# Patient Record
Sex: Female | Born: 1935 | Race: White | Hispanic: No | Smoking: Never smoker
Health system: Southern US, Community
[De-identification: ages and names within clinical notes are randomized; demographics above are authoritative.]

## PROBLEM LIST (undated history)

## (undated) DIAGNOSIS — C9000 Multiple myeloma not having achieved remission: Secondary | ICD-10-CM

## (undated) DIAGNOSIS — M5136 Other intervertebral disc degeneration, lumbar region: Secondary | ICD-10-CM

## (undated) DIAGNOSIS — M353 Polymyalgia rheumatica: Secondary | ICD-10-CM

## (undated) DIAGNOSIS — I10 Essential (primary) hypertension: Secondary | ICD-10-CM

## (undated) DIAGNOSIS — M51369 Other intervertebral disc degeneration, lumbar region without mention of lumbar back pain or lower extremity pain: Secondary | ICD-10-CM

## (undated) HISTORY — DX: Other intervertebral disc degeneration, lumbar region: M51.36

## (undated) HISTORY — DX: Essential (primary) hypertension: I10

## (undated) HISTORY — DX: Other intervertebral disc degeneration, lumbar region without mention of lumbar back pain or lower extremity pain: M51.369

## (undated) HISTORY — PX: HEMORRHOID SURGERY: SHX153

## (undated) HISTORY — DX: Multiple myeloma not having achieved remission: C90.00

## (undated) HISTORY — DX: Polymyalgia rheumatica: M35.3

---

## 2000-03-18 ENCOUNTER — Encounter: Payer: Self-pay | Admitting: Family Medicine

## 2000-03-18 ENCOUNTER — Ambulatory Visit (HOSPITAL_COMMUNITY): Admission: RE | Admit: 2000-03-18 | Discharge: 2000-03-18 | Payer: Self-pay | Admitting: Family Medicine

## 2002-03-27 ENCOUNTER — Emergency Department (HOSPITAL_COMMUNITY): Admission: EM | Admit: 2002-03-27 | Discharge: 2002-03-27 | Payer: Self-pay | Admitting: Emergency Medicine

## 2004-02-28 ENCOUNTER — Encounter: Admission: RE | Admit: 2004-02-28 | Discharge: 2004-02-28 | Payer: Self-pay | Admitting: Family Medicine

## 2005-08-22 ENCOUNTER — Ambulatory Visit (HOSPITAL_COMMUNITY): Admission: RE | Admit: 2005-08-22 | Discharge: 2005-08-22 | Payer: Self-pay | Admitting: Family Medicine

## 2006-12-10 ENCOUNTER — Ambulatory Visit (HOSPITAL_COMMUNITY): Admission: RE | Admit: 2006-12-10 | Discharge: 2006-12-10 | Payer: Self-pay | Admitting: General Surgery

## 2006-12-10 ENCOUNTER — Encounter (INDEPENDENT_AMBULATORY_CARE_PROVIDER_SITE_OTHER): Payer: Self-pay | Admitting: Specialist

## 2007-11-02 ENCOUNTER — Encounter: Admission: RE | Admit: 2007-11-02 | Discharge: 2007-11-02 | Payer: Self-pay | Admitting: Family Medicine

## 2010-02-03 ENCOUNTER — Emergency Department (HOSPITAL_COMMUNITY): Admission: EM | Admit: 2010-02-03 | Discharge: 2010-02-03 | Payer: Self-pay | Admitting: Emergency Medicine

## 2010-02-18 ENCOUNTER — Ambulatory Visit (HOSPITAL_COMMUNITY): Admission: RE | Admit: 2010-02-18 | Discharge: 2010-02-18 | Payer: Self-pay | Admitting: Family Medicine

## 2010-05-28 ENCOUNTER — Emergency Department (HOSPITAL_COMMUNITY): Admission: EM | Admit: 2010-05-28 | Discharge: 2010-05-28 | Payer: Self-pay | Admitting: Emergency Medicine

## 2010-08-16 ENCOUNTER — Emergency Department (HOSPITAL_COMMUNITY): Admission: EM | Admit: 2010-08-16 | Discharge: 2010-08-16 | Payer: Self-pay | Admitting: Emergency Medicine

## 2010-11-11 ENCOUNTER — Emergency Department (HOSPITAL_COMMUNITY)
Admission: EM | Admit: 2010-11-11 | Discharge: 2010-11-11 | Payer: Self-pay | Source: Home / Self Care | Admitting: Emergency Medicine

## 2011-01-13 ENCOUNTER — Other Ambulatory Visit: Payer: Self-pay | Admitting: Family Medicine

## 2011-01-13 DIAGNOSIS — M545 Low back pain: Secondary | ICD-10-CM

## 2011-01-13 DIAGNOSIS — R509 Fever, unspecified: Secondary | ICD-10-CM

## 2011-01-15 ENCOUNTER — Ambulatory Visit (HOSPITAL_COMMUNITY)
Admission: RE | Admit: 2011-01-15 | Discharge: 2011-01-15 | Disposition: A | Discharge status: Home / Self Care | Payer: No Typology Code available for payment source | Source: Ambulatory Visit | Attending: Family Medicine | Admitting: Family Medicine

## 2011-01-15 ENCOUNTER — Other Ambulatory Visit: Payer: Self-pay | Admitting: Family Medicine

## 2011-01-15 DIAGNOSIS — M5126 Other intervertebral disc displacement, lumbar region: Secondary | ICD-10-CM | POA: Insufficient documentation

## 2011-01-15 DIAGNOSIS — M545 Low back pain, unspecified: Secondary | ICD-10-CM | POA: Insufficient documentation

## 2011-01-15 DIAGNOSIS — M79609 Pain in unspecified limb: Secondary | ICD-10-CM | POA: Insufficient documentation

## 2011-01-15 DIAGNOSIS — M5124 Other intervertebral disc displacement, thoracic region: Secondary | ICD-10-CM | POA: Insufficient documentation

## 2011-01-15 DIAGNOSIS — R509 Fever, unspecified: Secondary | ICD-10-CM

## 2011-01-30 LAB — URINALYSIS, ROUTINE W REFLEX MICROSCOPIC
Glucose, UA: NEGATIVE mg/dL
Hgb urine dipstick: NEGATIVE
Nitrite: NEGATIVE
Specific Gravity, Urine: 1.02 (ref 1.005–1.030)

## 2011-01-30 LAB — BASIC METABOLIC PANEL
BUN: 14 mg/dL (ref 6–23)
Calcium: 9.4 mg/dL (ref 8.4–10.5)
Chloride: 102 mEq/L (ref 96–112)
Creatinine, Ser: 1.03 mg/dL (ref 0.4–1.2)
GFR calc Af Amer: >60 mL/min (ref >60)
Glucose, Bld: 116 mg/dL — ABNORMAL HIGH (ref 70–99)
Potassium: 3.2 mEq/L — ABNORMAL LOW (ref 3.5–5.1)
Sodium: 141 mEq/L (ref 135–145)

## 2011-01-30 LAB — DIFFERENTIAL
Basophils Absolute: 0 10*3/uL (ref 0.0–0.1)
Eosinophils Relative: 3 % (ref 0–5)
Lymphs Abs: 1.7 10*3/uL (ref 0.7–4.0)
Monocytes Relative: 10 % (ref 3–12)
Neutro Abs: 1.7 10*3/uL (ref 1.7–7.7)

## 2011-01-30 LAB — CBC
HCT: 35.7 % — ABNORMAL LOW (ref 36.0–46.0)
Hemoglobin: 12.2 g/dL (ref 12.0–15.0)
MCV: 91.5 fL (ref 78.0–100.0)
RBC: 3.91 MIL/uL (ref 3.87–5.11)

## 2011-01-30 LAB — HEPATIC FUNCTION PANEL
AST: 16 U/L (ref 0–37)
Alkaline Phosphatase: 71 U/L (ref 39–117)

## 2011-02-10 LAB — COMPREHENSIVE METABOLIC PANEL
ALT: 21 U/L (ref 0–35)
AST: 26 U/L (ref 0–37)
Albumin: 3.8 g/dL (ref 3.5–5.2)
Calcium: 8.9 mg/dL (ref 8.4–10.5)
Chloride: 99 mEq/L (ref 96–112)
Creatinine, Ser: 0.95 mg/dL (ref 0.4–1.2)
GFR calc Af Amer: >60 mL/min (ref >60)
GFR calc non Af Amer: 58 mL/min — ABNORMAL LOW (ref >60)
Glucose, Bld: 122 mg/dL — ABNORMAL HIGH (ref 70–99)
Potassium: 2.8 mEq/L — ABNORMAL LOW (ref 3.5–5.1)
Sodium: 138 mEq/L (ref 135–145)

## 2011-02-10 LAB — CBC
HCT: 31.4 % — ABNORMAL LOW (ref 36.0–46.0)
MCHC: 34.4 g/dL (ref 30.0–36.0)
MCV: 90.1 fL (ref 78.0–100.0)
Platelets: 186 10*3/uL (ref 150–400)
RBC: 3.48 MIL/uL — ABNORMAL LOW (ref 3.87–5.11)
RDW: 15.6 % — ABNORMAL HIGH (ref 11.5–15.5)

## 2011-02-10 LAB — DIFFERENTIAL
Eosinophils Absolute: 0.3 10*3/uL (ref 0.0–0.7)
Neutro Abs: 3 10*3/uL (ref 1.7–7.7)
Neutrophils Relative %: 53 % (ref 43–77)

## 2011-04-04 NOTE — Op Note (Signed)
Kimberly Friedman, Kimberly Friedman                ACCOUNT NO.:  [id number]   MEDICAL RECORD NO.:  [id number]          PATIENT TYPE:  AMB   LOCATION:  DAY                          FACILITY:  [facility]   PHYSICIAN:  Adolph Pollack, M.D.DATE OF BIRTH:  July 29, 1936   DATE OF PROCEDURE:  12/10/2006  DATE OF DISCHARGE:                               OPERATIVE REPORT   PREOPERATIVE DIAGNOSIS:  Hemorrhoids, internal and external.   POSTOPERATIVE DIAGNOSIS:  Hemorrhoids, internal and external.   PROCEDURE:  Hemorrhoidectomy.   SURGEON:  Adolph Pollack, M.D.   ANESTHESIA:  General plus local consisting of Marcaine and [place].   INDICATIONS:  The patient is a 75 year old female who has been suffering  from hemorrhoidal problems for about 50 years, but the symptoms have  increased.  She has been having some intermittent bleeding, but has had  none since September.  However, she has swelling and pain from the  hemorrhoids, especially after a bowel movement.  She has tried multiple  medications and this has not helped her.  She now presents for  hemorrhoidectomy.  We discussed the procedure the risks extensively  preoperatively.   TECHNIQUE:  She was seen in the holding area then brought to the  operating room, placed supine on the operating table and a general  anesthetic was administered.  She had then placed the lithotomy  position.  The perianal area was sterilely prepped and draped.  She had  diffuse hemorrhoidal disease involving the left lateral side, right  anterior lateral and right posterior lateral side as well as a little  bit of the left anterior side.  I first directed my attention to the  left lateral area.   The left lateral external hemorrhoid was grasped with hemostat and  retracted.  I could see the internal component.  I placed a stitch  through the hemorrhoidal vessel plexus internally of 3-0 chromic.  I  then marked the area of incision with electrocautery and injected the  local anesthetic solution subcutaneously.  I then made an incision  sharply around the anal mucosa and anoderm and stripped the muscle away.  The Harmonic scalpel was then used to divide the hemorrhoid and excise  it.  A few bleeding points were noted.  This was controlled with  electrocautery.  I then approximated the anal mucosa and skin with a  running locking 3-0 chromic suture.   Next, I approached the right anterolateral hemorrhoid, grasped the  external component and pulled it laterally exposing the internal  component.  I placed a stitch at the hemorrhoidal plexus  of 3-0  chromic.  I then marked the area of incision using electrocautery and  then injected local anesthetic subcutaneously.  I made a sharp incision  around the hemorrhoid then stripped the muscle away from the hemorrhoid.  I then excised the hemorrhoid using Harmonic scalpel.  Bleeding was  controlled with electrocautery and I reapproximated the anal mucosa and  the skin with a running locking 3-0 chromic suture.   Lastly, I approached the right posterior lateral hemorrhoid and grasped  its external component and retracted  it laterally exposing the internal  component.  I placed a 3-0 chromic stitch across the hemorrhoidal plexus  and tied it down.  I then marked the area of incision with  electrocautery and injected local anesthetic subcutaneously.  I began  sharp excision of the hemorrhoid then stripped the underlying muscle  away and completed the excision of the hemorrhoid using Harmonic  scalpel.  Bleeding was controlled with electrocautery.  I reapproximated  the anal mucosa and skin with a running locking 3-0 chromic suture.   I inspected the area and it looked much better.  There is still some  swelling in the left anterior lateral area, however, I felt that I had  done enough at this time.  I did not detect anal stenosis.  There is a  little bit of bleeding near the suture line which I controlled with   electrocautery and I waited and inspected this and it was hemostatic.  Gelfoam was placed in the anal canal.  I then performed a perianal block  using the local anesthetic solution.  A bulky dressing was applied.   She tolerated the procedure well without any apparent complications.  She was taken to recovery room in satisfactory condition.      Adolph Pollack, M.D.  Electronically Signed     TJR/MEDQ  D:  12/10/2006  T:  12/10/2006  Job:  [id number]

## 2012-07-22 ENCOUNTER — Ambulatory Visit (HOSPITAL_COMMUNITY)
Admission: RE | Admit: 2012-07-22 | Discharge: 2012-07-22 | Disposition: A | Discharge status: Home / Self Care | Payer: PRIVATE HEALTH INSURANCE | Source: Ambulatory Visit | Attending: Family Medicine | Admitting: Family Medicine

## 2012-07-22 ENCOUNTER — Other Ambulatory Visit: Payer: Self-pay | Admitting: Family Medicine

## 2012-07-22 DIAGNOSIS — M79609 Pain in unspecified limb: Secondary | ICD-10-CM | POA: Insufficient documentation

## 2012-07-22 DIAGNOSIS — M545 Low back pain, unspecified: Secondary | ICD-10-CM

## 2012-07-22 DIAGNOSIS — M47817 Spondylosis without myelopathy or radiculopathy, lumbosacral region: Secondary | ICD-10-CM | POA: Insufficient documentation

## 2012-07-22 DIAGNOSIS — I700 Atherosclerosis of aorta: Secondary | ICD-10-CM | POA: Insufficient documentation

## 2012-07-22 DIAGNOSIS — M4180 Other forms of scoliosis, site unspecified: Secondary | ICD-10-CM | POA: Insufficient documentation

## 2013-01-25 ENCOUNTER — Ambulatory Visit (HOSPITAL_COMMUNITY)
Admission: RE | Admit: 2013-01-25 | Discharge: 2013-01-25 | Disposition: A | Discharge status: Home / Self Care | Payer: Medicare PPO | Source: Ambulatory Visit | Attending: Physical Medicine and Rehabilitation | Admitting: Physical Medicine and Rehabilitation

## 2013-01-25 DIAGNOSIS — M6281 Muscle weakness (generalized): Secondary | ICD-10-CM | POA: Insufficient documentation

## 2013-01-25 DIAGNOSIS — R29898 Other symptoms and signs involving the musculoskeletal system: Secondary | ICD-10-CM | POA: Insufficient documentation

## 2013-01-25 DIAGNOSIS — IMO0001 Reserved for inherently not codable concepts without codable children: Secondary | ICD-10-CM | POA: Insufficient documentation

## 2013-01-25 DIAGNOSIS — R262 Difficulty in walking, not elsewhere classified: Secondary | ICD-10-CM | POA: Insufficient documentation

## 2013-01-25 NOTE — Evaluation (Signed)
Physical Therapy Evaluation  Patient Details  Name: PEACHIE BARKALOW MRN: [id number] Date of Birth: 10-21-1936  Today's Date: 01/25/2013 Time: 1400-1440 Charge:  Eval Visit#: 1 of 8  Re-eval: 02/24/13 Assessment Diagnosis: lumbar stenosis/ DDD Prior Therapy: none  Authorization: pyramid today     Authorization Visit#: 1 of 8   Past Medical History: No past medical history on file. Past Surgical History: No past surgical history on file.  Subjective Symptoms/Limitations Symptoms: Ms. Arika states that she has had LBP B that radiates down to her feet.  She states that the pain stays all day long.  What concerned her the most is that she fell due to her fall.  She states that she has been referred to try and improve her pain. How long can you sit comfortably?: Pt states her pain is not changed by sitting How long can you stand comfortably?: The patient states that she can stand for about 5-10 minutes How long can you walk comfortably?: The patient has increased pain after about 5 minutes. Pain Assessment Currently in Pain?: Yes Pain Score: 10-Worst pain ever Pain Location: Back Pain Orientation: Right    Sensation/Coordination/Flexibility/Functional Tests Functional Tests Functional Tests: Oswestry test  33/50  Assessment RLE Strength Right Hip Flexion: 4/5 Right Hip Extension: 3+/5 Right Hip ABduction: 4/5 Right Knee Flexion: 5/5 Right Knee Extension: 3/5 Right Ankle Dorsiflexion: 3+/5 LLE Strength Left Hip Flexion: 4/5 Left Hip Extension: 3+/5 Left Hip ABduction: 4/5 Left Knee Flexion: 5/5 Left Knee Extension: 3/5 Left Ankle Dorsiflexion: 3+/5 Lumbar AROM Lumbar Flexion: WFL pain going down Lumbar Extension: decreased 205 Lumbar - Right Side Bend: wfl Lumbar - Left Side Bend: wfl Lumbar - Right Rotation: wfl Lumbar - Left Rotation: wfl Palpation Palpation: tight paraspinal mm  Exercise/Treatments Mobility/Balance  Posture/Postural  Control Posture/Postural Control: Postural limitations Postural Limitations: increased kyphosis decreased lordosis.   Stretches Single Knee to Chest Stretch: 2 reps;30 seconds   Supine Ab Set: 10 reps Bridge: 10 reps    Physical Therapy Assessment and Plan PT Assessment and Plan Clinical Impression Statement: Pt with tight paraspinal mm; decreased LE strength and radicular pain that has decreased pt functioning level.  Pt will benefit from skilled PT to improve body mechanic awareness, educate in exercise to decrease pain and improve pt's quality of life. Pt will benefit from skilled therapeutic intervention in order to improve on the following deficits: Decreased activity tolerance;Decreased strength;Difficulty walking;Pain PT Plan: begin [place] next treatment to B paraspinal mm; bent knee lift, active ham stretch, and sitting heelsqueeze/toe    Goals Home Exercise Program Pt will Perform Home Exercise Program: Independently PT Short Term Goals PT Short Term Goal 1: Pain decreased to no greater than a 6/10 PT Short Term Goal 2: Pt to be able to sit for 30 min without pain. PT Long Term Goals Time to Complete Long Term Goals: 4 weeks PT Long Term Goal 1: I in advance HEP to allow strengthening to wnl PT Long Term Goal 1 - Progress: Progressing toward goal PT Long Term Goal 2: Pain no greater than a 4/10 80% of the time Long Term Goal 3: pt able to shop for 40 minutes without increased pain Long Term Goal 4: Oswestry decrreased by 10 points.  Problem List Patient Active Problem List  Diagnosis  . Bilateral leg weakness  . Difficulty in walking    PT - End of Session Activity Tolerance: Patient tolerated treatment well General Behavior During Session: [facility] for tasks performed Cognition: [facility] for tasks  performed PT Plan of Care PT Home Exercise Plan: given  GP Functional Assessment Tool Used: oswestry Functional Limitation: Mobility: Walking and moving around;Self care Self  Care Current Status [phone]): At least 60 percent but less than 80 percent impaired, limited or restricted Self Care Goal Status ([id number]): At least 20 percent but less than 40 percent impaired, limited or restricted  RUSSELL,CINDY 01/25/2013, 3:05 PM  Physician Documentation Your signature is required to indicate approval of the treatment plan as stated above.  Please sign and either send electronically or make a copy of this report for your files and return this physician signed original.   Please mark one 1.__approve of plan  2. ___approve of plan with the following conditions.   ______________________________                                                          _____________________ Physician Signature                                                                                                             Date

## 2013-01-27 ENCOUNTER — Ambulatory Visit (HOSPITAL_COMMUNITY)
Admission: RE | Admit: 2013-01-27 | Discharge: 2013-01-27 | Disposition: A | Discharge status: Home / Self Care | Payer: Medicare PPO | Source: Ambulatory Visit | Attending: Family Medicine | Admitting: Family Medicine

## 2013-01-27 NOTE — Progress Notes (Signed)
Physical Therapy Treatment Patient Details  Name: Kimberly Friedman MRN: [id number] Date of Birth: [birth year]  Today's Date: 01/27/2013 Time: 8469-6295 PT Time Calculation (min): 30 min  Visit#: 2 of 8  Re-eval: 02/24/13 Charges: Therex x 20' [place] x 8'  Authorization: pyramid today  Authorization Visit#: 2 of 8   Subjective: Symptoms/Limitations Symptoms: Pt reprots no pain currently. Pain comes and goes and sometimes reaches 10/10. Pain Assessment Currently in Pain?: No/denies   Exercise/Treatments Supine Ab Set: 10 reps Bent Knee Raise: 10 reps Bridge: 10 reps  Modalities Modalities: Ultrasound Ultrasound Ultrasound Location: Bil lumbar paraspinals Ultrasound Parameters: 8' (4' each side) 1.2 w/cm2 1 MHz continuous  Physical Therapy Assessment and Plan PT Assessment and Plan Clinical Impression Statement: Tx limited by time as pt was late for appt. Pt completes therex very slow and controlled. Pt requires multimodal cueing to facilitate core contraction with stabilization exercises. [place] completed to bil lumbar paraspinals to decrease pain and tightness. PT Plan: Begin active hamstring stretch and seated hip roll in/out next session.     Problem List Patient Active Problem List  Diagnosis  . Bilateral leg weakness  . Difficulty in walking    PT - End of Session Activity Tolerance: Patient tolerated treatment well General Behavior During Session: [facility] for tasks performed Cognition: [facility] for tasks performed  GP Functional Assessment Tool Used: Bertis Ruddy, PTA 01/27/2013, 5:11 PM

## 2013-02-01 ENCOUNTER — Ambulatory Visit (HOSPITAL_COMMUNITY): Payer: No Typology Code available for payment source | Admitting: *Deleted

## 2013-02-03 ENCOUNTER — Inpatient Hospital Stay (HOSPITAL_COMMUNITY)
Admission: RE | Admit: 2013-02-03 | Payer: No Typology Code available for payment source | Source: Ambulatory Visit | Admitting: *Deleted

## 2013-02-08 ENCOUNTER — Inpatient Hospital Stay (HOSPITAL_COMMUNITY)
Admission: RE | Admit: 2013-02-08 | Payer: No Typology Code available for payment source | Source: Ambulatory Visit | Admitting: Physical Therapy

## 2013-02-10 ENCOUNTER — Ambulatory Visit (HOSPITAL_COMMUNITY): Payer: No Typology Code available for payment source | Admitting: Physical Therapy

## 2013-02-15 ENCOUNTER — Inpatient Hospital Stay (HOSPITAL_COMMUNITY)
Admission: RE | Admit: 2013-02-15 | Payer: No Typology Code available for payment source | Source: Ambulatory Visit | Admitting: *Deleted

## 2013-02-17 ENCOUNTER — Ambulatory Visit (HOSPITAL_COMMUNITY): Payer: No Typology Code available for payment source | Admitting: Physical Therapy

## 2013-02-22 ENCOUNTER — Ambulatory Visit (HOSPITAL_COMMUNITY): Payer: Medicare PPO | Admitting: Physical Therapy

## 2013-03-21 ENCOUNTER — Encounter: Payer: Self-pay | Admitting: Family Medicine

## 2013-03-21 ENCOUNTER — Ambulatory Visit (INDEPENDENT_AMBULATORY_CARE_PROVIDER_SITE_OTHER): Payer: Medicare PPO | Admitting: Family Medicine

## 2013-03-21 VITALS — BP 136/78 | HR 74 | Temp 98.3°F | Resp 16 | Wt 164.0 lb

## 2013-03-21 DIAGNOSIS — M353 Polymyalgia rheumatica: Secondary | ICD-10-CM | POA: Insufficient documentation

## 2013-03-21 DIAGNOSIS — J309 Allergic rhinitis, unspecified: Secondary | ICD-10-CM

## 2013-03-21 DIAGNOSIS — I10 Essential (primary) hypertension: Secondary | ICD-10-CM | POA: Insufficient documentation

## 2013-03-21 DIAGNOSIS — M545 Low back pain: Secondary | ICD-10-CM

## 2013-03-21 DIAGNOSIS — R634 Abnormal weight loss: Secondary | ICD-10-CM

## 2013-03-21 DIAGNOSIS — M5136 Other intervertebral disc degeneration, lumbar region: Secondary | ICD-10-CM | POA: Insufficient documentation

## 2013-03-21 MED ORDER — FLUTICASONE PROPIONATE 50 MCG/ACT NA SUSP: 2.0000 | Freq: Every day | NASAL | Status: DC

## 2013-03-21 MED ORDER — MELOXICAM 15 MG PO TABS: 15.0000 mg | ORAL_TABLET | Freq: Every day | ORAL | Status: DC

## 2013-03-21 NOTE — Progress Notes (Signed)
Subjective:    Patient ID: Kimberly Friedman, female    DOB: 24-Jul-1936, 77 y.o.   MRN: [id number]  HPI Very pleasant 77 year old female here for followup. At her office visit in February she is complaining of pain in her lower back and radiating into both hips and thighs an MRI from 2000 also by foraminal stenosis at L4 and L5 with moderate central canal stenosis is also present at L3 and L4. I did CT the patient for PMR for 6-10 months of using prednisone.  She no longer complains of pain in her shoulders or into her buttocks. She has minimal hip pain. She has constant daily pain in her lumbar spine. Given those findings I recommended she see orthopedics for possible epidural steroid injection. They tried 2 weeks of physical therapy, but the patient's pain persists.  She's also having postnasal drip and sore throat x3 weeks. She has lost 9 pounds since I discontinued the prednisone. That is likely due to stopping the prednisone, however the patient also reports nausea times and lack of appetite. She denies any fevers or chills. Past Medical History  Diagnosis Date  . PMR (polymyalgia rheumatica)   . DDD (degenerative disc disease), lumbar   . Hypertension    No current outpatient prescriptions on file prior to visit.   No current facility-administered medications on file prior to visit.   Allergies  Allergen Reactions  . Pravastatin     Myalgia's  . Zocor (Simvastatin)     Myalgia's   History   Social History  . Marital Status: Widowed    Spouse Name: N/A    Number of Children: N/A  . Years of Education: N/A   Occupational History  . Not on file.   Social History Main Topics  . Smoking status: Never Smoker   . Smokeless tobacco: Not on file  . Alcohol Use: Not on file  . Drug Use: Not on file  . Sexually Active: Not on file   Other Topics Concern  . Not on file   Social History Narrative  . No narrative on file      Review of Systems    review of systems is  otherwise neg Objective:   Physical Exam  Constitutional: She appears well-developed and well-nourished.  HENT:  Head: Normocephalic.  Right Ear: External ear normal.  Left Ear: External ear normal.  Nose: Nose normal.  Mouth/Throat: Oropharynx is clear and moist.  Eyes: Conjunctivae are normal. Pupils are equal, round, and reactive to light.  Neck: Normal range of motion. Neck supple. No JVD present. No thyromegaly present.  Cardiovascular: Normal rate, regular rhythm and normal heart sounds.   Pulmonary/Chest: Effort normal and breath sounds normal. No respiratory distress.  Abdominal: Soft. Bowel sounds are normal. She exhibits no distension. There is no tenderness.          Assessment & Plan:  1. Loss of weight The majority of this is due to discontinuation of prednisone. We'll check some baseline labs to rule out metabolic causes of weight loss. - Basic Metabolic Panel - CBC with Differential - TSH #2 low back pain. I recommended they contact Dr. Ethelene Hal to discuss possible epidural steroid injections. The patient's widespread pain is likely due to PMR. However this is quiescent at the present time and does not require prednisone therapy. And 2 she can arrange follow with Dr. Ethelene Hal, I will give her MOBIC 15 mg by mouth daily. We discussed possible gastrointestinal side effects as well as increased cardiovascular  risk. #3 sore throat-this is likely due to postnasal drip from allergies. Prescribed Flonase 2 sprays each nostril daily. There is no evidence of infection on her exam. Followup in one to 2 weeks if no better.

## 2013-03-22 LAB — CBC WITH DIFFERENTIAL/PLATELET
Basophils Relative: 1 % (ref 0–1)
HCT: 35.7 % — ABNORMAL LOW (ref 36.0–46.0)
Lymphocytes Relative: 57 % — ABNORMAL HIGH (ref 12–46)
Lymphs Abs: 2.2 10*3/uL (ref 0.7–4.0)
MCH: 31 pg (ref 26.0–34.0)
MCHC: 33.6 g/dL (ref 30.0–36.0)
Platelets: 156 10*3/uL (ref 150–400)
WBC: 3.9 10*3/uL — ABNORMAL LOW (ref 4.0–10.5)

## 2013-03-22 LAB — TSH: TSH: 1.828 u[IU]/mL (ref 0.350–4.500)

## 2013-03-22 LAB — BASIC METABOLIC PANEL
BUN: 13 mg/dL (ref 6–23)
Chloride: 102 mEq/L (ref 96–112)
Glucose, Bld: 93 mg/dL (ref 70–99)
Sodium: 141 mEq/L (ref 135–145)

## 2013-06-21 ENCOUNTER — Telehealth: Payer: Self-pay | Admitting: Family Medicine

## 2013-06-21 MED ORDER — LISINOPRIL 40 MG PO TABS: 40.0000 mg | ORAL_TABLET | Freq: Every day | ORAL | Status: DC

## 2013-06-21 MED ORDER — HYDROCHLOROTHIAZIDE 25 MG PO TABS: 25.0000 mg | ORAL_TABLET | Freq: Every day | ORAL | Status: DC

## 2013-06-21 NOTE — Telephone Encounter (Signed)
Rx Refilled  

## 2013-07-07 ENCOUNTER — Ambulatory Visit (INDEPENDENT_AMBULATORY_CARE_PROVIDER_SITE_OTHER): Payer: Medicare PPO | Admitting: Family Medicine

## 2013-07-07 ENCOUNTER — Encounter: Payer: Self-pay | Admitting: Family Medicine

## 2013-07-07 VITALS — BP 120/78 | HR 98 | Temp 98.6°F | Resp 14 | Wt 148.0 lb

## 2013-07-07 DIAGNOSIS — R634 Abnormal weight loss: Secondary | ICD-10-CM

## 2013-07-07 DIAGNOSIS — Z1231 Encounter for screening mammogram for malignant neoplasm of breast: Secondary | ICD-10-CM

## 2013-07-07 DIAGNOSIS — R6881 Early satiety: Secondary | ICD-10-CM

## 2013-07-07 DIAGNOSIS — R000 Tachycardia, unspecified: Secondary | ICD-10-CM

## 2013-07-07 NOTE — Progress Notes (Signed)
Subjective:    Patient ID: Kimberly Friedman, female    DOB: [birth year], 77 y.o.   MRN: [id number]  HPI I last saw the patient in May 2014.  At that time she weighed 164 pounds. She has lost 16 pounds in the last 3 months. This makes 25 pounds total this year. She denies any fever, abdominal pain, chest pain, cough, shortness of breath, melena, hematochezia, dysuria or hematuria.  She reports early satiety.  She can only a few bites before she feels full. She also reports profound fatigue and weakness. She has very little energy and no stamina. However she has no other symptoms. Past Medical History  Diagnosis Date  . PMR (polymyalgia rheumatica)   . DDD (degenerative disc disease), lumbar   . Hypertension    Current Outpatient Prescriptions on File Prior to Visit  Medication Sig Dispense Refill  . fish oil-omega-3 fatty acids 1000 MG capsule Take 1 g by mouth daily.      . fluticasone (FLONASE) 50 MCG/ACT nasal spray Place 2 sprays into the nose daily.  16 g  6  . hydrochlorothiazide (HYDRODIURIL) 25 MG tablet Take 1 tablet (25 mg total) by mouth daily.  30 tablet  3  . lisinopril (PRINIVIL,ZESTRIL) 40 MG tablet Take 1 tablet (40 mg total) by mouth daily.  30 tablet  3   No current facility-administered medications on file prior to visit.   Allergies  Allergen Reactions  . Pravastatin     Myalgia's  . Zocor [Simvastatin]     Myalgia's   History   Social History  . Marital Status: Widowed    Spouse Name: N/A    Number of Children: N/A  . Years of Education: N/A   Occupational History  . Not on file.   Social History Main Topics  . Smoking status: Never Smoker   . Smokeless tobacco: Not on file  . Alcohol Use: Not on file  . Drug Use: Not on file  . Sexual Activity: Not on file   Other Topics Concern  . Not on file   Social History Narrative  . No narrative on file   No family history on file.    Review of Systems  All other systems reviewed and are  negative.       Objective:   Physical Exam  Constitutional: She is oriented to person, place, and time. She appears well-developed and well-nourished.  HENT:  Right Ear: External ear normal.  Left Ear: External ear normal.  Nose: Nose normal.  Mouth/Throat: Oropharynx is clear and moist. No oropharyngeal exudate.  Eyes: Conjunctivae and EOM are normal. Pupils are equal, round, and reactive to light. No scleral icterus.  Neck: Neck supple. No JVD present. No thyromegaly present.  Cardiovascular: Regular rhythm and normal heart sounds.  Exam reveals no gallop and no friction rub.   No murmur heard. Pulmonary/Chest: Effort normal and breath sounds normal. No respiratory distress. She has no wheezes. She has no rales. She exhibits no tenderness.  Abdominal: Soft. Bowel sounds are normal. She exhibits no distension and no mass. There is no tenderness. There is no rebound and no guarding.  Musculoskeletal: She exhibits no edema.  Lymphadenopathy:    She has no cervical adenopathy.  Neurological: She is alert and oriented to person, place, and time. No cranial nerve deficit. She exhibits normal muscle tone. Coordination normal.  Skin: Skin is warm. No rash noted. No erythema. No pallor.  Psychiatric: She has a normal mood and affect. Her  behavior is normal. Judgment and thought content normal.   patient is tachycardic with a weak palpable pulse. EKG shows sinus tachycardia at 108 beats per minute with frequent PACs and a left anterior fascicular block. There is nonspecific ST changes in 1 and aVL but is otherwise normal.         Assessment & Plan:  1. Loss of weight  Patient's exam today shows a frail weak elderly female who has lost substantial weight over the last 3 months without any specific reason. I am mildly concerned about malignancy. Obtain a CT scan of the abdomen and pelvis with contrast, chest x-ray, mammogram, CBC, CMP, TSH, sedimentation rate. - CT Abdomen Pelvis W  Contrast; Future - CBC with Differential - COMPLETE METABOLIC PANEL WITH GFR - TSH - T4, free - Sedimentation rate - DG Chest 2 View; Future - MM Digital Screening; Future  2. Early satiety This is the cause of the weight loss.. Begin with the workup listed in problem #1. If this is normal I would next proceed with a GI consult for EGD and colonoscopy - CT Abdomen Pelvis W Contrast; Future  3. Tachycardia This is likely due to dehydration and poor intake. I will check a TSH. Meanwhile have the patient stop hydrochlorothiazide.  - TSH - T4, free

## 2013-07-08 LAB — COMPLETE METABOLIC PANEL WITH GFR
ALT: 8 U/L (ref 0–35)
Albumin: 4.3 g/dL (ref 3.5–5.2)
CO2: 27 mEq/L (ref 19–32)
Calcium: 9.6 mg/dL (ref 8.4–10.5)
Chloride: 100 mEq/L (ref 96–112)
GFR, Est African American: 76 mL/min
GFR, Est Non African American: 66 mL/min
Glucose, Bld: 105 mg/dL — ABNORMAL HIGH (ref 70–99)
Sodium: 138 mEq/L (ref 135–145)
Total Protein: 7.7 g/dL (ref 6.0–8.3)

## 2013-07-08 LAB — CBC WITH DIFFERENTIAL/PLATELET
Eosinophils Absolute: 0 10*3/uL (ref 0.0–0.7)
Hemoglobin: 12.9 g/dL (ref 12.0–15.0)
Lymphocytes Relative: 54 % — ABNORMAL HIGH (ref 12–46)
Lymphs Abs: 1.9 10*3/uL (ref 0.7–4.0)
MCH: 31.2 pg (ref 26.0–34.0)
MCV: 91.3 fL (ref 78.0–100.0)
Monocytes Relative: 8 % (ref 3–12)
Neutrophils Relative %: 36 % — ABNORMAL LOW (ref 43–77)
Platelets: 151 10*3/uL (ref 150–400)
RBC: 4.14 MIL/uL (ref 3.87–5.11)
WBC: 3.4 10*3/uL — ABNORMAL LOW (ref 4.0–10.5)

## 2013-07-08 NOTE — Addendum Note (Signed)
Addended by: WILLIS, SANDY B on: 07/08/2013 08:25 AM   Modules accepted: Orders  

## 2013-07-11 ENCOUNTER — Telehealth: Payer: Self-pay | Admitting: Family Medicine

## 2013-07-11 NOTE — Telephone Encounter (Signed)
When pt was in the other day she did not really understand what Dr. Tanya Nones was saying. Her sister normally comes in but she was out of town. Can you please the sister and let her know what is going on?

## 2013-07-12 NOTE — Telephone Encounter (Signed)
Please call Philipp Deputy [phone]

## 2013-08-02 ENCOUNTER — Ambulatory Visit
Admission: RE | Admit: 2013-08-02 | Discharge: 2013-08-02 | Disposition: A | Discharge status: Home / Self Care | Payer: Medicare PPO | Source: Ambulatory Visit | Attending: Family Medicine | Admitting: Family Medicine

## 2013-08-02 DIAGNOSIS — Z1231 Encounter for screening mammogram for malignant neoplasm of breast: Secondary | ICD-10-CM

## 2013-08-02 DIAGNOSIS — R634 Abnormal weight loss: Secondary | ICD-10-CM

## 2013-08-17 ENCOUNTER — Ambulatory Visit (HOSPITAL_COMMUNITY)
Admission: RE | Admit: 2013-08-17 | Discharge: 2013-08-17 | Disposition: A | Discharge status: Home / Self Care | Payer: Medicare PPO | Source: Ambulatory Visit | Attending: Family Medicine | Admitting: Family Medicine

## 2013-08-17 DIAGNOSIS — R634 Abnormal weight loss: Secondary | ICD-10-CM

## 2013-08-17 DIAGNOSIS — K5730 Diverticulosis of large intestine without perforation or abscess without bleeding: Secondary | ICD-10-CM | POA: Insufficient documentation

## 2013-08-17 DIAGNOSIS — R6881 Early satiety: Secondary | ICD-10-CM | POA: Insufficient documentation

## 2013-08-17 DIAGNOSIS — R933 Abnormal findings on diagnostic imaging of other parts of digestive tract: Secondary | ICD-10-CM | POA: Insufficient documentation

## 2013-08-17 MED ORDER — IOHEXOL 300 MG/ML  SOLN
100.0000 mL | Freq: Once | INTRAMUSCULAR | Status: AC | PRN
  Administered 2013-08-17: 100 mL via INTRAVENOUS

## 2013-09-01 NOTE — Progress Notes (Signed)
#   not working at this time

## 2013-10-09 ENCOUNTER — Encounter: Payer: Self-pay | Admitting: Family Medicine

## 2013-12-05 ENCOUNTER — Encounter: Payer: Self-pay | Admitting: Family Medicine

## 2013-12-05 ENCOUNTER — Ambulatory Visit (INDEPENDENT_AMBULATORY_CARE_PROVIDER_SITE_OTHER): Payer: Medicare PPO | Admitting: Family Medicine

## 2013-12-05 VITALS — BP 160/90 | HR 78 | Temp 97.0°F | Resp 18 | Wt 152.0 lb

## 2013-12-05 DIAGNOSIS — IMO0001 Reserved for inherently not codable concepts without codable children: Secondary | ICD-10-CM

## 2013-12-05 DIAGNOSIS — I10 Essential (primary) hypertension: Secondary | ICD-10-CM

## 2013-12-05 DIAGNOSIS — Z23 Encounter for immunization: Secondary | ICD-10-CM

## 2013-12-05 MED ORDER — HYDROCHLOROTHIAZIDE 25 MG PO TABS: 25.0000 mg | ORAL_TABLET | Freq: Every day | ORAL | Status: DC

## 2013-12-05 MED ORDER — TRAMADOL HCL 50 MG PO TABS: 50.0000 mg | ORAL_TABLET | Freq: Four times a day (QID) | ORAL | Status: DC

## 2013-12-05 NOTE — Progress Notes (Signed)
Subjective:    Patient ID: Kimberly Friedman, female    DOB: 1936/02/19, 78 y.o.   MRN: [id number]  HPI  Patient is a very pleasant 78 year old African American female who presents today complaining of diffuse body pain. Is a very difficult history to obtain. It is hard to pin down the patient on exactly where she hurts. She continues to state and reiterate that hurts all over her body. The pain starts in her lower back and then proceeds up her back into her arms and down into her legs. The pain lasts a short period time but occurs frequently throughout the day. She has a past medical history of PMR as well as lumbar degenerative disc disease.  She received epidural steroid injections in the past which helped her low back pain. She has also taken  prednisone for PMR which seemed to help her pain in her shoulders and her hips. This pain however seems to be in her hands and her arms and her legs in her lower back. She is also supposed to be on lisinopril as well as hydrochlorothiazide for her blood pressure. She is only taking lisinopril and she takes it sporadically. She denies any chest pain, shortness of breath, dyspnea on exertion. Her blood pressure is elevated today at 160/90. Her last office visit she had lost significant weight 148 pounds. She's gained 4 pounds since the office visit. Mammogram, CT of the chest, CT the abdomen and pelvis were essentially benign. There was wall thickening in the distal colon but was otherwise normal.  She is due for a colonoscopy in 2016. Past Medical History  Diagnosis Date  . PMR (polymyalgia rheumatica)   . DDD (degenerative disc disease), lumbar   . Hypertension    No past surgical history on file. Current Outpatient Prescriptions on File Prior to Visit  Medication Sig Dispense Refill  . fish oil-omega-3 fatty acids 1000 MG capsule Take 1 g by mouth daily.      . fluticasone (FLONASE) 50 MCG/ACT nasal spray Place 2 sprays into the nose daily.  76 g  6  .  lisinopril (PRINIVIL,ZESTRIL) 40 MG tablet Take 1 tablet (40 mg total) by mouth daily.  30 tablet  3   No current facility-administered medications on file prior to visit.   Allergies  Allergen Reactions  . Pravastatin     Myalgia's  . Zocor [Simvastatin]     Myalgia's   History   Social History  . Marital Status: Widowed    Spouse Name: N/A    Number of Children: N/A  . Years of Education: N/A   Occupational History  . Not on file.   Social History Main Topics  . Smoking status: Never Smoker   . Smokeless tobacco: Not on file  . Alcohol Use: Not on file  . Drug Use: Not on file  . Sexual Activity: Not on file   Other Topics Concern  . Not on file   Social History Narrative  . No narrative on file     Review of Systems  All other systems reviewed and are negative.       Objective:   Physical Exam  Vitals reviewed. Constitutional: She appears well-developed and well-nourished. No distress.  HENT:  Mouth/Throat: Oropharynx is clear and moist.  Eyes: Conjunctivae are normal. No scleral icterus.  Neck: Neck supple. No JVD present. No thyromegaly present.  Cardiovascular: Normal rate, regular rhythm, normal heart sounds and intact distal pulses.  Exam reveals no gallop and  no friction rub.   No murmur heard. Pulmonary/Chest: Effort normal and breath sounds normal. No respiratory distress. She has no wheezes. She has no rales. She exhibits no tenderness.  Abdominal: Soft. Bowel sounds are normal. She exhibits no distension. There is no tenderness. There is no rebound and no guarding.  Musculoskeletal: She exhibits no edema and no tenderness.       Right shoulder: Normal.       Left shoulder: Normal.       Right elbow: Normal.      Left elbow: Normal.       Right knee: She exhibits normal range of motion and no effusion. No medial joint line and no lateral joint line tenderness noted.       Left knee: She exhibits normal range of motion and no effusion. No medial  joint line and no lateral joint line tenderness noted.       Lumbar back: She exhibits normal range of motion, no tenderness, no bony tenderness and no spasm.  Lymphadenopathy:    She has no cervical adenopathy.  Skin: She is not diaphoretic.          Assessment & Plan:  1. Need for prophylactic vaccination against Streptococcus pneumoniae (pneumococcus) - Pneumococcal conjugate vaccine 13-valent IM  2. HTN (hypertension) Continue lisinopril 40 mg by mouth daily. Add hydrochlorothiazide 25 mg by mouth daily. Recheck blood pressure in 2 weeks.  3. Myalgia and myositis I am unsure if the patient is complaining of low back pain with lumbar radiculopathy or possibly myalgias from PMR or possibly just simple aches and pain due to arthritis.  She is a very pleasant lady however it is difficult to obtain an accurate history to pin her down on what she means by her pain.  I will start the patient on tramadol 50 mg by mouth twice a day for the pain. I gave her 60 tablets 0 refills. I will see patient back in 2 weeks to reassess. If her pain persists on the tramadol, I would consider starting her on prednisone for possible PMR.

## 2013-12-19 ENCOUNTER — Ambulatory Visit (INDEPENDENT_AMBULATORY_CARE_PROVIDER_SITE_OTHER): Payer: Medicare PPO | Admitting: Family Medicine

## 2013-12-19 ENCOUNTER — Encounter: Payer: Self-pay | Admitting: Family Medicine

## 2013-12-19 VITALS — BP 122/82 | HR 78 | Temp 98.0°F | Resp 18 | Ht 63.0 in | Wt 149.0 lb

## 2013-12-19 DIAGNOSIS — I10 Essential (primary) hypertension: Secondary | ICD-10-CM

## 2013-12-19 DIAGNOSIS — M545 Low back pain, unspecified: Secondary | ICD-10-CM

## 2013-12-19 DIAGNOSIS — R634 Abnormal weight loss: Secondary | ICD-10-CM

## 2013-12-19 MED ORDER — HYDROCHLOROTHIAZIDE 25 MG PO TABS: 25.0000 mg | ORAL_TABLET | Freq: Every day | ORAL | Status: DC

## 2013-12-19 MED ORDER — LISINOPRIL 40 MG PO TABS: 40.0000 mg | ORAL_TABLET | Freq: Every day | ORAL | Status: DC

## 2013-12-19 NOTE — Progress Notes (Signed)
Subjective:    Patient ID: Kimberly Friedman, female    DOB: December 24, 1935, 78 y.o.   MRN: [id number]  HPI  12/05/13 Patient is a very pleasant 78 year old African American female who presents today complaining of diffuse body pain. Is a very difficult history to obtain. It is hard to pin down the patient on exactly where she hurts. She continues to state and reiterate that hurts all over her body. The pain starts in her lower back and then proceeds up her back into her arms and down into her legs. The pain lasts a short period time but occurs frequently throughout the day. She has a past medical history of PMR as well as lumbar degenerative disc disease.  She received epidural steroid injections in the past which helped her low back pain. She has also taken  prednisone for PMR which seemed to help her pain in her shoulders and her hips. This pain however seems to be in her hands and her arms and her legs in her lower back. She is also supposed to be on lisinopril as well as hydrochlorothiazide for her blood pressure. She is only taking lisinopril and she takes it sporadically. She denies any chest pain, shortness of breath, dyspnea on exertion. Her blood pressure is elevated today at 160/90. Her last office visit she had lost significant weight 148 pounds. She's gained 4 pounds since the office visit. Mammogram, CT of the chest, CT the abdomen and pelvis were essentially benign. There was wall thickening in the distal colon but was otherwise normal.  She is due for a colonoscopy in 2016.  At that time, my plan was:  At that time, my plan was: 1. Need for prophylactic vaccination against Streptococcus pneumoniae (pneumococcus) - Pneumococcal conjugate vaccine 13-valent IM  2. HTN (hypertension) Continue lisinopril 40 mg by mouth daily. Add hydrochlorothiazide 25 mg by mouth daily. Recheck blood pressure in 2 weeks.  3. Myalgia and myositis I am unsure if the patient is complaining of low back pain with  lumbar radiculopathy or possibly myalgias from PMR or possibly just simple aches and pain due to arthritis.  She is a very pleasant lady however it is difficult to obtain an accurate history to pin her down on what she means by her pain.  I will start the patient on tramadol 50 mg by mouth twice a day for the pain. I gave her 60 tablets 0 refills. I will see patient back in 2 weeks to reassess. If her pain persists on the tramadol, I would consider starting her on prednisone for possible PMR.  Patient is here today for followup. Her blood pressure is much better on the combination of lisinopril and hydrochlorothiazide. That her back is no longer bothering her. She occasionally takes a tramadol and feels much better since starting the pain medication. The pain pattern no other sounds like it may be PMR. She reports occasional pain in lower back but no pain in her hips or shoulders.  She is daily with her sister who states the patient continues to have occasional nausea, early satiety, and vomiting. I detailed workup and history of present illness at the last office visit. The next day but an ultrasound of the gallbladder as well as a GI consult for an EGD.  Past Medical History  Diagnosis Date  . PMR (polymyalgia rheumatica)   . DDD (degenerative disc disease), lumbar   . Hypertension    No past surgical history on file. Current Outpatient Prescriptions on  File Prior to Visit  Medication Sig Dispense Refill  . fish oil-omega-3 fatty acids 1000 MG capsule Take 1 g by mouth daily.      . fluticasone (FLONASE) 50 MCG/ACT nasal spray Place 2 sprays into the nose daily.  16 g  6  . traMADol (ULTRAM) 50 MG tablet Take 1 tablet (50 mg total) by mouth 4 (four) times daily.  60 tablet  1   No current facility-administered medications on file prior to visit.   Allergies  Allergen Reactions  . Pravastatin     Myalgia's  . Zocor [Simvastatin]     Myalgia's   History   Social History  . Marital  Status: Widowed    Spouse Name: N/A    Number of Children: N/A  . Years of Education: N/A   Occupational History  . Not on file.   Social History Main Topics  . Smoking status: Never Smoker   . Smokeless tobacco: Not on file  . Alcohol Use: Not on file  . Drug Use: Not on file  . Sexual Activity: Not on file   Other Topics Concern  . Not on file   Social History Narrative  . No narrative on file     Review of Systems  All other systems reviewed and are negative.       Objective:   Physical Exam  Vitals reviewed. Constitutional: She appears well-developed and well-nourished. No distress.  HENT:  Mouth/Throat: Oropharynx is clear and moist.  Eyes: Conjunctivae are normal. No scleral icterus.  Neck: Neck supple. No JVD present. No thyromegaly present.  Cardiovascular: Normal rate, regular rhythm, normal heart sounds and intact distal pulses.  Exam reveals no gallop and no friction rub.   No murmur heard. Pulmonary/Chest: Effort normal and breath sounds normal. No respiratory distress. She has no wheezes. She has no rales. She exhibits no tenderness.  Abdominal: Soft. Bowel sounds are normal. She exhibits no distension. There is no tenderness. There is no rebound and no guarding.  Musculoskeletal: She exhibits no edema and no tenderness.       Right shoulder: Normal.       Left shoulder: Normal.       Right elbow: Normal.      Left elbow: Normal.       Right knee: She exhibits normal range of motion and no effusion. No medial joint line and no lateral joint line tenderness noted.       Left knee: She exhibits normal range of motion and no effusion. No medial joint line and no lateral joint line tenderness noted.       Lumbar back: She exhibits normal range of motion, no tenderness, no bony tenderness and no spasm.  Lymphadenopathy:    She has no cervical adenopathy.  Skin: She is not diaphoretic.          Assessment & Plan:  1. HTN (hypertension) Continue  current medications at their present dosages - hydrochlorothiazide (HYDRODIURIL) 25 MG tablet; Take 1 tablet (25 mg total) by mouth daily.  Dispense: 30 tablet; Refill: 11 - lisinopril (PRINIVIL,ZESTRIL) 40 MG tablet; Take 1 tablet (40 mg total) by mouth daily.  Dispense: 30 tablet; Refill: 11  2. Loss of weight If patient continues to experience weight loss the next day would be a GI consultation for an EGD as well as a right upper quadrant ultrasound to evaluate for cholelithiasis. The patient is not having any right upper quadrant pain and therefore cholelithiasis is unlikely. Furthermore CT  scan of abdomen and pelvis do not mention any gallstones although this is not the ideal test for this. At the present time both the patient and her sister want to avoid a GI consultation and the ultrasound. They state they will call me back if symptoms worsen or persist for further evaluation.  3. Low back pain Continue to give the patient tramadol 50 mg every 6 hours as needed for low back pain.

## 2014-06-28 ENCOUNTER — Telehealth: Payer: Self-pay | Admitting: *Deleted

## 2014-06-28 NOTE — Telephone Encounter (Signed)
Received call from patient sister.   States that patient has been having increased nausea and vomiting for a few days.   States that she is not able to keep anything down, even ginger ale.   Requested MD to advise.   Call back number for Lelia [phone]

## 2014-06-28 NOTE — Telephone Encounter (Signed)
Based on her history and age I would send to ED, needs IV fluids, and anti-emetics

## 2014-06-28 NOTE — Telephone Encounter (Signed)
Call placed to patient sister Kimberly Friedman.   States that she has been able to keep some fluids down today, and she has not had any NV since this morning.   Advised to continue to encourage fluids like Gatorade or Pedialyte. If symptoms continue, patient should go to ER for evaluation.

## 2014-07-03 ENCOUNTER — Ambulatory Visit (INDEPENDENT_AMBULATORY_CARE_PROVIDER_SITE_OTHER): Payer: Medicare PPO | Admitting: Family Medicine

## 2014-07-03 ENCOUNTER — Encounter: Payer: Self-pay | Admitting: Family Medicine

## 2014-07-03 VITALS — BP 146/90 | HR 76 | Temp 98.1°F | Resp 14 | Ht 63.0 in | Wt 140.0 lb

## 2014-07-03 DIAGNOSIS — R634 Abnormal weight loss: Secondary | ICD-10-CM

## 2014-07-03 DIAGNOSIS — R350 Frequency of micturition: Secondary | ICD-10-CM

## 2014-07-03 DIAGNOSIS — M545 Low back pain, unspecified: Secondary | ICD-10-CM

## 2014-07-03 LAB — COMPLETE METABOLIC PANEL WITH GFR
ALK PHOS: 56 U/L (ref 39–117)
ALT: 12 U/L (ref 0–35)
AST: 18 U/L (ref 0–37)
Albumin: 4.1 g/dL (ref 3.5–5.2)
BILIRUBIN TOTAL: 0.5 mg/dL (ref 0.2–1.2)
BUN: 19 mg/dL (ref 6–23)
CO2: 24 meq/L (ref 19–32)
Calcium: 9 mg/dL (ref 8.4–10.5)
Chloride: 107 mEq/L (ref 96–112)
Creat: 0.71 mg/dL (ref 0.50–1.10)
GFR, EST NON AFRICAN AMERICAN: 84 mL/min
GLUCOSE: 101 mg/dL — AB (ref 70–99)
Potassium: 4 mEq/L (ref 3.5–5.3)
Sodium: 142 mEq/L (ref 135–145)
TOTAL PROTEIN: 7.7 g/dL (ref 6.0–8.3)

## 2014-07-03 LAB — URINALYSIS, ROUTINE W REFLEX MICROSCOPIC
BILIRUBIN URINE: NEGATIVE
Glucose, UA: NEGATIVE mg/dL
HGB URINE DIPSTICK: NEGATIVE
KETONES UR: NEGATIVE mg/dL
NITRITE: NEGATIVE
PH: 6 (ref 5.0–8.0)
Protein, ur: NEGATIVE mg/dL
Specific Gravity, Urine: <1.005 — ABNORMAL LOW (ref 1.005–1.030)
Urobilinogen, UA: 0.2 mg/dL (ref 0.0–1.0)

## 2014-07-03 LAB — CBC WITH DIFFERENTIAL/PLATELET
BASOS PCT: 1 % (ref 0–1)
Basophils Absolute: 0 10*3/uL (ref 0.0–0.1)
EOS ABS: 0.1 10*3/uL (ref 0.0–0.7)
EOS PCT: 2 % (ref 0–5)
HEMATOCRIT: 33.5 % — AB (ref 36.0–46.0)
HEMOGLOBIN: 11.4 g/dL — AB (ref 12.0–15.0)
LYMPHS ABS: 1.6 10*3/uL (ref 0.7–4.0)
Lymphocytes Relative: 53 % — ABNORMAL HIGH (ref 12–46)
MCH: 31.4 pg (ref 26.0–34.0)
MCHC: 34 g/dL (ref 30.0–36.0)
MCV: 92.3 fL (ref 78.0–100.0)
MONO ABS: 0.2 10*3/uL (ref 0.1–1.0)
MONOS PCT: 8 % (ref 3–12)
Neutro Abs: 1.1 10*3/uL — ABNORMAL LOW (ref 1.7–7.7)
Neutrophils Relative %: 36 % — ABNORMAL LOW (ref 43–77)
Platelets: 157 10*3/uL (ref 150–400)
RBC: 3.63 MIL/uL — AB (ref 3.87–5.11)
RDW: 16.1 % — ABNORMAL HIGH (ref 11.5–15.5)
WBC: 3 10*3/uL — ABNORMAL LOW (ref 4.0–10.5)

## 2014-07-03 LAB — URINALYSIS, MICROSCOPIC ONLY
CASTS: NONE SEEN
CRYSTALS: NONE SEEN
RBC / HPF: NEGATIVE RBC/hpf (ref <3)

## 2014-07-03 LAB — TSH: TSH: 1.362 u[IU]/mL (ref 0.350–4.500)

## 2014-07-03 LAB — SEDIMENTATION RATE: SED RATE: 38 mm/h — AB (ref 0–22)

## 2014-07-03 MED ORDER — HYDROCODONE-ACETAMINOPHEN 5-325 MG PO TABS: 1.0000 | ORAL_TABLET | Freq: Four times a day (QID) | ORAL | Status: DC | PRN

## 2014-07-03 NOTE — Progress Notes (Signed)
Subjective:    Patient ID: Kimberly Friedman, female    DOB: 03/06/1936, 78 y.o.   MRN: [id number]  HPI Patient has a history of chronic low back pain due to moderate spinal stenosis and DDD. MRI 2012 revealed: 1. L4-L5 grade 1 anterolisthesis with uncoverage of the disc and a  broad-based posterior protrusion. Moderate central and left  greater than right lateral recess stenosis. Mild to moderate left  greater than right bilateral foraminal stenosis is multifactorial.  2. L3-L4 moderate left foraminal stenosis secondary to bulging  disc and left greater than right facet hypertrophy. Mild to  moderate central stenosis.  3. L5-S1 broad-based posterior disc protrusion with mild narrowing  of the lateral recesses but no neural compression. Mild right  foraminal stenosis.  She also has a history of PMR but has been weaned completely off prednisone.    She had received ESI in 2014.  After the ESI, the pain improved.  Recently the pain returned.  The pain is located in the center of her lower back in approximately the level of L1-L2. It occurs several times a week. Other days she is pain free. She denies any sciatica. She denies any saddle anesthesia. She denies any signs or symptoms of cauda equina syndrome. She denies any leg weakness or leg paralysis. She does have occasional frequency. She denies dysuria. Denies hematuria. She denies pelvic pain. Urinalysis is significant only for trace leukocyte esterase. She has been using tramadol occasionally for lower back pain without any relief. She denies any myalgias consistent with PMR.   I have reviewed her most recent office visits and the patient continues to lose substantial weight. She underwent a workup for this approximately one year ago that was unrevealing. Otherwise she is asymptomatic Wt Readings from Last 3 Encounters:  07/03/14 140 lb (63.504 kg)  12/19/13 149 lb (67.586 kg)  12/05/13 152 lb (68.947 kg)   Past Medical History    Diagnosis Date  . PMR (polymyalgia rheumatica)   . DDD (degenerative disc disease), lumbar   . Hypertension    No past surgical history on file. Current Outpatient Prescriptions on File Prior to Visit  Medication Sig Dispense Refill  . fish oil-omega-3 fatty acids 1000 MG capsule Take 1 g by mouth daily.      . fluticasone (FLONASE) 50 MCG/ACT nasal spray Place 2 sprays into the nose daily.  16 g  6  . hydrochlorothiazide (HYDRODIURIL) 25 MG tablet Take 1 tablet (25 mg total) by mouth daily.  30 tablet  11  . lisinopril (PRINIVIL,ZESTRIL) 40 MG tablet Take 1 tablet (40 mg total) by mouth daily.  30 tablet  11  . traMADol (ULTRAM) 50 MG tablet Take 1 tablet (50 mg total) by mouth 4 (four) times daily.  60 tablet  1   No current facility-administered medications on file prior to visit.   Allergies  Allergen Reactions  . Pravastatin     Myalgia's  . Zocor [Simvastatin]     Myalgia's   History   Social History  . Marital Status: Widowed    Spouse Name: N/A    Number of Children: N/A  . Years of Education: N/A   Occupational History  . Not on file.   Social History Main Topics  . Smoking status: Never Smoker   . Smokeless tobacco: Not on file  . Alcohol Use: Not on file  . Drug Use: Not on file  . Sexual Activity: Not on file   Other Topics Concern  .  Not on file   Social History Narrative  . No narrative on file     Review of Systems  All other systems reviewed and are negative.      Objective:   Physical Exam  Vitals reviewed. Constitutional: She is oriented to person, place, and time. She appears well-developed and well-nourished.  Cardiovascular: Normal rate, regular rhythm and normal heart sounds.  Exam reveals no gallop and no friction rub.   No murmur heard. Pulmonary/Chest: Breath sounds normal. No respiratory distress. She has no wheezes. She has no rales.  Abdominal: Soft. Bowel sounds are normal. She exhibits no distension. There is no tenderness.  There is no rebound and no guarding.  Musculoskeletal:       Lumbar back: She exhibits decreased range of motion and pain. She exhibits no tenderness, no bony tenderness and no spasm.  Neurological: She is alert and oriented to person, place, and time. She has normal reflexes. She displays normal reflexes. No cranial nerve deficit. She exhibits normal muscle tone. Coordination normal.          Assessment & Plan:  1. Frequent urination Urinalysis is not striking for urinary tract infection. Her symptoms are not characteristic either. I will send a urine culture and treated if bacteruria is confirmed. - Urinalysis, Routine w reflex microscopic  2. Loss of weight Patient continues to lose substantial weight. I will check some screening lab work. Consider colonoscopy. I have previously performed mammogram, chest x-ray and abd/pelvic CT scans which were normal.  I recommended colonoscopy but patient did not follow up at that time (fall 2014). - CBC with Differential - COMPLETE METABOLIC PANEL WITH GFR - Sedimentation rate - TSH  3. Bilateral low back pain without sciatica Her back pain is due to her spinal stenosis and degenerative disc disease. I have recommended that the patient discontinue tramadol. I recommended she use Aleve 500 mg by mouth twice a day. She can use hydrocodone 5/325 one by mouth every 6 hours as needed for severe pain. - HYDROcodone-acetaminophen (NORCO/VICODIN) 5-325 MG per tablet; Take 1 tablet by mouth every 6 (six) hours as needed for moderate pain.  Dispense: 30 tablet; Refill: 0

## 2014-07-04 ENCOUNTER — Other Ambulatory Visit: Payer: Self-pay | Admitting: Family Medicine

## 2014-07-04 MED ORDER — CIPROFLOXACIN HCL 500 MG PO TABS: 500.0000 mg | ORAL_TABLET | Freq: Two times a day (BID) | ORAL | Status: DC

## 2014-07-05 LAB — URINE CULTURE

## 2014-08-17 ENCOUNTER — Encounter: Payer: Self-pay | Admitting: Family Medicine

## 2014-08-17 ENCOUNTER — Ambulatory Visit (INDEPENDENT_AMBULATORY_CARE_PROVIDER_SITE_OTHER): Payer: Medicare PPO | Admitting: Family Medicine

## 2014-08-17 VITALS — BP 152/90 | HR 64 | Temp 97.8°F | Resp 18 | Wt 142.0 lb

## 2014-08-17 DIAGNOSIS — M4806 Spinal stenosis, lumbar region: Secondary | ICD-10-CM

## 2014-08-17 DIAGNOSIS — M48061 Spinal stenosis, lumbar region without neurogenic claudication: Secondary | ICD-10-CM

## 2014-08-17 DIAGNOSIS — R350 Frequency of micturition: Secondary | ICD-10-CM

## 2014-08-17 DIAGNOSIS — I10 Essential (primary) hypertension: Secondary | ICD-10-CM

## 2014-08-17 LAB — URINALYSIS, ROUTINE W REFLEX MICROSCOPIC
Bilirubin Urine: NEGATIVE
Glucose, UA: NEGATIVE mg/dL
HGB URINE DIPSTICK: NEGATIVE
KETONES UR: NEGATIVE mg/dL
LEUKOCYTES UA: NEGATIVE
NITRITE: NEGATIVE
PH: 5.5 (ref 5.0–8.0)
Protein, ur: 100 mg/dL — AB
SPECIFIC GRAVITY, URINE: 1.025 (ref 1.005–1.030)
UROBILINOGEN UA: 0.2 mg/dL (ref 0.0–1.0)

## 2014-08-17 LAB — URINALYSIS, MICROSCOPIC ONLY
Crystals: NONE SEEN
RBC / HPF: NONE SEEN RBC/hpf (ref <3)

## 2014-08-17 MED ORDER — PREDNISONE 20 MG PO TABS: ORAL_TABLET | ORAL | Status: DC

## 2014-08-17 MED ORDER — AMLODIPINE BESYLATE 5 MG PO TABS: 5.0000 mg | ORAL_TABLET | Freq: Every day | ORAL | Status: DC

## 2014-08-17 NOTE — Progress Notes (Signed)
Subjective:    Patient ID: Kimberly Friedman, female    DOB: 10/29/1936, 78 y.o.   MRN: [id number]  HPI Patient is a very pleasant 78 year old African American female who has a history of moderate spinal stenosis discovered on an MRI of the lumbar spine obtained in 2012. She has previously undergone epidural steroid injections. Today she presents with low back pain that "radiates all over my body."  She is a very sweet lady but a difficult historian. She has very little insight into her medical condition but she is unable to answer specific questions. She repeats that the pain is in her back and radiates all over her body. She denies symptoms of cauda equina. It is difficult to determine if the pains that she describes in her legs are sciatica.  She also states that she has been having increased urinary frequency and. She denies dysuria or hematuria. She denies any fevers.  She states that she is taking both her hydrochlorothiazide and pulse and thrill. Her blood pressures elevated today at 152/90. In the past she has been noncompliant with her blood pressure medication. However today she states she is taking both. Past Medical History  Diagnosis Date  . PMR (polymyalgia rheumatica)   . DDD (degenerative disc disease), lumbar   . Hypertension    No past surgical history on file. Current Outpatient Prescriptions on File Prior to Visit  Medication Sig Dispense Refill  . fish oil-omega-3 fatty acids 1000 MG capsule Take 1 g by mouth daily.      . fluticasone (FLONASE) 50 MCG/ACT nasal spray Place 2 sprays into the nose daily.  16 g  6  . hydrochlorothiazide (HYDRODIURIL) 25 MG tablet Take 1 tablet (25 mg total) by mouth daily.  30 tablet  11  . lisinopril (PRINIVIL,ZESTRIL) 40 MG tablet Take 1 tablet (40 mg total) by mouth daily.  30 tablet  11  . HYDROcodone-acetaminophen (NORCO/VICODIN) 5-325 MG per tablet Take 1 tablet by mouth every 6 (six) hours as needed for moderate pain.  30 tablet  0  .  traMADol (ULTRAM) 50 MG tablet Take 1 tablet (50 mg total) by mouth 4 (four) times daily.  60 tablet  1   No current facility-administered medications on file prior to visit.   Allergies  Allergen Reactions  . Pravastatin     Myalgia's  . Zocor [Simvastatin]     Myalgia's   History   Social History  . Marital Status: Widowed    Spouse Name: N/A    Number of Children: N/A  . Years of Education: N/A   Occupational History  . Not on file.   Social History Main Topics  . Smoking status: Never Smoker   . Smokeless tobacco: Never Used  . Alcohol Use: No  . Drug Use: No  . Sexual Activity: Not on file   Other Topics Concern  . Not on file   Social History Narrative  . No narrative on file      Review of Systems  All other systems reviewed and are negative.      Objective:   Physical Exam  Vitals reviewed. Constitutional: She is oriented to person, place, and time.  Cardiovascular: Normal rate, regular rhythm, normal heart sounds and intact distal pulses.   No murmur heard. Pulmonary/Chest: Effort normal and breath sounds normal. No respiratory distress. She has no wheezes. She has no rales. She exhibits no tenderness.  Abdominal: Soft. Bowel sounds are normal. She exhibits no distension and no  mass. There is no tenderness. There is no rebound and no guarding.  Musculoskeletal: She exhibits no edema.       Lumbar back: She exhibits decreased range of motion, tenderness, pain and spasm. She exhibits no bony tenderness.  Neurological: She is alert and oriented to person, place, and time. She has normal reflexes. She displays normal reflexes. No cranial nerve deficit. She exhibits normal muscle tone. Coordination normal.          Assessment & Plan:  Frequency of urination - Plan: Urinalysis, Routine w reflex microscopic  Spinal stenosis of lumbar region  Essential hypertension  At the present time there is no evidence of cauda equina syndrome. I believe her  back pain is due to her spinal stenosis. Therefore I start the patient on a prednisone taper pack and will recheck her next week to see if her pain improves. Her urinalysis shows no evidence of urinary tract infection or pyelonephritis. Unfortunately her blood pressure is elevated. Therefore I will have amlodipine 5 mg by mouth daily 2 hydrochlorothiazide and lisinopril. I like to recheck the patient's blood pressure next week along with some fasting lab work.

## 2014-08-24 ENCOUNTER — Ambulatory Visit: Payer: Medicare PPO | Admitting: Family Medicine

## 2014-08-29 ENCOUNTER — Ambulatory Visit (INDEPENDENT_AMBULATORY_CARE_PROVIDER_SITE_OTHER): Payer: Medicare PPO | Admitting: Family Medicine

## 2014-08-29 ENCOUNTER — Encounter: Payer: Self-pay | Admitting: Family Medicine

## 2014-08-29 VITALS — BP 126/80 | HR 74 | Temp 97.6°F | Resp 14 | Ht 63.0 in | Wt 145.0 lb

## 2014-08-29 DIAGNOSIS — M48061 Spinal stenosis, lumbar region without neurogenic claudication: Secondary | ICD-10-CM

## 2014-08-29 DIAGNOSIS — M4806 Spinal stenosis, lumbar region: Secondary | ICD-10-CM

## 2014-08-29 NOTE — Progress Notes (Signed)
Subjective:    Patient ID: Kimberly Friedman, female    DOB: September 13, 1936, 78 y.o.   MRN: [id number]  Back Pain  08/17/14 Patient is a very pleasant 78 year old African American female who has a history of moderate spinal stenosis discovered on an MRI of the lumbar spine obtained in 2012. She has previously undergone epidural steroid injections. Today she presents with low back pain that "radiates all over my body."  She is a very sweet lady but a difficult historian. She has very little insight into her medical condition but she is unable to answer specific questions. She repeats that the pain is in her back and radiates all over her body. She denies symptoms of cauda equina. It is difficult to determine if the pains that she describes in her legs are sciatica.  She also states that she has been having increased urinary frequency and. She denies dysuria or hematuria. She denies any fevers.  She states that she is taking both her hydrochlorothiazide and pulse and thrill. Her blood pressures elevated today at 152/90. In the past she has been noncompliant with her blood pressure medication. However today she states she is taking both.  At that time, my plan was: At the present time there is no evidence of cauda equina syndrome. I believe her back pain is due to her spinal stenosis. Therefore I start the patient on a prednisone taper pack and will recheck her next week to see if her pain improves. Her urinalysis shows no evidence of urinary tract infection or pyelonephritis. Unfortunately her blood pressure is elevated. Therefore I will have amlodipine 5 mg by mouth daily 2 hydrochlorothiazide and lisinopril. I like to recheck the patient's blood pressure next week along with some fasting lab work.  08/29/14 Patient states that prednisone did not help substantially with the pain. However she is able to take Aleve 2 times a week and this controlled her pain adequately. The pain does seem to be centered in her  lower back and in her legs at the present time. There no signs or symptoms of cauda equina syndrome. Blood pressure is much better today Past Medical History  Diagnosis Date  . PMR (polymyalgia rheumatica)   . DDD (degenerative disc disease), lumbar   . Hypertension    No past surgical history on file. Current Outpatient Prescriptions on File Prior to Visit  Medication Sig Dispense Refill  . amLODipine (NORVASC) 5 MG tablet Take 1 tablet (5 mg total) by mouth daily.  90 tablet  3  . fish oil-omega-3 fatty acids 1000 MG capsule Take 1 g by mouth daily.      . fluticasone (FLONASE) 50 MCG/ACT nasal spray Place 2 sprays into the nose daily.  16 g  6  . hydrochlorothiazide (HYDRODIURIL) 25 MG tablet Take 1 tablet (25 mg total) by mouth daily.  30 tablet  11  . HYDROcodone-acetaminophen (NORCO/VICODIN) 5-325 MG per tablet Take 1 tablet by mouth every 6 (six) hours as needed for moderate pain.  30 tablet  0  . lisinopril (PRINIVIL,ZESTRIL) 40 MG tablet Take 1 tablet (40 mg total) by mouth daily.  30 tablet  11  . naproxen sodium (ANAPROX) 220 MG tablet Take 220 mg by mouth as needed.      . traMADol (ULTRAM) 50 MG tablet Take 1 tablet (50 mg total) by mouth 4 (four) times daily.  60 tablet  1   No current facility-administered medications on file prior to visit.   Allergies  Allergen  Reactions  . Pravastatin     Myalgia's  . Zocor [Simvastatin]     Myalgia's   History   Social History  . Marital Status: Widowed    Spouse Name: N/A    Number of Children: N/A  . Years of Education: N/A   Occupational History  . Not on file.   Social History Main Topics  . Smoking status: Never Smoker   . Smokeless tobacco: Never Used  . Alcohol Use: No  . Drug Use: No  . Sexual Activity: Not on file   Other Topics Concern  . Not on file   Social History Narrative  . No narrative on file      Review of Systems  Musculoskeletal: Positive for back pain.  All other systems reviewed and  are negative.      Objective:   Physical Exam  Vitals reviewed. Constitutional: She is oriented to person, place, and time.  Cardiovascular: Normal rate, regular rhythm, normal heart sounds and intact distal pulses.   No murmur heard. Pulmonary/Chest: Effort normal and breath sounds normal. No respiratory distress. She has no wheezes. She has no rales. She exhibits no tenderness.  Abdominal: Soft. Bowel sounds are normal. She exhibits no distension and no mass. There is no tenderness. There is no rebound and no guarding.  Musculoskeletal: She exhibits no edema.       Lumbar back: She exhibits decreased range of motion, tenderness, pain and spasm. She exhibits no bony tenderness.  Neurological: She is alert and oriented to person, place, and time. She has normal reflexes. No cranial nerve deficit. She exhibits normal muscle tone. Coordination normal.          Assessment & Plan:  Spinal stenosis of lumbar region  We will continue the patient on Aleve 500 mg by mouth twice a day when necessary low back pain. The patient's back pain worsens, for her third epidural steroid injection. Patient received her flu shot today. Her blood pressure is excellent.

## 2014-12-22 ENCOUNTER — Other Ambulatory Visit: Payer: Self-pay | Admitting: Family Medicine

## 2015-01-03 ENCOUNTER — Other Ambulatory Visit: Payer: Self-pay | Admitting: Family Medicine

## 2015-03-12 ENCOUNTER — Encounter: Payer: Self-pay | Admitting: Family Medicine

## 2015-03-12 ENCOUNTER — Ambulatory Visit (INDEPENDENT_AMBULATORY_CARE_PROVIDER_SITE_OTHER): Payer: Commercial Managed Care - HMO | Admitting: Family Medicine

## 2015-03-12 VITALS — BP 136/84 | HR 82 | Temp 97.7°F | Resp 18 | Ht 63.0 in | Wt 142.0 lb

## 2015-03-12 DIAGNOSIS — R252 Cramp and spasm: Secondary | ICD-10-CM

## 2015-03-12 MED ORDER — CYCLOBENZAPRINE HCL 5 MG PO TABS: 5.0000 mg | ORAL_TABLET | Freq: Two times a day (BID) | ORAL | Status: DC | PRN

## 2015-03-12 NOTE — Progress Notes (Signed)
Subjective:    Patient ID: Kimberly Friedman, female    DOB: [birth year], 79 y.o.   MRN: [id number]  HPI Patient is a very pleasant 79 year old African-American female who presents today with pains in her hands and feet. Due to her cognitive abilities, it is very difficult to obtain an accurate history. However it sounds a she is getting attacks in her hands and in her feet that occur almost on a daily basis. She was prescribed the attacks as severe pain that will cause her toes and fingers to draw up in spasms almost like cramps. It is extremely painful. These will last minutes to hours and then resolve spontaneously. There is nothing she can do to help or hurt the situation. The attacks seem to be limited to her hands and primarily her feet. Her feet occur much more frequently than her hands. She denies any cramps or pains in her calves or in her hamstrings are in her quadriceps or in her biceps or in her triceps. Past Medical History  Diagnosis Date  . PMR (polymyalgia rheumatica)   . DDD (degenerative disc disease), lumbar   . Hypertension    No past surgical history on file. Current Outpatient Prescriptions on File Prior to Visit  Medication Sig Dispense Refill  . amLODipine (NORVASC) 5 MG tablet Take 1 tablet (5 mg total) by mouth daily. 90 tablet 3  . fish oil-omega-3 fatty acids 1000 MG capsule Take 1 g by mouth daily.    . fluticasone (FLONASE) 50 MCG/ACT nasal spray Place 2 sprays into the nose daily. 16 g 6  . hydrochlorothiazide (HYDRODIURIL) 25 MG tablet TAKE ONE TABLET BY MOUTH ONCE DAILY 30 tablet 11  . HYDROcodone-acetaminophen (NORCO/VICODIN) 5-325 MG per tablet Take 1 tablet by mouth every 6 (six) hours as needed for moderate pain. 30 tablet 0  . lisinopril (PRINIVIL,ZESTRIL) 40 MG tablet TAKE ONE TABLET BY MOUTH ONCE DAILY 30 tablet 11  . naproxen sodium (ANAPROX) 220 MG tablet Take 220 mg by mouth as needed.    . traMADol (ULTRAM) 50 MG tablet Take 1 tablet (50 mg total) by  mouth 4 (four) times daily. 60 tablet 1   No current facility-administered medications on file prior to visit.   Allergies  Allergen Reactions  . Pravastatin     Myalgia's  . Zocor [Simvastatin]     Myalgia's   History   Social History  . Marital Status: Widowed    Spouse Name: N/A  . Number of Children: N/A  . Years of Education: N/A   Occupational History  . Not on file.   Social History Main Topics  . Smoking status: Never Smoker   . Smokeless tobacco: Never Used  . Alcohol Use: No  . Drug Use: No  . Sexual Activity: Not on file   Other Topics Concern  . Not on file   Social History Narrative      Review of Systems  All other systems reviewed and are negative.      Objective:   Physical Exam  Constitutional: She appears well-developed and well-nourished.  Cardiovascular: Normal rate, regular rhythm and normal heart sounds.   No murmur heard. Pulmonary/Chest: Effort normal and breath sounds normal. No respiratory distress. She has no wheezes. She has no rales.  Abdominal: Soft. Bowel sounds are normal. She exhibits no distension. There is no tenderness. There is no rebound and no guarding.  Musculoskeletal: She exhibits no edema.       Right hand: Normal.  Left hand: Normal.       Right foot: Normal.       Left foot: Normal.  Vitals reviewed.         Assessment & Plan:  Cramps, muscle, general - Plan: COMPLETE METABOLIC PANEL WITH GFR, Magnesium, CK, Sedimentation rate, cyclobenzaprine (FLEXERIL) 5 MG tablet  As best I can tell, this very sweet and polite lady seems to be having muscle cramps in her hands and in her feet which seems very unusual. I will begin by obtaining baseline lab work including a CMP to evaluate her potassium and her calcium, magnesium level, sedimentation rate to evaluate for autoimmune diseases as well as a CK level to evaluate for myositis. If lab work is normal, I will try treating the symptoms symptomatically with  Flexeril 5 mg by mouth twice a day. The patient does not drive. I warned the patient about sedation on this medication. Recheck in 2 weeks

## 2015-03-13 LAB — CK: CK TOTAL: 104 U/L (ref 7–177)

## 2015-03-13 LAB — COMPLETE METABOLIC PANEL WITH GFR
ALBUMIN: 4 g/dL (ref 3.5–5.2)
ALK PHOS: 53 U/L (ref 39–117)
ALT: 11 U/L (ref 0–35)
AST: 17 U/L (ref 0–37)
BUN: 25 mg/dL — ABNORMAL HIGH (ref 6–23)
CHLORIDE: 102 meq/L (ref 96–112)
CO2: 24 meq/L (ref 19–32)
Calcium: 9.4 mg/dL (ref 8.4–10.5)
Creat: 1.17 mg/dL — ABNORMAL HIGH (ref 0.50–1.10)
GFR, EST NON AFRICAN AMERICAN: 45 mL/min — AB
GFR, Est African American: 52 mL/min — ABNORMAL LOW
GLUCOSE: 111 mg/dL — AB (ref 70–99)
POTASSIUM: 4.4 meq/L (ref 3.5–5.3)
SODIUM: 140 meq/L (ref 135–145)
Total Bilirubin: 0.4 mg/dL (ref 0.2–1.2)
Total Protein: 8 g/dL (ref 6.0–8.3)

## 2015-03-13 LAB — SEDIMENTATION RATE: SED RATE: 61 mm/h — AB (ref 0–30)

## 2015-03-13 LAB — MAGNESIUM: Magnesium: 2.1 mg/dL (ref 1.5–2.5)

## 2015-03-28 ENCOUNTER — Encounter: Payer: Self-pay | Admitting: Family Medicine

## 2015-08-21 ENCOUNTER — Ambulatory Visit (INDEPENDENT_AMBULATORY_CARE_PROVIDER_SITE_OTHER): Payer: Commercial Managed Care - HMO | Admitting: Family Medicine

## 2015-08-21 ENCOUNTER — Encounter: Payer: Self-pay | Admitting: Family Medicine

## 2015-08-21 VITALS — BP 98/58 | HR 60 | Temp 98.0°F | Resp 14 | Ht 63.0 in | Wt 141.0 lb

## 2015-08-21 DIAGNOSIS — M48061 Spinal stenosis, lumbar region without neurogenic claudication: Secondary | ICD-10-CM

## 2015-08-21 DIAGNOSIS — M4806 Spinal stenosis, lumbar region: Secondary | ICD-10-CM

## 2015-08-21 DIAGNOSIS — Z23 Encounter for immunization: Secondary | ICD-10-CM | POA: Diagnosis not present

## 2015-08-21 MED ORDER — TRAMADOL HCL 50 MG PO TABS: 50.0000 mg | ORAL_TABLET | Freq: Four times a day (QID) | ORAL | Status: DC | PRN

## 2015-08-21 NOTE — Progress Notes (Signed)
Subjective:    Patient ID: Kimberly Friedman, female    DOB: 1936/05/22, 79 y.o.   MRN: [id number]  Back Pain  08/17/14 Patient is a very pleasant 79 year old African American female who has a history of moderate spinal stenosis discovered on an MRI of the lumbar spine obtained in 2012. She has previously undergone epidural steroid injections. Today she presents with low back pain that "radiates all over my body."  She is a very sweet lady but a difficult historian. She has very little insight into her medical condition but she is unable to answer specific questions. She repeats that the pain is in her back and radiates all over her body. She denies symptoms of cauda equina. It is difficult to determine if the pains that she describes in her legs are sciatica.  She also states that she has been having increased urinary frequency and. She denies dysuria or hematuria. She denies any fevers.  She states that she is taking both her hydrochlorothiazide and pulse and thrill. Her blood pressures elevated today at 152/90. In the past she has been noncompliant with her blood pressure medication. However today she states she is taking both.  At that time, my plan was: At the present time there is no evidence of cauda equina syndrome. I believe her back pain is due to her spinal stenosis. Therefore I start the patient on a prednisone taper pack and will recheck her next week to see if her pain improves. Her urinalysis shows no evidence of urinary tract infection or pyelonephritis. Unfortunately her blood pressure is elevated. Therefore I will have amlodipine 5 mg by mouth daily 2 hydrochlorothiazide and lisinopril. I like to recheck the patient's blood pressure next week along with some fasting lab work.  08/29/14 Patient states that prednisone did not help substantially with the pain. However she is able to take Aleve 2 times a week and this controlled her pain adequately. The pain does seem to be centered in her  lower back and in her legs at the present time. There no signs or symptoms of cauda equina syndrome. Blood pressure is much better today.  At that time, my plan was: We will continue the patient on Aleve 500 mg by mouth twice a day when necessary low back pain. The patient's back pain worsens, for her third epidural steroid injection. Patient received her flu shot today. Her blood pressure is excellent. 08/21/15 Patient's back pain has been steadily worsening. She complains of pain at approximately the level of L3-L4, L4-L5, and L5-S1. The pain seems to be centered in her lower back. She denies any radicular symptoms. She denies any bowel or bladder incontinence. She denies any leg weakness. She denies any numbness or tingling in her legs. She denies any saddle anesthesia. It is been 2 years since she had an epidural steroidal injection and she received good benefit from this. She is interested in another epidural sterile injection however Dr. Nelva Bush no longer accepts her insurance. He recommended Dr. Ernestina Patches. Past Medical History  Diagnosis Date  . PMR (polymyalgia rheumatica) ([facility])   . DDD (degenerative disc disease), lumbar   . Hypertension    No past surgical history on file. Current Outpatient Prescriptions on File Prior to Visit  Medication Sig Dispense Refill  . amLODipine (NORVASC) 5 MG tablet Take 1 tablet (5 mg total) by mouth daily. 90 tablet 3  . cyclobenzaprine (FLEXERIL) 5 MG tablet Take 1 tablet (5 mg total) by mouth 2 (two) times daily  as needed for muscle spasms (muscle cramps). (Patient not taking: Reported on 08/21/2015) 60 tablet 0  . fish oil-omega-3 fatty acids 1000 MG capsule Take 1 g by mouth daily.    . fluticasone (FLONASE) 50 MCG/ACT nasal spray Place 2 sprays into the nose daily. (Patient not taking: Reported on 08/21/2015) 16 g 6  . hydrochlorothiazide (HYDRODIURIL) 25 MG tablet TAKE ONE TABLET BY MOUTH ONCE DAILY 30 tablet 11  . HYDROcodone-acetaminophen (NORCO/VICODIN)  5-325 MG per tablet Take 1 tablet by mouth every 6 (six) hours as needed for moderate pain. (Patient not taking: Reported on 08/21/2015) 30 tablet 0  . lisinopril (PRINIVIL,ZESTRIL) 40 MG tablet TAKE ONE TABLET BY MOUTH ONCE DAILY 30 tablet 11   No current facility-administered medications on file prior to visit.   Allergies  Allergen Reactions  . Pravastatin     Myalgia's  . Zocor [Simvastatin]     Myalgia's   Social History   Social History  . Marital Status: Widowed    Spouse Name: N/A  . Number of Children: N/A  . Years of Education: N/A   Occupational History  . Not on file.   Social History Main Topics  . Smoking status: Never Smoker   . Smokeless tobacco: Never Used  . Alcohol Use: No  . Drug Use: No  . Sexual Activity: Not on file   Other Topics Concern  . Not on file   Social History Narrative      Review of Systems  Musculoskeletal: Positive for back pain.  All other systems reviewed and are negative.      Objective:   Physical Exam  Constitutional: She is oriented to person, place, and time.  Cardiovascular: Normal rate, regular rhythm, normal heart sounds and intact distal pulses.   No murmur heard. Pulmonary/Chest: Effort normal and breath sounds normal. No respiratory distress. She has no wheezes. She has no rales. She exhibits no tenderness.  Abdominal: Soft. Bowel sounds are normal. She exhibits no distension and no mass. There is no tenderness. There is no rebound and no guarding.  Musculoskeletal: She exhibits no edema.       Lumbar back: She exhibits decreased range of motion, tenderness, pain and spasm. She exhibits no bony tenderness.  Neurological: She is alert and oriented to person, place, and time. She has normal reflexes. No cranial nerve deficit. She exhibits normal muscle tone. Coordination normal.  Vitals reviewed.         Assessment & Plan:  Spinal stenosis, lumbar region, without neurogenic claudication - Plan: traMADol  (ULTRAM) 50 MG tablet, Ambulatory referral to Orthopedic Surgery  Continue Aleve twice a day. Add tramadol 50 mg 1-2 tablets every 6 hours as needed for severe pain. I will refer the patient to Dr. Ernestina Patches for possible epidural steroidal injection. I imagine they will repeat x-rays at his office and therefore I will not perform these at the present time unless they're not able to do them there. Patient received her flu shot today. Her blood pressure is low and I have asked her to check her blood pressure at home over the next week and call me with the values. If consistently this low, I would discontinue hydrochlorothiazide

## 2015-08-21 NOTE — Addendum Note (Signed)
Addended by: Shary Decamp B on: 08/21/2015 10:48 AM   Modules accepted: Orders

## 2015-09-19 ENCOUNTER — Encounter: Payer: Self-pay | Admitting: *Deleted

## 2015-09-21 ENCOUNTER — Ambulatory Visit: Payer: Self-pay | Admitting: Family Medicine

## 2015-10-03 ENCOUNTER — Telehealth: Payer: Self-pay | Admitting: *Deleted

## 2015-10-03 NOTE — Telephone Encounter (Signed)
Submitted [employer] referral thru [facility] for authorization to Dr Larae Grooms with authorization [id number]  Requesting provider: Flonnie Hailstone  Treating provider: Basil Dess, MD  Number of visits:6  Start Date: 11/22/15  End Date: 05/20/16  Dx: M48.06-Spinal stenosis,lumbar region  Copy has been faxed to [facility] for review

## 2015-11-22 ENCOUNTER — Other Ambulatory Visit: Payer: Self-pay | Admitting: Family Medicine

## 2015-11-22 DIAGNOSIS — M47816 Spondylosis without myelopathy or radiculopathy, lumbar region: Secondary | ICD-10-CM | POA: Diagnosis not present

## 2016-01-01 ENCOUNTER — Other Ambulatory Visit: Payer: Self-pay | Admitting: Specialist

## 2016-01-01 DIAGNOSIS — M545 Low back pain: Secondary | ICD-10-CM

## 2016-01-09 ENCOUNTER — Ambulatory Visit
Admission: RE | Admit: 2016-01-09 | Discharge: 2016-01-09 | Disposition: A | Discharge status: Home / Self Care | Payer: Medicare PPO | Source: Ambulatory Visit | Attending: Specialist | Admitting: Specialist

## 2016-01-09 DIAGNOSIS — M5126 Other intervertebral disc displacement, lumbar region: Secondary | ICD-10-CM | POA: Diagnosis not present

## 2016-01-09 DIAGNOSIS — M545 Low back pain: Secondary | ICD-10-CM

## 2016-01-11 ENCOUNTER — Telehealth: Payer: Self-pay | Admitting: *Deleted

## 2016-01-11 ENCOUNTER — Other Ambulatory Visit: Payer: Self-pay | Admitting: Family Medicine

## 2016-01-11 DIAGNOSIS — M4806 Spinal stenosis, lumbar region: Secondary | ICD-10-CM | POA: Diagnosis not present

## 2016-01-11 DIAGNOSIS — C9000 Multiple myeloma not having achieved remission: Secondary | ICD-10-CM

## 2016-01-11 DIAGNOSIS — M899 Disorder of bone, unspecified: Secondary | ICD-10-CM | POA: Diagnosis not present

## 2016-01-11 NOTE — Telephone Encounter (Signed)
-----  Message from Susy Frizzle, MD sent at 01/10/2016  3:20 PM EST ----- Patient has multiple myeloma on mri.  I need to see her for spep and upepe and she needs oncology appt,

## 2016-01-11 NOTE — Telephone Encounter (Signed)
Pt sister called back and aware and appt has been made, referral has been placed to oncology

## 2016-01-11 NOTE — Telephone Encounter (Signed)
[facility] with pt daughter to have pt return my call

## 2016-01-17 ENCOUNTER — Encounter: Payer: Self-pay | Admitting: Family Medicine

## 2016-01-17 ENCOUNTER — Ambulatory Visit (INDEPENDENT_AMBULATORY_CARE_PROVIDER_SITE_OTHER): Payer: Commercial Managed Care - HMO | Admitting: Family Medicine

## 2016-01-17 VITALS — BP 110/68 | HR 64 | Temp 97.6°F | Resp 14 | Ht 63.0 in | Wt 135.0 lb

## 2016-01-17 DIAGNOSIS — C9000 Multiple myeloma not having achieved remission: Secondary | ICD-10-CM

## 2016-01-17 DIAGNOSIS — M4806 Spinal stenosis, lumbar region: Secondary | ICD-10-CM

## 2016-01-17 DIAGNOSIS — M48061 Spinal stenosis, lumbar region without neurogenic claudication: Secondary | ICD-10-CM

## 2016-01-17 LAB — CBC WITH DIFFERENTIAL/PLATELET
BASOS PCT: 1 % (ref 0–1)
Basophils Absolute: 0 10*3/uL (ref 0.0–0.1)
EOS ABS: 0.1 10*3/uL (ref 0.0–0.7)
EOS PCT: 2 % (ref 0–5)
HCT: 32.4 % — ABNORMAL LOW (ref 36.0–46.0)
HEMOGLOBIN: 10.6 g/dL — AB (ref 12.0–15.0)
Lymphocytes Relative: 55 % — ABNORMAL HIGH (ref 12–46)
Lymphs Abs: 1.9 10*3/uL (ref 0.7–4.0)
MCH: 31 pg (ref 26.0–34.0)
MCHC: 32.7 g/dL (ref 30.0–36.0)
MCV: 94.7 fL (ref 78.0–100.0)
MONO ABS: 0.3 10*3/uL (ref 0.1–1.0)
MONOS PCT: 8 % (ref 3–12)
MPV: 12.4 fL (ref 8.6–12.4)
NEUTROS ABS: 1.2 10*3/uL — AB (ref 1.7–7.7)
Neutrophils Relative %: 34 % — ABNORMAL LOW (ref 43–77)
Platelets: 179 10*3/uL (ref 150–400)
RBC: 3.42 MIL/uL — AB (ref 3.87–5.11)
RDW: 17.8 % — AB (ref 11.5–15.5)
WBC: 3.4 10*3/uL — AB (ref 4.0–10.5)

## 2016-01-17 LAB — COMPLETE METABOLIC PANEL WITH GFR
ALBUMIN: 3.8 g/dL (ref 3.6–5.1)
ALT: 11 U/L (ref 6–29)
AST: 17 U/L (ref 10–35)
Alkaline Phosphatase: 45 U/L (ref 33–130)
BUN: 20 mg/dL (ref 7–25)
CALCIUM: 9.3 mg/dL (ref 8.6–10.4)
CO2: 26 mmol/L (ref 20–31)
CREATININE: 0.87 mg/dL (ref 0.60–0.93)
Chloride: 102 mmol/L (ref 98–110)
GFR, EST AFRICAN AMERICAN: 74 mL/min (ref >=60)
GFR, EST NON AFRICAN AMERICAN: 64 mL/min (ref >=60)
Glucose, Bld: 103 mg/dL — ABNORMAL HIGH (ref 70–99)
Potassium: 4.5 mmol/L (ref 3.5–5.3)
Sodium: 139 mmol/L (ref 135–146)
TOTAL PROTEIN: 7.9 g/dL (ref 6.1–8.1)
Total Bilirubin: 0.4 mg/dL (ref 0.2–1.2)

## 2016-01-17 MED ORDER — OXYCODONE-ACETAMINOPHEN 5-325 MG PO TABS: 1.0000 | ORAL_TABLET | Freq: Four times a day (QID) | ORAL | Status: DC | PRN

## 2016-01-17 NOTE — Progress Notes (Signed)
Subjective:    Patient ID: Kimberly Friedman, female    DOB: [birth year], 80 y.o.   MRN: [id number]  Back Pain  08/17/14 Patient is a very pleasant 80 year old African American female who has a history of moderate spinal stenosis discovered on an MRI of the lumbar spine obtained in 2012. Kimberly Friedman has previously undergone epidural steroid injections. Today Kimberly Friedman presents with low back pain that "radiates all over my body."  Kimberly Friedman is a very sweet lady but a difficult historian. Kimberly Friedman has very little insight into her medical condition but Kimberly Friedman is unable to answer specific questions. Kimberly Friedman repeats that the pain is in her back and radiates all over her body. Kimberly Friedman denies symptoms of cauda equina. It is difficult to determine if the pains that Kimberly Friedman describes in her legs are sciatica.  Kimberly Friedman also states that Kimberly Friedman has been having increased urinary frequency and. Kimberly Friedman denies dysuria or hematuria. Kimberly Friedman denies any fevers.  Kimberly Friedman states that Kimberly Friedman is taking both her hydrochlorothiazide and pulse and thrill. Her blood pressures elevated today at 152/90. In the past Kimberly Friedman has been noncompliant with her blood pressure medication. However today Kimberly Friedman states Kimberly Friedman is taking both.  At that time, my plan was: At the present time there is no evidence of cauda equina syndrome. I believe her back pain is due to her spinal stenosis. Therefore I start the patient on a prednisone taper pack and will recheck her next week to see if her pain improves. Her urinalysis shows no evidence of urinary tract infection or pyelonephritis. Unfortunately her blood pressure is elevated. Therefore I will have amlodipine 5 mg by mouth daily 2 hydrochlorothiazide and lisinopril. I like to recheck the patient's blood pressure next week along with some fasting lab work.  08/29/14 Patient states that prednisone did not help substantially with the pain. However Kimberly Friedman is able to take Aleve 2 times a week and this controlled her pain adequately. The pain does seem to be centered in her  lower back and in her legs at the present time. There no signs or symptoms of cauda equina syndrome. Blood pressure is much better today.  At that time, my plan was: We will continue the patient on Aleve 500 mg by mouth twice a day when necessary low back pain. The patient's back pain worsens, for her third epidural steroid injection. Patient received her flu shot today. Her blood pressure is excellent. 08/21/15 Patient's back pain has been steadily worsening. Kimberly Friedman complains of pain at approximately the level of L3-L4, L4-L5, and L5-S1. The pain seems to be centered in her lower back. Kimberly Friedman denies any radicular symptoms. Kimberly Friedman denies any bowel or bladder incontinence. Kimberly Friedman denies any leg weakness. Kimberly Friedman denies any numbness or tingling in her legs. Kimberly Friedman denies any saddle anesthesia. It is been 2 years since Kimberly Friedman had an epidural steroidal injection and Kimberly Friedman received good benefit from this. Kimberly Friedman is interested in another epidural sterile injection however Dr. Nelva Bush no longer accepts her insurance. He recommended Dr. Louanne Skye.  At that time, my plan was: Continue Aleve twice a day. Add tramadol 50 mg 1-2 tablets every 6 hours as needed for severe pain. I will refer the patient to Dr. Louanne Skye for possible epidural steroidal injection. I imagine they will repeat x-rays at his office and therefore I will not perform these at the present time unless they're not able to do them there. Patient received her flu shot today. Her blood pressure is low and I have asked her to  check her blood pressure at home over the next week and call me with the values. If consistently this low, I would discontinue hydrochlorothiazide  01/17/16 Kimberly Friedman was Dr. Louanne Skye for her back pain.  Initial L-spine xray revealed her chronic orthopedic issues.  They repeated an MRI of her lumbar spine February 22.  Unfortunately, it revealed interval development of diffuse bone marrow infiltration by innumerable small lesions most consistent with multiple myeloma. Kimberly Friedman also had  a disc protrusion at L3-L4 impinging upon the left L3 nerve root. Kimberly Friedman also had moderate spinal stenosis at L4-L5 unchanged. Kimberly Friedman is here today to discuss.  Past Medical History  Diagnosis Date  . PMR (polymyalgia rheumatica) (HCC)   . DDD (degenerative disc disease), lumbar   . Hypertension    No past surgical history on file. Current Outpatient Prescriptions on File Prior to Visit  Medication Sig Dispense Refill  . amLODipine (NORVASC) 5 MG tablet TAKE ONE TABLET BY MOUTH ONCE DAILY 90 tablet 3  . cyclobenzaprine (FLEXERIL) 5 MG tablet Take 1 tablet (5 mg total) by mouth 2 (two) times daily as needed for muscle spasms (muscle cramps). (Patient not taking: Reported on 08/21/2015) 60 tablet 0  . fish oil-omega-3 fatty acids 1000 MG capsule Take 1 g by mouth daily.    . fluticasone (FLONASE) 50 MCG/ACT nasal spray Place 2 sprays into the nose daily. (Patient not taking: Reported on 08/21/2015) 16 g 6  . hydrochlorothiazide (HYDRODIURIL) 25 MG tablet TAKE ONE TABLET BY MOUTH ONCE DAILY 30 tablet 11  . HYDROcodone-acetaminophen (NORCO/VICODIN) 5-325 MG per tablet Take 1 tablet by mouth every 6 (six) hours as needed for moderate pain. (Patient not taking: Reported on 08/21/2015) 30 tablet 0  . lisinopril (PRINIVIL,ZESTRIL) 40 MG tablet TAKE ONE TABLET BY MOUTH ONCE DAILY 30 tablet 11  . Naproxen Sodium (ALEVE) 220 MG CAPS Take by mouth.    . traMADol (ULTRAM) 50 MG tablet Take 1 tablet (50 mg total) by mouth every 6 (six) hours as needed for moderate pain. 60 tablet 1   No current facility-administered medications on file prior to visit.   Allergies  Allergen Reactions  . Pravastatin     Myalgia's  . Zocor [Simvastatin]     Myalgia's   Social History   Social History  . Marital Status: Widowed    Spouse Name: N/A  . Number of Children: N/A  . Years of Education: N/A   Occupational History  . Not on file.   Social History Main Topics  . Smoking status: Never Smoker   . Smokeless  tobacco: Never Used  . Alcohol Use: No  . Drug Use: No  . Sexual Activity: Not on file   Other Topics Concern  . Not on file   Social History Narrative      Review of Systems  Musculoskeletal: Positive for back pain.  All other systems reviewed and are negative.      Objective:   Physical Exam  Constitutional: Kimberly Friedman is oriented to person, place, and time.  Cardiovascular: Normal rate, regular rhythm, normal heart sounds and intact distal pulses.   No murmur heard. Pulmonary/Chest: Effort normal and breath sounds normal. No respiratory distress. Kimberly Friedman has no wheezes. Kimberly Friedman has no rales. Kimberly Friedman exhibits no tenderness.  Abdominal: Soft. Bowel sounds are normal. Kimberly Friedman exhibits no distension and no mass. There is no tenderness. There is no rebound and no guarding.  Musculoskeletal: Kimberly Friedman exhibits no edema.       Lumbar back: Kimberly Friedman exhibits decreased  range of motion, tenderness, pain and spasm. Kimberly Friedman exhibits no bony tenderness.  Neurological: Kimberly Friedman is alert and oriented to person, place, and time. Kimberly Friedman has normal reflexes. No cranial nerve deficit. Kimberly Friedman exhibits normal muscle tone. Coordination normal.  Vitals reviewed.         Assessment & Plan:  Spinal stenosis, lumbar region, without neurogenic claudication - Plan: oxyCODONE-acetaminophen (ROXICET) 5-325 MG tablet  Multiple myeloma not having achieved remission ([place]) - Plan: UPEP/UIFE/Light Chains/TP, 24-Hr Ur, Protein electrophoresis, serum, CBC with Differential/Platelet, COMPLETE METABOLIC PANEL WITH GFR, Ambulatory referral to Hematology / Oncology  Kimberly Friedman continues to complain of severe pain all over her body primarily in her lower back. This is been present for more than 5 years and may not be related to the multiple myeloma. Kimberly Friedman does have spinal stenosis and also a questionable history of PMR. At the present time I'll treat her pain with Percocet 5/325 one every 6 hours as needed for pain and I will give her 90 tablets. I will obtain an SPEP  and UPEP to evaluate for multiple myeloma and I'll schedule the patient to see the oncologist.

## 2016-01-17 NOTE — Addendum Note (Signed)
Addended by: Jenna Luo on: 01/17/2016 01:32 PM   Modules accepted: Orders

## 2016-01-18 ENCOUNTER — Telehealth: Payer: Self-pay | Admitting: Hematology and Oncology

## 2016-01-18 NOTE — Telephone Encounter (Signed)
Pt's sister is aware of np appt on 01/21/16@1 :30

## 2016-01-20 ENCOUNTER — Other Ambulatory Visit: Payer: Self-pay | Admitting: Family Medicine

## 2016-01-21 ENCOUNTER — Telehealth: Payer: Self-pay | Admitting: Hematology and Oncology

## 2016-01-21 ENCOUNTER — Telehealth: Payer: Self-pay | Admitting: Family Medicine

## 2016-01-21 ENCOUNTER — Telehealth: Payer: Self-pay | Admitting: *Deleted

## 2016-01-21 ENCOUNTER — Encounter: Payer: Self-pay | Admitting: Hematology and Oncology

## 2016-01-21 ENCOUNTER — Ambulatory Visit (HOSPITAL_BASED_OUTPATIENT_CLINIC_OR_DEPARTMENT_OTHER): Payer: Commercial Managed Care - HMO | Admitting: Hematology and Oncology

## 2016-01-21 ENCOUNTER — Other Ambulatory Visit: Payer: Self-pay

## 2016-01-21 VITALS — BP 128/51 | HR 98 | Temp 97.6°F | Resp 17 | Wt 137.3 lb

## 2016-01-21 DIAGNOSIS — D61818 Other pancytopenia: Secondary | ICD-10-CM

## 2016-01-21 DIAGNOSIS — R64 Cachexia: Secondary | ICD-10-CM

## 2016-01-21 DIAGNOSIS — R634 Abnormal weight loss: Secondary | ICD-10-CM

## 2016-01-21 DIAGNOSIS — G893 Neoplasm related pain (acute) (chronic): Secondary | ICD-10-CM | POA: Diagnosis not present

## 2016-01-21 DIAGNOSIS — M4806 Spinal stenosis, lumbar region: Secondary | ICD-10-CM | POA: Diagnosis not present

## 2016-01-21 DIAGNOSIS — M545 Low back pain: Secondary | ICD-10-CM

## 2016-01-21 DIAGNOSIS — I952 Hypotension due to drugs: Secondary | ICD-10-CM

## 2016-01-21 DIAGNOSIS — C9000 Multiple myeloma not having achieved remission: Secondary | ICD-10-CM | POA: Diagnosis not present

## 2016-01-21 DIAGNOSIS — C9002 Multiple myeloma in relapse: Secondary | ICD-10-CM | POA: Insufficient documentation

## 2016-01-21 HISTORY — DX: Multiple myeloma not having achieved remission: C90.00

## 2016-01-21 LAB — PROTEIN ELECTROPHORESIS, SERUM
ABNORMAL PROTEIN BAND1: 2.1 g/dL
ALBUMIN ELP: 3.9 g/dL (ref 3.8–4.8)
Alpha-1-Globulin: 0.2 g/dL (ref 0.2–0.3)
Alpha-2-Globulin: 0.7 g/dL (ref 0.5–0.9)
BETA 2: 2.5 g/dL — AB (ref 0.2–0.5)
BETA GLOBULIN: 0.4 g/dL (ref 0.4–0.6)
GAMMA GLOBULIN: 0.2 g/dL — AB (ref 0.8–1.7)
Total Protein, Serum Electrophoresis: 7.9 g/dL (ref 6.1–8.1)

## 2016-01-21 MED ORDER — LISINOPRIL 40 MG PO TABS: 40.0000 mg | ORAL_TABLET | Freq: Every day | ORAL | Status: DC

## 2016-01-21 MED ORDER — HYDROCHLOROTHIAZIDE 25 MG PO TABS: 25.0000 mg | ORAL_TABLET | Freq: Every day | ORAL | Status: DC

## 2016-01-21 NOTE — Telephone Encounter (Signed)
Pt needs a refill of HCTZ 25 MG and Lisinopril 25 mg called in to the [facility] in [place]

## 2016-01-21 NOTE — Telephone Encounter (Signed)
Submitted humana referral thru [facility] for authorization to Dr. Jana Hakim with authorization [id number]  Requesting provider: Flonnie Hailstone  Treating provider: Jana Hakim  Number of visits:6  Start Date: 01/21/16  End Date: 07/19/16  Dx: C90.00-Multiple myeloma not having achieved remission

## 2016-01-21 NOTE — Telephone Encounter (Signed)
Gave and printed appt sched and avs for pt for March and April °

## 2016-01-21 NOTE — Telephone Encounter (Signed)
Medication called/sent to requested pharmacy  

## 2016-01-21 NOTE — Addendum Note (Signed)
Addended by: Shary Decamp B on: 01/21/2016 10:58 AM   Modules accepted: Orders

## 2016-01-22 DIAGNOSIS — G893 Neoplasm related pain (acute) (chronic): Secondary | ICD-10-CM | POA: Insufficient documentation

## 2016-01-22 DIAGNOSIS — I952 Hypotension due to drugs: Secondary | ICD-10-CM | POA: Insufficient documentation

## 2016-01-22 DIAGNOSIS — R64 Cachexia: Secondary | ICD-10-CM | POA: Insufficient documentation

## 2016-01-22 DIAGNOSIS — D61818 Other pancytopenia: Secondary | ICD-10-CM | POA: Insufficient documentation

## 2016-01-22 MED ORDER — LENALIDOMIDE 5 MG PO CAPS: ORAL_CAPSULE | ORAL | Status: DC

## 2016-01-22 NOTE — Assessment & Plan Note (Addendum)
The patient had evidence of pancytopenia and recent MRI scan done show extensive lytic lesions. Serum protein electrophoresis performed at her physician's office confirmed monoclonal protein. To complete her workup, I will order skeletal survey and bone marrow biopsy. I will arrange for her to start chemotherapy next week. I will arrange for chemotherapy education class and will discuss further with her about treatment plan next week I plan to start her on combination treatment with dexamethasone, Revlimid, Velcade and Zometa

## 2016-01-22 NOTE — Progress Notes (Signed)
[facility] NOTE  Patient Care Team: Susy Frizzle, MD as PCP - General (Family Medicine)  CHIEF COMPLAINTS/PURPOSE OF CONSULTATION:  Extensive lytic lesions, pancytopenia, back pain, suspicious for newly diagnosed multiple myeloma  HISTORY OF PRESENTING ILLNESS:  Kimberly Friedman 80 y.o. female is here because of possible newly diagnosed multiple myeloma She is here today accompanied by 2 sisters. The patient had chronic back pain and background history of polymyalgia rheumatica She has been on NSAID and tramadol with no pain relief She has gone through extensive evaluation and was referred to orthopedic service and had MRI done which showed extensive lesions throughout her back. 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 Patient denies history of recurrent infection or atypical infections such as shingles of meningitis. She has near 20 pound weight loss gradually over the past year due to poor oral intake and poor appetite She rated her back pain at 8 out of 10 pain without pain medicine The patient denies any recent signs or symptoms of bleeding such as spontaneous epistaxis, hematuria or hematochezia.   MEDICAL HISTORY:  Past Medical History  Diagnosis Date  . PMR (polymyalgia rheumatica) (HCC)   . DDD (degenerative disc disease), lumbar   . Hypertension   . Multiple myeloma ([place]) 01/21/2016    SURGICAL HISTORY: Past Surgical History  Procedure Laterality Date  . Hemorrhoid surgery      SOCIAL HISTORY: Social History   Social History  . Marital Status: Widowed    Spouse Name: N/A  . Number of Children: N/A  . Years of Education: N/A   Occupational History  . Not on file.   Social History Main Topics  . Smoking status: Never Smoker   . Smokeless tobacco:  Never Used  . Alcohol Use: No  . Drug Use: No  . 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 Not on file   Social History Narrative    FAMILY HISTORY: Family History  Problem Relation Age of Onset  . Cancer Mother     lung ca  . Cancer Father     prostate ca  . Cancer Maternal Aunt     colon ca    ALLERGIES:  is allergic to pravastatin and zocor.  MEDICATIONS:  Current Outpatient Prescriptions  Medication Sig Dispense Refill  . amLODipine (NORVASC) 5 MG tablet TAKE ONE TABLET BY MOUTH ONCE DAILY 90 tablet 3  . hydrochlorothiazide (HYDRODIURIL) 25 MG tablet Take 1 tablet (25 mg total) by mouth daily. 30 tablet 11  . lisinopril (PRINIVIL,ZESTRIL) 40 MG tablet Take 1 tablet (40 mg total) by mouth daily. 30 tablet 11  . traMADol (ULTRAM) 50 MG tablet Take 1 tablet (50 mg total) by mouth every 6 (six) hours as needed for moderate pain. 60 tablet 1  . cyclobenzaprine (FLEXERIL) 5 MG tablet Take 1 tablet (5 mg total) by mouth 2 (two) times daily as needed for muscle spasms (muscle cramps). (Patient not taking: Reported on 08/21/2015) 60 tablet 0  . fish oil-omega-3 fatty acids 1000 MG capsule Take 1 g by mouth daily. Reported on 01/21/2016    . fluticasone (FLONASE) 50 MCG/ACT nasal spray Place 2 sprays into the nose daily. (Patient not taking: Reported on 08/21/2015) 16 g 6  . lenalidomide (REVLIMID)  5 MG capsule Take 1 capsule daily for 14 days then rest 7 days 14 capsule 0  . Naproxen Sodium (ALEVE) 220 MG CAPS Take by mouth. Reported on 01/21/2016    . oxyCODONE-acetaminophen (ROXICET) 5-325 MG tablet Take 1 tablet by mouth every 6 (six) hours as needed for severe pain. (Patient not taking: Reported on 01/21/2016) 90 tablet 0   No current facility-administered medications for this visit.    REVIEW OF SYSTEMS:   Eyes: Denies blurriness of vision, double vision or watery eyes Ears, nose, mouth, throat, and face: Denies mucositis or  sore throat Respiratory: Denies cough, dyspnea or wheezes Cardiovascular: Denies palpitation, chest discomfort or lower extremity swelling Gastrointestinal:  Denies nausea, heartburn or change in bowel habits Skin: Denies abnormal skin rashes Lymphatics: Denies new lymphadenopathy or easy bruising Neurological:Denies numbness, tingling or new weaknesses Behavioral/Psych: Mood is stable, no new changes  All other systems were reviewed with the patient and are negative.  PHYSICAL EXAMINATION: ECOG PERFORMANCE STATUS: 1 - Symptomatic but completely ambulatory  Filed Vitals:   01/21/16 1355  BP: 128/51  Pulse: 98  Temp: 97.6 F (36.4 C)  Resp: 17   Filed Weights   01/21/16 1355  Weight: 137 lb 4.8 oz (62.279 kg)    GENERAL:alert, no distress and comfortable. She looks thin, mildly cachectic  SKIN: skin color, texture, turgor are normal, no rashes or significant lesions EYES: normal, conjunctiva are pink and non-injected, sclera clear OROPHARYNX:no exudate, no erythema and lips, buccal mucosa, and tongue normal  NECK: supple, thyroid normal size, non-tender, without nodularity LYMPH:  no palpable lymphadenopathy in the cervical, axillary or inguinal LUNGS: clear to auscultation and percussion with normal breathing effort HEART: regular rate & rhythm and no murmurs and no lower extremity edema ABDOMEN:abdomen soft, non-tender and normal bowel sounds Musculoskeletal:no cyanosis of digits and no clubbing . She had diffuse back pain on gentle palpation on her lower back  PSYCH: alert & oriented x 3 with fluent speech NEURO: no focal motor/sensory deficits  LABORATORY DATA:  I have reviewed the data as listed Lab Results  Component Value Date   WBC 3.4* 01/17/2016   HGB 10.6* 01/17/2016   HCT 32.4* 01/17/2016   MCV 94.7 01/17/2016   PLT 179 01/17/2016    RADIOGRAPHIC STUDIES: I have personally reviewed the radiological images as listed and agreed with the findings in the  report. Mr Lumbar Spine Wo Contrast  01/09/2016  CLINICAL DATA:  Low back pain EXAM: MRI LUMBAR SPINE WITHOUT CONTRAST TECHNIQUE: Multiplanar, multisequence MR imaging of the lumbar spine was performed. No intravenous contrast was administered. COMPARISON:  01/15/2011 FINDINGS: Bone marrow is diffusely abnormal with innumerable small ill-defined lesions throughout all of the vertebral bodies. These were not present previously. Larger lesion in the region of the left medial iliac bone measures 21 mm also new. No fracture identified. Findings most consistent with diffuse neoplastic infiltration of the bone marrow such as myeloma. Conus medullaris normal and terminates L1-2. No retroperitoneal adenopathy. T12-L1:  Mild disc bulging without spinal stenosis L1-2: Mild disc bulging and mild retrolisthesis. No significant spinal stenosis L2-3: Disc degeneration with disc bulging. Mild facet degeneration. Mild spinal stenosis. No change from the prior study L3-4: Central and left-sided disc protrusion with mild progression from the prior study. Left foraminal encroachment with impingement of the left L3 nerve root has progressed. Bilateral facet degeneration. Mild to moderate spinal stenosis. L4-5: 4 mm anterior slip. Diffuse disc bulging. Bilateral facet degeneration. Moderate spinal stenosis. Mild foraminal  narrowing bilaterally L5-S1: Diffuse disc bulging. Right foraminal encroachment with impingement of the right L5 nerve root. Mild progression of right foraminal encroachment since the prior study. IMPRESSION: Interval development of diffuse bone marrow infiltration by innumerable small lesions most consistent with multiple myeloma. Metastatic disease also considered. Correlate with laboratory evaluation. No fracture Central left-sided disc protrusion has progressed at L3-4. Progression of left foraminal encroachment with impingement left L3 nerve root. Moderate spinal stenosis L4-5 unchanged Progressive right  foraminal encroachment L5-S1. These results will be called to the ordering clinician or representative by the Radiologist Assistant, and communication documented in the PACS or zVision Dashboard. Electronically Signed   By: Franchot Gallo M.D.   On: 01/09/2016 13:59    ASSESSMENT & PLAN:  Multiple myeloma ([place]) The patient had evidence of pancytopenia and recent MRI scan done show extensive lytic lesions. Serum protein electrophoresis performed at her physician's office confirmed monoclonal protein. To complete her workup, I will order skeletal survey and bone marrow biopsy. I will arrange for her to start chemotherapy next week. I will arrange for chemotherapy education class and will discuss further with her about treatment plan next week I plan to start her on combination treatment with dexamethasone, Revlimid, Velcade and Zometa  Acquired pancytopenia ([place]) This is related to bone marrow disease. She is not symptomatic. Observe only. I will dose reduce the Revlimid because of this  Cancer associated pain She is in a lot of pain because of disease. She is prescribed narcotic prescription by her primary care doctor. 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 As mentioned above, I plan to start her on dexamethasone this week and hopefully that will improve her appetite.  Hypotension due to drugs Due to recent weight loss and poor oral intake, she has lost a lot of weight. Her blood pressure is noted to be low. I recommend discontinuation of hydrochlorothiazide and I will monitor her blood pressure carefully while on treatment    Orders Placed This Encounter  Procedures  . DG Bone Survey Met    Standing Status: Future     Number of Occurrences:      Standing Expiration Date: 03/22/2017    Order Specific Question:  Reason for Exam (SYMPTOM  OR DIAGNOSIS REQUIRED)    Answer:  staging myeloma     Order Specific Question:  Preferred imaging location?    Answer:  [facility]  . CBC with Differential/Platelet    Standing Status: Future     Number of Occurrences:      Standing Expiration Date: 02/25/2017  . Comprehensive metabolic panel    Standing Status: Future     Number of Occurrences:      Standing Expiration Date: 02/25/2017  . Kappa/lambda light chains    Standing Status: Future     Number of Occurrences:      Standing Expiration Date: 02/25/2017  . Multiple Myeloma Panel (SPEP&IFE w/QIG)    Standing Status: Future     Number of Occurrences:      Standing Expiration Date: 02/25/2017  . Beta 2 microglobulin, serum    Standing Status: Future     Number of Occurrences:      Standing Expiration Date: 02/25/2017  . Uric acid    Standing Status: Future     Number of Occurrences:      Standing Expiration Date: 02/25/2017  . Lactate dehydrogenase (LDH)    Standing Status:  Future     Number of Occurrences:      Standing Expiration Date: 02/25/2017    All questions were answered. The patient knows to call the clinic with any problems, questions or concerns. I spent 55 minutes counseling the patient face to face. The total time spent in the appointment was 60 minutes and more than 50% was on counseling.     [facility] [place], MD 01/22/2016 6:49 AM

## 2016-01-22 NOTE — Assessment & Plan Note (Signed)
Due to recent weight loss and poor oral intake, she has lost a lot of weight. Her blood pressure is noted to be low. I recommend discontinuation of hydrochlorothiazide and I will monitor her blood pressure carefully while on treatment

## 2016-01-22 NOTE — Assessment & Plan Note (Signed)
She has developed cancer cachexia. As mentioned above, I plan to start her on dexamethasone this week and hopefully that will improve her appetite.

## 2016-01-22 NOTE — Assessment & Plan Note (Signed)
This is related to bone marrow disease. She is not symptomatic. Observe only. I will dose reduce the Revlimid because of this

## 2016-01-22 NOTE — Assessment & Plan Note (Signed)
She is in a lot of pain because of disease. She is prescribed narcotic prescription by her primary care doctor. After bone marrow biopsy, I will order pulse steroid treatment for her

## 2016-01-23 ENCOUNTER — Ambulatory Visit (HOSPITAL_COMMUNITY): Payer: Commercial Managed Care - HMO

## 2016-01-23 ENCOUNTER — Ambulatory Visit (HOSPITAL_BASED_OUTPATIENT_CLINIC_OR_DEPARTMENT_OTHER): Payer: Commercial Managed Care - HMO | Admitting: Hematology and Oncology

## 2016-01-23 ENCOUNTER — Ambulatory Visit (HOSPITAL_COMMUNITY)
Admission: RE | Admit: 2016-01-23 | Discharge: 2016-01-23 | Disposition: A | Discharge status: Home / Self Care | Payer: Commercial Managed Care - HMO | Source: Ambulatory Visit | Attending: Hematology and Oncology | Admitting: Hematology and Oncology

## 2016-01-23 ENCOUNTER — Other Ambulatory Visit (HOSPITAL_COMMUNITY)
Admission: RE | Admit: 2016-01-23 | Discharge: 2016-01-23 | Disposition: A | Discharge status: Home / Self Care | Payer: Commercial Managed Care - HMO | Source: Ambulatory Visit | Attending: Hematology and Oncology | Admitting: Hematology and Oncology

## 2016-01-23 ENCOUNTER — Other Ambulatory Visit (HOSPITAL_BASED_OUTPATIENT_CLINIC_OR_DEPARTMENT_OTHER): Payer: Commercial Managed Care - HMO

## 2016-01-23 ENCOUNTER — Encounter: Payer: Self-pay | Admitting: Hematology and Oncology

## 2016-01-23 ENCOUNTER — Telehealth: Payer: Self-pay | Admitting: *Deleted

## 2016-01-23 VITALS — BP 130/67 | HR 64 | Temp 98.4°F | Resp 18

## 2016-01-23 DIAGNOSIS — M545 Low back pain: Secondary | ICD-10-CM | POA: Diagnosis not present

## 2016-01-23 DIAGNOSIS — I6529 Occlusion and stenosis of unspecified carotid artery: Secondary | ICD-10-CM | POA: Insufficient documentation

## 2016-01-23 DIAGNOSIS — C9000 Multiple myeloma not having achieved remission: Secondary | ICD-10-CM

## 2016-01-23 DIAGNOSIS — D649 Anemia, unspecified: Secondary | ICD-10-CM | POA: Diagnosis not present

## 2016-01-23 DIAGNOSIS — M8580 Other specified disorders of bone density and structure, unspecified site: Secondary | ICD-10-CM | POA: Diagnosis not present

## 2016-01-23 DIAGNOSIS — Z029 Encounter for administrative examinations, unspecified: Secondary | ICD-10-CM | POA: Diagnosis present

## 2016-01-23 DIAGNOSIS — D61818 Other pancytopenia: Secondary | ICD-10-CM | POA: Diagnosis not present

## 2016-01-23 DIAGNOSIS — D4989 Neoplasm of unspecified behavior of other specified sites: Secondary | ICD-10-CM | POA: Diagnosis not present

## 2016-01-23 LAB — CBC WITH DIFFERENTIAL/PLATELET
BASO%: 0.8 % (ref 0.0–2.0)
Basophils Absolute: 0 10*3/uL (ref 0.0–0.1)
EOS ABS: 0.1 10*3/uL (ref 0.0–0.5)
EOS%: 2.8 % (ref 0.0–7.0)
HCT: 32.2 % — ABNORMAL LOW (ref 34.8–46.6)
HEMOGLOBIN: 10.6 g/dL — AB (ref 11.6–15.9)
LYMPH#: 1.4 10*3/uL (ref 0.9–3.3)
LYMPH%: 36 % (ref 14.0–49.7)
MCH: 31.5 pg (ref 25.1–34.0)
MCHC: 32.9 g/dL (ref 31.5–36.0)
MCV: 95.8 fL (ref 79.5–101.0)
MONO#: 0.3 10*3/uL (ref 0.1–0.9)
MONO%: 8.6 % (ref 0.0–14.0)
NEUT%: 51.8 % (ref 38.4–76.8)
NEUTROS ABS: 2 10*3/uL (ref 1.5–6.5)
PLATELETS: 176 10*3/uL (ref 145–400)
RBC: 3.36 10*6/uL — ABNORMAL LOW (ref 3.70–5.45)
RDW: 17.2 % — AB (ref 11.2–14.5)
WBC: 3.9 10*3/uL (ref 3.9–10.3)

## 2016-01-23 LAB — COMPREHENSIVE METABOLIC PANEL
ALBUMIN: 3.6 g/dL (ref 3.5–5.0)
ALK PHOS: 53 U/L (ref 40–150)
ALT: 12 U/L (ref 0–55)
AST: 16 U/L (ref 5–34)
Anion Gap: 11 mEq/L (ref 3–11)
BUN: 23.2 mg/dL (ref 7.0–26.0)
CHLORIDE: 105 meq/L (ref 98–109)
CO2: 25 mEq/L (ref 22–29)
Calcium: 9.2 mg/dL (ref 8.4–10.4)
Creatinine: 0.9 mg/dL (ref 0.6–1.1)
EGFR: 60 mL/min/{1.73_m2} — AB (ref >90)
GLUCOSE: 116 mg/dL (ref 70–140)
POTASSIUM: 4.3 meq/L (ref 3.5–5.1)
SODIUM: 142 meq/L (ref 136–145)
Total Bilirubin: 0.35 mg/dL (ref 0.20–1.20)
Total Protein: 8.6 g/dL — ABNORMAL HIGH (ref 6.4–8.3)

## 2016-01-23 LAB — URIC ACID: Uric Acid, Serum: 3.9 mg/dl (ref 2.6–7.4)

## 2016-01-23 LAB — LACTATE DEHYDROGENASE: LDH: 189 U/L (ref 125–245)

## 2016-01-23 LAB — BONE MARROW EXAM

## 2016-01-23 NOTE — Assessment & Plan Note (Signed)
This is related to bone marrow disease. She is not symptomatic. Observe only. I will dose reduce the Revlimid because of this

## 2016-01-23 NOTE — Progress Notes (Signed)
[facility] OFFICE PROGRESS NOTE  Patient Care Team: Susy Frizzle, MD as PCP - General (Family Medicine)  SUMMARY OF ONCOLOGIC HISTORY:  Kimberly Friedman 80 y.o. female is here because of possible newly diagnosed multiple myeloma She is here today accompanied by 2 sisters. The patient had chronic back pain and background history of polymyalgia rheumatica She has been on NSAID and tramadol with no pain relief She has gone through extensive evaluation and was referred to orthopedic service and had MRI done which showed extensive lesions throughout her back. 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 Patient denies history of recurrent infection or atypical infections such as shingles of meningitis. She has near 20 pound weight loss gradually over the past year due to poor oral intake and poor appetite She rated her back pain at 8 out of 10 pain without pain medicine The patient denies any recent signs or symptoms of bleeding such as spontaneous epistaxis, hematuria or hematochezia.  INTERVAL HISTORY: Please see below for problem oriented charting. She returns for bone marrow biopsy today. She continues her lower back pain but not severe. The patient denies any recent signs or symptoms of bleeding such as spontaneous epistaxis, hematuria or hematochezia.   REVIEW OF SYSTEMS:   All other systems were reviewed with the patient and are negative.  I have reviewed the past medical history, past surgical history, social history and family history with the patient and they are unchanged from previous note.  ALLERGIES:  is allergic to pravastatin and zocor.  MEDICATIONS:  Current Outpatient Prescriptions  Medication Sig Dispense Refill  . amLODipine (NORVASC) 5 MG tablet TAKE ONE TABLET  BY MOUTH ONCE DAILY 90 tablet 3  . cyclobenzaprine (FLEXERIL) 5 MG tablet Take 1 tablet (5 mg total) by mouth 2 (two) times daily as needed for muscle spasms (muscle cramps). (Patient not taking: Reported on 08/21/2015) 60 tablet 0  . fish oil-omega-3 fatty acids 1000 MG capsule Take 1 g by mouth daily. Reported on 01/21/2016    . fluticasone (FLONASE) 50 MCG/ACT nasal spray Place 2 sprays into the nose daily. (Patient not taking: Reported on 08/21/2015) 16 g 6  . hydrochlorothiazide (HYDRODIURIL) 25 MG tablet Take 1 tablet (25 mg total) by mouth daily. 30 tablet 11  . lenalidomide (REVLIMID) 5 MG capsule Take 1 capsule daily for 14 days then rest 7 days 14 capsule 0  . lisinopril (PRINIVIL,ZESTRIL) 40 MG tablet Take 1 tablet (40 mg total) by mouth daily. 30 tablet 11  . Naproxen Sodium (ALEVE) 220 MG CAPS Take by mouth. Reported on 01/21/2016    . oxyCODONE-acetaminophen (ROXICET) 5-325 MG tablet Take 1 tablet by mouth every 6 (six) hours as needed for severe pain. (Patient not taking: Reported on 01/21/2016) 90 tablet 0  . traMADol (ULTRAM) 50 MG tablet Take 1 tablet (50 mg total) by mouth every 6 (six) hours as needed for moderate pain. 60 tablet 1   No current facility-administered medications for this visit.    PHYSICAL EXAMINATION: ECOG PERFORMANCE STATUS: 1 - Symptomatic but completely ambulatory  Filed Vitals:   01/23/16 0800 01/23/16 0950  BP: 129/67 130/67  Pulse: 81 64  Temp: 98 F (36.7 C) 98.4 F (36.9 C)  Resp: 18 18   There were no vitals filed for this visit.  GENERAL:alert, no distress and comfortable SKIN:  skin color, texture, turgor are normal, no rashes or significant lesions EYES: normal, Conjunctiva are pink and non-injected, sclera clear Musculoskeletal:no cyanosis of digits and no clubbing  NEURO: alert & oriented x 3 with fluent speech, no focal motor/sensory deficits  LABORATORY DATA:  I have reviewed the data as listed    Component Value Date/Time   NA 142  01/23/2016 1014   NA 139 01/17/2016 0910   K 4.3 01/23/2016 1014   K 4.5 01/17/2016 0910   CL 102 01/17/2016 0910   CO2 25 01/23/2016 1014   CO2 26 01/17/2016 0910   GLUCOSE 116 01/23/2016 1014   GLUCOSE 103* 01/17/2016 0910   BUN 23.2 01/23/2016 1014   BUN 20 01/17/2016 0910   CREATININE 0.9 01/23/2016 1014   CREATININE 0.87 01/17/2016 0910   CREATININE 1.03 08/16/2010 2050   CALCIUM 9.2 01/23/2016 1014   CALCIUM 9.3 01/17/2016 0910   PROT 8.6* 01/23/2016 1014   PROT 7.9 01/17/2016 0910   ALBUMIN 3.6 01/23/2016 1014   ALBUMIN 3.8 01/17/2016 0910   AST 16 01/23/2016 1014   AST 17 01/17/2016 0910   ALT 12 01/23/2016 1014   ALT 11 01/17/2016 0910   ALKPHOS 53 01/23/2016 1014   ALKPHOS 45 01/17/2016 0910   BILITOT 0.35 01/23/2016 1014   BILITOT 0.4 01/17/2016 0910   GFRNONAA 64 01/17/2016 0910   GFRNONAA 53* 08/16/2010 2050   GFRAA 74 01/17/2016 0910   GFRAA  08/16/2010 2050    >60        The eGFR has been calculated using the MDRD equation. This calculation has not been validated in all clinical situations. eGFR's persistently <60 mL/min signify possible Chronic Kidney Disease.    No results found for: SPEP, UPEP  Lab Results  Component Value Date   WBC 3.9 01/23/2016   NEUTROABS 2.0 01/23/2016   HGB 10.6* 01/23/2016   HCT 32.2* 01/23/2016   MCV 95.8 01/23/2016   PLT 176 01/23/2016      Chemistry      Component Value Date/Time   NA 142 01/23/2016 1014   NA 139 01/17/2016 0910   K 4.3 01/23/2016 1014   K 4.5 01/17/2016 0910   CL 102 01/17/2016 0910   CO2 25 01/23/2016 1014   CO2 26 01/17/2016 0910   BUN 23.2 01/23/2016 1014   BUN 20 01/17/2016 0910   CREATININE 0.9 01/23/2016 1014   CREATININE 0.87 01/17/2016 0910   CREATININE 1.03 08/16/2010 2050      Component Value Date/Time   CALCIUM 9.2 01/23/2016 1014   CALCIUM 9.3 01/17/2016 0910   ALKPHOS 53 01/23/2016 1014   ALKPHOS 45 01/17/2016 0910   AST 16 01/23/2016 1014   AST 17 01/17/2016 0910    ALT 12 01/23/2016 1014   ALT 11 01/17/2016 0910   BILITOT 0.35 01/23/2016 1014   BILITOT 0.4 01/17/2016 0910       RADIOGRAPHIC STUDIES: I have personally reviewed the radiological images as listed and agreed with the findings in the report. Dg Bone Survey Met  01/23/2016  CLINICAL DATA:  Multiple myeloma. EXAM: METASTATIC BONE SURVEY COMPARISON:  MRI 01/09/2016. FINDINGS: Faint punctate lucencies noted in the skull. No other focal lytic abnormalities identified. Diffuse osteopenia is present. No acute bony abnormality. Carotid and aortoiliac atherosclerotic vascular calcification noted. IMPRESSION: 1. Faint punctate lucencies noted throughout the skull. Diffuse osteopenia is present. These findings are consistent with patient's clinical diagnosis of multiple myeloma. 2.  Carotid and aortoiliac atherosclerotic vascular disease. Electronically Signed  By: [place]   On: 01/23/2016 11:54     ASSESSMENT & PLAN:  Multiple myeloma ([place]) I will complete staging bone marrow biopsy and skeletal survey. We will consent her for Revlimid to start in the future. I will see her back next week to review all test results.  Acquired pancytopenia ([place]) This is related to bone marrow disease. She is not symptomatic. Observe only. I will dose reduce the Revlimid because of this   Bone Marrow Biopsy and Aspiration Procedure Note   Informed consent was obtained and potential risks including bleeding, infection and pain were reviewed with the patient. The patient's name, date of birth, identification, consent and allergies were verified prior to the start of procedure and time out was performed.  The right posterior iliac crest was chosen as the site of biopsy.  The skin was prepped with Betadine solution.   8 cc of 1% lidocaine was used to provide local anaesthesia.   10 cc of bone marrow aspirate was obtained followed by 1 inch biopsy.   The procedure was tolerated well and there were  no complications.  The patient was stable at the end of the procedure.  Specimens sent for flow cytometry, cytogenetics and additional studies.   All questions were answered. The patient knows to call the clinic with any problems, questions or concerns. No barriers to learning was detected. I spent 25 minutes counseling the patient face to face. The total time spent in the appointment was 30 minutes and more than 50% was on counseling and review of test results     [facility], [place], MD 01/23/2016 2:30 PM

## 2016-01-23 NOTE — Progress Notes (Signed)
Pt tolerated bone marrow biopsy without complication.  Instructions reviewed with patient and family.  Pt and family verbalized understanding.  Dressing to right hip clean dry and intact.

## 2016-01-23 NOTE — Assessment & Plan Note (Signed)
I will complete staging bone marrow biopsy and skeletal survey. We will consent her for Revlimid to start in the future. I will see her back next week to review all test results.

## 2016-01-23 NOTE — Telephone Encounter (Signed)
Reviewed Revlimid Consent forms and information with pt and her sisters in clinic this morning.  Revlimid patient information given to pt's sister, Suszanne Conners.  Changed pt's address and phone number in [place] to pt's sister's, Leila, home number and address.   Sister says it is difficult for pt to understand on the phone and so sister will be arranging for delivery of Revlimid to her address.   Rx was transferred to [facility].   I updated the phone and number and address with [facility] and confirmed dose and instructions w/ pharmacist.

## 2016-01-23 NOTE — Patient Instructions (Signed)
Bone Marrow Aspiration and Bone Marrow Biopsy Bone marrow aspiration and bone marrow biopsy are procedures that are done to diagnose blood disorders. You may also have one of these procedures to help diagnose infections or some types of cancer. Bone marrow is the soft tissue that is inside your bones. Blood cells are produced in bone marrow. For bone marrow aspiration, a sample of tissue in liquid form is removed from inside your bone. For a bone marrow biopsy, a small core of bone marrow tissue is removed. Then these samples are examined under a microscope or tested in a lab. You may need these procedures if you have an abnormal complete blood count (CBC). The aspiration or biopsy sample is usually taken from the top of your hip bone. Sometimes, an aspiration sample is taken from your chest bone (sternum). LET YOUR HEALTH CARE PROVIDER KNOW ABOUT:  Any allergies you have.  All medicines you are taking, including vitamins, herbs, eye drops, creams, and over-the-counter medicines.  Previous problems you or members of your family have had with the use of anesthetics.  Any blood disorders you have.  Previous surgeries you have had.  Any medical conditions you may have.  Whether you are pregnant or you think that you may be pregnant. RISKS AND COMPLICATIONS Generally, this is a safe procedure. However, problems may occur, including:  Infection.  Bleeding. BEFORE THE PROCEDURE  Ask your health care provider about:  Changing or stopping your regular medicines. This is especially important if you are taking diabetes medicines or blood thinners.  Taking medicines such as aspirin and ibuprofen. These medicines can thin your blood. Do not take these medicines before your procedure if your health care provider instructs you not to.  Plan to have someone take you home after the procedure.  If you go home right after the procedure, plan to have someone with you for 24 hours. PROCEDURE   An  IV tube may be inserted into one of your veins.  The injection site will be cleaned with a germ-killing solution (antiseptic).  You will be given one or more of the following:  A medicine that helps you relax (sedative).  A medicine that numbs the area (local anesthetic).  The bone marrow sample will be removed as follows:  For an aspiration, a hollow needle will be inserted through your skin and into your bone. Bone marrow fluid will be drawn up into a syringe.  For a biopsy, your health care provider will use a hollow needle to remove a core of tissue from your bone marrow.  The needle will be removed.  A bandage (dressing) will be placed over the insertion site and taped in place. The procedure may vary among health care providers and hospitals. AFTER THE PROCEDURE  Your blood pressure, heart rate, breathing rate, and blood oxygen level will be monitored often until the medicines you were given have worn off.  Return to your normal activities as directed by your health care provider.   This information is not intended to replace advice given to you by your health care provider. Make sure you discuss any questions you have with your health care provider.   Document Released: 11/06/2004 Document Revised: 03/20/2015 Document Reviewed: 10/25/2014 Elsevier Interactive Patient Education 2016 Elsevier Inc.  

## 2016-01-24 ENCOUNTER — Encounter: Payer: Self-pay | Admitting: Hematology and Oncology

## 2016-01-24 LAB — BETA 2 MICROGLOBULIN, SERUM: BETA 2: 4.1 mg/L — AB (ref 0.6–2.4)

## 2016-01-24 LAB — KAPPA/LAMBDA LIGHT CHAINS
IG LAMBDA FREE LIGHT CHAIN: 2.78 mg/L — AB (ref 5.71–26.30)
Ig Kappa Free Light Chain: 1183 mg/L — ABNORMAL HIGH (ref 3.30–19.40)
KAPPA/LAMBDA FLC RATIO: 425.54 — AB (ref 0.26–1.65)

## 2016-01-24 NOTE — Progress Notes (Signed)
Called pt's sister Ms. Joya Gaskins to discuss financial assistance.  Pt has 2 insurances so copay assistance is not needed so I offered the [place] to help w/ transportation & personal bills.  She will provide proof of income & complete the [employer] [place] application on [id number].

## 2016-01-25 LAB — PROTEIN, TOTAL AND ELECTRO, 24 HR U
ALBUMIN UR 24 HR ELECTRO: 5 %
ALPHA-1-GLOBULIN, U: 14.7 %
Alpha-2-Globulin, U: 10.8 %
BETA GLOBULIN, U: 4 %
CREATININE 24H UR: 0.41 g/(24.h) — AB (ref 0.63–2.50)
Collection Interval: 24 hours
Creatinine, Urine: 82 mg/dL (ref 20–320)
GAMMA GLOBULIN, U: 65.5 %
PROTEIN CREATININE RATIO: 915 mg/g{creat} — AB (ref 21–161)
PROTEIN, URINE: 75 mg/dL — AB (ref 5–24)
Protein, 24H Urine: 375 mg/24 h — ABNORMAL HIGH (ref <150)
TOTAL VOLUME, URINE: 500 mL

## 2016-01-25 LAB — MULTIPLE MYELOMA PANEL, SERUM
ALBUMIN SERPL ELPH-MCNC: 3.9 g/dL (ref 2.9–4.4)
ALBUMIN/GLOB SERPL: 1 (ref 0.7–1.7)
ALPHA 1: 0.2 g/dL (ref 0.0–0.4)
ALPHA2 GLOB SERPL ELPH-MCNC: 0.7 g/dL (ref 0.4–1.0)
B-GLOBULIN SERPL ELPH-MCNC: 2.9 g/dL — AB (ref 0.7–1.3)
GAMMA GLOB SERPL ELPH-MCNC: 0.4 g/dL (ref 0.4–1.8)
GLOBULIN, TOTAL: 4.2 g/dL — AB (ref 2.2–3.9)
IGG (IMMUNOGLOBIN G), SERUM: 272 mg/dL — AB (ref 700–1600)
IgM, Qn, Serum: <5 mg/dL — ABNORMAL LOW (ref 26–217)
M PROTEIN SERPL ELPH-MCNC: 1.8 g/dL — AB
Total Protein: 8.1 g/dL (ref 6.0–8.5)

## 2016-01-29 ENCOUNTER — Telehealth: Payer: Self-pay | Admitting: Hematology and Oncology

## 2016-01-29 ENCOUNTER — Encounter: Payer: Self-pay | Admitting: Hematology and Oncology

## 2016-01-29 ENCOUNTER — Other Ambulatory Visit: Payer: Commercial Managed Care - HMO

## 2016-01-29 ENCOUNTER — Ambulatory Visit (HOSPITAL_BASED_OUTPATIENT_CLINIC_OR_DEPARTMENT_OTHER): Payer: Commercial Managed Care - HMO | Admitting: Hematology and Oncology

## 2016-01-29 ENCOUNTER — Ambulatory Visit (HOSPITAL_BASED_OUTPATIENT_CLINIC_OR_DEPARTMENT_OTHER): Payer: Commercial Managed Care - HMO

## 2016-01-29 VITALS — BP 104/44 | HR 67 | Temp 97.9°F | Resp 17 | Ht 63.0 in | Wt 135.8 lb

## 2016-01-29 VITALS — BP 106/60 | HR 60 | Temp 97.8°F | Resp 18

## 2016-01-29 DIAGNOSIS — C9000 Multiple myeloma not having achieved remission: Secondary | ICD-10-CM | POA: Diagnosis not present

## 2016-01-29 DIAGNOSIS — I952 Hypotension due to drugs: Secondary | ICD-10-CM | POA: Diagnosis not present

## 2016-01-29 DIAGNOSIS — Z5112 Encounter for antineoplastic immunotherapy: Secondary | ICD-10-CM

## 2016-01-29 MED ORDER — DEXAMETHASONE 4 MG PO TABS: 4.0000 mg | ORAL_TABLET | ORAL | Status: DC

## 2016-01-29 MED ORDER — PROCHLORPERAZINE MALEATE 10 MG PO TABS
10.0000 mg | ORAL_TABLET | Freq: Once | ORAL | Status: AC
  Administered 2016-01-29: 10 mg via ORAL

## 2016-01-29 MED ORDER — ZOLEDRONIC ACID 4 MG/5ML IV CONC
3.5000 mg | Freq: Once | INTRAVENOUS | Status: AC
  Administered 2016-01-29: 3.5 mg via INTRAVENOUS
  Filled 2016-01-29: qty 4.38

## 2016-01-29 MED ORDER — SODIUM CHLORIDE 0.9 % IV SOLN
Freq: Once | INTRAVENOUS | Status: AC
  Administered 2016-01-29: 11:00:00 via INTRAVENOUS

## 2016-01-29 MED ORDER — DEXAMETHASONE 4 MG PO TABS
ORAL_TABLET | ORAL | Status: AC
  Filled 2016-01-29: qty 5

## 2016-01-29 MED ORDER — ONDANSETRON HCL 8 MG PO TABS: 8.0000 mg | ORAL_TABLET | Freq: Two times a day (BID) | ORAL | Status: DC | PRN

## 2016-01-29 MED ORDER — ACYCLOVIR 400 MG PO TABS: 400.0000 mg | ORAL_TABLET | Freq: Every day | ORAL | Status: DC

## 2016-01-29 MED ORDER — DEXAMETHASONE 4 MG PO TABS
20.0000 mg | ORAL_TABLET | Freq: Once | ORAL | Status: AC
  Administered 2016-01-29: 20 mg via ORAL

## 2016-01-29 MED ORDER — BORTEZOMIB CHEMO SQ INJECTION 3.5 MG (2.5MG/ML)
1.3000 mg/m2 | Freq: Once | INTRAMUSCULAR | Status: AC
  Administered 2016-01-29: 2.25 mg via SUBCUTANEOUS
  Filled 2016-01-29: qty 2.25

## 2016-01-29 MED ORDER — PROCHLORPERAZINE MALEATE 10 MG PO TABS: 10.0000 mg | ORAL_TABLET | Freq: Four times a day (QID) | ORAL | Status: DC | PRN

## 2016-01-29 MED ORDER — PROCHLORPERAZINE MALEATE 10 MG PO TABS
ORAL_TABLET | ORAL | Status: AC
  Filled 2016-01-29: qty 1

## 2016-01-29 MED FILL — ONDANSETRON HCL 8 MG TABLET: 8 | 15 days supply | Qty: 30 | Fill #0

## 2016-01-29 MED FILL — PROCHLORPERAZINE 10 MG TAB: 10 | 7 days supply | Qty: 30 | Fill #0

## 2016-01-29 MED FILL — ACYCLOVIR 400 MG TABLET: 400 | 60 days supply | Qty: 60 | Fill #0

## 2016-01-29 MED FILL — DEXAMETHASONE 4 MG TABLET: 4 | 84 days supply | Qty: 60 | Fill #0

## 2016-01-29 NOTE — Progress Notes (Signed)
[facility] OFFICE PROGRESS NOTE  Patient Care Team: Susy Frizzle, MD as PCP - General (Family Medicine)  SUMMARY OF ONCOLOGIC HISTORY:   Multiple myeloma ([place])   01/09/2016 Imaging MRI back showed Interval development of diffuse bone marrow infiltration by innumerable small lesions most consistent with multiple myeloma   01/23/2016 Bone Marrow Biopsy Accession: [id number] Bone marrow biopsy showed 51% plasma cells   01/23/2016 Imaging Skeletal survey showed lytic lesions    INTERVAL HISTORY: Please see below for problem oriented charting. She feels well apart from minor back pain. Denies recent infection. She feels fatigued  REVIEW OF SYSTEMS:   Constitutional: Denies fevers, chills or abnormal weight loss Eyes: Denies blurriness of vision Ears, nose, mouth, throat, and face: Denies mucositis or sore throat Respiratory: Denies cough, dyspnea or wheezes Cardiovascular: Denies palpitation, chest discomfort or lower extremity swelling Gastrointestinal:  Denies nausea, heartburn or change in bowel habits Skin: Denies abnormal skin rashes Lymphatics: Denies new lymphadenopathy or easy bruising Neurological:Denies numbness, tingling or new weaknesses Behavioral/Psych: Mood is stable, no new changes  All other systems were reviewed with the patient and are negative.  I have reviewed the past medical history, past surgical history, social history and family history with the patient and they are unchanged from previous note.  ALLERGIES:  is allergic to pravastatin and zocor.  MEDICATIONS:  Current Outpatient Prescriptions  Medication Sig Dispense Refill  . cyclobenzaprine (FLEXERIL) 5 MG tablet Take 1 tablet (5 mg total) by mouth 2 (two) times daily as needed for muscle spasms (muscle cramps). 60 tablet 0  . lenalidomide (REVLIMID) 5 MG capsule Take 1 capsule daily for 14 days then rest 7 days 14 capsule 0  . lisinopril (PRINIVIL,ZESTRIL) 40 MG tablet Take 1 tablet (40 mg  total) by mouth daily. 30 tablet 11  . oxyCODONE-acetaminophen (ROXICET) 5-325 MG tablet Take 1 tablet by mouth every 6 (six) hours as needed for severe pain. 90 tablet 0  . acyclovir (ZOVIRAX) 400 MG tablet Take 1 tablet (400 mg total) by mouth daily. 60 tablet 3  . dexamethasone (DECADRON) 4 MG tablet Take 1 tablet (4 mg total) by mouth once a week. Take 5 tablets a week on Tuesdays 90 tablet 0  . ondansetron (ZOFRAN) 8 MG tablet Take 1 tablet (8 mg total) by mouth 2 (two) times daily as needed (Nausea or vomiting). 30 tablet 1  . prochlorperazine (COMPAZINE) 10 MG tablet Take 1 tablet (10 mg total) by mouth every 6 (six) hours as needed (Nausea or vomiting). 30 tablet 1   No current facility-administered medications for this visit.    PHYSICAL EXAMINATION: ECOG PERFORMANCE STATUS: 1 - Symptomatic but completely ambulatory  Filed Vitals:   01/29/16 0845  BP: 104/44  Pulse: 67  Temp: 97.9 F (36.6 C)  Resp: 17   Filed Weights   01/29/16 0845  Weight: 135 lb 12.8 oz (61.598 kg)    GENERAL:alert, no distress and comfortable. She looks thin SKIN: skin color, texture, turgor are normal, no rashes or significant lesions EYES: normal, Conjunctiva are pink and non-injected, sclera clear Musculoskeletal:no cyanosis of digits and no clubbing  NEURO: alert & oriented x 3 with fluent speech, no focal motor/sensory deficits  LABORATORY DATA:  I have reviewed the data as listed    Component Value Date/Time   NA 142 01/23/2016 1014   NA 139 01/17/2016 0910   K 4.3 01/23/2016 1014   K 4.5 01/17/2016 0910   CL 102 01/17/2016 0910  CO2 25 01/23/2016 1014   CO2 26 01/17/2016 0910   GLUCOSE 116 01/23/2016 1014   GLUCOSE 103* 01/17/2016 0910   BUN 23.2 01/23/2016 1014   BUN 20 01/17/2016 0910   CREATININE 0.9 01/23/2016 1014   CREATININE 0.87 01/17/2016 0910   CREATININE 1.03 08/16/2010 2050   CALCIUM 9.2 01/23/2016 1014   CALCIUM 9.3 01/17/2016 0910   PROT 8.6* 01/23/2016 1014    PROT 8.1 01/23/2016 1014   PROT 7.9 01/17/2016 0910   ALBUMIN 3.6 01/23/2016 1014   ALBUMIN 3.8 01/17/2016 0910   AST 16 01/23/2016 1014   AST 17 01/17/2016 0910   ALT 12 01/23/2016 1014   ALT 11 01/17/2016 0910   ALKPHOS 53 01/23/2016 1014   ALKPHOS 45 01/17/2016 0910   BILITOT 0.35 01/23/2016 1014   BILITOT 0.4 01/17/2016 0910   GFRNONAA 64 01/17/2016 0910   GFRNONAA 53* 08/16/2010 2050   GFRAA 74 01/17/2016 0910   GFRAA  08/16/2010 2050    >60        The eGFR has been calculated using the MDRD equation. This calculation has not been validated in all clinical situations. eGFR's persistently <60 mL/min signify possible Chronic Kidney Disease.    No results found for: SPEP, UPEP  Lab Results  Component Value Date   WBC 3.9 01/23/2016   NEUTROABS 2.0 01/23/2016   HGB 10.6* 01/23/2016   HCT 32.2* 01/23/2016   MCV 95.8 01/23/2016   PLT 176 01/23/2016      Chemistry      Component Value Date/Time   NA 142 01/23/2016 1014   NA 139 01/17/2016 0910   K 4.3 01/23/2016 1014   K 4.5 01/17/2016 0910   CL 102 01/17/2016 0910   CO2 25 01/23/2016 1014   CO2 26 01/17/2016 0910   BUN 23.2 01/23/2016 1014   BUN 20 01/17/2016 0910   CREATININE 0.9 01/23/2016 1014   CREATININE 0.87 01/17/2016 0910   CREATININE 1.03 08/16/2010 2050      Component Value Date/Time   CALCIUM 9.2 01/23/2016 1014   CALCIUM 9.3 01/17/2016 0910   ALKPHOS 53 01/23/2016 1014   ALKPHOS 45 01/17/2016 0910   AST 16 01/23/2016 1014   AST 17 01/17/2016 0910   ALT 12 01/23/2016 1014   ALT 11 01/17/2016 0910   BILITOT 0.35 01/23/2016 1014   BILITOT 0.4 01/17/2016 0910     I reviewed the results of bone marrow biopsy with the family  RADIOGRAPHIC STUDIES:I reviewed the skeletal survey I have personally reviewed the radiological images as listed and agreed with the findings in the report.    ASSESSMENT & PLAN:  Multiple myeloma ([place]) I review all test results with the patient and family  members. The patient have diagnosis of multiple myeloma. She is not a candidate for bone marrow transplant for now. She appears frail. I will start her on palliative induction chemotherapy with dexamethasone, Revlimid and Velcade along with every three-month Zometa. We discussed the risk, benefit, side effects of treatment including pancytopenia, risk of neuropathy, skin rash, risk of infection, risk of blood clots, etc. and she agreed to proceed. I will see her prior to cycle 2 of treatment. She will start acyclovir daily for antimicrobial prophylaxis along with calcium, vitamin D supplement and aspirin  Hypotension due to drugs Her blood pressure is still low. I recommend discontinuation of amlodipine. She had discontinue hydrochlorothiazide last week.   Orders Placed This Encounter  Procedures  . CBC with Differential    Standing  Status: Standing     Number of Occurrences: 20     Standing Expiration Date: 01/29/2017  . Comprehensive metabolic panel    Standing Status: Standing     Number of Occurrences: 20     Standing Expiration Date: 01/29/2017   All questions were answered. The patient knows to call the clinic with any problems, questions or concerns. No barriers to learning was detected. I spent 30 minutes counseling the patient face to face. The total time spent in the appointment was 40 minutes and more than 50% was on counseling and review of test results     [facility], [place], MD 01/29/2016 10:35 AM

## 2016-01-29 NOTE — Progress Notes (Signed)
Pt is approved for the [place].

## 2016-01-29 NOTE — Patient Instructions (Signed)
Bortezomib injection What is this medicine? BORTEZOMIB (bor TEZ oh mib) is a medicine that targets proteins in cancer cells and stops the cancer cells from growing. It is used to treat multiple myeloma and mantle-cell lymphoma. This medicine may be used for other purposes; ask your health care provider or pharmacist if you have questions. What should I tell my health care provider before I take this medicine? They need to know if you have any of these conditions: -diabetes -heart disease -irregular heartbeat -liver disease -on hemodialysis -low blood counts, like low white blood cells, platelets, or hemoglobin -peripheral neuropathy -taking medicine for blood pressure -an unusual or allergic reaction to bortezomib, mannitol, boron, other medicines, foods, dyes, or preservatives -pregnant or trying to get pregnant -breast-feeding How should I use this medicine? This medicine is for injection into a vein or for injection under the skin. It is given by a health care professional in a hospital or clinic setting. Talk to your pediatrician regarding the use of this medicine in children. Special care may be needed. Overdosage: If you think you have taken too much of this medicine contact a poison control center or emergency room at once. NOTE: This medicine is only for you. Do not share this medicine with others. What if I miss a dose? It is important not to miss your dose. Call your doctor or health care professional if you are unable to keep an appointment. What may interact with this medicine? This medicine may interact with the following medications: -ketoconazole -rifampin -ritonavir -St. John's Wort This list may not describe all possible interactions. Give your health care provider a list of all the medicines, herbs, non-prescription drugs, or dietary supplements you use. Also tell them if you smoke, drink alcohol, or use illegal drugs. Some items may interact with your medicine. What  should I watch for while using this medicine? Visit your doctor for checks on your progress. This drug may make you feel generally unwell. This is not uncommon, as chemotherapy can affect healthy cells as well as cancer cells. Report any side effects. Continue your course of treatment even though you feel ill unless your doctor tells you to stop. You may get drowsy or dizzy. Do not drive, use machinery, or do anything that needs mental alertness until you know how this medicine affects you. Do not stand or sit up quickly, especially if you are an older patient. This reduces the risk of dizzy or fainting spells. In some cases, you may be given additional medicines to help with side effects. Follow all directions for their use. Call your doctor or health care professional for advice if you get a fever, chills or sore throat, or other symptoms of a cold or flu. Do not treat yourself. This drug decreases your body's ability to fight infections. Try to avoid being around people who are sick. This medicine may increase your risk to bruise or bleed. Call your doctor or health care professional if you notice any unusual bleeding. You may need blood work done while you are taking this medicine. In some patients, this medicine may cause a serious brain infection that may cause death. If you have any problems seeing, thinking, speaking, walking, or standing, tell your doctor right away. If you cannot reach your doctor, urgently seek other source of medical care. Do not become pregnant while taking this medicine. Women should inform their doctor if they wish to become pregnant or think they might be pregnant. There is a potential for serious  side effects to an unborn child. Talk to your health care professional or pharmacist for more information. Do not breast-feed an infant while taking this medicine. Check with your doctor or health care professional if you get an attack of severe diarrhea, nausea and vomiting, or if  you sweat a lot. The loss of too much body fluid can make it dangerous for you to take this medicine. What side effects may I notice from receiving this medicine? Side effects that you should report to your doctor or health care professional as soon as possible: -allergic reactions like skin rash, itching or hives, swelling of the face, lips, or tongue -breathing problems -changes in hearing -changes in vision -fast, irregular heartbeat -feeling faint or lightheaded, falls -pain, tingling, numbness in the hands or feet -right upper belly pain -seizures -swelling of the ankles, feet, hands -unusual bleeding or bruising -unusually weak or tired -vomiting -yellowing of the eyes or skin Side effects that usually do not require medical attention (report to your doctor or health care professional if they continue or are bothersome): -changes in emotions or moods -constipation -diarrhea -loss of appetite -headache -irritation at site where injected -nausea This list may not describe all possible side effects. Call your doctor for medical advice about side effects. You may report side effects to FDA at 1-800-FDA-1088. Where should I keep my medicine? This drug is given in a hospital or clinic and will not be stored at home. NOTE: This sheet is a summary. It may not cover all possible information. If you have questions about this medicine, talk to your doctor, pharmacist, or health care provider.    2016, Elsevier/Gold Standard. (2015-01-02 14:47:04) Zoledronic Acid injection (Hypercalcemia, Oncology) What is this medicine? ZOLEDRONIC ACID (ZOE le dron ik AS id) lowers the amount of calcium loss from bone. It is used to treat too much calcium in your blood from cancer. It is also used to prevent complications of cancer that has spread to the bone. This medicine may be used for other purposes; ask your health care provider or pharmacist if you have questions. What should I tell my health  care provider before I take this medicine? They need to know if you have any of these conditions: -aspirin-sensitive asthma -cancer, especially if you are receiving medicines used to treat cancer -dental disease or wear dentures -infection -kidney disease -receiving corticosteroids like dexamethasone or prednisone -an unusual or allergic reaction to zoledronic acid, other medicines, foods, dyes, or preservatives -pregnant or trying to get pregnant -breast-feeding How should I use this medicine? This medicine is for infusion into a vein. It is given by a health care professional in a hospital or clinic setting. Talk to your pediatrician regarding the use of this medicine in children. Special care may be needed. Overdosage: If you think you have taken too much of this medicine contact a poison control center or emergency room at once. NOTE: This medicine is only for you. Do not share this medicine with others. What if I miss a dose? It is important not to miss your dose. Call your doctor or health care professional if you are unable to keep an appointment. What may interact with this medicine? -certain antibiotics given by injection -NSAIDs, medicines for pain and inflammation, like ibuprofen or naproxen -some diuretics like bumetanide, furosemide -teriparatide -thalidomide This list may not describe all possible interactions. Give your health care provider a list of all the medicines, herbs, non-prescription drugs, or dietary supplements you use. Also tell them   if you smoke, drink alcohol, or use illegal drugs. Some items may interact with your medicine. What should I watch for while using this medicine? Visit your doctor or health care professional for regular checkups. It may be some time before you see the benefit from this medicine. Do not stop taking your medicine unless your doctor tells you to. Your doctor may order blood tests or other tests to see how you are doing. Women should  inform their doctor if they wish to become pregnant or think they might be pregnant. There is a potential for serious side effects to an unborn child. Talk to your health care professional or pharmacist for more information. You should make sure that you get enough calcium and vitamin D while you are taking this medicine. Discuss the foods you eat and the vitamins you take with your health care professional. Some people who take this medicine have severe bone, joint, and/or muscle pain. This medicine may also increase your risk for jaw problems or a broken thigh bone. Tell your doctor right away if you have severe pain in your jaw, bones, joints, or muscles. Tell your doctor if you have any pain that does not go away or that gets worse. Tell your dentist and dental surgeon that you are taking this medicine. You should not have major dental surgery while on this medicine. See your dentist to have a dental exam and fix any dental problems before starting this medicine. Take good care of your teeth while on this medicine. Make sure you see your dentist for regular follow-up appointments. What side effects may I notice from receiving this medicine? Side effects that you should report to your doctor or health care professional as soon as possible: -allergic reactions like skin rash, itching or hives, swelling of the face, lips, or tongue -anxiety, confusion, or depression -breathing problems -changes in vision -eye pain -feeling faint or lightheaded, falls -jaw pain, especially after dental work -mouth sores -muscle cramps, stiffness, or weakness -redness, blistering, peeling or loosening of the skin, including inside the mouth -trouble passing urine or change in the amount of urine Side effects that usually do not require medical attention (report to your doctor or health care professional if they continue or are bothersome): -bone, joint, or muscle pain -constipation -diarrhea -fever -hair  loss -irritation at site where injected -loss of appetite -nausea, vomiting -stomach upset -trouble sleeping -trouble swallowing -weak or tired This list may not describe all possible side effects. Call your doctor for medical advice about side effects. You may report side effects to FDA at 1-800-FDA-1088. Where should I keep my medicine? This drug is given in a hospital or clinic and will not be stored at home. NOTE: This sheet is a summary. It may not cover all possible information. If you have questions about this medicine, talk to your doctor, pharmacist, or health care provider.    2016, Elsevier/Gold Standard. (2014-04-01 14:19:39)  

## 2016-01-29 NOTE — Assessment & Plan Note (Signed)
Her blood pressure is still low. I recommend discontinuation of amlodipine. She had discontinue hydrochlorothiazide last week.

## 2016-01-29 NOTE — Assessment & Plan Note (Signed)
I review all test results with the patient and family members. The patient have diagnosis of multiple myeloma. She is not a candidate for bone marrow transplant for now. She appears frail. I will start her on palliative induction chemotherapy with dexamethasone, Revlimid and Velcade along with every three-month Zometa. We discussed the risk, benefit, side effects of treatment including pancytopenia, risk of neuropathy, skin rash, risk of infection, risk of blood clots, etc. and she agreed to proceed. I will see her prior to cycle 2 of treatment. She will start acyclovir daily for antimicrobial prophylaxis along with calcium, vitamin D supplement and aspirin

## 2016-01-29 NOTE — Telephone Encounter (Signed)
Gave adn printed appt sched and avs for pt for March thru June

## 2016-01-30 ENCOUNTER — Telehealth: Payer: Self-pay | Admitting: *Deleted

## 2016-01-30 NOTE — Telephone Encounter (Signed)
-----   Message from Ignacia Felling, RN sent at 01/29/2016 10:58 AM EDT ----- Regarding: Dr. Natale Lay Ist vecade and zometa.  Dr. Alvy Bimler

## 2016-01-30 NOTE — Telephone Encounter (Signed)
Called and spoke to patient regarding first Velcade and Zometa. Patient denied nausea, vomiting, diarrhea, pain and mouth sores. Patient is eating and drinking adequately. Instructed patient to call [phone] if she had any questions or concerns. Patient verbalized understanding.

## 2016-02-01 ENCOUNTER — Telehealth: Payer: Self-pay | Admitting: Pharmacist

## 2016-02-01 ENCOUNTER — Ambulatory Visit (HOSPITAL_BASED_OUTPATIENT_CLINIC_OR_DEPARTMENT_OTHER): Payer: Commercial Managed Care - HMO

## 2016-02-01 ENCOUNTER — Encounter: Payer: Self-pay | Admitting: Hematology and Oncology

## 2016-02-01 ENCOUNTER — Telehealth: Payer: Self-pay | Admitting: *Deleted

## 2016-02-01 VITALS — BP 114/51 | HR 72 | Temp 97.5°F | Resp 18

## 2016-02-01 DIAGNOSIS — C9000 Multiple myeloma not having achieved remission: Secondary | ICD-10-CM | POA: Diagnosis not present

## 2016-02-01 DIAGNOSIS — Z5112 Encounter for antineoplastic immunotherapy: Secondary | ICD-10-CM | POA: Diagnosis not present

## 2016-02-01 LAB — TISSUE HYBRIDIZATION (BONE MARROW)-NCBH

## 2016-02-01 LAB — CHROMOSOME ANALYSIS, BONE MARROW

## 2016-02-01 MED ORDER — PROCHLORPERAZINE MALEATE 10 MG PO TABS
ORAL_TABLET | ORAL | Status: AC
  Filled 2016-02-01: qty 1

## 2016-02-01 MED ORDER — PROCHLORPERAZINE MALEATE 10 MG PO TABS
10.0000 mg | ORAL_TABLET | Freq: Once | ORAL | Status: AC
  Administered 2016-02-01: 10 mg via ORAL

## 2016-02-01 MED ORDER — BORTEZOMIB CHEMO SQ INJECTION 3.5 MG (2.5MG/ML)
1.3000 mg/m2 | Freq: Once | INTRAMUSCULAR | Status: AC
  Administered 2016-02-01: 2.25 mg via SUBCUTANEOUS
  Filled 2016-02-01: qty 2.25

## 2016-02-01 NOTE — Telephone Encounter (Signed)
FYI Patient's sister called reporting Revlimid was received today.  Started velcade on 01-29-2016.  Asking advice on when and how to begin this first cycle.  Verbal order received and read back from [place] for Revlimid to be started today and take for eleven days not fourteen which will leave three pills left.  Cannot make up missed days.  Do not take dexamethasone until Tuesday 02-05-2016 and she will provide a new calendar.  Order given to Ms. Wright at this time.

## 2016-02-01 NOTE — Telephone Encounter (Signed)
02/01/16: Attempted to reach patient for follow up on oral medication: Revlimid. No answer. Left VM for patient to call back with any questions or issues.   Thank you,  Montel Clock, PharmD, [facility] [phone]

## 2016-02-01 NOTE — Progress Notes (Signed)
Emailed the [employer] application in behalf of the pt to [employer] for additional assistance.

## 2016-02-01 NOTE — Progress Notes (Signed)
Ok to treat with Labs from 3/8 per Dr. Alvy Bimler.

## 2016-02-01 NOTE — Patient Instructions (Signed)
Bortezomib injection What is this medicine? BORTEZOMIB (bor TEZ oh mib) is a medicine that targets proteins in cancer cells and stops the cancer cells from growing. It is used to treat multiple myeloma and mantle-cell lymphoma. This medicine may be used for other purposes; ask your health care provider or pharmacist if you have questions. What should I tell my health care provider before I take this medicine? They need to know if you have any of these conditions: -diabetes -heart disease -irregular heartbeat -liver disease -on hemodialysis -low blood counts, like low white blood cells, platelets, or hemoglobin -peripheral neuropathy -taking medicine for blood pressure -an unusual or allergic reaction to bortezomib, mannitol, boron, other medicines, foods, dyes, or preservatives -pregnant or trying to get pregnant -breast-feeding How should I use this medicine? This medicine is for injection into a vein or for injection under the skin. It is given by a health care professional in a hospital or clinic setting. Talk to your pediatrician regarding the use of this medicine in children. Special care may be needed. Overdosage: If you think you have taken too much of this medicine contact a poison control center or emergency room at once. NOTE: This medicine is only for you. Do not share this medicine with others. What if I miss a dose? It is important not to miss your dose. Call your doctor or health care professional if you are unable to keep an appointment. What may interact with this medicine? This medicine may interact with the following medications: -ketoconazole -rifampin -ritonavir -St. John's Wort This list may not describe all possible interactions. Give your health care provider a list of all the medicines, herbs, non-prescription drugs, or dietary supplements you use. Also tell them if you smoke, drink alcohol, or use illegal drugs. Some items may interact with your medicine. What  should I watch for while using this medicine? Visit your doctor for checks on your progress. This drug may make you feel generally unwell. This is not uncommon, as chemotherapy can affect healthy cells as well as cancer cells. Report any side effects. Continue your course of treatment even though you feel ill unless your doctor tells you to stop. You may get drowsy or dizzy. Do not drive, use machinery, or do anything that needs mental alertness until you know how this medicine affects you. Do not stand or sit up quickly, especially if you are an older patient. This reduces the risk of dizzy or fainting spells. In some cases, you may be given additional medicines to help with side effects. Follow all directions for their use. Call your doctor or health care professional for advice if you get a fever, chills or sore throat, or other symptoms of a cold or flu. Do not treat yourself. This drug decreases your body's ability to fight infections. Try to avoid being around people who are sick. This medicine may increase your risk to bruise or bleed. Call your doctor or health care professional if you notice any unusual bleeding. You may need blood work done while you are taking this medicine. In some patients, this medicine may cause a serious brain infection that may cause death. If you have any problems seeing, thinking, speaking, walking, or standing, tell your doctor right away. If you cannot reach your doctor, urgently seek other source of medical care. Do not become pregnant while taking this medicine. Women should inform their doctor if they wish to become pregnant or think they might be pregnant. There is a potential for serious  side effects to an unborn child. Talk to your health care professional or pharmacist for more information. Do not breast-feed an infant while taking this medicine. Check with your doctor or health care professional if you get an attack of severe diarrhea, nausea and vomiting, or if  you sweat a lot. The loss of too much body fluid can make it dangerous for you to take this medicine. What side effects may I notice from receiving this medicine? Side effects that you should report to your doctor or health care professional as soon as possible: -allergic reactions like skin rash, itching or hives, swelling of the face, lips, or tongue -breathing problems -changes in hearing -changes in vision -fast, irregular heartbeat -feeling faint or lightheaded, falls -pain, tingling, numbness in the hands or feet -right upper belly pain -seizures -swelling of the ankles, feet, hands -unusual bleeding or bruising -unusually weak or tired -vomiting -yellowing of the eyes or skin Side effects that usually do not require medical attention (report to your doctor or health care professional if they continue or are bothersome): -changes in emotions or moods -constipation -diarrhea -loss of appetite -headache -irritation at site where injected -nausea This list may not describe all possible side effects. Call your doctor for medical advice about side effects. You may report side effects to FDA at 1-800-FDA-1088. Where should I keep my medicine? This drug is given in a hospital or clinic and will not be stored at home. NOTE: This sheet is a summary. It may not cover all possible information. If you have questions about this medicine, talk to your doctor, pharmacist, or health care provider.    2016, Elsevier/Gold Standard. (2015-01-02 14:47:04)

## 2016-02-05 ENCOUNTER — Other Ambulatory Visit (HOSPITAL_BASED_OUTPATIENT_CLINIC_OR_DEPARTMENT_OTHER): Payer: Commercial Managed Care - HMO

## 2016-02-05 ENCOUNTER — Ambulatory Visit (HOSPITAL_BASED_OUTPATIENT_CLINIC_OR_DEPARTMENT_OTHER): Payer: Commercial Managed Care - HMO

## 2016-02-05 VITALS — BP 138/64 | HR 76 | Temp 97.9°F | Resp 18

## 2016-02-05 DIAGNOSIS — Z5112 Encounter for antineoplastic immunotherapy: Secondary | ICD-10-CM

## 2016-02-05 DIAGNOSIS — C9000 Multiple myeloma not having achieved remission: Secondary | ICD-10-CM

## 2016-02-05 LAB — CBC WITH DIFFERENTIAL/PLATELET
BASO%: 0.5 % (ref 0.0–2.0)
Basophils Absolute: 0 10*3/uL (ref 0.0–0.1)
EOS%: 4.6 % (ref 0.0–7.0)
Eosinophils Absolute: 0.2 10*3/uL (ref 0.0–0.5)
HEMATOCRIT: 30 % — AB (ref 34.8–46.6)
HEMOGLOBIN: 9.7 g/dL — AB (ref 11.6–15.9)
LYMPH%: 35.5 % (ref 14.0–49.7)
MCH: 31.3 pg (ref 25.1–34.0)
MCHC: 32.3 g/dL (ref 31.5–36.0)
MCV: 96.8 fL (ref 79.5–101.0)
MONO#: 0.4 10*3/uL (ref 0.1–0.9)
MONO%: 9.5 % (ref 0.0–14.0)
NEUT%: 49.9 % (ref 38.4–76.8)
NEUTROS ABS: 1.8 10*3/uL (ref 1.5–6.5)
Platelets: 157 10*3/uL (ref 145–400)
RBC: 3.1 10*6/uL — ABNORMAL LOW (ref 3.70–5.45)
RDW: 18.4 % — ABNORMAL HIGH (ref 11.2–14.5)
WBC: 3.7 10*3/uL — AB (ref 3.9–10.3)
lymph#: 1.3 10*3/uL (ref 0.9–3.3)
nRBC: 0 % (ref 0–0)

## 2016-02-05 LAB — COMPREHENSIVE METABOLIC PANEL
ALBUMIN: 3.3 g/dL — AB (ref 3.5–5.0)
ALT: 16 U/L (ref 0–55)
AST: 16 U/L (ref 5–34)
Alkaline Phosphatase: 48 U/L (ref 40–150)
Anion Gap: 9 mEq/L (ref 3–11)
BUN: 8.4 mg/dL (ref 7.0–26.0)
CALCIUM: 7.9 mg/dL — AB (ref 8.4–10.4)
CHLORIDE: 109 meq/L (ref 98–109)
CO2: 24 mEq/L (ref 22–29)
CREATININE: 0.8 mg/dL (ref 0.6–1.1)
EGFR: 74 mL/min/{1.73_m2} — ABNORMAL LOW (ref >90)
GLUCOSE: 83 mg/dL (ref 70–140)
Potassium: 4.2 mEq/L (ref 3.5–5.1)
SODIUM: 142 meq/L (ref 136–145)
Total Bilirubin: <0.3 mg/dL (ref 0.20–1.20)
Total Protein: 7.7 g/dL (ref 6.4–8.3)

## 2016-02-05 MED ORDER — BORTEZOMIB CHEMO SQ INJECTION 3.5 MG (2.5MG/ML)
1.3000 mg/m2 | Freq: Once | INTRAMUSCULAR | Status: AC
  Administered 2016-02-05: 2.25 mg via SUBCUTANEOUS
  Filled 2016-02-05: qty 2.25

## 2016-02-05 MED ORDER — PROCHLORPERAZINE MALEATE 10 MG PO TABS
10.0000 mg | ORAL_TABLET | Freq: Once | ORAL | Status: AC
  Administered 2016-02-05: 10 mg via ORAL

## 2016-02-05 MED ORDER — PROCHLORPERAZINE MALEATE 10 MG PO TABS
ORAL_TABLET | ORAL | Status: AC
  Filled 2016-02-05: qty 1

## 2016-02-05 NOTE — Patient Instructions (Signed)
[facility] Discharge Instructions for Patients Receiving Chemotherapy  Today you received the following chemotherapy agents:  Velcade  To help prevent nausea and vomiting after your treatment, we encourage you to take your nausea medication as prescribed.   If you develop nausea and vomiting that is not controlled by your nausea medication, call the clinic.   BELOW ARE SYMPTOMS THAT SHOULD BE REPORTED IMMEDIATELY:  *FEVER GREATER THAN 100.5 F  *CHILLS WITH OR WITHOUT FEVER  NAUSEA AND VOMITING THAT IS NOT CONTROLLED WITH YOUR NAUSEA MEDICATION  *UNUSUAL SHORTNESS OF BREATH  *UNUSUAL BRUISING OR BLEEDING  TENDERNESS IN MOUTH AND THROAT WITH OR WITHOUT PRESENCE OF ULCERS  *URINARY PROBLEMS  *BOWEL PROBLEMS  UNUSUAL RASH Items with * indicate a potential emergency and should be followed up as soon as possible.  Feel free to call the clinic you have any questions or concerns. The clinic phone number is [phone].  Please show the CHEMO ALERT CARD at check-in to the Emergency Department and triage nurse.   

## 2016-02-06 ENCOUNTER — Ambulatory Visit: Payer: Commercial Managed Care - HMO | Admitting: Hematology and Oncology

## 2016-02-07 ENCOUNTER — Telehealth: Payer: Self-pay | Admitting: Pharmacist

## 2016-02-07 NOTE — Telephone Encounter (Signed)
02/07/16: Attempted to reach patient for follow up on oral medication: Revlimid. No answer. Left VM for patient/patient's sister, Teressa Senter, to call back with any questions or issues.   Thank you,  Montel Clock, PharmD, [facility] [phone]

## 2016-02-08 ENCOUNTER — Telehealth: Payer: Self-pay | Admitting: *Deleted

## 2016-02-08 ENCOUNTER — Ambulatory Visit (HOSPITAL_BASED_OUTPATIENT_CLINIC_OR_DEPARTMENT_OTHER): Payer: Commercial Managed Care - HMO

## 2016-02-08 VITALS — BP 135/72 | HR 70 | Temp 97.0°F | Resp 16

## 2016-02-08 DIAGNOSIS — C9000 Multiple myeloma not having achieved remission: Secondary | ICD-10-CM | POA: Diagnosis not present

## 2016-02-08 DIAGNOSIS — Z5112 Encounter for antineoplastic immunotherapy: Secondary | ICD-10-CM | POA: Diagnosis not present

## 2016-02-08 MED ORDER — PROCHLORPERAZINE MALEATE 10 MG PO TABS
ORAL_TABLET | ORAL | Status: AC
  Filled 2016-02-08: qty 1

## 2016-02-08 MED ORDER — PROCHLORPERAZINE MALEATE 10 MG PO TABS
10.0000 mg | ORAL_TABLET | Freq: Once | ORAL | Status: AC
  Administered 2016-02-08: 10 mg via ORAL

## 2016-02-08 MED ORDER — BORTEZOMIB CHEMO SQ INJECTION 3.5 MG (2.5MG/ML)
1.3000 mg/m2 | Freq: Once | INTRAMUSCULAR | Status: AC
  Administered 2016-02-08: 2.25 mg via SUBCUTANEOUS
  Filled 2016-02-08: qty 2.25

## 2016-02-08 NOTE — Telephone Encounter (Signed)
Pt on schedule for chemo next week 3/28 and 3/31.   She completed her first cycle today and not due to start next cycle until 4/4 per Dr. Calton Dach treatment plan..  Canceled her appts for next week and LVM for pt's sister informing of next appt on 4/4,  Do not need to come next week.  I also s/w pt and she wrote it on her calendar, verbalized understanding.

## 2016-02-08 NOTE — Patient Instructions (Signed)
[facility] Discharge Instructions for Patients Receiving Chemotherapy  Today you received the following chemotherapy agents:  Velcade  To help prevent nausea and vomiting after your treatment, we encourage you to take your nausea medication as prescribed.   If you develop nausea and vomiting that is not controlled by your nausea medication, call the clinic.   BELOW ARE SYMPTOMS THAT SHOULD BE REPORTED IMMEDIATELY:  *FEVER GREATER THAN 100.5 F  *CHILLS WITH OR WITHOUT FEVER  NAUSEA AND VOMITING THAT IS NOT CONTROLLED WITH YOUR NAUSEA MEDICATION  *UNUSUAL SHORTNESS OF BREATH  *UNUSUAL BRUISING OR BLEEDING  TENDERNESS IN MOUTH AND THROAT WITH OR WITHOUT PRESENCE OF ULCERS  *URINARY PROBLEMS  *BOWEL PROBLEMS  UNUSUAL RASH Items with * indicate a potential emergency and should be followed up as soon as possible.  Feel free to call the clinic you have any questions or concerns. The clinic phone number is [phone].  Please show the CHEMO ALERT CARD at check-in to the Emergency Department and triage nurse.   

## 2016-02-11 ENCOUNTER — Other Ambulatory Visit: Payer: Self-pay | Admitting: Hematology and Oncology

## 2016-02-12 ENCOUNTER — Ambulatory Visit: Payer: Commercial Managed Care - HMO

## 2016-02-14 ENCOUNTER — Other Ambulatory Visit: Payer: Self-pay | Admitting: *Deleted

## 2016-02-14 MED ORDER — LENALIDOMIDE 5 MG PO CAPS: ORAL_CAPSULE | ORAL | Status: DC

## 2016-02-15 ENCOUNTER — Ambulatory Visit: Payer: Commercial Managed Care - HMO

## 2016-02-18 ENCOUNTER — Other Ambulatory Visit: Payer: Self-pay | Admitting: Hematology and Oncology

## 2016-02-19 ENCOUNTER — Ambulatory Visit (HOSPITAL_BASED_OUTPATIENT_CLINIC_OR_DEPARTMENT_OTHER): Payer: Commercial Managed Care - HMO

## 2016-02-19 ENCOUNTER — Ambulatory Visit: Payer: Commercial Managed Care - HMO

## 2016-02-19 ENCOUNTER — Ambulatory Visit (HOSPITAL_BASED_OUTPATIENT_CLINIC_OR_DEPARTMENT_OTHER): Payer: Commercial Managed Care - HMO | Admitting: Hematology and Oncology

## 2016-02-19 ENCOUNTER — Telehealth: Payer: Self-pay | Admitting: Hematology and Oncology

## 2016-02-19 ENCOUNTER — Encounter: Payer: Self-pay | Admitting: Hematology and Oncology

## 2016-02-19 ENCOUNTER — Other Ambulatory Visit (HOSPITAL_BASED_OUTPATIENT_CLINIC_OR_DEPARTMENT_OTHER): Payer: Commercial Managed Care - HMO

## 2016-02-19 VITALS — BP 103/47 | HR 71 | Temp 98.1°F | Resp 17 | Wt 140.1 lb

## 2016-02-19 DIAGNOSIS — D6481 Anemia due to antineoplastic chemotherapy: Secondary | ICD-10-CM | POA: Diagnosis not present

## 2016-02-19 DIAGNOSIS — Z5112 Encounter for antineoplastic immunotherapy: Secondary | ICD-10-CM

## 2016-02-19 DIAGNOSIS — C9000 Multiple myeloma not having achieved remission: Secondary | ICD-10-CM

## 2016-02-19 DIAGNOSIS — T451X5A Adverse effect of antineoplastic and immunosuppressive drugs, initial encounter: Secondary | ICD-10-CM

## 2016-02-19 DIAGNOSIS — G893 Neoplasm related pain (acute) (chronic): Secondary | ICD-10-CM

## 2016-02-19 DIAGNOSIS — E441 Mild protein-calorie malnutrition: Secondary | ICD-10-CM

## 2016-02-19 LAB — CBC WITH DIFFERENTIAL/PLATELET
BASO%: 1.1 % (ref 0.0–2.0)
Basophils Absolute: 0.1 10*3/uL (ref 0.0–0.1)
EOS ABS: 0.1 10*3/uL (ref 0.0–0.5)
EOS%: 1.1 % (ref 0.0–7.0)
HEMATOCRIT: 28.3 % — AB (ref 34.8–46.6)
HEMOGLOBIN: 9.2 g/dL — AB (ref 11.6–15.9)
LYMPH#: 1.4 10*3/uL (ref 0.9–3.3)
LYMPH%: 31 % (ref 14.0–49.7)
MCH: 31.4 pg (ref 25.1–34.0)
MCHC: 32.5 g/dL (ref 31.5–36.0)
MCV: 96.6 fL (ref 79.5–101.0)
MONO#: 0.8 10*3/uL (ref 0.1–0.9)
MONO%: 17.6 % — ABNORMAL HIGH (ref 0.0–14.0)
NEUT%: 49.2 % (ref 38.4–76.8)
NEUTROS ABS: 2.2 10*3/uL (ref 1.5–6.5)
NRBC: 0 % (ref 0–0)
PLATELETS: 210 10*3/uL (ref 145–400)
RBC: 2.93 10*6/uL — ABNORMAL LOW (ref 3.70–5.45)
RDW: 17.7 % — ABNORMAL HIGH (ref 11.2–14.5)
WBC: 4.5 10*3/uL (ref 3.9–10.3)

## 2016-02-19 LAB — COMPREHENSIVE METABOLIC PANEL
ALBUMIN: 3.1 g/dL — AB (ref 3.5–5.0)
ALK PHOS: 79 U/L (ref 40–150)
ALT: 11 U/L (ref 0–55)
ANION GAP: 8 meq/L (ref 3–11)
AST: 17 U/L (ref 5–34)
BILIRUBIN TOTAL: 0.63 mg/dL (ref 0.20–1.20)
BUN: 12.1 mg/dL (ref 7.0–26.0)
CALCIUM: 8.4 mg/dL (ref 8.4–10.4)
CO2: 21 mEq/L — ABNORMAL LOW (ref 22–29)
CREATININE: 0.8 mg/dL (ref 0.6–1.1)
Chloride: 109 mEq/L (ref 98–109)
EGFR: 70 mL/min/{1.73_m2} — ABNORMAL LOW (ref >90)
Glucose: 114 mg/dl (ref 70–140)
Potassium: 4.5 mEq/L (ref 3.5–5.1)
Sodium: 138 mEq/L (ref 136–145)
TOTAL PROTEIN: 6.8 g/dL (ref 6.4–8.3)

## 2016-02-19 MED ORDER — BORTEZOMIB CHEMO SQ INJECTION 3.5 MG (2.5MG/ML)
1.3000 mg/m2 | Freq: Once | INTRAMUSCULAR | Status: AC
  Administered 2016-02-19: 2.25 mg via SUBCUTANEOUS
  Filled 2016-02-19: qty 2.25

## 2016-02-19 MED ORDER — PROCHLORPERAZINE MALEATE 10 MG PO TABS
ORAL_TABLET | ORAL | Status: AC
  Filled 2016-02-19: qty 1

## 2016-02-19 MED ORDER — PROCHLORPERAZINE MALEATE 10 MG PO TABS
10.0000 mg | ORAL_TABLET | Freq: Once | ORAL | Status: AC
  Administered 2016-02-19: 10 mg via ORAL

## 2016-02-19 NOTE — Telephone Encounter (Signed)
Gave and printed appt sched and avs for pt for April and May °

## 2016-02-19 NOTE — Patient Instructions (Signed)
[facility] Discharge Instructions for Patients Receiving Chemotherapy  Today you received the following chemotherapy agents Velcade. To help prevent nausea and vomiting after your treatment, we encourage you to take your nausea medication as directed.  If you develop nausea and vomiting that is not controlled by your nausea medication, call the clinic.   BELOW ARE SYMPTOMS THAT SHOULD BE REPORTED IMMEDIATELY:  *FEVER GREATER THAN 100.5 F  *CHILLS WITH OR WITHOUT FEVER  NAUSEA AND VOMITING THAT IS NOT CONTROLLED WITH YOUR NAUSEA MEDICATION  *UNUSUAL SHORTNESS OF BREATH  *UNUSUAL BRUISING OR BLEEDING  TENDERNESS IN MOUTH AND THROAT WITH OR WITHOUT PRESENCE OF ULCERS  *URINARY PROBLEMS  *BOWEL PROBLEMS  UNUSUAL RASH Items with * indicate a potential emergency and should be followed up as soon as possible.  Feel free to call the clinic you have any questions or concerns. The clinic phone number is [phone].  Please show the CHEMO ALERT CARD at check-in to the Emergency Department and triage nurse.    

## 2016-02-19 NOTE — Assessment & Plan Note (Signed)
So far, she tolerated treatment well without major side effects. We will continue palliative induction chemotherapy with dexamethasone, Revlimid and Velcade along with every three-month Zometa. I will see her prior to cycle 3 of treatment. She will continue acyclovir daily for antimicrobial prophylaxis along with calcium, vitamin D supplement and aspirin

## 2016-02-19 NOTE — Assessment & Plan Note (Signed)
Her pain is mildly improved since we begun treatment. She will continue to take the medication as needed for pain. She does not need radiation therapy for now.

## 2016-02-19 NOTE — Progress Notes (Signed)
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 She tolerated treatment well. She has mild skin discoloration from Velcade. She continues to have persistent back pain, minimum change compared to prior visit  REVIEW OF SYSTEMS:   Constitutional: Denies fevers, chills or abnormal weight loss Eyes: Denies blurriness of vision Ears, nose, mouth, throat, and face: Denies mucositis or sore throat Respiratory: Denies cough, dyspnea or wheezes Cardiovascular: Denies palpitation, chest discomfort or lower extremity swelling Gastrointestinal:  Denies nausea, heartburn or change in bowel habits Skin: Denies abnormal skin rashes Lymphatics: Denies new lymphadenopathy or easy bruising Neurological:Denies numbness, tingling or new weaknesses Behavioral/Psych: Mood is stable, no new changes  All other systems were reviewed with the patient and are negative.  I have reviewed the past medical history, past surgical history, social history and family history with the patient and they are unchanged from previous note.  ALLERGIES:  is allergic to pravastatin and zocor.  MEDICATIONS:  Current Outpatient Prescriptions  Medication Sig Dispense Refill  . acyclovir (ZOVIRAX) 400 MG tablet Take 1 tablet (400 mg total) by mouth  daily. 60 tablet 3  . cyclobenzaprine (FLEXERIL) 5 MG tablet Take 1 tablet (5 mg total) by mouth 2 (two) times daily as needed for muscle spasms (muscle cramps). 60 tablet 0  . dexamethasone (DECADRON) 4 MG tablet Take 1 tablet (4 mg total) by mouth once a week. Take 5 tablets a week on Tuesdays 90 tablet 0  . lenalidomide (REVLIMID) 5 MG capsule Take 1 capsule daily for 14 days then rest 7 days 14 capsule 0  . lisinopril (PRINIVIL,ZESTRIL) 40 MG tablet Take 1 tablet (40 mg total) by mouth daily. 30 tablet 11  . ondansetron (ZOFRAN) 8 MG tablet Take 1 tablet (8 mg total) by mouth 2 (two) times daily as needed (Nausea or vomiting). 30 tablet 1  . oxyCODONE-acetaminophen (ROXICET) 5-325 MG tablet Take 1 tablet by mouth every 6 (six) hours as needed for severe pain. 90 tablet 0  . prochlorperazine (COMPAZINE) 10 MG tablet Take 1 tablet (10 mg total) by mouth every 6 (six) hours as needed (Nausea or vomiting). 30 tablet 1   No current facility-administered medications for this visit.    PHYSICAL EXAMINATION: ECOG PERFORMANCE STATUS: 1 - Symptomatic but completely ambulatory  Filed Vitals:   02/19/16 1002  BP: 103/47  Pulse: 71  Temp: 98.1 F (36.7 C)  Resp: 17   Filed Weights   02/19/16 1002  Weight: 140 lb 1.6 oz (63.549 kg)    GENERAL:alert, no distress and comfortable. She looks thin and frail SKIN: skin color, texture, turgor are normal, no rashes or significant lesions. Noted mild skin discoloration from recent Velcade injection EYES: normal, Conjunctiva are pink and non-injected, sclera clear OROPHARYNX:no exudate, no erythema and lips, buccal mucosa, and tongue normal  NECK: supple, thyroid normal size, non-tender, without nodularity LYMPH:  no palpable lymphadenopathy in the cervical, axillary or inguinal LUNGS: clear to auscultation and percussion with normal breathing effort HEART: regular rate & rhythm and no murmurs and no lower extremity edema ABDOMEN:abdomen soft,  non-tender and normal bowel sounds Musculoskeletal:no cyanosis of digits and no clubbing  NEURO: alert & oriented x 3 with fluent speech, no focal motor/sensory deficits  LABORATORY DATA:  I have reviewed the data as listed    Component Value Date/Time   NA 138 02/19/2016 0947   NA 139 01/17/2016 0910   K 4.5 02/19/2016 0947   K 4.5 01/17/2016 0910   CL 102 01/17/2016 0910   CO2 21* 02/19/2016 0947   CO2 26 01/17/2016 0910   GLUCOSE 114 02/19/2016 0947   GLUCOSE 103* 01/17/2016 0910   BUN 12.1 02/19/2016 0947   BUN 20 01/17/2016 0910   CREATININE 0.8 02/19/2016 0947   CREATININE 0.87 01/17/2016 0910   CREATININE 1.03 08/16/2010 2050   CALCIUM 8.4 02/19/2016 0947   CALCIUM 9.3 01/17/2016 0910   PROT 6.8 02/19/2016 0947   PROT 8.1 01/23/2016 1014   PROT 7.9 01/17/2016 0910   ALBUMIN 3.1* 02/19/2016 0947   ALBUMIN 3.8 01/17/2016 0910   AST 17 02/19/2016 0947   AST 17 01/17/2016 0910   ALT 11 02/19/2016 0947   ALT 11 01/17/2016 0910   ALKPHOS 79 02/19/2016 0947   ALKPHOS 45 01/17/2016 0910   BILITOT 0.63 02/19/2016 0947   BILITOT 0.4 01/17/2016 0910   GFRNONAA 64 01/17/2016 0910   GFRNONAA 53* 08/16/2010 2050   GFRAA 74 01/17/2016 0910   GFRAA  08/16/2010 2050    >60        The eGFR has been calculated using the MDRD equation. This calculation has not been validated in all clinical situations. eGFR's persistently <60 mL/min signify possible Chronic Kidney Disease.    No results found for: SPEP, UPEP  Lab Results  Component Value Date   WBC 4.5 02/19/2016   NEUTROABS 2.2 02/19/2016   HGB 9.2* 02/19/2016   HCT 28.3* 02/19/2016   MCV 96.6 02/19/2016   PLT 210 02/19/2016      Chemistry      Component Value Date/Time   NA 138 02/19/2016 0947   NA 139 01/17/2016 0910   K 4.5 02/19/2016 0947   K 4.5 01/17/2016 0910   CL 102 01/17/2016 0910   CO2 21* 02/19/2016 0947   CO2 26 01/17/2016 0910   BUN 12.1 02/19/2016 0947   BUN 20 01/17/2016 0910    CREATININE 0.8 02/19/2016 0947   CREATININE 0.87 01/17/2016 0910   CREATININE 1.03 08/16/2010 2050      Component Value Date/Time   CALCIUM 8.4 02/19/2016 0947   CALCIUM 9.3 01/17/2016 0910   ALKPHOS 79 02/19/2016 0947   ALKPHOS 45 01/17/2016 0910   AST 17 02/19/2016 0947   AST 17 01/17/2016 0910   ALT 11 02/19/2016 0947   ALT 11 01/17/2016 0910   BILITOT 0.63 02/19/2016 0947   BILITOT 0.4 01/17/2016 0910     ASSESSMENT & PLAN:  Multiple myeloma without remission ([facility]) So far, she tolerated treatment well without major side effects. We will continue palliative induction chemotherapy with dexamethasone, Revlimid and Velcade along with every three-month Zometa. I will see her prior to cycle 3 of treatment. She will continue acyclovir daily for antimicrobial prophylaxis along with calcium, vitamin D supplement and aspirin  Anemia due to antineoplastic chemotherapy This is likely due to recent treatment. The patient denies recent history of  bleeding such as epistaxis, hematuria or hematochezia. She is asymptomatic from the anemia. I will observe for now.  She does not require transfusion now. I will continue the chemotherapy at current dose without dosage adjustment.  If the anemia gets progressive worse in the future, I might have to delay her treatment or adjust the chemotherapy dose.   Protein-calorie malnutrition, mild ([place]) She has started to gain weight since we begin treatment. Continue the same.  Cancer associated pain Her pain is mildly improved since we begun treatment. She will continue to take the medication as needed for pain. She does not need radiation therapy for now.   Orders Placed This Encounter  Procedures  . Kappa/lambda light chains    Standing Status: Future     Number of Occurrences:      Standing Expiration Date: 03/25/2017  . Multiple Myeloma Panel (SPEP&IFE w/QIG)    Standing Status: Future     Number of Occurrences:      Standing Expiration Date:  03/25/2017   All questions were answered. The patient knows to call the clinic with any problems, questions or concerns. No barriers to learning was detected. I spent 20 minutes counseling the patient face to face. The total time spent in the appointment was 25 minutes and more than 50% was on counseling and review of test results     [facility], [place], MD 02/19/2016 2:55 PM

## 2016-02-19 NOTE — Assessment & Plan Note (Signed)
She has started to gain weight since we begin treatment. Continue the same.

## 2016-02-19 NOTE — Assessment & Plan Note (Signed)

## 2016-02-22 ENCOUNTER — Ambulatory Visit (HOSPITAL_BASED_OUTPATIENT_CLINIC_OR_DEPARTMENT_OTHER): Payer: Commercial Managed Care - HMO

## 2016-02-22 VITALS — BP 120/71 | HR 64 | Temp 97.8°F | Resp 16

## 2016-02-22 DIAGNOSIS — C9000 Multiple myeloma not having achieved remission: Secondary | ICD-10-CM

## 2016-02-22 DIAGNOSIS — Z5112 Encounter for antineoplastic immunotherapy: Secondary | ICD-10-CM | POA: Diagnosis not present

## 2016-02-22 MED ORDER — PROCHLORPERAZINE MALEATE 10 MG PO TABS
10.0000 mg | ORAL_TABLET | Freq: Once | ORAL | Status: AC
  Administered 2016-02-22: 10 mg via ORAL

## 2016-02-22 MED ORDER — PROCHLORPERAZINE MALEATE 10 MG PO TABS
ORAL_TABLET | ORAL | Status: AC
  Filled 2016-02-22: qty 1

## 2016-02-22 MED ORDER — BORTEZOMIB CHEMO SQ INJECTION 3.5 MG (2.5MG/ML)
1.3000 mg/m2 | Freq: Once | INTRAMUSCULAR | Status: AC
  Administered 2016-02-22: 2.25 mg via SUBCUTANEOUS
  Filled 2016-02-22: qty 2.25

## 2016-02-22 NOTE — Patient Instructions (Signed)
[facility] Discharge Instructions for Patients Receiving Chemotherapy  Today you received the following chemotherapy agents velcade   To help prevent nausea and vomiting after your treatment, we encourage you to take your nausea medication as directed  If you develop nausea and vomiting that is not controlled by your nausea medication, call the clinic.   BELOW ARE SYMPTOMS THAT SHOULD BE REPORTED IMMEDIATELY:  *FEVER GREATER THAN 100.5 F  *CHILLS WITH OR WITHOUT FEVER  NAUSEA AND VOMITING THAT IS NOT CONTROLLED WITH YOUR NAUSEA MEDICATION  *UNUSUAL SHORTNESS OF BREATH  *UNUSUAL BRUISING OR BLEEDING  TENDERNESS IN MOUTH AND THROAT WITH OR WITHOUT PRESENCE OF ULCERS  *URINARY PROBLEMS  *BOWEL PROBLEMS  UNUSUAL RASH Items with * indicate a potential emergency and should be followed up as soon as possible.  Feel free to call the clinic you have any questions or concerns. The clinic phone number is [phone].  

## 2016-02-26 ENCOUNTER — Other Ambulatory Visit (HOSPITAL_COMMUNITY): Payer: Self-pay

## 2016-02-26 ENCOUNTER — Other Ambulatory Visit (HOSPITAL_BASED_OUTPATIENT_CLINIC_OR_DEPARTMENT_OTHER): Payer: Commercial Managed Care - HMO

## 2016-02-26 ENCOUNTER — Encounter (HOSPITAL_COMMUNITY): Payer: Self-pay

## 2016-02-26 ENCOUNTER — Ambulatory Visit (HOSPITAL_BASED_OUTPATIENT_CLINIC_OR_DEPARTMENT_OTHER): Payer: Commercial Managed Care - HMO

## 2016-02-26 VITALS — BP 146/63 | HR 59 | Temp 97.6°F | Resp 17

## 2016-02-26 DIAGNOSIS — Z5112 Encounter for antineoplastic immunotherapy: Secondary | ICD-10-CM

## 2016-02-26 DIAGNOSIS — C9000 Multiple myeloma not having achieved remission: Secondary | ICD-10-CM | POA: Diagnosis not present

## 2016-02-26 LAB — COMPREHENSIVE METABOLIC PANEL
ALBUMIN: 3.4 g/dL — AB (ref 3.5–5.0)
ALT: 12 U/L (ref 0–55)
AST: 15 U/L (ref 5–34)
Alkaline Phosphatase: 64 U/L (ref 40–150)
Anion Gap: 9 mEq/L (ref 3–11)
BILIRUBIN TOTAL: 0.51 mg/dL (ref 0.20–1.20)
BUN: 9.7 mg/dL (ref 7.0–26.0)
CALCIUM: 8.4 mg/dL (ref 8.4–10.4)
CO2: 23 mEq/L (ref 22–29)
Chloride: 111 mEq/L — ABNORMAL HIGH (ref 98–109)
Creatinine: 0.7 mg/dL (ref 0.6–1.1)
EGFR: 78 mL/min/{1.73_m2} — ABNORMAL LOW (ref >90)
Glucose: 90 mg/dl (ref 70–140)
Potassium: 4.2 mEq/L (ref 3.5–5.1)
Sodium: 143 mEq/L (ref 136–145)
Total Protein: 6.5 g/dL (ref 6.4–8.3)

## 2016-02-26 LAB — CBC WITH DIFFERENTIAL/PLATELET
BASO%: 0.3 % (ref 0.0–2.0)
Basophils Absolute: 0 10*3/uL (ref 0.0–0.1)
EOS%: 4.7 % (ref 0.0–7.0)
Eosinophils Absolute: 0.2 10*3/uL (ref 0.0–0.5)
HEMATOCRIT: 30.2 % — AB (ref 34.8–46.6)
HEMOGLOBIN: 9.9 g/dL — AB (ref 11.6–15.9)
LYMPH#: 1 10*3/uL (ref 0.9–3.3)
LYMPH%: 31.4 % (ref 14.0–49.7)
MCH: 31.1 pg (ref 25.1–34.0)
MCHC: 32.8 g/dL (ref 31.5–36.0)
MCV: 95 fL (ref 79.5–101.0)
MONO#: 0.2 10*3/uL (ref 0.1–0.9)
MONO%: 7.2 % (ref 0.0–14.0)
NEUT%: 56.4 % (ref 38.4–76.8)
NEUTROS ABS: 1.8 10*3/uL (ref 1.5–6.5)
Platelets: 137 10*3/uL — ABNORMAL LOW (ref 145–400)
RBC: 3.18 10*6/uL — ABNORMAL LOW (ref 3.70–5.45)
RDW: 17.4 % — ABNORMAL HIGH (ref 11.2–14.5)
WBC: 3.2 10*3/uL — AB (ref 3.9–10.3)
nRBC: 2 % — ABNORMAL HIGH (ref 0–0)

## 2016-02-26 MED ORDER — BORTEZOMIB CHEMO SQ INJECTION 3.5 MG (2.5MG/ML)
1.3000 mg/m2 | Freq: Once | INTRAMUSCULAR | Status: AC
  Administered 2016-02-26: 2.25 mg via SUBCUTANEOUS
  Filled 2016-02-26: qty 2.25

## 2016-02-26 MED ORDER — PROCHLORPERAZINE MALEATE 10 MG PO TABS
10.0000 mg | ORAL_TABLET | Freq: Once | ORAL | Status: AC
  Administered 2016-02-26: 10 mg via ORAL

## 2016-02-26 MED ORDER — PROCHLORPERAZINE MALEATE 10 MG PO TABS
ORAL_TABLET | ORAL | Status: AC
  Filled 2016-02-26: qty 1

## 2016-02-26 NOTE — Patient Instructions (Signed)
Bortezomib injection What is this medicine? BORTEZOMIB (bor TEZ oh mib) is a medicine that targets proteins in cancer cells and stops the cancer cells from growing. It is used to treat multiple myeloma and mantle-cell lymphoma. This medicine may be used for other purposes; ask your health care provider or pharmacist if you have questions. What should I tell my health care provider before I take this medicine? They need to know if you have any of these conditions: -diabetes -heart disease -irregular heartbeat -liver disease -on hemodialysis -low blood counts, like low white blood cells, platelets, or hemoglobin -peripheral neuropathy -taking medicine for blood pressure -an unusual or allergic reaction to bortezomib, mannitol, boron, other medicines, foods, dyes, or preservatives -pregnant or trying to get pregnant -breast-feeding How should I use this medicine? This medicine is for injection into a vein or for injection under the skin. It is given by a health care professional in a hospital or clinic setting. Talk to your pediatrician regarding the use of this medicine in children. Special care may be needed. Overdosage: If you think you have taken too much of this medicine contact a poison control center or emergency room at once. NOTE: This medicine is only for you. Do not share this medicine with others. What if I miss a dose? It is important not to miss your dose. Call your doctor or health care professional if you are unable to keep an appointment. What may interact with this medicine? This medicine may interact with the following medications: -ketoconazole -rifampin -ritonavir -St. John's Wort This list may not describe all possible interactions. Give your health care provider a list of all the medicines, herbs, non-prescription drugs, or dietary supplements you use. Also tell them if you smoke, drink alcohol, or use illegal drugs. Some items may interact with your medicine. What  should I watch for while using this medicine? Visit your doctor for checks on your progress. This drug may make you feel generally unwell. This is not uncommon, as chemotherapy can affect healthy cells as well as cancer cells. Report any side effects. Continue your course of treatment even though you feel ill unless your doctor tells you to stop. You may get drowsy or dizzy. Do not drive, use machinery, or do anything that needs mental alertness until you know how this medicine affects you. Do not stand or sit up quickly, especially if you are an older patient. This reduces the risk of dizzy or fainting spells. In some cases, you may be given additional medicines to help with side effects. Follow all directions for their use. Call your doctor or health care professional for advice if you get a fever, chills or sore throat, or other symptoms of a cold or flu. Do not treat yourself. This drug decreases your body's ability to fight infections. Try to avoid being around people who are sick. This medicine may increase your risk to bruise or bleed. Call your doctor or health care professional if you notice any unusual bleeding. You may need blood work done while you are taking this medicine. In some patients, this medicine may cause a serious brain infection that may cause death. If you have any problems seeing, thinking, speaking, walking, or standing, tell your doctor right away. If you cannot reach your doctor, urgently seek other source of medical care. Do not become pregnant while taking this medicine. Women should inform their doctor if they wish to become pregnant or think they might be pregnant. There is a potential for serious  side effects to an unborn child. Talk to your health care professional or pharmacist for more information. Do not breast-feed an infant while taking this medicine. Check with your doctor or health care professional if you get an attack of severe diarrhea, nausea and vomiting, or if  you sweat a lot. The loss of too much body fluid can make it dangerous for you to take this medicine. What side effects may I notice from receiving this medicine? Side effects that you should report to your doctor or health care professional as soon as possible: -allergic reactions like skin rash, itching or hives, swelling of the face, lips, or tongue -breathing problems -changes in hearing -changes in vision -fast, irregular heartbeat -feeling faint or lightheaded, falls -pain, tingling, numbness in the hands or feet -right upper belly pain -seizures -swelling of the ankles, feet, hands -unusual bleeding or bruising -unusually weak or tired -vomiting -yellowing of the eyes or skin Side effects that usually do not require medical attention (report to your doctor or health care professional if they continue or are bothersome): -changes in emotions or moods -constipation -diarrhea -loss of appetite -headache -irritation at site where injected -nausea This list may not describe all possible side effects. Call your doctor for medical advice about side effects. You may report side effects to FDA at 1-800-FDA-1088. Where should I keep my medicine? This drug is given in a hospital or clinic and will not be stored at home. NOTE: This sheet is a summary. It may not cover all possible information. If you have questions about this medicine, talk to your doctor, pharmacist, or health care provider.    2016, Elsevier/Gold Standard. (2015-01-02 14:47:04)   Multiple Myeloma Multiple myeloma is a form of cancer that results from the uncontrolled growth of abnormal plasma cells. Plasma cells are a type of white blood cell produced in the soft tissue inside bones (bone marrow). These are cells in your blood that normally help you fight infection. They are part of your body's defense system (immune system). Plasma cells that become cancerous will grow out of control. As a result, they interfere with  normal blood cells and many important functions that normal cells perform in your body. With multiple myeloma, the abnormal plasma cells cause multiple tumors to form. Multiple myeloma damages your bones and causes other health problems because of its effect on blood cells. The disease progresses and reduces your ability to fight off infections. CAUSES The cause of multiple myeloma is not known. RISK FACTORS Risk factors include:  Being older than 62.  Being African American.  Having a family history of the disease. SIGNS AND SYMPTOMS Signs and symptoms of multiple myeloma may include:  Bone pain, especially in the back, ribs, and hips.  Broken bones (fractures).  Low blood counts, including reduced red blood cells (anemia), reduced white blood cells (leukopenia), and reduced platelets.  Fatigue.  Weakness.  Infections.  Bleeding, such as bleeding from the nose or gums, or increased bleeding from a scrape or cut.  High blood calcium levels.  Increased urination.  Confusion. DIAGNOSIS Your health care provider will do a physical exam and take your medical history. Tests will be done to help confirm the diagnosis. Tests may include:  Blood tests.  Urine tests.  X-rays.  MRI.  Bone marrow biopsy. In this test, a sample of marrow is removed from one of your bones. The sample is viewed under a microscope to check for abnormal plasma cells. TREATMENT There is no cure  for multiple myeloma. Treatment options may vary depending on how much the disease has advanced. Possible treatment options may include:  Medicines that kill cancer cells (chemotherapy).  Medicines that help prevent bone damage (bisphosphonates).  Radiation therapy. High-energy rays are used to kill cancer cells.  Surgery. This may be done to repair damage to bone.  Targeted drug therapy. These medicines block the growth and spread of cancer cells.  Immunotherapy. This is also called biologic therapy.  It involves the use of medicines to strengthen the ability of your immune system to fight cancer cells.  Stem cell transplant. Healthy stem cells are infused into your body. These stem cells produce new blood cells to replace those killed by the disease or by other treatments. The healthy cells that are transplanted may be your own or may come from another person.  Plasmapheresis. This is a procedure used to remove plasma cells from your blood.  Other medicines to treat problems such as infections or pain. HOME CARE INSTRUCTIONS  Take medicines only as directed by your health care provider.  Drink enough fluid to keep your urine clear or pale yellow.  Eat a well-balanced diet. Work with a dietitian to make sure you are getting the nutrition you need.  Take vitamins or dietary supplements as directed by your health care provider or dietitian.  Stay active. Talk to your health care provider about what types of exercises and activities are safe for you.  Avoid activities that cause increased pain.  Do not lift anything heavier than 10 lb (4.5 kg).  Consider joining a support group or seeking counseling to help you cope with the stress of having multiple myeloma.  Keep all follow-up visits as directed by your health care provider. This is important. SEEK MEDICAL CARE IF:  Your pain is not controlled with medicine or is getting worse.  You have a fever.  You have swollen legs.  You have weakness or dizziness.  You have unexplained weight loss.  You have unexplained bleeding or bruising.  You have a cough or cold symptoms.  You feel depressed.  You have changes in urination or bowel movements. SEEK IMMEDIATE MEDICAL CARE IF:  You have sudden severe pain, especially back pain.  You have numbness or weakness in your arms, hands, legs, or feet.  You have confusion.  You have weakness on one side of your body.  You have slurred speech.  You have trouble staying  awake.  You have shortness of breath.  You have blood in your stool or urine.  You vomit or cough up blood.   This information is not intended to replace advice given to you by your health care provider. Make sure you discuss any questions you have with your health care provider.   Document Released: 07/29/2001 Document Revised: 11/24/2014 Document Reviewed: 06/06/2014 Elsevier Interactive Patient Education [employer].

## 2016-02-27 LAB — KAPPA/LAMBDA LIGHT CHAINS
IG LAMBDA FREE LIGHT CHAIN: 3.19 mg/L — AB (ref 5.71–26.30)
Ig Kappa Free Light Chain: 162.95 mg/L — ABNORMAL HIGH (ref 3.30–19.40)
KAPPA/LAMBDA FLC RATIO: 51.08 — AB (ref 0.26–1.65)

## 2016-02-28 LAB — MULTIPLE MYELOMA PANEL, SERUM
ALBUMIN SERPL ELPH-MCNC: 3.5 g/dL (ref 2.9–4.4)
ALPHA 1: 0.2 g/dL (ref 0.0–0.4)
ALPHA2 GLOB SERPL ELPH-MCNC: 0.7 g/dL (ref 0.4–1.0)
Albumin/Glob SerPl: 1.3 (ref 0.7–1.7)
B-Globulin SerPl Elph-Mcnc: 1.5 g/dL — ABNORMAL HIGH (ref 0.7–1.3)
GAMMA GLOB SERPL ELPH-MCNC: 0.3 g/dL — AB (ref 0.4–1.8)
GLOBULIN, TOTAL: 2.7 g/dL (ref 2.2–3.9)
IGG (IMMUNOGLOBIN G), SERUM: 313 mg/dL — AB (ref 700–1600)
IgM, Qn, Serum: 6 mg/dL — ABNORMAL LOW (ref 26–217)
M PROTEIN SERPL ELPH-MCNC: 0.6 g/dL — AB
Total Protein: 6.2 g/dL (ref 6.0–8.5)

## 2016-02-29 ENCOUNTER — Ambulatory Visit (HOSPITAL_BASED_OUTPATIENT_CLINIC_OR_DEPARTMENT_OTHER): Payer: Commercial Managed Care - HMO

## 2016-02-29 ENCOUNTER — Encounter: Payer: Self-pay | Admitting: Pharmacist

## 2016-02-29 VITALS — BP 128/68 | HR 62 | Temp 97.2°F | Resp 18

## 2016-02-29 DIAGNOSIS — Z5112 Encounter for antineoplastic immunotherapy: Secondary | ICD-10-CM | POA: Diagnosis not present

## 2016-02-29 DIAGNOSIS — C9000 Multiple myeloma not having achieved remission: Secondary | ICD-10-CM | POA: Diagnosis not present

## 2016-02-29 MED ORDER — PROCHLORPERAZINE MALEATE 10 MG PO TABS
ORAL_TABLET | ORAL | Status: AC
  Filled 2016-02-29: qty 1

## 2016-02-29 MED ORDER — BORTEZOMIB CHEMO SQ INJECTION 3.5 MG (2.5MG/ML)
1.3000 mg/m2 | Freq: Once | INTRAMUSCULAR | Status: AC
  Administered 2016-02-29: 2.25 mg via SUBCUTANEOUS
  Filled 2016-02-29: qty 2.25

## 2016-02-29 MED ORDER — PROCHLORPERAZINE MALEATE 10 MG PO TABS
10.0000 mg | ORAL_TABLET | Freq: Once | ORAL | Status: AC
  Administered 2016-02-29: 10 mg via ORAL

## 2016-02-29 NOTE — Progress Notes (Signed)
Oral Chemotherapy Follow-Up Form  Original Start date of oral chemotherapy: 02-01-16   Called patient today to follow up regarding patient's oral chemotherapy medication: Revlimid  Spoke to patient's sister, Teressa Senter, regarding how Ms. Pins is doing on Revlimid -- states she is "doing good", no side effects, no nausea. Only reports some sinus issues lately. Ms. Dirkes continues to do light housework and is sleeping well.  Pt's sister reports 0 tablets/doses missed in the last month.   Pt reports the following side effects: none  Will follow up and call patient again in 2 weeks   Thank you,  Skip Mayer, PharmD, BCPS [facility]

## 2016-02-29 NOTE — Patient Instructions (Signed)
[facility] Discharge Instructions for Patients Receiving Chemotherapy  Today you received the following chemotherapy agents velcade   To help prevent nausea and vomiting after your treatment, we encourage you to take your nausea medication as directed  If you develop nausea and vomiting that is not controlled by your nausea medication, call the clinic.   BELOW ARE SYMPTOMS THAT SHOULD BE REPORTED IMMEDIATELY:  *FEVER GREATER THAN 100.5 F  *CHILLS WITH OR WITHOUT FEVER  NAUSEA AND VOMITING THAT IS NOT CONTROLLED WITH YOUR NAUSEA MEDICATION  *UNUSUAL SHORTNESS OF BREATH  *UNUSUAL BRUISING OR BLEEDING  TENDERNESS IN MOUTH AND THROAT WITH OR WITHOUT PRESENCE OF ULCERS  *URINARY PROBLEMS  *BOWEL PROBLEMS  UNUSUAL RASH Items with * indicate a potential emergency and should be followed up as soon as possible.  Feel free to call the clinic you have any questions or concerns. The clinic phone number is [phone].  

## 2016-03-04 ENCOUNTER — Ambulatory Visit: Payer: Commercial Managed Care - HMO

## 2016-03-05 ENCOUNTER — Other Ambulatory Visit: Payer: Self-pay | Admitting: *Deleted

## 2016-03-05 MED ORDER — LENALIDOMIDE 5 MG PO CAPS: ORAL_CAPSULE | ORAL | Status: DC

## 2016-03-07 ENCOUNTER — Ambulatory Visit: Payer: Commercial Managed Care - HMO

## 2016-03-11 ENCOUNTER — Telehealth: Payer: Self-pay | Admitting: Hematology and Oncology

## 2016-03-11 ENCOUNTER — Ambulatory Visit: Payer: Commercial Managed Care - HMO

## 2016-03-11 ENCOUNTER — Ambulatory Visit (HOSPITAL_BASED_OUTPATIENT_CLINIC_OR_DEPARTMENT_OTHER): Payer: Commercial Managed Care - HMO | Admitting: Hematology and Oncology

## 2016-03-11 ENCOUNTER — Other Ambulatory Visit (HOSPITAL_BASED_OUTPATIENT_CLINIC_OR_DEPARTMENT_OTHER): Payer: Commercial Managed Care - HMO

## 2016-03-11 ENCOUNTER — Encounter: Payer: Self-pay | Admitting: Hematology and Oncology

## 2016-03-11 ENCOUNTER — Ambulatory Visit (HOSPITAL_BASED_OUTPATIENT_CLINIC_OR_DEPARTMENT_OTHER): Payer: Commercial Managed Care - HMO

## 2016-03-11 VITALS — BP 131/69 | HR 65 | Temp 97.7°F | Resp 183 | Ht 63.0 in | Wt 141.4 lb

## 2016-03-11 DIAGNOSIS — Z5112 Encounter for antineoplastic immunotherapy: Secondary | ICD-10-CM

## 2016-03-11 DIAGNOSIS — E441 Mild protein-calorie malnutrition: Secondary | ICD-10-CM | POA: Diagnosis not present

## 2016-03-11 DIAGNOSIS — C9000 Multiple myeloma not having achieved remission: Secondary | ICD-10-CM | POA: Diagnosis not present

## 2016-03-11 DIAGNOSIS — G893 Neoplasm related pain (acute) (chronic): Secondary | ICD-10-CM

## 2016-03-11 DIAGNOSIS — D61818 Other pancytopenia: Secondary | ICD-10-CM | POA: Diagnosis not present

## 2016-03-11 LAB — CBC WITH DIFFERENTIAL/PLATELET
BASO%: 2.6 % — ABNORMAL HIGH (ref 0.0–2.0)
Basophils Absolute: 0.1 10*3/uL (ref 0.0–0.1)
EOS ABS: 0.1 10*3/uL (ref 0.0–0.5)
EOS%: 2.6 % (ref 0.0–7.0)
HCT: 31.9 % — ABNORMAL LOW (ref 34.8–46.6)
HGB: 10.3 g/dL — ABNORMAL LOW (ref 11.6–15.9)
LYMPH%: 39.2 % (ref 14.0–49.7)
MCH: 30.5 pg (ref 25.1–34.0)
MCHC: 32.3 g/dL (ref 31.5–36.0)
MCV: 94.6 fL (ref 79.5–101.0)
MONO#: 0.4 10*3/uL (ref 0.1–0.9)
MONO%: 12.4 % (ref 0.0–14.0)
NEUT%: 43.2 % (ref 38.4–76.8)
NEUTROS ABS: 1.4 10*3/uL — AB (ref 1.5–6.5)
PLATELETS: 269 10*3/uL (ref 145–400)
RBC: 3.37 10*6/uL — AB (ref 3.70–5.45)
RDW: 18.3 % — ABNORMAL HIGH (ref 11.2–14.5)
WBC: 3.4 10*3/uL — AB (ref 3.9–10.3)
lymph#: 1.3 10*3/uL (ref 0.9–3.3)

## 2016-03-11 LAB — COMPREHENSIVE METABOLIC PANEL
ALT: 13 U/L (ref 0–55)
ANION GAP: 8 meq/L (ref 3–11)
AST: 19 U/L (ref 5–34)
Albumin: 3.6 g/dL (ref 3.5–5.0)
Alkaline Phosphatase: 69 U/L (ref 40–150)
BILIRUBIN TOTAL: 0.73 mg/dL (ref 0.20–1.20)
BUN: 6.3 mg/dL — ABNORMAL LOW (ref 7.0–26.0)
CHLORIDE: 111 meq/L — AB (ref 98–109)
CO2: 23 meq/L (ref 22–29)
CREATININE: 0.7 mg/dL (ref 0.6–1.1)
Calcium: 8.7 mg/dL (ref 8.4–10.4)
EGFR: 82 mL/min/{1.73_m2} — AB (ref >90)
GLUCOSE: 104 mg/dL (ref 70–140)
POTASSIUM: 4.2 meq/L (ref 3.5–5.1)
SODIUM: 142 meq/L (ref 136–145)
TOTAL PROTEIN: 6.1 g/dL — AB (ref 6.4–8.3)

## 2016-03-11 MED ORDER — PROCHLORPERAZINE MALEATE 10 MG PO TABS
ORAL_TABLET | ORAL | Status: AC
  Filled 2016-03-11: qty 1

## 2016-03-11 MED ORDER — BORTEZOMIB CHEMO SQ INJECTION 3.5 MG (2.5MG/ML)
1.3000 mg/m2 | Freq: Once | INTRAMUSCULAR | Status: AC
  Administered 2016-03-11: 2.25 mg via SUBCUTANEOUS
  Filled 2016-03-11: qty 2.25

## 2016-03-11 MED ORDER — PROCHLORPERAZINE MALEATE 10 MG PO TABS
10.0000 mg | ORAL_TABLET | Freq: Once | ORAL | Status: AC
  Administered 2016-03-11: 10 mg via ORAL

## 2016-03-11 NOTE — Telephone Encounter (Signed)
per pof to sch pt appt-gave pt copy of avs °

## 2016-03-11 NOTE — Assessment & Plan Note (Signed)
Her pain is mildly improved since we begun treatment. She will continue to take the medication as needed for pain. She does not need radiation therapy for now.

## 2016-03-11 NOTE — Patient Instructions (Signed)
[facility] Discharge Instructions for Patients Receiving Chemotherapy  Today you received the following chemotherapy agents:  Velcade  To help prevent nausea and vomiting after your treatment, we encourage you to take your nausea medication as prescribed.   If you develop nausea and vomiting that is not controlled by your nausea medication, call the clinic.   BELOW ARE SYMPTOMS THAT SHOULD BE REPORTED IMMEDIATELY:  *FEVER GREATER THAN 100.5 F  *CHILLS WITH OR WITHOUT FEVER  NAUSEA AND VOMITING THAT IS NOT CONTROLLED WITH YOUR NAUSEA MEDICATION  *UNUSUAL SHORTNESS OF BREATH  *UNUSUAL BRUISING OR BLEEDING  TENDERNESS IN MOUTH AND THROAT WITH OR WITHOUT PRESENCE OF ULCERS  *URINARY PROBLEMS  *BOWEL PROBLEMS  UNUSUAL RASH Items with * indicate a potential emergency and should be followed up as soon as possible.  Feel free to call the clinic you have any questions or concerns. The clinic phone number is [phone].  Please show the CHEMO ALERT CARD at check-in to the Emergency Department and triage nurse.   

## 2016-03-11 NOTE — Progress Notes (Signed)
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 She is invited to cycle 3 of chemotherapy. She is doing well. Has very mild skin changes from Velcade. No nausea or vomiting. She is eating better and gaining weight. Her bone pain is stable and she only take pain medicine as needed She had mild constipation with pain medicine recently, resolved with laxative  REVIEW OF SYSTEMS:   Constitutional: Denies fevers, chills or abnormal weight loss Eyes: Denies blurriness of vision Ears, nose, mouth, throat, and face: Denies mucositis or sore throat Respiratory: Denies cough, dyspnea or wheezes Cardiovascular: Denies palpitation, chest discomfort or lower extremity swelling Lymphatics: Denies new lymphadenopathy or easy bruising Neurological:Denies numbness, tingling or new weaknesses Behavioral/Psych: Mood is stable, no new changes  All other systems were reviewed with the patient and are negative.  I have reviewed the past medical history, past surgical history, social history and family history with the patient and they are unchanged from previous note.  ALLERGIES:  is allergic to pravastatin and zocor.  MEDICATIONS:  Current Outpatient Prescriptions  Medication Sig Dispense Refill  . acyclovir (ZOVIRAX) 400 MG  tablet Take 1 tablet (400 mg total) by mouth daily. 60 tablet 3  . cyclobenzaprine (FLEXERIL) 5 MG tablet Take 1 tablet (5 mg total) by mouth 2 (two) times daily as needed for muscle spasms (muscle cramps). 60 tablet 0  . dexamethasone (DECADRON) 4 MG tablet Take 1 tablet (4 mg total) by mouth once a week. Take 5 tablets a week on Tuesdays 90 tablet 0  . lenalidomide (REVLIMID) 5 MG capsule Take 1 capsule daily for 14 days then rest 7 days 14 capsule 0  . lisinopril (PRINIVIL,ZESTRIL) 40 MG tablet Take 1 tablet (40 mg total) by mouth daily. 30 tablet 11  . ondansetron (ZOFRAN) 8 MG tablet Take 1 tablet (8 mg total) by mouth 2 (two) times daily as needed (Nausea or vomiting). 30 tablet 1  . oxyCODONE-acetaminophen (ROXICET) 5-325 MG tablet Take 1 tablet by mouth every 6 (six) hours as needed for severe pain. 90 tablet 0  . prochlorperazine (COMPAZINE) 10 MG tablet Take 1 tablet (10 mg total) by mouth every 6 (six) hours as needed (Nausea or vomiting). 30 tablet 1   No current facility-administered medications for this visit.    PHYSICAL EXAMINATION: ECOG PERFORMANCE STATUS: 0 - Asymptomatic  Filed Vitals:   03/11/16 0946  BP: 131/69  Pulse: 65  Temp: 97.7 F (36.5 C)  Resp: 183   Filed Weights   03/11/16 0946  Weight: 141 lb 6.4 oz (64.139 kg)    GENERAL:alert, no distress and comfortable SKIN: skin color, texture, turgor are normal, no rashes or significant lesions. She has very mild skin discoloration from Velcade EYES: normal, Conjunctiva are pink and non-injected, sclera clear OROPHARYNX:no exudate, no erythema and lips, buccal mucosa, and tongue normal  NECK: supple, thyroid  normal size, non-tender, without nodularity LYMPH:  no palpable lymphadenopathy in the cervical, axillary or inguinal LUNGS: clear to auscultation and percussion with normal breathing effort HEART: regular rate & rhythm and no murmurs and no lower extremity edema ABDOMEN:abdomen soft, non-tender and normal  bowel sounds Musculoskeletal:no cyanosis of digits and no clubbing  NEURO: alert & oriented x 3 with fluent speech, no focal motor/sensory deficits  LABORATORY DATA:  I have reviewed the data as listed    Component Value Date/Time   NA 142 03/11/2016 0927   NA 139 01/17/2016 0910   K 4.2 03/11/2016 0927   K 4.5 01/17/2016 0910   CL 102 01/17/2016 0910   CO2 23 03/11/2016 0927   CO2 26 01/17/2016 0910   GLUCOSE 104 03/11/2016 0927   GLUCOSE 103* 01/17/2016 0910   BUN 6.3* 03/11/2016 0927   BUN 20 01/17/2016 0910   CREATININE 0.7 03/11/2016 0927   CREATININE 0.87 01/17/2016 0910   CREATININE 1.03 08/16/2010 2050   CALCIUM 8.7 03/11/2016 0927   CALCIUM 9.3 01/17/2016 0910   PROT 6.1* 03/11/2016 0927   PROT 6.2 02/26/2016 0818   PROT 7.9 01/17/2016 0910   ALBUMIN 3.6 03/11/2016 0927   ALBUMIN 3.8 01/17/2016 0910   AST 19 03/11/2016 0927   AST 17 01/17/2016 0910   ALT 13 03/11/2016 0927   ALT 11 01/17/2016 0910   ALKPHOS 69 03/11/2016 0927   ALKPHOS 45 01/17/2016 0910   BILITOT 0.73 03/11/2016 0927   BILITOT 0.4 01/17/2016 0910   GFRNONAA 64 01/17/2016 0910   GFRNONAA 53* 08/16/2010 2050   GFRAA 74 01/17/2016 0910   GFRAA  08/16/2010 2050    >60        The eGFR has been calculated using the MDRD equation. This calculation has not been validated in all clinical situations. eGFR's persistently <60 mL/min signify possible Chronic Kidney Disease.    No results found for: SPEP, UPEP  Lab Results  Component Value Date   WBC 3.4* 03/11/2016   NEUTROABS 1.4* 03/11/2016   HGB 10.3* 03/11/2016   HCT 31.9* 03/11/2016   MCV 94.6 03/11/2016   PLT 269 03/11/2016      Chemistry      Component Value Date/Time   NA 142 03/11/2016 0927   NA 139 01/17/2016 0910   K 4.2 03/11/2016 0927   K 4.5 01/17/2016 0910   CL 102 01/17/2016 0910   CO2 23 03/11/2016 0927   CO2 26 01/17/2016 0910   BUN 6.3* 03/11/2016 0927   BUN 20 01/17/2016 0910   CREATININE 0.7 03/11/2016 0927    CREATININE 0.87 01/17/2016 0910   CREATININE 1.03 08/16/2010 2050      Component Value Date/Time   CALCIUM 8.7 03/11/2016 0927   CALCIUM 9.3 01/17/2016 0910   ALKPHOS 69 03/11/2016 0927   ALKPHOS 45 01/17/2016 0910   AST 19 03/11/2016 0927   AST 17 01/17/2016 0910   ALT 13 03/11/2016 0927   ALT 11 01/17/2016 0910   BILITOT 0.73 03/11/2016 0927   BILITOT 0.4 01/17/2016 0910      ASSESSMENT & PLAN:  Multiple myeloma without remission ([facility]) So far, she tolerated treatment well without major side effects. We will continue palliative induction chemotherapy with dexamethasone, Revlimid and Velcade along with every three-month Zometa. Recent blood work showed that she has achieved greater than partial remission. She will continue acyclovir daily for antimicrobial prophylaxis along with calcium, vitamin D supplement and aspirin    Acquired pancytopenia ([place]) This is related  to bone marrow disease. She is not symptomatic. Observe only. I will dose reduce the Revlimid because of this  Protein-calorie malnutrition, mild ([place]) Overall, with positive results to treatment, she is eating better and is gaining weight. I will start to taper dexamethasone next month.  Cancer associated pain Her pain is mildly improved since we begun treatment. She will continue to take the medication as needed for pain. She does not need radiation therapy for now.     Orders Placed This Encounter  Procedures  . Kappa/lambda light chains    Standing Status: Future     Number of Occurrences:      Standing Expiration Date: 04/15/2017  . Multiple Myeloma Panel (SPEP&IFE w/QIG)    Standing Status: Future     Number of Occurrences:      Standing Expiration Date: 04/15/2017   All questions were answered. The patient knows to call the clinic with any problems, questions or concerns. No barriers to learning was detected. I spent 15 minutes counseling the patient face to face. The total time spent in the  appointment was 20 minutes and more than 50% was on counseling and review of test results     [facility], [place], MD 03/11/2016 10:20 AM

## 2016-03-11 NOTE — Assessment & Plan Note (Signed)
Overall, with positive results to treatment, she is eating better and is gaining weight. I will start to taper dexamethasone next month.

## 2016-03-11 NOTE — Assessment & Plan Note (Signed)
This is related to bone marrow disease. She is not symptomatic. Observe only. I will dose reduce the Revlimid because of this

## 2016-03-11 NOTE — Assessment & Plan Note (Signed)
So far, she tolerated treatment well without major side effects. We will continue palliative induction chemotherapy with dexamethasone, Revlimid and Velcade along with every three-month Zometa. Recent blood work showed that she has achieved greater than partial remission. She will continue acyclovir daily for antimicrobial prophylaxis along with calcium, vitamin D supplement and aspirin

## 2016-03-14 ENCOUNTER — Ambulatory Visit (HOSPITAL_BASED_OUTPATIENT_CLINIC_OR_DEPARTMENT_OTHER): Payer: Commercial Managed Care - HMO

## 2016-03-14 VITALS — BP 103/55 | HR 55 | Temp 98.1°F | Resp 17

## 2016-03-14 DIAGNOSIS — C9000 Multiple myeloma not having achieved remission: Secondary | ICD-10-CM | POA: Diagnosis not present

## 2016-03-14 DIAGNOSIS — Z5112 Encounter for antineoplastic immunotherapy: Secondary | ICD-10-CM | POA: Diagnosis not present

## 2016-03-14 MED ORDER — BORTEZOMIB CHEMO SQ INJECTION 3.5 MG (2.5MG/ML)
1.3000 mg/m2 | Freq: Once | INTRAMUSCULAR | Status: AC
  Administered 2016-03-14: 2.25 mg via SUBCUTANEOUS
  Filled 2016-03-14: qty 2.25

## 2016-03-14 MED ORDER — PROCHLORPERAZINE MALEATE 10 MG PO TABS
ORAL_TABLET | ORAL | Status: AC
  Filled 2016-03-14: qty 1

## 2016-03-14 MED ORDER — PROCHLORPERAZINE MALEATE 10 MG PO TABS
10.0000 mg | ORAL_TABLET | Freq: Once | ORAL | Status: AC
  Administered 2016-03-14: 10 mg via ORAL

## 2016-03-14 NOTE — Progress Notes (Signed)
Patient had mild edema to both feet since yesterday - improved with elevation ; she denies any pain or numbness to them. MD notified via in basket as she is off today.  VS good.

## 2016-03-14 NOTE — Progress Notes (Signed)
OK to treat with ANC 1.4 from 03/11/16 per Dr. Lindi Adie (MD ON CALL) - pt given neutropenic teaching and family member verbalized understanding.

## 2016-03-14 NOTE — Patient Instructions (Signed)
[facility] Discharge Instructions for Patients Receiving Chemotherapy  Today you received the following chemotherapy agents Velcade. To help prevent nausea and vomiting after your treatment, we encourage you to take your nausea medication as directed.  If you develop nausea and vomiting that is not controlled by your nausea medication, call the clinic.   BELOW ARE SYMPTOMS THAT SHOULD BE REPORTED IMMEDIATELY:  *FEVER GREATER THAN 100.5 F  *CHILLS WITH OR WITHOUT FEVER  NAUSEA AND VOMITING THAT IS NOT CONTROLLED WITH YOUR NAUSEA MEDICATION  *UNUSUAL SHORTNESS OF BREATH  *UNUSUAL BRUISING OR BLEEDING  TENDERNESS IN MOUTH AND THROAT WITH OR WITHOUT PRESENCE OF ULCERS  *URINARY PROBLEMS  *BOWEL PROBLEMS  UNUSUAL RASH Items with * indicate a potential emergency and should be followed up as soon as possible.  Feel free to call the clinic you have any questions or concerns. The clinic phone number is [phone].  Please show the CHEMO ALERT CARD at check-in to the Emergency Department and triage nurse.    

## 2016-03-17 ENCOUNTER — Encounter: Payer: Self-pay | Admitting: Pharmacist

## 2016-03-17 DIAGNOSIS — C9000 Multiple myeloma not having achieved remission: Secondary | ICD-10-CM

## 2016-03-17 MED ORDER — ACYCLOVIR 400 MG PO TABS: 400.0000 mg | ORAL_TABLET | Freq: Every day | ORAL | Status: DC

## 2016-03-17 MED ORDER — DEXAMETHASONE 4 MG PO TABS: ORAL_TABLET | ORAL | Status: DC

## 2016-03-17 NOTE — Progress Notes (Signed)
Oral Chemotherapy Follow-Up Form  Original Start date of oral chemotherapy: 02-01-16  Called patient today to follow up regarding patient's oral chemotherapy medication: Revlimid  Spoke to patient's sister, Teressa Senter, regarding how Ms. Roblyer is doing on Revlimid. Pt is doing very well. No issues or side effects with the Revlimid. Some minor lack of appetite, but no nausea. Patient's sister states Ms. Addeo is eating.   Pt's sister reports 0 tablets/doses missed in the last month.   Pt reports the following side effects: none  Will follow up and call patient again in 4 weeks   Thank you,  Skip Mayer, PharmD, BCPS Oral Chemotherapy Clinic

## 2016-03-18 ENCOUNTER — Ambulatory Visit (HOSPITAL_BASED_OUTPATIENT_CLINIC_OR_DEPARTMENT_OTHER): Payer: Commercial Managed Care - HMO

## 2016-03-18 ENCOUNTER — Other Ambulatory Visit (HOSPITAL_BASED_OUTPATIENT_CLINIC_OR_DEPARTMENT_OTHER): Payer: Commercial Managed Care - HMO

## 2016-03-18 VITALS — BP 150/78 | HR 71 | Temp 97.7°F | Resp 17

## 2016-03-18 DIAGNOSIS — Z5112 Encounter for antineoplastic immunotherapy: Secondary | ICD-10-CM | POA: Diagnosis not present

## 2016-03-18 DIAGNOSIS — C9000 Multiple myeloma not having achieved remission: Secondary | ICD-10-CM | POA: Diagnosis not present

## 2016-03-18 LAB — COMPREHENSIVE METABOLIC PANEL
ALBUMIN: 3.6 g/dL (ref 3.5–5.0)
ALK PHOS: 59 U/L (ref 40–150)
ALT: 15 U/L (ref 0–55)
AST: 19 U/L (ref 5–34)
Anion Gap: 8 mEq/L (ref 3–11)
BILIRUBIN TOTAL: 0.54 mg/dL (ref 0.20–1.20)
BUN: 8.3 mg/dL (ref 7.0–26.0)
CALCIUM: 9 mg/dL (ref 8.4–10.4)
CHLORIDE: 110 meq/L — AB (ref 98–109)
CO2: 24 mEq/L (ref 22–29)
CREATININE: 0.7 mg/dL (ref 0.6–1.1)
EGFR: 84 mL/min/{1.73_m2} — ABNORMAL LOW (ref >90)
Glucose: 99 mg/dl (ref 70–140)
Potassium: 3.6 mEq/L (ref 3.5–5.1)
Sodium: 143 mEq/L (ref 136–145)
TOTAL PROTEIN: 5.9 g/dL — AB (ref 6.4–8.3)

## 2016-03-18 LAB — CBC WITH DIFFERENTIAL/PLATELET
BASO%: 1.4 % (ref 0.0–2.0)
Basophils Absolute: 0 10*3/uL (ref 0.0–0.1)
EOS%: 4.3 % (ref 0.0–7.0)
Eosinophils Absolute: 0.1 10*3/uL (ref 0.0–0.5)
HEMATOCRIT: 33.6 % — AB (ref 34.8–46.6)
HEMOGLOBIN: 10.7 g/dL — AB (ref 11.6–15.9)
LYMPH#: 1.3 10*3/uL (ref 0.9–3.3)
LYMPH%: 38.1 % (ref 14.0–49.7)
MCH: 29.8 pg (ref 25.1–34.0)
MCHC: 31.7 g/dL (ref 31.5–36.0)
MCV: 93.8 fL (ref 79.5–101.0)
MONO#: 0.2 10*3/uL (ref 0.1–0.9)
MONO%: 6.4 % (ref 0.0–14.0)
NEUT#: 1.7 10*3/uL (ref 1.5–6.5)
NEUT%: 49.8 % (ref 38.4–76.8)
PLATELETS: 151 10*3/uL (ref 145–400)
RBC: 3.58 10*6/uL — ABNORMAL LOW (ref 3.70–5.45)
RDW: 17.3 % — ABNORMAL HIGH (ref 11.2–14.5)
WBC: 3.3 10*3/uL — ABNORMAL LOW (ref 3.9–10.3)

## 2016-03-18 MED ORDER — PROCHLORPERAZINE MALEATE 10 MG PO TABS
10.0000 mg | ORAL_TABLET | Freq: Once | ORAL | Status: AC
  Administered 2016-03-18: 10 mg via ORAL

## 2016-03-18 MED ORDER — PROCHLORPERAZINE MALEATE 10 MG PO TABS
ORAL_TABLET | ORAL | Status: AC
  Filled 2016-03-18: qty 1

## 2016-03-18 MED ORDER — BORTEZOMIB CHEMO SQ INJECTION 3.5 MG (2.5MG/ML)
1.3000 mg/m2 | Freq: Once | INTRAMUSCULAR | Status: AC
  Administered 2016-03-18: 2.25 mg via SUBCUTANEOUS
  Filled 2016-03-18: qty 2.25

## 2016-03-18 NOTE — Patient Instructions (Signed)
[facility] Discharge Instructions for Patients Receiving Chemotherapy  Today you received the following chemotherapy agents:  Velcade  To help prevent nausea and vomiting after your treatment, we encourage you to take your nausea medication as prescrobed.   If you develop nausea and vomiting that is not controlled by your nausea medication, call the clinic.   BELOW ARE SYMPTOMS THAT SHOULD BE REPORTED IMMEDIATELY:  *FEVER GREATER THAN 100.5 F  *CHILLS WITH OR WITHOUT FEVER  NAUSEA AND VOMITING THAT IS NOT CONTROLLED WITH YOUR NAUSEA MEDICATION  *UNUSUAL SHORTNESS OF BREATH  *UNUSUAL BRUISING OR BLEEDING  TENDERNESS IN MOUTH AND THROAT WITH OR WITHOUT PRESENCE OF ULCERS  *URINARY PROBLEMS  *BOWEL PROBLEMS  UNUSUAL RASH Items with * indicate a potential emergency and should be followed up as soon as possible.  Feel free to call the clinic you have any questions or concerns. The clinic phone number is [phone].  Please show the [facility] at check-in to the Emergency Department and triage nurse.

## 2016-03-20 ENCOUNTER — Telehealth: Payer: Self-pay | Admitting: Family Medicine

## 2016-03-20 DIAGNOSIS — M48061 Spinal stenosis, lumbar region without neurogenic claudication: Secondary | ICD-10-CM

## 2016-03-20 MED ORDER — OXYCODONE-ACETAMINOPHEN 5-325 MG PO TABS: 1.0000 | ORAL_TABLET | Freq: Four times a day (QID) | ORAL | Status: DC | PRN

## 2016-03-20 NOTE — Telephone Encounter (Signed)
ok 

## 2016-03-20 NOTE — Telephone Encounter (Signed)
RX printed, left up front and patient aware to pick up in the am via vm 

## 2016-03-20 NOTE — Addendum Note (Signed)
Addended by: Shary Decamp B on: 03/20/2016 04:44 PM   Modules accepted: Orders

## 2016-03-20 NOTE — Telephone Encounter (Signed)
Needs rx for her percocet  [phone]

## 2016-03-20 NOTE — Telephone Encounter (Signed)
Ok to refill 

## 2016-03-21 ENCOUNTER — Ambulatory Visit (HOSPITAL_BASED_OUTPATIENT_CLINIC_OR_DEPARTMENT_OTHER): Payer: Commercial Managed Care - HMO

## 2016-03-21 VITALS — BP 149/87 | HR 70 | Temp 97.6°F | Resp 16

## 2016-03-21 DIAGNOSIS — Z5112 Encounter for antineoplastic immunotherapy: Secondary | ICD-10-CM | POA: Diagnosis not present

## 2016-03-21 DIAGNOSIS — C9000 Multiple myeloma not having achieved remission: Secondary | ICD-10-CM | POA: Diagnosis not present

## 2016-03-21 MED ORDER — PROCHLORPERAZINE MALEATE 10 MG PO TABS
10.0000 mg | ORAL_TABLET | Freq: Once | ORAL | Status: AC
  Administered 2016-03-21: 10 mg via ORAL

## 2016-03-21 MED ORDER — PROCHLORPERAZINE MALEATE 10 MG PO TABS
ORAL_TABLET | ORAL | Status: AC
  Filled 2016-03-21: qty 1

## 2016-03-21 MED ORDER — BORTEZOMIB CHEMO SQ INJECTION 3.5 MG (2.5MG/ML)
1.3000 mg/m2 | Freq: Once | INTRAMUSCULAR | Status: AC
  Administered 2016-03-21: 2.25 mg via SUBCUTANEOUS
  Filled 2016-03-21: qty 2.25

## 2016-03-21 NOTE — Progress Notes (Signed)
Patient c/o feet and ankles swelling, per dr Alvy Bimler she is to keep lower extremities elevated and decrease salt intake. Patient and sister verbalized understanding.

## 2016-03-21 NOTE — Patient Instructions (Signed)
Bortezomib injection What is this medicine? BORTEZOMIB (bor TEZ oh mib) is a medicine that targets proteins in cancer cells and stops the cancer cells from growing. It is used to treat multiple myeloma and mantle-cell lymphoma. This medicine may be used for other purposes; ask your health care provider or pharmacist if you have questions. What should I tell my health care provider before I take this medicine? They need to know if you have any of these conditions: -diabetes -heart disease -irregular heartbeat -liver disease -on hemodialysis -low blood counts, like low white blood cells, platelets, or hemoglobin -peripheral neuropathy -taking medicine for blood pressure -an unusual or allergic reaction to bortezomib, mannitol, boron, other medicines, foods, dyes, or preservatives -pregnant or trying to get pregnant -breast-feeding How should I use this medicine? This medicine is for injection into a vein or for injection under the skin. It is given by a health care professional in a hospital or clinic setting. Talk to your pediatrician regarding the use of this medicine in children. Special care may be needed. Overdosage: If you think you have taken too much of this medicine contact a poison control center or emergency room at once. NOTE: This medicine is only for you. Do not share this medicine with others. What if I miss a dose? It is important not to miss your dose. Call your doctor or health care professional if you are unable to keep an appointment. What may interact with this medicine? This medicine may interact with the following medications: -ketoconazole -rifampin -ritonavir -St. John's Wort This list may not describe all possible interactions. Give your health care provider a list of all the medicines, herbs, non-prescription drugs, or dietary supplements you use. Also tell them if you smoke, drink alcohol, or use illegal drugs. Some items may interact with your medicine. What  should I watch for while using this medicine? Visit your doctor for checks on your progress. This drug may make you feel generally unwell. This is not uncommon, as chemotherapy can affect healthy cells as well as cancer cells. Report any side effects. Continue your course of treatment even though you feel ill unless your doctor tells you to stop. You may get drowsy or dizzy. Do not drive, use machinery, or do anything that needs mental alertness until you know how this medicine affects you. Do not stand or sit up quickly, especially if you are an older patient. This reduces the risk of dizzy or fainting spells. In some cases, you may be given additional medicines to help with side effects. Follow all directions for their use. Call your doctor or health care professional for advice if you get a fever, chills or sore throat, or other symptoms of a cold or flu. Do not treat yourself. This drug decreases your body's ability to fight infections. Try to avoid being around people who are sick. This medicine may increase your risk to bruise or bleed. Call your doctor or health care professional if you notice any unusual bleeding. You may need blood work done while you are taking this medicine. In some patients, this medicine may cause a serious brain infection that may cause death. If you have any problems seeing, thinking, speaking, walking, or standing, tell your doctor right away. If you cannot reach your doctor, urgently seek other source of medical care. Do not become pregnant while taking this medicine. Women should inform their doctor if they wish to become pregnant or think they might be pregnant. There is a potential for serious  side effects to an unborn child. Talk to your health care professional or pharmacist for more information. Do not breast-feed an infant while taking this medicine. Check with your doctor or health care professional if you get an attack of severe diarrhea, nausea and vomiting, or if  you sweat a lot. The loss of too much body fluid can make it dangerous for you to take this medicine. What side effects may I notice from receiving this medicine? Side effects that you should report to your doctor or health care professional as soon as possible: -allergic reactions like skin rash, itching or hives, swelling of the face, lips, or tongue -breathing problems -changes in hearing -changes in vision -fast, irregular heartbeat -feeling faint or lightheaded, falls -pain, tingling, numbness in the hands or feet -right upper belly pain -seizures -swelling of the ankles, feet, hands -unusual bleeding or bruising -unusually weak or tired -vomiting -yellowing of the eyes or skin Side effects that usually do not require medical attention (report to your doctor or health care professional if they continue or are bothersome): -changes in emotions or moods -constipation -diarrhea -loss of appetite -headache -irritation at site where injected -nausea This list may not describe all possible side effects. Call your doctor for medical advice about side effects. You may report side effects to FDA at 1-800-FDA-1088. Where should I keep my medicine? This drug is given in a hospital or clinic and will not be stored at home. NOTE: This sheet is a summary. It may not cover all possible information. If you have questions about this medicine, talk to your doctor, pharmacist, or health care provider.    2016, Elsevier/Gold Standard. (2015-01-02 14:47:04)

## 2016-03-25 ENCOUNTER — Ambulatory Visit: Payer: Commercial Managed Care - HMO

## 2016-03-25 ENCOUNTER — Other Ambulatory Visit: Payer: Self-pay | Admitting: *Deleted

## 2016-03-25 MED ORDER — LENALIDOMIDE 5 MG PO CAPS: ORAL_CAPSULE | ORAL | Status: DC

## 2016-03-27 MED FILL — ACYCLOVIR 400 MG TABLET: 400 | 60 days supply | Qty: 60 | Fill #1

## 2016-03-28 ENCOUNTER — Ambulatory Visit: Payer: Commercial Managed Care - HMO

## 2016-04-01 ENCOUNTER — Telehealth: Payer: Self-pay | Admitting: *Deleted

## 2016-04-01 ENCOUNTER — Other Ambulatory Visit (HOSPITAL_BASED_OUTPATIENT_CLINIC_OR_DEPARTMENT_OTHER): Payer: Commercial Managed Care - HMO

## 2016-04-01 ENCOUNTER — Ambulatory Visit: Payer: Commercial Managed Care - HMO

## 2016-04-01 DIAGNOSIS — C9000 Multiple myeloma not having achieved remission: Secondary | ICD-10-CM

## 2016-04-01 LAB — COMPREHENSIVE METABOLIC PANEL
ALBUMIN: 3.7 g/dL (ref 3.5–5.0)
ALK PHOS: 56 U/L (ref 40–150)
ALT: 15 U/L (ref 0–55)
AST: 20 U/L (ref 5–34)
Anion Gap: 8 mEq/L (ref 3–11)
BILIRUBIN TOTAL: 0.54 mg/dL (ref 0.20–1.20)
BUN: 9.7 mg/dL (ref 7.0–26.0)
CALCIUM: 8.6 mg/dL (ref 8.4–10.4)
CO2: 23 mEq/L (ref 22–29)
Chloride: 111 mEq/L — ABNORMAL HIGH (ref 98–109)
Creatinine: 0.7 mg/dL (ref 0.6–1.1)
EGFR: 84 mL/min/{1.73_m2} — ABNORMAL LOW (ref >90)
GLUCOSE: 90 mg/dL (ref 70–140)
Potassium: 3.9 mEq/L (ref 3.5–5.1)
SODIUM: 141 meq/L (ref 136–145)
TOTAL PROTEIN: 6.1 g/dL — AB (ref 6.4–8.3)

## 2016-04-01 LAB — CBC WITH DIFFERENTIAL/PLATELET
BASO%: 3.4 % — ABNORMAL HIGH (ref 0.0–2.0)
Basophils Absolute: 0.2 10*3/uL — ABNORMAL HIGH (ref 0.0–0.1)
EOS ABS: 0.2 10*3/uL (ref 0.0–0.5)
EOS%: 5 % (ref 0.0–7.0)
HEMATOCRIT: 34.5 % — AB (ref 34.8–46.6)
HEMOGLOBIN: 11 g/dL — AB (ref 11.6–15.9)
LYMPH#: 2.7 10*3/uL (ref 0.9–3.3)
LYMPH%: 61 % — ABNORMAL HIGH (ref 14.0–49.7)
MCH: 30 pg (ref 25.1–34.0)
MCHC: 31.9 g/dL (ref 31.5–36.0)
MCV: 94 fL (ref 79.5–101.0)
MONO#: 0.7 10*3/uL (ref 0.1–0.9)
MONO%: 15.6 % — AB (ref 0.0–14.0)
NEUT%: 15 % — ABNORMAL LOW (ref 38.4–76.8)
NEUTROS ABS: 0.7 10*3/uL — AB (ref 1.5–6.5)
Platelets: 290 10*3/uL (ref 145–400)
RBC: 3.67 10*6/uL — ABNORMAL LOW (ref 3.70–5.45)
RDW: 17.3 % — AB (ref 11.2–14.5)
WBC: 4.4 10*3/uL (ref 3.9–10.3)

## 2016-04-01 NOTE — Progress Notes (Signed)
Pt's ANC 0.7.  Velcade held today and Friday and pt instructed to stop revlimid until she sees Dr Alvy Bimler on Tuesday May 23rd.  Pt voiced understanding.  Pt's schedule changed to see Dr Alvy Bimler before treatment 04/08/16.  Schedule printed for pt. Pt will call back with any questions or concerns.

## 2016-04-01 NOTE — Telephone Encounter (Signed)
Per chemo RN canceled appts for this week. Also moved MD visit on 5/23 to before treatment per chemo RN. Chemo RN to give patient new calendar

## 2016-04-02 LAB — KAPPA/LAMBDA LIGHT CHAINS
Ig Kappa Free Light Chain: 122.43 mg/L — ABNORMAL HIGH (ref 3.30–19.40)
Ig Lambda Free Light Chain: 8.15 mg/L (ref 5.71–26.30)
Kappa/Lambda FluidC Ratio: 15.02 — ABNORMAL HIGH (ref 0.26–1.65)

## 2016-04-03 LAB — MULTIPLE MYELOMA PANEL, SERUM
ALBUMIN SERPL ELPH-MCNC: 3.3 g/dL (ref 2.9–4.4)
ALBUMIN/GLOB SERPL: 1.6 (ref 0.7–1.7)
ALPHA 1: 0.3 g/dL (ref 0.0–0.4)
ALPHA2 GLOB SERPL ELPH-MCNC: 0.6 g/dL (ref 0.4–1.0)
B-Globulin SerPl Elph-Mcnc: 1 g/dL (ref 0.7–1.3)
GLOBULIN, TOTAL: 2.2 g/dL (ref 2.2–3.9)
Gamma Glob SerPl Elph-Mcnc: 0.3 g/dL — ABNORMAL LOW (ref 0.4–1.8)
IGG (IMMUNOGLOBIN G), SERUM: 345 mg/dL — AB (ref 700–1600)
IGM (IMMUNOGLOBIN M), SRM: 13 mg/dL — AB (ref 26–217)
IgA, Qn, Serum: 221 mg/dL (ref 64–422)
Total Protein: 5.5 g/dL — ABNORMAL LOW (ref 6.0–8.5)

## 2016-04-04 ENCOUNTER — Ambulatory Visit: Payer: Commercial Managed Care - HMO

## 2016-04-08 ENCOUNTER — Encounter: Payer: Self-pay | Admitting: Hematology and Oncology

## 2016-04-08 ENCOUNTER — Ambulatory Visit: Payer: Commercial Managed Care - HMO

## 2016-04-08 ENCOUNTER — Telehealth: Payer: Self-pay | Admitting: Hematology and Oncology

## 2016-04-08 ENCOUNTER — Ambulatory Visit (HOSPITAL_BASED_OUTPATIENT_CLINIC_OR_DEPARTMENT_OTHER): Payer: Commercial Managed Care - HMO | Admitting: Hematology and Oncology

## 2016-04-08 ENCOUNTER — Other Ambulatory Visit (HOSPITAL_BASED_OUTPATIENT_CLINIC_OR_DEPARTMENT_OTHER): Payer: Commercial Managed Care - HMO

## 2016-04-08 VITALS — BP 136/76 | HR 66 | Temp 97.8°F | Resp 18 | Ht 63.0 in | Wt 137.9 lb

## 2016-04-08 DIAGNOSIS — C9000 Multiple myeloma not having achieved remission: Secondary | ICD-10-CM

## 2016-04-08 DIAGNOSIS — D702 Other drug-induced agranulocytosis: Secondary | ICD-10-CM | POA: Diagnosis not present

## 2016-04-08 DIAGNOSIS — R413 Other amnesia: Secondary | ICD-10-CM | POA: Diagnosis not present

## 2016-04-08 DIAGNOSIS — R600 Localized edema: Secondary | ICD-10-CM | POA: Diagnosis not present

## 2016-04-08 DIAGNOSIS — C9001 Multiple myeloma in remission: Secondary | ICD-10-CM

## 2016-04-08 LAB — COMPREHENSIVE METABOLIC PANEL
ALT: 13 U/L (ref 0–55)
ANION GAP: 8 meq/L (ref 3–11)
AST: 16 U/L (ref 5–34)
Albumin: 3.6 g/dL (ref 3.5–5.0)
Alkaline Phosphatase: 47 U/L (ref 40–150)
BILIRUBIN TOTAL: 0.58 mg/dL (ref 0.20–1.20)
BUN: 11.3 mg/dL (ref 7.0–26.0)
CALCIUM: 8.4 mg/dL (ref 8.4–10.4)
CHLORIDE: 112 meq/L — AB (ref 98–109)
CO2: 22 mEq/L (ref 22–29)
CREATININE: 0.7 mg/dL (ref 0.6–1.1)
EGFR: 83 mL/min/{1.73_m2} — ABNORMAL LOW (ref >90)
Glucose: 100 mg/dl (ref 70–140)
Potassium: 3.9 mEq/L (ref 3.5–5.1)
Sodium: 142 mEq/L (ref 136–145)
Total Protein: 5.9 g/dL — ABNORMAL LOW (ref 6.4–8.3)

## 2016-04-08 LAB — CBC WITH DIFFERENTIAL/PLATELET
BASO%: 2.6 % — AB (ref 0.0–2.0)
BASOS ABS: 0.1 10*3/uL (ref 0.0–0.1)
EOS ABS: 0.2 10*3/uL (ref 0.0–0.5)
EOS%: 5.8 % (ref 0.0–7.0)
HEMATOCRIT: 36 % (ref 34.8–46.6)
HGB: 11.5 g/dL — ABNORMAL LOW (ref 11.6–15.9)
LYMPH#: 1.9 10*3/uL (ref 0.9–3.3)
LYMPH%: 55.3 % — ABNORMAL HIGH (ref 14.0–49.7)
MCH: 29.9 pg (ref 25.1–34.0)
MCHC: 31.9 g/dL (ref 31.5–36.0)
MCV: 93.5 fL (ref 79.5–101.0)
MONO#: 0.5 10*3/uL (ref 0.1–0.9)
MONO%: 13.5 % (ref 0.0–14.0)
NEUT#: 0.8 10*3/uL — ABNORMAL LOW (ref 1.5–6.5)
NEUT%: 22.8 % — AB (ref 38.4–76.8)
PLATELETS: 246 10*3/uL (ref 145–400)
RBC: 3.85 10*6/uL (ref 3.70–5.45)
RDW: 16.5 % — ABNORMAL HIGH (ref 11.2–14.5)
WBC: 3.4 10*3/uL — ABNORMAL LOW (ref 3.9–10.3)

## 2016-04-08 MED ORDER — DEXAMETHASONE 4 MG PO TABS: 4.0000 mg | ORAL_TABLET | ORAL | Status: DC

## 2016-04-08 MED FILL — DEXAMETHASONE 4 MG TABLET: 4 | 70 days supply | Qty: 10 | Fill #0

## 2016-04-08 NOTE — Assessment & Plan Note (Signed)
She has very poor memory according to her sister. I also noticed that Her sister is the dedicated MPOA. We discussed prognosis and future treatment direction.

## 2016-04-08 NOTE — Telephone Encounter (Signed)
Changed time of to lab/fu/tx 6/6 to 9:15 am due to lab meeting. Spoke with patient she is aware. Mailed new schedule.

## 2016-04-08 NOTE — Telephone Encounter (Signed)
gavae pt apt & avs

## 2016-04-08 NOTE — Assessment & Plan Note (Signed)
This is likely due to recent treatment. The patient denies recent history of fevers, cough, chills, diarrhea or dysuria. She is asymptomatic from the leukopenia. I will observe for now.   I would give her a treatment break as above. Right now, she does not require prophylactic growth factor injection

## 2016-04-08 NOTE — Assessment & Plan Note (Signed)
I reviewed recent blood work with the patient and family members. She has a remarkable response with near complete remission after only 3 cycles of treatment. Due to neutropenia, I will continue to hold therapy. I will give her several weeks of break and see her back on 04/22/2016 for further review. I would like to complete cycle 4 of treatment and then transition her to maintenance therapy. In the meantime, I will continue dexamethasone taper. She will continue aspirin therapy for DVT prophylaxis along with acyclovir for antimicrobial prophylaxis She will take calcium with vitamin D supplement. Her next Zometa dose will be due in June 2017.

## 2016-04-08 NOTE — Progress Notes (Signed)
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 She returns for further follow-up. She denies recent side effects of treatment. No recent infection. Denies pain. She complained of bilateral lower extremity edema. The patient denies any recent signs or symptoms of bleeding such as spontaneous epistaxis, hematuria or hematochezia.   REVIEW OF SYSTEMS:   Constitutional: Denies fevers, chills or abnormal weight loss Eyes: Denies blurriness of vision Ears, nose, mouth, throat, and face: Denies mucositis or sore throat Respiratory: Denies cough, dyspnea or wheezes Cardiovascular: Denies palpitation, chest discomfort Gastrointestinal:  Denies nausea, heartburn or change in bowel habits Skin: Denies abnormal skin rashes Lymphatics: Denies new lymphadenopathy or easy bruising Neurological:Denies numbness, tingling or new weaknesses Behavioral/Psych: Mood is stable, no new changes  All other systems were reviewed with the patient and are negative.  I have reviewed the past medical history, past surgical history, social history and family history with the patient and they are unchanged from previous note.  ALLERGIES:  is allergic to pravastatin  and zocor.  MEDICATIONS:  Current Outpatient Prescriptions  Medication Sig Dispense Refill  . acyclovir (ZOVIRAX) 400 MG tablet Take 1 tablet (400 mg total) by mouth daily. 60 tablet 3  . cyclobenzaprine (FLEXERIL) 5 MG tablet Take 1 tablet (5 mg total) by mouth 2 (two) times daily as needed for muscle spasms (muscle cramps). 60 tablet 0  . dexamethasone (DECADRON) 4 MG tablet Take 16 mg by mouth once a week. Tuesdays    . lenalidomide (REVLIMID) 5 MG capsule Take 1 capsule daily for 14 days then rest 7 days 14 capsule 0  . lisinopril (PRINIVIL,ZESTRIL) 40 MG tablet Take 1 tablet (40 mg total) by mouth daily. 30 tablet 11  . ondansetron (ZOFRAN) 8 MG tablet Take 1 tablet (8 mg total) by mouth 2 (two) times daily as needed (Nausea or vomiting). 30 tablet 1  . oxyCODONE-acetaminophen (ROXICET) 5-325 MG tablet Take 1 tablet by mouth every 6 (six) hours as needed for severe pain. 90 tablet 0  . prochlorperazine (COMPAZINE) 10 MG tablet Take 1 tablet (10 mg total) by mouth every 6 (six) hours as needed (Nausea or vomiting). 30 tablet 1  . dexamethasone (DECADRON) 4 MG tablet Take 1 tablet (4 mg total) by mouth once a week. 10 tablet 1   No current facility-administered medications for this visit.    PHYSICAL EXAMINATION: ECOG PERFORMANCE STATUS: 0 - Asymptomatic  Filed Vitals:   04/08/16 0834  BP: 136/76  Pulse: 66  Temp: 97.8 F (36.6 C)  Resp: 18   Filed Weights   04/08/16 0834  Weight: 137 lb 14.4 oz (62.551 kg)    GENERAL:alert, no distress and comfortable SKIN: skin color, texture, turgor are normal, no rashes or significant lesions EYES: normal, Conjunctiva are pink and non-injected, sclera clear  OROPHARYNX:no exudate, no erythema and lips, buccal mucosa, and tongue normal  NECK: supple, thyroid normal size, non-tender, without nodularity LYMPH:  no palpable lymphadenopathy in the cervical, axillary or inguinal LUNGS: clear to auscultation and percussion with normal breathing  effort HEART: regular rate & rhythm and no murmurs with mild bilateral lower extremity edema ABDOMEN:abdomen soft, non-tender and normal bowel sounds Musculoskeletal:no cyanosis of digits and no clubbing  NEURO: alert & oriented x 3 with fluent speech, no focal motor/sensory deficits  LABORATORY DATA:  I have reviewed the data as listed    Component Value Date/Time   NA 142 04/08/2016 0819   NA 139 01/17/2016 0910   K 3.9 04/08/2016 0819   K 4.5 01/17/2016 0910   CL 102 01/17/2016 0910   CO2 22 04/08/2016 0819   CO2 26 01/17/2016 0910   GLUCOSE 100 04/08/2016 0819   GLUCOSE 103* 01/17/2016 0910   BUN 11.3 04/08/2016 0819   BUN 20 01/17/2016 0910   CREATININE 0.7 04/08/2016 0819   CREATININE 0.87 01/17/2016 0910   CREATININE 1.03 08/16/2010 2050   CALCIUM 8.4 04/08/2016 0819   CALCIUM 9.3 01/17/2016 0910   PROT 5.9* 04/08/2016 0819   PROT 5.5* 04/01/2016 0824   PROT 7.9 01/17/2016 0910   ALBUMIN 3.6 04/08/2016 0819   ALBUMIN 3.8 01/17/2016 0910   AST 16 04/08/2016 0819   AST 17 01/17/2016 0910   ALT 13 04/08/2016 0819   ALT 11 01/17/2016 0910   ALKPHOS 47 04/08/2016 0819   ALKPHOS 45 01/17/2016 0910   BILITOT 0.58 04/08/2016 0819   BILITOT 0.4 01/17/2016 0910   GFRNONAA 64 01/17/2016 0910   GFRNONAA 53* 08/16/2010 2050   GFRAA 74 01/17/2016 0910   GFRAA  08/16/2010 2050    >60        The eGFR has been calculated using the MDRD equation. This calculation has not been validated in all clinical situations. eGFR's persistently <60 mL/min signify possible Chronic Kidney Disease.    No results found for: SPEP, UPEP  Lab Results  Component Value Date   WBC 3.4* 04/08/2016   NEUTROABS 0.8* 04/08/2016   HGB 11.5* 04/08/2016   HCT 36.0 04/08/2016   MCV 93.5 04/08/2016   PLT 246 04/08/2016      Chemistry      Component Value Date/Time   NA 142 04/08/2016 0819   NA 139 01/17/2016 0910   K 3.9 04/08/2016 0819   K 4.5 01/17/2016 0910   CL 102 01/17/2016 0910    CO2 22 04/08/2016 0819   CO2 26 01/17/2016 0910   BUN 11.3 04/08/2016 0819   BUN 20 01/17/2016 0910   CREATININE 0.7 04/08/2016 0819   CREATININE 0.87 01/17/2016 0910   CREATININE 1.03 08/16/2010 2050      Component Value Date/Time   CALCIUM 8.4 04/08/2016 0819   CALCIUM 9.3 01/17/2016 0910   ALKPHOS 47 04/08/2016 0819   ALKPHOS 45 01/17/2016 0910   AST 16 04/08/2016 0819   AST 17 01/17/2016 0910   ALT 13 04/08/2016 0819   ALT 11 01/17/2016 0910   BILITOT 0.58 04/08/2016 0819   BILITOT 0.4 01/17/2016 0910      ASSESSMENT & PLAN:  Multiple myeloma in remission ([place]) I reviewed recent blood work with the patient and family members. She has a remarkable response with near complete remission after only 3 cycles of treatment. Due to neutropenia, I will continue to hold therapy. I will give her several weeks of break and see her back  on 04/22/2016 for further review. I would like to complete cycle 4 of treatment and then transition her to maintenance therapy. In the meantime, I will continue dexamethasone taper. She will continue aspirin therapy for DVT prophylaxis along with acyclovir for antimicrobial prophylaxis She will take calcium with vitamin D supplement. Her next Zometa dose will be due in June 2017.  Bilateral leg edema This is due to mild fluid retention from dexamethasone and low protein status. Encourage increase mobility and would like to avoid diuretic therapy if all possible. I would initiate dexamethasone taper as above  Drug-induced neutropenia ([place]) This is likely due to recent treatment. The patient denies recent history of fevers, cough, chills, diarrhea or dysuria. She is asymptomatic from the leukopenia. I will observe for now.   I would give her a treatment break as above. Right now, she does not require prophylactic growth factor injection  Memory deficits She has very poor memory according to her sister. I also noticed that Her sister is the  dedicated MPOA. We discussed prognosis and future treatment direction.   No orders of the defined types were placed in this encounter.   All questions were answered. The patient knows to call the clinic with any problems, questions or concerns. No barriers to learning was detected. I spent 25 minutes counseling the patient face to face. The total time spent in the appointment was 30 minutes and more than 50% was on counseling and review of test results     [facility], Kimberly Downum, MD 04/08/2016 10:59 AM

## 2016-04-08 NOTE — Assessment & Plan Note (Signed)
This is due to mild fluid retention from dexamethasone and low protein status. Encourage increase mobility and would like to avoid diuretic therapy if all possible. I would initiate dexamethasone taper as above

## 2016-04-11 ENCOUNTER — Ambulatory Visit: Payer: Commercial Managed Care - HMO

## 2016-04-15 ENCOUNTER — Ambulatory Visit: Payer: Commercial Managed Care - HMO

## 2016-04-18 ENCOUNTER — Ambulatory Visit: Payer: Commercial Managed Care - HMO

## 2016-04-21 ENCOUNTER — Telehealth: Payer: Self-pay | Admitting: Family Medicine

## 2016-04-21 NOTE — Telephone Encounter (Signed)
Received fax from [place] back Auth# [id number] Treating Provider Dr. Biagio Borg # of Visits 6 Start Date 04/21/16 End Date 10/18/16 CPT 99499 DX C90.01

## 2016-04-22 ENCOUNTER — Telehealth: Payer: Self-pay | Admitting: Hematology and Oncology

## 2016-04-22 ENCOUNTER — Encounter: Payer: Self-pay | Admitting: Hematology and Oncology

## 2016-04-22 ENCOUNTER — Other Ambulatory Visit (HOSPITAL_BASED_OUTPATIENT_CLINIC_OR_DEPARTMENT_OTHER): Payer: Commercial Managed Care - HMO

## 2016-04-22 ENCOUNTER — Ambulatory Visit (HOSPITAL_BASED_OUTPATIENT_CLINIC_OR_DEPARTMENT_OTHER): Payer: Commercial Managed Care - HMO | Admitting: Hematology and Oncology

## 2016-04-22 ENCOUNTER — Ambulatory Visit: Payer: Commercial Managed Care - HMO

## 2016-04-22 VITALS — BP 166/87 | HR 62 | Temp 97.6°F | Resp 17 | Ht 63.0 in | Wt 132.3 lb

## 2016-04-22 DIAGNOSIS — R413 Other amnesia: Secondary | ICD-10-CM

## 2016-04-22 DIAGNOSIS — C9001 Multiple myeloma in remission: Secondary | ICD-10-CM

## 2016-04-22 DIAGNOSIS — D702 Other drug-induced agranulocytosis: Secondary | ICD-10-CM | POA: Diagnosis not present

## 2016-04-22 DIAGNOSIS — C9000 Multiple myeloma not having achieved remission: Secondary | ICD-10-CM

## 2016-04-22 LAB — COMPREHENSIVE METABOLIC PANEL
ALBUMIN: 3.8 g/dL (ref 3.5–5.0)
ALT: 12 U/L (ref 0–55)
ANION GAP: 9 meq/L (ref 3–11)
AST: 18 U/L (ref 5–34)
Alkaline Phosphatase: 50 U/L (ref 40–150)
BUN: 7.3 mg/dL (ref 7.0–26.0)
CALCIUM: 9.2 mg/dL (ref 8.4–10.4)
CO2: 25 mEq/L (ref 22–29)
Chloride: 109 mEq/L (ref 98–109)
Creatinine: 0.7 mg/dL (ref 0.6–1.1)
EGFR: 78 mL/min/{1.73_m2} — AB (ref >90)
Glucose: 97 mg/dl (ref 70–140)
Potassium: 4.3 mEq/L (ref 3.5–5.1)
Sodium: 143 mEq/L (ref 136–145)
TOTAL PROTEIN: 6.3 g/dL — AB (ref 6.4–8.3)
Total Bilirubin: 0.48 mg/dL (ref 0.20–1.20)

## 2016-04-22 LAB — CBC WITH DIFFERENTIAL/PLATELET
BASO%: 3 % — AB (ref 0.0–2.0)
Basophils Absolute: 0.1 10*3/uL (ref 0.0–0.1)
EOS%: 2.4 % (ref 0.0–7.0)
Eosinophils Absolute: 0.1 10*3/uL (ref 0.0–0.5)
HCT: 39.2 % (ref 34.8–46.6)
HGB: 12.8 g/dL (ref 11.6–15.9)
LYMPH%: 58.3 % — AB (ref 14.0–49.7)
MCH: 30 pg (ref 25.1–34.0)
MCHC: 32.7 g/dL (ref 31.5–36.0)
MCV: 92 fL (ref 79.5–101.0)
MONO#: 0.4 10*3/uL (ref 0.1–0.9)
MONO%: 13.2 % (ref 0.0–14.0)
NEUT#: 0.8 10*3/uL — ABNORMAL LOW (ref 1.5–6.5)
NEUT%: 23.1 % — AB (ref 38.4–76.8)
Platelets: 155 10*3/uL (ref 145–400)
RBC: 4.26 10*6/uL (ref 3.70–5.45)
RDW: 16.7 % — ABNORMAL HIGH (ref 11.2–14.5)
WBC: 3.3 10*3/uL — ABNORMAL LOW (ref 3.9–10.3)
lymph#: 1.9 10*3/uL (ref 0.9–3.3)

## 2016-04-22 NOTE — Assessment & Plan Note (Signed)
This is likely due to recent treatment. The patient denies recent history of fevers, cough, chills, diarrhea or dysuria. She is asymptomatic from the leukopenia. I will observe for now.   I would give her a treatment break as above. Right now, she does not require prophylactic growth factor injection

## 2016-04-22 NOTE — Telephone Encounter (Signed)
Gave and printed appt sched and avs for pt for July  °

## 2016-04-22 NOTE — Assessment & Plan Note (Signed)
I reviewed recent blood work with the patient and family members. She has a remarkable response with near complete remission after only 3 cycles of treatment. Due to neutropenia, I will continue to hold therapy. I will give her several weeks of break and see her back on 7/11//2017 for further review. At that time, I plan to transition her to maintenance therapy. The patient does not tolerate dexamethasone taper. I recommend increasing dexamethasone back to trauma milligrams weekly on Tuesdays She will continue aspirin therapy for DVT prophylaxis along with acyclovir for antimicrobial prophylaxis She will take calcium with vitamin D supplement. Her next Zometa dose will be due in June 2017.

## 2016-04-22 NOTE — Progress Notes (Signed)
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 She is here accompanied by her sister. She complained of persistent back pain, well-controlled with current pain medicine. Since I initiated dexamethasone taper, consistent noticed some mild, progressive decline in energy and appetite.  REVIEW OF SYSTEMS:   Constitutional: Denies fevers, chills or abnormal weight loss Eyes: Denies blurriness of vision Ears, nose, mouth, throat, and face: Denies mucositis or sore throat Respiratory: Denies cough, dyspnea or wheezes Cardiovascular: Denies palpitation, chest discomfort or lower extremity swelling Gastrointestinal:  Denies nausea, heartburn or change in bowel habits Skin: Denies abnormal skin rashes Lymphatics: Denies new lymphadenopathy or easy bruising Neurological:Denies numbness, tingling or new weaknesses Behavioral/Psych: Mood is stable, no new changes  All other systems were reviewed with the patient and are negative.  I have reviewed the past medical history, past surgical history, social history and family history with the patient and they are unchanged from previous note.  ALLERGIES:  is allergic to pravastatin and  zocor.  MEDICATIONS:  Current Outpatient Prescriptions  Medication Sig Dispense Refill  . acyclovir (ZOVIRAX) 400 MG tablet Take 1 tablet (400 mg total) by mouth daily. 60 tablet 3  . cyclobenzaprine (FLEXERIL) 5 MG tablet Take 1 tablet (5 mg total) by mouth 2 (two) times daily as needed for muscle spasms (muscle cramps). 60 tablet 0  . dexamethasone (DECADRON) 4 MG tablet Take 16 mg by mouth once a week. Tuesdays    . dexamethasone (DECADRON) 4 MG tablet Take 1 tablet (4 mg total) by mouth once a week. 10 tablet 1  . lenalidomide (REVLIMID) 5 MG capsule Take 1 capsule daily for 14 days then rest 7 days 14 capsule 0  . lisinopril (PRINIVIL,ZESTRIL) 40 MG tablet Take 1 tablet (40 mg total) by mouth daily. 30 tablet 11  . oxyCODONE-acetaminophen (ROXICET) 5-325 MG tablet Take 1 tablet by mouth every 6 (six) hours as needed for severe pain. 90 tablet 0   No current facility-administered medications for this visit.    PHYSICAL EXAMINATION: ECOG PERFORMANCE STATUS: 1 - Symptomatic but completely ambulatory  Filed Vitals:   04/22/16 0902  BP: 166/87  Pulse: 62  Temp: 97.6 F (36.4 C)  Resp: 17   Filed Weights   04/22/16 0902  Weight: 132 lb 4.8 oz (60.011 kg)    GENERAL:alert, no distress and comfortable SKIN: skin color, texture, turgor are normal, no rashes or significant lesions EYES: normal, Conjunctiva are pink and non-injected, sclera clear Musculoskeletal:no cyanosis of digits and no clubbing  NEURO: alert & oriented x 3 with fluent speech, no focal motor/sensory deficits  LABORATORY DATA:  I have reviewed the data as listed    Component Value Date/Time   NA 143 04/22/2016 0829   NA 139 01/17/2016 0910   K 4.3 04/22/2016 0829  K 4.5 01/17/2016 0910   CL 102 01/17/2016 0910   CO2 25 04/22/2016 0829   CO2 26 01/17/2016 0910   GLUCOSE 97 04/22/2016 0829   GLUCOSE 103* 01/17/2016 0910   BUN 7.3 04/22/2016 0829   BUN 20 01/17/2016 0910   CREATININE 0.7 04/22/2016  0829   CREATININE 0.87 01/17/2016 0910   CREATININE 1.03 08/16/2010 2050   CALCIUM 9.2 04/22/2016 0829   CALCIUM 9.3 01/17/2016 0910   PROT 6.3* 04/22/2016 0829   PROT 5.5* 04/01/2016 0824   PROT 7.9 01/17/2016 0910   ALBUMIN 3.8 04/22/2016 0829   ALBUMIN 3.8 01/17/2016 0910   AST 18 04/22/2016 0829   AST 17 01/17/2016 0910   ALT 12 04/22/2016 0829   ALT 11 01/17/2016 0910   ALKPHOS 50 04/22/2016 0829   ALKPHOS 45 01/17/2016 0910   BILITOT 0.48 04/22/2016 0829   BILITOT 0.4 01/17/2016 0910   GFRNONAA 64 01/17/2016 0910   GFRNONAA 53* 08/16/2010 2050   GFRAA 74 01/17/2016 0910   GFRAA  08/16/2010 2050    >60        The eGFR has been calculated using the MDRD equation. This calculation has not been validated in all clinical situations. eGFR's persistently <60 mL/min signify possible Chronic Kidney Disease.    No results found for: SPEP, UPEP  Lab Results  Component Value Date   WBC 3.3* 04/22/2016   NEUTROABS 0.8* 04/22/2016   HGB 12.8 04/22/2016   HCT 39.2 04/22/2016   MCV 92.0 04/22/2016   PLT 155 04/22/2016      Chemistry      Component Value Date/Time   NA 143 04/22/2016 0829   NA 139 01/17/2016 0910   K 4.3 04/22/2016 0829   K 4.5 01/17/2016 0910   CL 102 01/17/2016 0910   CO2 25 04/22/2016 0829   CO2 26 01/17/2016 0910   BUN 7.3 04/22/2016 0829   BUN 20 01/17/2016 0910   CREATININE 0.7 04/22/2016 0829   CREATININE 0.87 01/17/2016 0910   CREATININE 1.03 08/16/2010 2050      Component Value Date/Time   CALCIUM 9.2 04/22/2016 0829   CALCIUM 9.3 01/17/2016 0910   ALKPHOS 50 04/22/2016 0829   ALKPHOS 45 01/17/2016 0910   AST 18 04/22/2016 0829   AST 17 01/17/2016 0910   ALT 12 04/22/2016 0829   ALT 11 01/17/2016 0910   BILITOT 0.48 04/22/2016 0829   BILITOT 0.4 01/17/2016 0910      ASSESSMENT & PLAN:  Multiple myeloma in remission (HCC) I reviewed recent blood work with the patient and family members. She has a remarkable response with near  complete remission after only 3 cycles of treatment. Due to neutropenia, I will continue to hold therapy. I will give her several weeks of break and see her back on 7/11//2017 for further review. At that time, I plan to transition her to maintenance therapy. The patient does not tolerate dexamethasone taper. I recommend increasing dexamethasone back to trauma milligrams weekly on Tuesdays She will continue aspirin therapy for DVT prophylaxis along with acyclovir for antimicrobial prophylaxis She will take calcium with vitamin D supplement. Her next Zometa dose will be due in June 2017.  Drug-induced neutropenia (HCC) This is likely due to recent treatment. The patient denies recent history of fevers, cough, chills, diarrhea or dysuria. She is asymptomatic from the leukopenia. I will observe for now.   I would give her a treatment break as above. Right now, she does not require prophylactic growth factor  injection  Memory deficits She has very poor memory according to her sister. I also noticed that Her sister is the dedicated MPOA. We discussed prognosis and future treatment direction.   Orders Placed This Encounter  Procedures  . CBC with Differential    Standing Status: Standing     Number of Occurrences: 20     Standing Expiration Date: 04/23/2017  . Comprehensive metabolic panel    Standing Status: Standing     Number of Occurrences: 20     Standing Expiration Date: 04/23/2017   All questions were answered. The patient knows to call the clinic with any problems, questions or concerns. No barriers to learning was detected. I spent 15 minutes counseling the patient face to face. The total time spent in the appointment was 20 minutes and more than 50% was on counseling and review of test results     [facility], [place], MD 04/22/2016 9:47 AM

## 2016-04-22 NOTE — Assessment & Plan Note (Signed)
She has very poor memory according to her sister. I also noticed that Her sister is the dedicated MPOA. We discussed prognosis and future treatment direction.

## 2016-04-25 ENCOUNTER — Ambulatory Visit: Payer: Commercial Managed Care - HMO

## 2016-04-29 ENCOUNTER — Ambulatory Visit: Payer: Commercial Managed Care - HMO

## 2016-04-29 ENCOUNTER — Other Ambulatory Visit: Payer: Commercial Managed Care - HMO

## 2016-05-02 ENCOUNTER — Ambulatory Visit: Payer: Commercial Managed Care - HMO

## 2016-05-05 ENCOUNTER — Other Ambulatory Visit: Payer: Self-pay | Admitting: *Deleted

## 2016-05-05 MED ORDER — DEXAMETHASONE 4 MG PO TABS
ORAL_TABLET | ORAL | Status: DC
Start: 2016-05-05 — End: 2016-05-27

## 2016-05-05 MED FILL — DEXAMETHASONE 4 MG TABLET: 4 | 30 days supply | Qty: 30 | Fill #0

## 2016-05-05 NOTE — Telephone Encounter (Signed)
Left message to give prednisone according to taper Dr Alvy Bimler outlined at last office visit. To call if has questions. Refill sent to [facility]

## 2016-05-26 MED FILL — ACYCLOVIR 400 MG TABLET: 400 | 60 days supply | Qty: 60 | Fill #2

## 2016-05-27 ENCOUNTER — Telehealth: Payer: Self-pay | Admitting: Hematology and Oncology

## 2016-05-27 ENCOUNTER — Ambulatory Visit (HOSPITAL_BASED_OUTPATIENT_CLINIC_OR_DEPARTMENT_OTHER): Payer: Commercial Managed Care - HMO | Admitting: Hematology and Oncology

## 2016-05-27 ENCOUNTER — Ambulatory Visit (HOSPITAL_BASED_OUTPATIENT_CLINIC_OR_DEPARTMENT_OTHER): Payer: Commercial Managed Care - HMO

## 2016-05-27 ENCOUNTER — Other Ambulatory Visit: Payer: Self-pay | Admitting: Hematology and Oncology

## 2016-05-27 VITALS — BP 129/74 | HR 70 | Temp 97.7°F | Resp 17 | Wt 134.7 lb

## 2016-05-27 DIAGNOSIS — Z5112 Encounter for antineoplastic immunotherapy: Secondary | ICD-10-CM

## 2016-05-27 DIAGNOSIS — C9001 Multiple myeloma in remission: Secondary | ICD-10-CM | POA: Diagnosis not present

## 2016-05-27 DIAGNOSIS — D702 Other drug-induced agranulocytosis: Secondary | ICD-10-CM

## 2016-05-27 DIAGNOSIS — G893 Neoplasm related pain (acute) (chronic): Secondary | ICD-10-CM | POA: Diagnosis not present

## 2016-05-27 DIAGNOSIS — M48061 Spinal stenosis, lumbar region without neurogenic claudication: Secondary | ICD-10-CM

## 2016-05-27 DIAGNOSIS — C9000 Multiple myeloma not having achieved remission: Secondary | ICD-10-CM

## 2016-05-27 LAB — CBC WITH DIFFERENTIAL/PLATELET
BASO%: 0.3 % (ref 0.0–2.0)
BASOS ABS: 0 10*3/uL (ref 0.0–0.1)
EOS ABS: 0.1 10*3/uL (ref 0.0–0.5)
EOS%: 2.6 % (ref 0.0–7.0)
HEMATOCRIT: 37.9 % (ref 34.8–46.6)
HGB: 12.4 g/dL (ref 11.6–15.9)
LYMPH#: 1.2 10*3/uL (ref 0.9–3.3)
LYMPH%: 39.9 % (ref 14.0–49.7)
MCH: 29.5 pg (ref 25.1–34.0)
MCHC: 32.7 g/dL (ref 31.5–36.0)
MCV: 90 fL (ref 79.5–101.0)
MONO#: 0.3 10*3/uL (ref 0.1–0.9)
MONO%: 10.9 % (ref 0.0–14.0)
NEUT#: 1.4 10*3/uL — ABNORMAL LOW (ref 1.5–6.5)
NEUT%: 46.3 % (ref 38.4–76.8)
PLATELETS: 178 10*3/uL (ref 145–400)
RBC: 4.21 10*6/uL (ref 3.70–5.45)
RDW: 16.9 % — ABNORMAL HIGH (ref 11.2–14.5)
WBC: 3 10*3/uL — AB (ref 3.9–10.3)

## 2016-05-27 LAB — COMPREHENSIVE METABOLIC PANEL
ALBUMIN: 3.6 g/dL (ref 3.5–5.0)
ALK PHOS: 52 U/L (ref 40–150)
ALT: 16 U/L (ref 0–55)
ANION GAP: 9 meq/L (ref 3–11)
AST: 19 U/L (ref 5–34)
BUN: 12.2 mg/dL (ref 7.0–26.0)
CALCIUM: 8.8 mg/dL (ref 8.4–10.4)
CHLORIDE: 110 meq/L — AB (ref 98–109)
CO2: 24 mEq/L (ref 22–29)
Creatinine: 0.7 mg/dL (ref 0.6–1.1)
EGFR: 82 mL/min/{1.73_m2} — ABNORMAL LOW (ref >90)
Glucose: 120 mg/dl (ref 70–140)
POTASSIUM: 3.8 meq/L (ref 3.5–5.1)
Sodium: 143 mEq/L (ref 136–145)
Total Bilirubin: 0.44 mg/dL (ref 0.20–1.20)
Total Protein: 6.3 g/dL — ABNORMAL LOW (ref 6.4–8.3)

## 2016-05-27 MED ORDER — PROCHLORPERAZINE MALEATE 10 MG PO TABS
10.0000 mg | ORAL_TABLET | Freq: Once | ORAL | Status: AC
  Administered 2016-05-27: 10 mg via ORAL

## 2016-05-27 MED ORDER — BORTEZOMIB CHEMO SQ INJECTION 3.5 MG (2.5MG/ML)
0.9750 mg/m2 | Freq: Once | INTRAMUSCULAR | Status: AC
  Administered 2016-05-27: 1.5 mg via SUBCUTANEOUS
  Filled 2016-05-27: qty 1.5

## 2016-05-27 MED ORDER — SODIUM CHLORIDE 0.9 % IV SOLN
Freq: Once | INTRAVENOUS | Status: AC
  Administered 2016-05-27: 10:00:00 via INTRAVENOUS

## 2016-05-27 MED ORDER — ZOLEDRONIC ACID 4 MG/5ML IV CONC
3.5000 mg | Freq: Once | INTRAVENOUS | Status: AC
  Administered 2016-05-27: 3.5 mg via INTRAVENOUS
  Filled 2016-05-27: qty 4.38

## 2016-05-27 MED ORDER — PROCHLORPERAZINE MALEATE 10 MG PO TABS
ORAL_TABLET | ORAL | Status: AC
  Filled 2016-05-27: qty 1

## 2016-05-27 MED ORDER — OXYCODONE-ACETAMINOPHEN 5-325 MG PO TABS: 1.0000 | ORAL_TABLET | Freq: Four times a day (QID) | ORAL | Status: DC | PRN

## 2016-05-27 MED FILL — OXYCODONE/APAP 5/325MG: 5-325 | 22 days supply | Qty: 90 | Fill #0

## 2016-05-27 NOTE — Assessment & Plan Note (Signed)
I reviewed recent blood work with the patient and family members. She has a remarkable response with near complete remission after only 3 cycles of treatment. Due to neutropenia, I recommend discontinuation of Revlimid and I plan to transition her to maintenance therapy. I recommend dexamethasone taper She will continue aspirin therapy for DVT prophylaxis along with acyclovir for antimicrobial prophylaxis She will take calcium with vitamin D supplement. Her next Zometa dose will be due in today

## 2016-05-27 NOTE — Assessment & Plan Note (Signed)
Her pain is mildly improved since we begun treatment. She will continue to take the medication as needed for pain. She does not need radiation therapy for now. I refilled her prescription of Percocet. We discussed narcotics refill policy

## 2016-05-27 NOTE — Assessment & Plan Note (Signed)
This is likely due to recent treatment. The patient denies recent history of fevers, cough, chills, diarrhea or dysuria. She is asymptomatic from the leukopenia. I will observe for now.   I will reduce the dose of Velcade Right now, she does not require prophylactic growth factor injection

## 2016-05-27 NOTE — Telephone Encounter (Signed)
per pof to sch pt appt-gave pt copy of favs

## 2016-05-27 NOTE — Progress Notes (Signed)
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 She is here accompanied by her sister. The patient has extremely poor memory. She continues to have intermittent bone pain but only requires to take Percocet several times a week. She had mild intermittent constipation with pain medicine, relieved by laxatives There was no reported nausea. No reported skin rashes or peripheral neuropathy from treatment. She denies recent infection  REVIEW OF SYSTEMS:   Constitutional: Denies fevers, chills or abnormal weight loss Eyes: Denies blurriness of vision Ears, nose, mouth, throat, and face: Denies mucositis or sore throat Respiratory: Denies cough, dyspnea or wheezes Cardiovascular: Denies palpitation, chest discomfort or lower extremity swelling Skin: Denies abnormal skin rashes Lymphatics: Denies new lymphadenopathy or easy bruising Neurological:Denies numbness, tingling or new weaknesses Behavioral/Psych: Mood is stable, no new changes  All other systems were reviewed with the patient and are negative.  I have reviewed the past medical history, past surgical history, social history and family history with the patient and they are unchanged from  previous note.  ALLERGIES:  is allergic to pravastatin and zocor.  MEDICATIONS:  Current Outpatient Prescriptions  Medication Sig Dispense Refill  . acyclovir (ZOVIRAX) 400 MG tablet Take 1 tablet (400 mg total) by mouth daily. 60 tablet 3  . cyclobenzaprine (FLEXERIL) 5 MG tablet Take 1 tablet (5 mg total) by mouth 2 (two) times daily as needed for muscle spasms (muscle cramps). 60 tablet 0  . lisinopril (PRINIVIL,ZESTRIL) 40 MG tablet Take 1 tablet (40 mg total) by mouth daily. 30 tablet 11  . oxyCODONE-acetaminophen (ROXICET) 5-325 MG tablet Take 1 tablet by mouth every 6 (six) hours as needed for severe pain. 90 tablet 0   No current facility-administered medications for this visit.    PHYSICAL EXAMINATION: ECOG PERFORMANCE STATUS: 0 - Asymptomatic  Filed Vitals:   05/27/16 0921  BP: 129/74  Pulse: 70  Temp: 97.7 F (36.5 C)  Resp: 17   Filed Weights   05/27/16 0921  Weight: 134 lb 11.2 oz (61.1 kg)    GENERAL:alert, no distress and comfortable SKIN: skin color, texture, turgor are normal, no rashes or significant lesions EYES: normal, Conjunctiva are pink and non-injected, sclera clear OROPHARYNX:no exudate, no erythema and lips, buccal mucosa, and tongue normal  NECK: supple, thyroid normal size, non-tender, without nodularity LYMPH:  no palpable lymphadenopathy in the cervical, axillary or inguinal LUNGS: clear to auscultation and percussion with normal breathing effort HEART: regular rate & rhythm and no murmurs and no lower extremity edema ABDOMEN:abdomen soft, non-tender and normal bowel sounds Musculoskeletal:no cyanosis of digits and no clubbing  NEURO: alert & oriented x 3 with fluent speech, no focal motor/sensory deficits  LABORATORY DATA:  I have reviewed the data as listed    Component Value Date/Time   NA 143  04/22/2016 0829   NA 139 01/17/2016 0910   K 4.3 04/22/2016 0829   K 4.5 01/17/2016 0910   CL 102 01/17/2016 0910   CO2 25 04/22/2016 0829    CO2 26 01/17/2016 0910   GLUCOSE 97 04/22/2016 0829   GLUCOSE 103* 01/17/2016 0910   BUN 7.3 04/22/2016 0829   BUN 20 01/17/2016 0910   CREATININE 0.7 04/22/2016 0829   CREATININE 0.87 01/17/2016 0910   CREATININE 1.03 08/16/2010 2050   CALCIUM 9.2 04/22/2016 0829   CALCIUM 9.3 01/17/2016 0910   PROT 6.3* 04/22/2016 0829   PROT 5.5* 04/01/2016 0824   PROT 7.9 01/17/2016 0910   ALBUMIN 3.8 04/22/2016 0829   ALBUMIN 3.8 01/17/2016 0910   AST 18 04/22/2016 0829   AST 17 01/17/2016 0910   ALT 12 04/22/2016 0829   ALT 11 01/17/2016 0910   ALKPHOS 50 04/22/2016 0829   ALKPHOS 45 01/17/2016 0910   BILITOT 0.48 04/22/2016 0829   BILITOT 0.4 01/17/2016 0910   GFRNONAA 64 01/17/2016 0910   GFRNONAA 53* 08/16/2010 2050   GFRAA 74 01/17/2016 0910   GFRAA  08/16/2010 2050    >60        The eGFR has been calculated using the MDRD equation. This calculation has not been validated in all clinical situations. eGFR's persistently <60 mL/min signify possible Chronic Kidney Disease.    No results found for: SPEP, UPEP  Lab Results  Component Value Date   WBC 3.0* 05/27/2016   NEUTROABS 1.4* 05/27/2016   HGB 12.4 05/27/2016   HCT 37.9 05/27/2016   MCV 90.0 05/27/2016   PLT 178 05/27/2016      Chemistry      Component Value Date/Time   NA 143 04/22/2016 0829   NA 139 01/17/2016 0910   K 4.3 04/22/2016 0829   K 4.5 01/17/2016 0910   CL 102 01/17/2016 0910   CO2 25 04/22/2016 0829   CO2 26 01/17/2016 0910   BUN 7.3 04/22/2016 0829   BUN 20 01/17/2016 0910   CREATININE 0.7 04/22/2016 0829   CREATININE 0.87 01/17/2016 0910   CREATININE 1.03 08/16/2010 2050      Component Value Date/Time   CALCIUM 9.2 04/22/2016 0829   CALCIUM 9.3 01/17/2016 0910   ALKPHOS 50 04/22/2016 0829   ALKPHOS 45 01/17/2016 0910   AST 18 04/22/2016 0829   AST 17 01/17/2016 0910   ALT 12 04/22/2016 0829   ALT 11 01/17/2016 0910   BILITOT 0.48 04/22/2016 0829   BILITOT 0.4 01/17/2016 0910       ASSESSMENT & PLAN:  Multiple myeloma in remission ([place]) I reviewed recent blood work with the patient and family members. She has a remarkable response with near complete remission after only 3 cycles of treatment. Due to neutropenia, I recommend discontinuation of Revlimid and I plan to transition her to maintenance therapy. I recommend dexamethasone taper She will continue aspirin therapy for DVT prophylaxis along with acyclovir for antimicrobial prophylaxis She will take calcium with vitamin D supplement. Her next Zometa dose will be due in today  Drug-induced neutropenia ([place]) This is likely due to recent treatment. The patient denies recent history of fevers, cough, chills, diarrhea or dysuria. She is asymptomatic from the leukopenia. I will observe for now.   I will reduce the dose of Velcade Right now, she does not require prophylactic growth factor injection    Cancer associated pain Her pain is mildly improved since we begun treatment. She will continue to take  the medication as needed for pain. She does not need radiation therapy for now. I refilled her prescription of Percocet. We discussed narcotics refill policy      No orders of the defined types were placed in this encounter.   All questions were answered. The patient knows to call the clinic with any problems, questions or concerns. No barriers to learning was detected. I spent 15 minutes counseling the patient face to face. The total time spent in the appointment was 20 minutes and more than 50% was on counseling and review of test results     [facility], [place], MD 05/27/2016 9:40 AM

## 2016-05-27 NOTE — Patient Instructions (Signed)

## 2016-05-28 LAB — KAPPA/LAMBDA LIGHT CHAINS
IG KAPPA FREE LIGHT CHAIN: 153.9 mg/L — AB (ref 3.3–19.4)
Ig Lambda Free Light Chain: 3 mg/L — ABNORMAL LOW (ref 5.7–26.3)
Kappa/Lambda FluidC Ratio: 51.3 — ABNORMAL HIGH (ref 0.26–1.65)

## 2016-05-29 LAB — PROTEIN ELECTROPHORESIS, SERUM
A/G Ratio: 1.5 (ref 0.7–1.7)
ALPHA 1: 0.2 g/dL (ref 0.0–0.4)
ALPHA 2: 0.7 g/dL (ref 0.4–1.0)
Albumin: 3.4 g/dL (ref 2.9–4.4)
Beta: 0.9 g/dL (ref 0.7–1.3)
GAMMA GLOBULIN: 0.4 g/dL (ref 0.4–1.8)
GLOBULIN, TOTAL: 2.2 g/dL (ref 2.2–3.9)
TOTAL PROTEIN: 5.6 g/dL — AB (ref 6.0–8.5)

## 2016-05-30 ENCOUNTER — Other Ambulatory Visit: Payer: Self-pay | Admitting: Hematology and Oncology

## 2016-05-30 ENCOUNTER — Other Ambulatory Visit: Payer: Self-pay | Admitting: *Deleted

## 2016-05-30 DIAGNOSIS — C9001 Multiple myeloma in remission: Secondary | ICD-10-CM

## 2016-06-02 LAB — IMMUNOFIXATION ELECTROPHORESIS
IGA/IMMUNOGLOBULIN A, SERUM: 202 mg/dL (ref 64–422)
IGG (IMMUNOGLOBIN G), SERUM: 379 mg/dL — AB (ref 700–1600)
IgM, Qn, Serum: 10 mg/dL — ABNORMAL LOW (ref 26–217)
Total Protein: 5.9 g/dL — ABNORMAL LOW (ref 6.0–8.5)

## 2016-06-09 ENCOUNTER — Telehealth: Payer: Self-pay | Admitting: Family Medicine

## 2016-06-09 NOTE — Telephone Encounter (Signed)
[phone] Patient sister Caron Presume calling to talk to you regarding how [facility] is acting and possible alzheimers

## 2016-06-10 ENCOUNTER — Other Ambulatory Visit (HOSPITAL_BASED_OUTPATIENT_CLINIC_OR_DEPARTMENT_OTHER): Payer: Commercial Managed Care - HMO

## 2016-06-10 ENCOUNTER — Ambulatory Visit: Payer: Commercial Managed Care - HMO

## 2016-06-10 DIAGNOSIS — C9001 Multiple myeloma in remission: Secondary | ICD-10-CM | POA: Diagnosis not present

## 2016-06-10 LAB — CBC WITH DIFFERENTIAL/PLATELET
BASO%: 0.7 % (ref 0.0–2.0)
Basophils Absolute: 0 10*3/uL (ref 0.0–0.1)
EOS ABS: 0.1 10*3/uL (ref 0.0–0.5)
EOS%: 1.8 % (ref 0.0–7.0)
HEMATOCRIT: 40.1 % (ref 34.8–46.6)
HEMOGLOBIN: 13.1 g/dL (ref 11.6–15.9)
LYMPH#: 0.8 10*3/uL — AB (ref 0.9–3.3)
LYMPH%: 14.1 % (ref 14.0–49.7)
MCH: 29.5 pg (ref 25.1–34.0)
MCHC: 32.7 g/dL (ref 31.5–36.0)
MCV: 90.3 fL (ref 79.5–101.0)
MONO#: 0.3 10*3/uL (ref 0.1–0.9)
MONO%: 4.6 % (ref 0.0–14.0)
NEUT%: 78.8 % — ABNORMAL HIGH (ref 38.4–76.8)
NEUTROS ABS: 4.6 10*3/uL (ref 1.5–6.5)
PLATELETS: 189 10*3/uL (ref 145–400)
RBC: 4.44 10*6/uL (ref 3.70–5.45)
RDW: 18.1 % — AB (ref 11.2–14.5)
WBC: 5.9 10*3/uL (ref 3.9–10.3)

## 2016-06-10 LAB — COMPREHENSIVE METABOLIC PANEL
ALBUMIN: 3.6 g/dL (ref 3.5–5.0)
ALK PHOS: 63 U/L (ref 40–150)
ALT: 16 U/L (ref 0–55)
ANION GAP: 8 meq/L (ref 3–11)
AST: 16 U/L (ref 5–34)
BILIRUBIN TOTAL: 0.5 mg/dL (ref 0.20–1.20)
BUN: 14.5 mg/dL (ref 7.0–26.0)
CALCIUM: 9.3 mg/dL (ref 8.4–10.4)
CO2: 26 mEq/L (ref 22–29)
CREATININE: 0.7 mg/dL (ref 0.6–1.1)
Chloride: 107 mEq/L (ref 98–109)
EGFR: 78 mL/min/{1.73_m2} — AB (ref >90)
Glucose: 113 mg/dl (ref 70–140)
Potassium: 4.7 mEq/L (ref 3.5–5.1)
Sodium: 141 mEq/L (ref 136–145)
TOTAL PROTEIN: 6.8 g/dL (ref 6.4–8.3)

## 2016-06-10 NOTE — Telephone Encounter (Signed)
LMTRC

## 2016-06-17 ENCOUNTER — Telehealth: Payer: Self-pay

## 2016-06-17 NOTE — Telephone Encounter (Signed)
Sister called. Pt had received call requesting a call back. No note found in chart. Pt has appt 06/24/16 that this RN confirmed.

## 2016-06-24 ENCOUNTER — Telehealth: Payer: Self-pay | Admitting: *Deleted

## 2016-06-24 ENCOUNTER — Other Ambulatory Visit (HOSPITAL_BASED_OUTPATIENT_CLINIC_OR_DEPARTMENT_OTHER): Payer: Commercial Managed Care - HMO

## 2016-06-24 ENCOUNTER — Ambulatory Visit (HOSPITAL_BASED_OUTPATIENT_CLINIC_OR_DEPARTMENT_OTHER): Payer: Commercial Managed Care - HMO

## 2016-06-24 ENCOUNTER — Encounter: Payer: Self-pay | Admitting: Hematology and Oncology

## 2016-06-24 ENCOUNTER — Telehealth: Payer: Self-pay | Admitting: Internal Medicine

## 2016-06-24 ENCOUNTER — Ambulatory Visit (HOSPITAL_BASED_OUTPATIENT_CLINIC_OR_DEPARTMENT_OTHER): Payer: Commercial Managed Care - HMO | Admitting: Hematology and Oncology

## 2016-06-24 VITALS — BP 107/70 | HR 65 | Temp 97.5°F | Resp 18 | Wt 128.0 lb

## 2016-06-24 DIAGNOSIS — D709 Neutropenia, unspecified: Secondary | ICD-10-CM

## 2016-06-24 DIAGNOSIS — Z5112 Encounter for antineoplastic immunotherapy: Secondary | ICD-10-CM | POA: Diagnosis not present

## 2016-06-24 DIAGNOSIS — R64 Cachexia: Secondary | ICD-10-CM

## 2016-06-24 DIAGNOSIS — C9001 Multiple myeloma in remission: Secondary | ICD-10-CM

## 2016-06-24 DIAGNOSIS — G893 Neoplasm related pain (acute) (chronic): Secondary | ICD-10-CM | POA: Diagnosis not present

## 2016-06-24 DIAGNOSIS — E441 Mild protein-calorie malnutrition: Secondary | ICD-10-CM

## 2016-06-24 DIAGNOSIS — D61818 Other pancytopenia: Secondary | ICD-10-CM

## 2016-06-24 LAB — CBC WITH DIFFERENTIAL/PLATELET
BASO%: 3.4 % — ABNORMAL HIGH (ref 0.0–2.0)
BASOS ABS: 0.1 10*3/uL (ref 0.0–0.1)
EOS ABS: 0.2 10*3/uL (ref 0.0–0.5)
EOS%: 3.8 % (ref 0.0–7.0)
HCT: 39.5 % (ref 34.8–46.6)
HEMOGLOBIN: 13 g/dL (ref 11.6–15.9)
LYMPH#: 1.3 10*3/uL (ref 0.9–3.3)
LYMPH%: 31.1 % (ref 14.0–49.7)
MCH: 29.4 pg (ref 25.1–34.0)
MCHC: 32.8 g/dL (ref 31.5–36.0)
MCV: 89.6 fL (ref 79.5–101.0)
MONO#: 0.5 10*3/uL (ref 0.1–0.9)
MONO%: 12.5 % (ref 0.0–14.0)
NEUT%: 49.2 % (ref 38.4–76.8)
NEUTROS ABS: 2 10*3/uL (ref 1.5–6.5)
Platelets: 182 10*3/uL (ref 145–400)
RBC: 4.41 10*6/uL (ref 3.70–5.45)
RDW: 17.4 % — AB (ref 11.2–14.5)
WBC: 4.1 10*3/uL (ref 3.9–10.3)

## 2016-06-24 LAB — COMPREHENSIVE METABOLIC PANEL
ALBUMIN: 3.3 g/dL — AB (ref 3.5–5.0)
ALK PHOS: 63 U/L (ref 40–150)
ALT: 17 U/L (ref 0–55)
AST: 19 U/L (ref 5–34)
Anion Gap: 8 mEq/L (ref 3–11)
BUN: 14.7 mg/dL (ref 7.0–26.0)
CHLORIDE: 108 meq/L (ref 98–109)
CO2: 24 mEq/L (ref 22–29)
Calcium: 9 mg/dL (ref 8.4–10.4)
Creatinine: 0.7 mg/dL (ref 0.6–1.1)
EGFR: 80 mL/min/{1.73_m2} — ABNORMAL LOW (ref >90)
GLUCOSE: 119 mg/dL (ref 70–140)
POTASSIUM: 4.6 meq/L (ref 3.5–5.1)
SODIUM: 141 meq/L (ref 136–145)
Total Bilirubin: 0.3 mg/dL (ref 0.20–1.20)
Total Protein: 6.5 g/dL (ref 6.4–8.3)

## 2016-06-24 MED ORDER — PROCHLORPERAZINE MALEATE 10 MG PO TABS
10.0000 mg | ORAL_TABLET | Freq: Once | ORAL | Status: AC
  Administered 2016-06-24: 10 mg via ORAL

## 2016-06-24 MED ORDER — PROCHLORPERAZINE MALEATE 10 MG PO TABS
ORAL_TABLET | ORAL | Status: AC
  Filled 2016-06-24: qty 1

## 2016-06-24 MED ORDER — DEXAMETHASONE 4 MG PO TABS: 4.0000 mg | ORAL_TABLET | Freq: Every day | ORAL | 1 refills | Status: DC

## 2016-06-24 MED ORDER — BORTEZOMIB CHEMO SQ INJECTION 3.5 MG (2.5MG/ML)
0.9750 mg/m2 | Freq: Once | INTRAMUSCULAR | Status: AC
  Administered 2016-06-24: 1.5 mg via SUBCUTANEOUS
  Filled 2016-06-24: qty 1.5

## 2016-06-24 MED FILL — DEXAMETHASONE 4 MG TABLET: 4 | 60 days supply | Qty: 60 | Fill #0

## 2016-06-24 NOTE — Assessment & Plan Note (Signed)
She has very poor appetite since I started weaning her off dexamethasone. I recommend switching strategy so instead of pulse weekly dexamethasone, I recommend she take dexamethasone daily in the morning with breakfast to see if we can stimulate her appetite

## 2016-06-24 NOTE — Telephone Encounter (Signed)
Gave pt cal & avs °

## 2016-06-24 NOTE — Assessment & Plan Note (Signed)
Her pain is mildly improved since we begun treatment. She will continue to take the medication as needed for pain. She does not need radiation therapy for now.

## 2016-06-24 NOTE — Progress Notes (Signed)
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 She returns for further follow-up. She has lost a lot of weight. Her bone pain is about the same. Denies recent infection. Denies peripheral neuropathy  REVIEW OF SYSTEMS:   Constitutional: Denies fevers, chills  Eyes: Denies blurriness of vision Ears, nose, mouth, throat, and face: Denies mucositis or sore throat Respiratory: Denies cough, dyspnea or wheezes Cardiovascular: Denies palpitation, chest discomfort or lower extremity swelling Gastrointestinal:  Denies nausea, heartburn or change in bowel habits Skin: Denies abnormal skin rashes Lymphatics: Denies new lymphadenopathy or easy bruising Neurological:Denies numbness, tingling or new weaknesses Behavioral/Psych: Mood is stable, no new changes  All other systems were reviewed with the patient and are negative.  I have reviewed the past medical history, past surgical history, social history and family history with the patient and they are unchanged from previous note.  ALLERGIES:  is allergic to pravastatin and zocor [simvastatin].  MEDICATIONS:  Current Outpatient Prescriptions  Medication  Sig Dispense Refill  . acyclovir (ZOVIRAX) 400 MG tablet Take 1 tablet (400 mg total) by mouth daily. 60 tablet 3  . cyclobenzaprine (FLEXERIL) 5 MG tablet Take 1 tablet (5 mg total) by mouth 2 (two) times daily as needed for muscle spasms (muscle cramps). 60 tablet 0  . lisinopril (PRINIVIL,ZESTRIL) 40 MG tablet Take 1 tablet (40 mg total) by mouth daily. 30 tablet 11  . oxyCODONE-acetaminophen (ROXICET) 5-325 MG tablet Take 1 tablet by mouth every 6 (six) hours as needed for severe pain. 90 tablet 0  . dexamethasone (DECADRON) 4 MG tablet Take 1 tablet (4 mg total) by mouth daily. 60 tablet 1   No current facility-administered medications for this visit.     PHYSICAL EXAMINATION: ECOG PERFORMANCE STATUS: 1 - Symptomatic but completely ambulatory  Vitals:   06/24/16 1009  BP: 107/70  Pulse: 65  Resp: 18  Temp: 97.5 F (36.4 C)   Filed Weights   06/24/16 1009  Weight: 128 lb (58.1 kg)    GENERAL:alert, no distress and comfortable SKIN: skin color, texture, turgor are normal, no rashes or significant lesions EYES: normal, Conjunctiva are pink and non-injected, sclera clear OROPHARYNX:no exudate, no erythema and lips, buccal mucosa, and tongue normal  NECK: supple, thyroid normal size, non-tender, without nodularity LYMPH:  no palpable lymphadenopathy in the cervical, axillary or inguinal LUNGS: clear to auscultation and percussion with normal breathing effort HEART: regular rate & rhythm and no murmurs and no lower extremity edema ABDOMEN:abdomen soft, non-tender and normal bowel sounds Musculoskeletal:no cyanosis of digits and no clubbing  NEURO: alert & oriented x 3 with fluent speech, no  focal motor/sensory deficits  LABORATORY DATA:  I have reviewed the data as listed    Component Value Date/Time   NA 141 06/24/2016 0949   K 4.6 06/24/2016 0949   CL 102 01/17/2016 0910   CO2 24 06/24/2016 0949   GLUCOSE 119 06/24/2016 0949   BUN 14.7 06/24/2016 0949   CREATININE 0.7  06/24/2016 0949   CALCIUM 9.0 06/24/2016 0949   PROT 6.5 06/24/2016 0949   ALBUMIN 3.3 (L) 06/24/2016 0949   AST 19 06/24/2016 0949   ALT 17 06/24/2016 0949   ALKPHOS 63 06/24/2016 0949   BILITOT 0.30 06/24/2016 0949   GFRNONAA 64 01/17/2016 0910   GFRAA 74 01/17/2016 0910    No results found for: SPEP, UPEP  Lab Results  Component Value Date   WBC 4.1 06/24/2016   NEUTROABS 2.0 06/24/2016   HGB 13.0 06/24/2016   HCT 39.5 06/24/2016   MCV 89.6 06/24/2016   PLT 182 06/24/2016      Chemistry      Component Value Date/Time   NA 141 06/24/2016 0949   K 4.6 06/24/2016 0949   CL 102 01/17/2016 0910   CO2 24 06/24/2016 0949   BUN 14.7 06/24/2016 0949   CREATININE 0.7 06/24/2016 0949      Component Value Date/Time   CALCIUM 9.0 06/24/2016 0949   ALKPHOS 63 06/24/2016 0949   AST 19 06/24/2016 0949   ALT 17 06/24/2016 0949   BILITOT 0.30 06/24/2016 0949      ASSESSMENT & PLAN:  Multiple myeloma in remission ([place]) I reviewed recent blood work with the patient and family members. She has a remarkable response with near complete remission after only 3 cycles of treatment. Due to neutropenia, I recommend discontinuation of Revlimid and I plan to transition her to maintenance therapy. She has completed that Revlimid treatment and will start Velcade maintains today She will continue acyclovir for antimicrobial prophylaxis She will take calcium with vitamin D supplement. Her next Zometa dose will be due in October I plan to redraw myeloma panel again in 2 weeks  Malignant cachexia ([place]) She has very poor appetite since I started weaning her off dexamethasone. I recommend switching strategy so instead of pulse weekly dexamethasone, I recommend she take dexamethasone daily in the morning with breakfast to see if we can stimulate her appetite  Cancer associated pain Her pain is mildly improved since we begun treatment. She will continue to take the medication as needed for  pain. She does not need radiation therapy for now.  Protein-calorie malnutrition, mild ([place]) She has lost a lot of weight since I initiated dexamethasone taper. I will start her on daily dexamethasone and recheck her weight month Will consult dietitian    Orders Placed This Encounter  Procedures  . Kappa/lambda light chains    Standing Status:   Future    Standing Expiration Date:   07/29/2017  . Multiple Myeloma Panel (SPEP&IFE w/QIG)    Standing Status:   Future    Standing Expiration Date:   07/29/2017   All questions were answered. The patient knows to call the clinic with any problems, questions or concerns. No barriers to learning was detected. I spent 15 minutes counseling the patient face to face. The total time spent in the appointment was 20 minutes and more than 50% was on counseling and review of test results     [facility], [place], MD 06/24/2016 3:59 PM

## 2016-06-24 NOTE — Telephone Encounter (Signed)
Per staff phone call and POF I have schedueld appts. Scheduler advised of appts.  JMW  

## 2016-06-24 NOTE — Assessment & Plan Note (Signed)
I reviewed recent blood work with the patient and family members. She has a remarkable response with near complete remission after only 3 cycles of treatment. Due to neutropenia, I recommend discontinuation of Revlimid and I plan to transition her to maintenance therapy. She has completed that Revlimid treatment and will start Velcade maintains today She will continue acyclovir for antimicrobial prophylaxis She will take calcium with vitamin D supplement. Her next Zometa dose will be due in October I plan to redraw myeloma panel again in 2 weeks

## 2016-06-24 NOTE — Assessment & Plan Note (Signed)
She has lost a lot of weight since I initiated dexamethasone taper. I will start her on daily dexamethasone and recheck her weight month Will consult dietitian

## 2016-06-24 NOTE — Patient Instructions (Signed)
Bortezomib injection What is this medicine? BORTEZOMIB (bor TEZ oh mib) is a medicine that targets proteins in cancer cells and stops the cancer cells from growing. It is used to treat multiple myeloma and mantle-cell lymphoma. This medicine may be used for other purposes; ask your health care provider or pharmacist if you have questions. What should I tell my health care provider before I take this medicine? They need to know if you have any of these conditions: -diabetes -heart disease -irregular heartbeat -liver disease -on hemodialysis -low blood counts, like low white blood cells, platelets, or hemoglobin -peripheral neuropathy -taking medicine for blood pressure -an unusual or allergic reaction to bortezomib, mannitol, boron, other medicines, foods, dyes, or preservatives -pregnant or trying to get pregnant -breast-feeding How should I use this medicine? This medicine is for injection into a vein or for injection under the skin. It is given by a health care professional in a hospital or clinic setting. Talk to your pediatrician regarding the use of this medicine in children. Special care may be needed. Overdosage: If you think you have taken too much of this medicine contact a poison control center or emergency room at once. NOTE: This medicine is only for you. Do not share this medicine with others. What if I miss a dose? It is important not to miss your dose. Call your doctor or health care professional if you are unable to keep an appointment. What may interact with this medicine? This medicine may interact with the following medications: -ketoconazole -rifampin -ritonavir -St. John's Wort This list may not describe all possible interactions. Give your health care provider a list of all the medicines, herbs, non-prescription drugs, or dietary supplements you use. Also tell them if you smoke, drink alcohol, or use illegal drugs. Some items may interact with your medicine. What  should I watch for while using this medicine? Visit your doctor for checks on your progress. This drug may make you feel generally unwell. This is not uncommon, as chemotherapy can affect healthy cells as well as cancer cells. Report any side effects. Continue your course of treatment even though you feel ill unless your doctor tells you to stop. You may get drowsy or dizzy. Do not drive, use machinery, or do anything that needs mental alertness until you know how this medicine affects you. Do not stand or sit up quickly, especially if you are an older patient. This reduces the risk of dizzy or fainting spells. In some cases, you may be given additional medicines to help with side effects. Follow all directions for their use. Call your doctor or health care professional for advice if you get a fever, chills or sore throat, or other symptoms of a cold or flu. Do not treat yourself. This drug decreases your body's ability to fight infections. Try to avoid being around people who are sick. This medicine may increase your risk to bruise or bleed. Call your doctor or health care professional if you notice any unusual bleeding. You may need blood work done while you are taking this medicine. In some patients, this medicine may cause a serious brain infection that may cause death. If you have any problems seeing, thinking, speaking, walking, or standing, tell your doctor right away. If you cannot reach your doctor, urgently seek other source of medical care. Do not become pregnant while taking this medicine. Women should inform their doctor if they wish to become pregnant or think they might be pregnant. There is a potential for serious  side effects to an unborn child. Talk to your health care professional or pharmacist for more information. Do not breast-feed an infant while taking this medicine. Check with your doctor or health care professional if you get an attack of severe diarrhea, nausea and vomiting, or if  you sweat a lot. The loss of too much body fluid can make it dangerous for you to take this medicine. What side effects may I notice from receiving this medicine? Side effects that you should report to your doctor or health care professional as soon as possible: -allergic reactions like skin rash, itching or hives, swelling of the face, lips, or tongue -breathing problems -changes in hearing -changes in vision -fast, irregular heartbeat -feeling faint or lightheaded, falls -pain, tingling, numbness in the hands or feet -right upper belly pain -seizures -swelling of the ankles, feet, hands -unusual bleeding or bruising -unusually weak or tired -vomiting -yellowing of the eyes or skin Side effects that usually do not require medical attention (report to your doctor or health care professional if they continue or are bothersome): -changes in emotions or moods -constipation -diarrhea -loss of appetite -headache -irritation at site where injected -nausea This list may not describe all possible side effects. Call your doctor for medical advice about side effects. You may report side effects to FDA at 1-800-FDA-1088. Where should I keep my medicine? This drug is given in a hospital or clinic and will not be stored at home. NOTE: This sheet is a summary. It may not cover all possible information. If you have questions about this medicine, talk to your doctor, pharmacist, or health care provider.    2016, Elsevier/Gold Standard. (2015-01-02 14:47:04)

## 2016-07-08 ENCOUNTER — Other Ambulatory Visit (HOSPITAL_BASED_OUTPATIENT_CLINIC_OR_DEPARTMENT_OTHER): Payer: Commercial Managed Care - HMO

## 2016-07-08 ENCOUNTER — Ambulatory Visit (HOSPITAL_BASED_OUTPATIENT_CLINIC_OR_DEPARTMENT_OTHER): Payer: Commercial Managed Care - HMO

## 2016-07-08 VITALS — BP 136/73 | HR 62 | Temp 97.8°F | Resp 18

## 2016-07-08 DIAGNOSIS — Z5112 Encounter for antineoplastic immunotherapy: Secondary | ICD-10-CM | POA: Diagnosis not present

## 2016-07-08 DIAGNOSIS — C9001 Multiple myeloma in remission: Secondary | ICD-10-CM

## 2016-07-08 LAB — CBC WITH DIFFERENTIAL/PLATELET
BASO%: 0.9 % (ref 0.0–2.0)
BASOS ABS: 0 10*3/uL (ref 0.0–0.1)
EOS ABS: 0.2 10*3/uL (ref 0.0–0.5)
EOS%: 5.9 % (ref 0.0–7.0)
HCT: 41.9 % (ref 34.8–46.6)
HEMOGLOBIN: 13.7 g/dL (ref 11.6–15.9)
LYMPH%: 31.5 % (ref 14.0–49.7)
MCH: 29.7 pg (ref 25.1–34.0)
MCHC: 32.8 g/dL (ref 31.5–36.0)
MCV: 90.5 fL (ref 79.5–101.0)
MONO#: 0.3 10*3/uL (ref 0.1–0.9)
MONO%: 8.9 % (ref 0.0–14.0)
NEUT%: 52.8 % (ref 38.4–76.8)
NEUTROS ABS: 2 10*3/uL (ref 1.5–6.5)
PLATELETS: 181 10*3/uL (ref 145–400)
RBC: 4.63 10*6/uL (ref 3.70–5.45)
RDW: 17.7 % — AB (ref 11.2–14.5)
WBC: 3.7 10*3/uL — AB (ref 3.9–10.3)
lymph#: 1.2 10*3/uL (ref 0.9–3.3)

## 2016-07-08 LAB — COMPREHENSIVE METABOLIC PANEL
ALBUMIN: 3.4 g/dL — AB (ref 3.5–5.0)
ALK PHOS: 53 U/L (ref 40–150)
ALT: 14 U/L (ref 0–55)
ANION GAP: 9 meq/L (ref 3–11)
AST: 17 U/L (ref 5–34)
BILIRUBIN TOTAL: 0.52 mg/dL (ref 0.20–1.20)
BUN: 9.3 mg/dL (ref 7.0–26.0)
CO2: 25 mEq/L (ref 22–29)
Calcium: 9.1 mg/dL (ref 8.4–10.4)
Chloride: 109 mEq/L (ref 98–109)
Creatinine: 0.7 mg/dL (ref 0.6–1.1)
EGFR: 83 mL/min/{1.73_m2} — AB (ref >90)
Glucose: 118 mg/dl (ref 70–140)
Potassium: 3.9 mEq/L (ref 3.5–5.1)
Sodium: 143 mEq/L (ref 136–145)
TOTAL PROTEIN: 6.6 g/dL (ref 6.4–8.3)

## 2016-07-08 MED ORDER — PROCHLORPERAZINE MALEATE 10 MG PO TABS
ORAL_TABLET | ORAL | Status: AC
  Filled 2016-07-08: qty 1

## 2016-07-08 MED ORDER — BORTEZOMIB CHEMO SQ INJECTION 3.5 MG (2.5MG/ML)
0.9750 mg/m2 | Freq: Once | INTRAMUSCULAR | Status: AC
  Administered 2016-07-08: 1.5 mg via SUBCUTANEOUS
  Filled 2016-07-08: qty 1.5

## 2016-07-08 MED ORDER — PROCHLORPERAZINE MALEATE 10 MG PO TABS
10.0000 mg | ORAL_TABLET | Freq: Once | ORAL | Status: AC
  Administered 2016-07-08: 10 mg via ORAL

## 2016-07-08 NOTE — Patient Instructions (Signed)
Bortezomib injection What is this medicine? BORTEZOMIB (bor TEZ oh mib) is a medicine that targets proteins in cancer cells and stops the cancer cells from growing. It is used to treat multiple myeloma and mantle-cell lymphoma. This medicine may be used for other purposes; ask your health care provider or pharmacist if you have questions. What should I tell my health care provider before I take this medicine? They need to know if you have any of these conditions: -diabetes -heart disease -irregular heartbeat -liver disease -on hemodialysis -low blood counts, like low white blood cells, platelets, or hemoglobin -peripheral neuropathy -taking medicine for blood pressure -an unusual or allergic reaction to bortezomib, mannitol, boron, other medicines, foods, dyes, or preservatives -pregnant or trying to get pregnant -breast-feeding How should I use this medicine? This medicine is for injection into a vein or for injection under the skin. It is given by a health care professional in a hospital or clinic setting. Talk to your pediatrician regarding the use of this medicine in children. Special care may be needed. Overdosage: If you think you have taken too much of this medicine contact a poison control center or emergency room at once. NOTE: This medicine is only for you. Do not share this medicine with others. What if I miss a dose? It is important not to miss your dose. Call your doctor or health care professional if you are unable to keep an appointment. What may interact with this medicine? This medicine may interact with the following medications: -ketoconazole -rifampin -ritonavir -St. John's Wort This list may not describe all possible interactions. Give your health care provider a list of all the medicines, herbs, non-prescription drugs, or dietary supplements you use. Also tell them if you smoke, drink alcohol, or use illegal drugs. Some items may interact with your medicine. What  should I watch for while using this medicine? Visit your doctor for checks on your progress. This drug may make you feel generally unwell. This is not uncommon, as chemotherapy can affect healthy cells as well as cancer cells. Report any side effects. Continue your course of treatment even though you feel ill unless your doctor tells you to stop. You may get drowsy or dizzy. Do not drive, use machinery, or do anything that needs mental alertness until you know how this medicine affects you. Do not stand or sit up quickly, especially if you are an older patient. This reduces the risk of dizzy or fainting spells. In some cases, you may be given additional medicines to help with side effects. Follow all directions for their use. Call your doctor or health care professional for advice if you get a fever, chills or sore throat, or other symptoms of a cold or flu. Do not treat yourself. This drug decreases your body's ability to fight infections. Try to avoid being around people who are sick. This medicine may increase your risk to bruise or bleed. Call your doctor or health care professional if you notice any unusual bleeding. You may need blood work done while you are taking this medicine. In some patients, this medicine may cause a serious brain infection that may cause death. If you have any problems seeing, thinking, speaking, walking, or standing, tell your doctor right away. If you cannot reach your doctor, urgently seek other source of medical care. Do not become pregnant while taking this medicine. Women should inform their doctor if they wish to become pregnant or think they might be pregnant. There is a potential for serious  side effects to an unborn child. Talk to your health care professional or pharmacist for more information. Do not breast-feed an infant while taking this medicine. Check with your doctor or health care professional if you get an attack of severe diarrhea, nausea and vomiting, or if  you sweat a lot. The loss of too much body fluid can make it dangerous for you to take this medicine. What side effects may I notice from receiving this medicine? Side effects that you should report to your doctor or health care professional as soon as possible: -allergic reactions like skin rash, itching or hives, swelling of the face, lips, or tongue -breathing problems -changes in hearing -changes in vision -fast, irregular heartbeat -feeling faint or lightheaded, falls -pain, tingling, numbness in the hands or feet -right upper belly pain -seizures -swelling of the ankles, feet, hands -unusual bleeding or bruising -unusually weak or tired -vomiting -yellowing of the eyes or skin Side effects that usually do not require medical attention (report to your doctor or health care professional if they continue or are bothersome): -changes in emotions or moods -constipation -diarrhea -loss of appetite -headache -irritation at site where injected -nausea This list may not describe all possible side effects. Call your doctor for medical advice about side effects. You may report side effects to FDA at 1-800-FDA-1088. Where should I keep my medicine? This drug is given in a hospital or clinic and will not be stored at home. NOTE: This sheet is a summary. It may not cover all possible information. If you have questions about this medicine, talk to your doctor, pharmacist, or health care provider.    2016, Elsevier/Gold Standard. (2015-01-02 14:47:04)

## 2016-07-09 LAB — KAPPA/LAMBDA LIGHT CHAINS
IG LAMBDA FREE LIGHT CHAIN: 7 mg/L (ref 5.7–26.3)
Ig Kappa Free Light Chain: 179.8 mg/L — ABNORMAL HIGH (ref 3.3–19.4)
KAPPA/LAMBDA FLC RATIO: 25.69 — AB (ref 0.26–1.65)

## 2016-07-11 LAB — MULTIPLE MYELOMA PANEL, SERUM
ALBUMIN SERPL ELPH-MCNC: 3.5 g/dL (ref 2.9–4.4)
ALPHA 1: 0.2 g/dL (ref 0.0–0.4)
ALPHA2 GLOB SERPL ELPH-MCNC: 0.8 g/dL (ref 0.4–1.0)
Albumin/Glob SerPl: 1.5 (ref 0.7–1.7)
B-GLOBULIN SERPL ELPH-MCNC: 1.1 g/dL (ref 0.7–1.3)
GAMMA GLOB SERPL ELPH-MCNC: 0.3 g/dL — AB (ref 0.4–1.8)
Globulin, Total: 2.4 g/dL (ref 2.2–3.9)
IgA, Qn, Serum: 237 mg/dL (ref 64–422)
IgG, Qn, Serum: 439 mg/dL — ABNORMAL LOW (ref 700–1600)
IgM, Qn, Serum: 12 mg/dL — ABNORMAL LOW (ref 26–217)
M Protein SerPl Elph-Mcnc: 0.2 g/dL — ABNORMAL HIGH
TOTAL PROTEIN: 5.9 g/dL — AB (ref 6.0–8.5)

## 2016-07-22 ENCOUNTER — Ambulatory Visit (HOSPITAL_BASED_OUTPATIENT_CLINIC_OR_DEPARTMENT_OTHER): Payer: Commercial Managed Care - HMO | Admitting: Hematology and Oncology

## 2016-07-22 ENCOUNTER — Telehealth: Payer: Self-pay | Admitting: Hematology and Oncology

## 2016-07-22 ENCOUNTER — Other Ambulatory Visit (HOSPITAL_BASED_OUTPATIENT_CLINIC_OR_DEPARTMENT_OTHER): Payer: Commercial Managed Care - HMO

## 2016-07-22 ENCOUNTER — Ambulatory Visit (HOSPITAL_BASED_OUTPATIENT_CLINIC_OR_DEPARTMENT_OTHER): Payer: Commercial Managed Care - HMO

## 2016-07-22 ENCOUNTER — Encounter: Payer: Self-pay | Admitting: Hematology and Oncology

## 2016-07-22 ENCOUNTER — Ambulatory Visit: Payer: Commercial Managed Care - HMO | Admitting: Nutrition

## 2016-07-22 VITALS — BP 142/73 | HR 65 | Temp 97.8°F | Resp 18 | Wt 133.9 lb

## 2016-07-22 DIAGNOSIS — G893 Neoplasm related pain (acute) (chronic): Secondary | ICD-10-CM | POA: Diagnosis not present

## 2016-07-22 DIAGNOSIS — D701 Agranulocytosis secondary to cancer chemotherapy: Secondary | ICD-10-CM | POA: Diagnosis not present

## 2016-07-22 DIAGNOSIS — Z5112 Encounter for antineoplastic immunotherapy: Secondary | ICD-10-CM

## 2016-07-22 DIAGNOSIS — C9000 Multiple myeloma not having achieved remission: Secondary | ICD-10-CM

## 2016-07-22 DIAGNOSIS — E441 Mild protein-calorie malnutrition: Secondary | ICD-10-CM

## 2016-07-22 DIAGNOSIS — C9001 Multiple myeloma in remission: Secondary | ICD-10-CM

## 2016-07-22 LAB — CBC WITH DIFFERENTIAL/PLATELET
BASO%: 0.6 % (ref 0.0–2.0)
Basophils Absolute: 0 10*3/uL (ref 0.0–0.1)
EOS%: 3.1 % (ref 0.0–7.0)
Eosinophils Absolute: 0.2 10*3/uL (ref 0.0–0.5)
HCT: 38 % (ref 34.8–46.6)
HGB: 12.5 g/dL (ref 11.6–15.9)
LYMPH%: 17.7 % (ref 14.0–49.7)
MCH: 29.8 pg (ref 25.1–34.0)
MCHC: 32.9 g/dL (ref 31.5–36.0)
MCV: 90.5 fL (ref 79.5–101.0)
MONO#: 0.5 10*3/uL (ref 0.1–0.9)
MONO%: 8.4 % (ref 0.0–14.0)
NEUT#: 4.5 10*3/uL (ref 1.5–6.5)
NEUT%: 70.2 % (ref 38.4–76.8)
PLATELETS: 156 10*3/uL (ref 145–400)
RBC: 4.2 10*6/uL (ref 3.70–5.45)
RDW: 17.3 % — AB (ref 11.2–14.5)
WBC: 6.4 10*3/uL (ref 3.9–10.3)
lymph#: 1.1 10*3/uL (ref 0.9–3.3)

## 2016-07-22 LAB — COMPREHENSIVE METABOLIC PANEL
ALT: 21 U/L (ref 0–55)
ANION GAP: 9 meq/L (ref 3–11)
AST: 20 U/L (ref 5–34)
Albumin: 3.4 g/dL — ABNORMAL LOW (ref 3.5–5.0)
Alkaline Phosphatase: 47 U/L (ref 40–150)
BUN: 21.4 mg/dL (ref 7.0–26.0)
CHLORIDE: 108 meq/L (ref 98–109)
CO2: 25 meq/L (ref 22–29)
CREATININE: 0.7 mg/dL (ref 0.6–1.1)
Calcium: 8.8 mg/dL (ref 8.4–10.4)
EGFR: 83 mL/min/{1.73_m2} — AB (ref >90)
Glucose: 99 mg/dl (ref 70–140)
POTASSIUM: 4.4 meq/L (ref 3.5–5.1)
Sodium: 143 mEq/L (ref 136–145)
Total Bilirubin: 0.61 mg/dL (ref 0.20–1.20)
Total Protein: 6.3 g/dL — ABNORMAL LOW (ref 6.4–8.3)

## 2016-07-22 MED ORDER — ACYCLOVIR 400 MG PO TABS: 400.0000 mg | ORAL_TABLET | Freq: Every day | ORAL | 3 refills | Status: DC

## 2016-07-22 MED ORDER — BORTEZOMIB CHEMO SQ INJECTION 3.5 MG (2.5MG/ML)
0.9750 mg/m2 | Freq: Once | INTRAMUSCULAR | Status: AC
  Administered 2016-07-22: 1.5 mg via SUBCUTANEOUS
  Filled 2016-07-22: qty 1.5

## 2016-07-22 MED ORDER — PROCHLORPERAZINE MALEATE 10 MG PO TABS
ORAL_TABLET | ORAL | Status: AC
  Filled 2016-07-22: qty 1

## 2016-07-22 MED ORDER — PROCHLORPERAZINE MALEATE 10 MG PO TABS
10.0000 mg | ORAL_TABLET | Freq: Once | ORAL | Status: AC
  Administered 2016-07-22: 10 mg via ORAL

## 2016-07-22 MED FILL — ACYCLOVIR 400 MG TABLET: 400 | 90 days supply | Qty: 90 | Fill #0

## 2016-07-22 NOTE — Progress Notes (Signed)
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 She is doing well. She is here with her sister today. Denies recent infection. She has gained weight since daily dose of dexamethasone. Denies side effects. No peripheral neuropathy  REVIEW OF SYSTEMS:   Constitutional: Denies fevers, chills or abnormal weight loss Eyes: Denies blurriness of vision Ears, nose, mouth, throat, and face: Denies mucositis or sore throat Respiratory: Denies cough, dyspnea or wheezes Cardiovascular: Denies palpitation, chest discomfort or lower extremity swelling Gastrointestinal:  Denies nausea, heartburn or change in bowel habits Skin: Denies abnormal skin rashes Lymphatics: Denies new lymphadenopathy or easy bruising Neurological:Denies numbness, tingling or new weaknesses Behavioral/Psych: Mood is stable, no new changes  All other systems were reviewed with the patient and are negative.  I have reviewed the past medical history, past surgical history, social history and family history with the patient and they are unchanged from previous note.  ALLERGIES:  is allergic to pravastatin and zocor  [simvastatin].  MEDICATIONS:  Current Outpatient Prescriptions  Medication Sig Dispense Refill  . acyclovir (ZOVIRAX) 400 MG tablet Take 1 tablet (400 mg total) by mouth daily. 90 tablet 3  . cyclobenzaprine (FLEXERIL) 5 MG tablet Take 1 tablet (5 mg total) by mouth 2 (two) times daily as needed for muscle spasms (muscle cramps). 60 tablet 0  . dexamethasone (DECADRON) 4 MG tablet Take 1 tablet (4 mg total) by mouth daily. 60 tablet 1  . lisinopril (PRINIVIL,ZESTRIL) 40 MG tablet Take 1 tablet (40 mg total) by mouth daily. 30 tablet 11  . oxyCODONE-acetaminophen (ROXICET) 5-325 MG tablet Take 1 tablet by mouth every 6 (six) hours as needed for severe pain. 90 tablet 0   No current facility-administered medications for this visit.    Facility-Administered Medications Ordered in Other Visits  Medication Dose Route Frequency Provider Last Rate Last Dose  . bortezomib SQ (VELCADE) chemo injection 1.5 mg  0.975 mg/m2 (Treatment Plan Recorded) Subcutaneous Once Heath Lark, MD        PHYSICAL EXAMINATION: ECOG PERFORMANCE STATUS: 1 - Symptomatic but completely ambulatory  Vitals:   07/22/16 1016  BP: (!) 142/73  Pulse: 65  Resp: 18  Temp: 97.8 F (36.6 C)   Filed Weights   07/22/16 1016  Weight: 133 lb 14.4 oz (60.7 kg)    GENERAL:alert, no distress and comfortable SKIN: skin color, texture, turgor are normal, no rashes or significant lesions EYES: normal, Conjunctiva are pink and non-injected, sclera clear OROPHARYNX:no exudate, no erythema and lips, buccal mucosa, and tongue normal  NECK: supple, thyroid normal size, non-tender, without nodularity LYMPH:  no palpable lymphadenopathy in the cervical, axillary or inguinal LUNGS: clear to auscultation and percussion with normal breathing effort HEART: regular rate & rhythm and no murmurs and no lower extremity edema ABDOMEN:abdomen soft, non-tender and normal bowel sounds Musculoskeletal:no cyanosis of digits and no clubbing   NEURO: alert & oriented x 3 with fluent speech, no focal motor/sensory deficits  LABORATORY DATA:  I have reviewed the data as listed    Component Value Date/Time   NA 143 07/22/2016 1003   K 4.4 07/22/2016 1003   CL 102 01/17/2016 0910   CO2 25 07/22/2016 1003   GLUCOSE 99 07/22/2016 1003   BUN 21.4 07/22/2016 1003   CREATININE 0.7 07/22/2016 1003   CALCIUM 8.8 07/22/2016 1003   PROT 6.3 (L) 07/22/2016 1003   ALBUMIN 3.4 (L) 07/22/2016 1003   AST 20 07/22/2016 1003   ALT 21 07/22/2016 1003   ALKPHOS 47 07/22/2016 1003   BILITOT 0.61 07/22/2016 1003   GFRNONAA 64 01/17/2016 0910   GFRAA 74 01/17/2016 0910    No results found for: SPEP, UPEP  Lab Results  Component Value Date   WBC 6.4 07/22/2016   NEUTROABS 4.5 07/22/2016   HGB 12.5 07/22/2016   HCT 38.0 07/22/2016   MCV 90.5 07/22/2016   PLT 156 07/22/2016      Chemistry      Component Value Date/Time   NA 143 07/22/2016 1003   K 4.4 07/22/2016 1003   CL 102 01/17/2016 0910   CO2 25 07/22/2016 1003   BUN 21.4 07/22/2016 1003   CREATININE 0.7 07/22/2016 1003      Component Value Date/Time   CALCIUM 8.8 07/22/2016 1003   ALKPHOS 47 07/22/2016 1003   AST 20 07/22/2016 1003   ALT 21 07/22/2016 1003   BILITOT 0.61 07/22/2016 1003      ASSESSMENT & PLAN:  Multiple myeloma in remission ([place]) I reviewed recent blood work with the patient and family members. She has a remarkable response with near complete remission after only 3 cycles of treatment. Due to neutropenia, I recommend discontinuation of Revlimid and I plan to transition her to maintenance therapy. She has completed that Revlimid treatment and will start Velcade maintenance today She will continue acyclovir for antimicrobial prophylaxis She will take calcium with vitamin D supplement. Her next Zometa dose will be due in October I plan to redraw myeloma panel again in 2 weeks She is doing better with daily dose of 4 mg dexamethasone with  resolution of pain and is gaining weight. I recommend we reduce the dose of dexamethasone to 2 mg daily and we'll reassess next month  Protein-calorie malnutrition, mild ([place]) She has gained a lot of weight since I initiated dexamethasone treatment I recommend reducing the dose of dexamethasone to 2 mg daily and recheck her weight in 1 month Will consult dietitian  Cancer associated pain Her pain is mildly improved since we begun treatment and added daily dexamethasone She will continue to take the medication as needed for pain.   Orders Placed This Encounter  Procedures  . Kappa/lambda light chains    Standing Status:   Future    Standing Expiration Date:   08/26/2017  . Multiple Myeloma Panel (SPEP&IFE w/QIG)    Standing Status:   Future    Standing Expiration Date:   08/26/2017   All questions were answered. The patient knows to call the clinic with any problems, questions or concerns. No barriers to learning was detected. I spent 15 minutes counseling  the patient face to face. The total time spent in the appointment was 20 minutes and more than 50% was on counseling and review of test results     [facility], [place], MD 07/22/2016 10:54 AM

## 2016-07-22 NOTE — Assessment & Plan Note (Signed)
She has gained a lot of weight since I initiated dexamethasone treatment I recommend reducing the dose of dexamethasone to 2 mg daily and recheck her weight in 1 month Will consult dietitian

## 2016-07-22 NOTE — Assessment & Plan Note (Signed)
I reviewed recent blood work with the patient and family members. She has a remarkable response with near complete remission after only 3 cycles of treatment. Due to neutropenia, I recommend discontinuation of Revlimid and I plan to transition her to maintenance therapy. She has completed that Revlimid treatment and will start Velcade maintenance today She will continue acyclovir for antimicrobial prophylaxis She will take calcium with vitamin D supplement. Her next Zometa dose will be due in October I plan to redraw myeloma panel again in 2 weeks She is doing better with daily dose of 4 mg dexamethasone with resolution of pain and is gaining weight. I recommend we reduce the dose of dexamethasone to 2 mg daily and we'll reassess next month

## 2016-07-22 NOTE — Progress Notes (Signed)
79 year old female diagnosed with multiple myeloma.  She is a patient of Dr. Alvy Bimler.  Past medical history includes hypertension and DDD.  Medications include Decadron.  Labs include albumin 3.4 on August 22.  Height: 5 feet 3 inches. Weight: 133.9 pounds. Usual body weight: 140 pounds. BMI: 23.72.  I met with both patient and her sister in infusion. Patient reports she is eating better than she was now that she is taking steroids. She likes ensure and tries to drink 1 bottle daily. Patient enjoys most protein containing foods. Patient denies nutrition impact symptoms.  Nutrition diagnosis:  Unintended weight loss related to poor appetite as evidenced by 6 pound weight loss from usual body weight.  Intervention: I educated patient on strategies for improving appetite. Encouraged small frequent meals and snacks consuming high-protein foods 6 times daily. Recommended patient drink ensure plus or boost plus twice a day between meals. Provided fact sheets and coupons. Gave patient my contact information and answered questions and completed teach back teaching.  Monitoring, evaluation, goals:  Patient will tolerate increased calories and protein to minimize weight loss.  Next visit: Patient will contact me for questions or concerns.  **Disclaimer: This note was dictated with voice recognition software. Similar sounding words can inadvertently be transcribed and this note may contain transcription errors which may not have been corrected upon publication of note.**

## 2016-07-22 NOTE — Patient Instructions (Signed)
[facility] Discharge Instructions for Patients Receiving Chemotherapy  Today you received the following chemotherapy agents:  Velcade.  To help prevent nausea and vomiting after your treatment, we encourage you to take your nausea medication as prescrobed.   If you develop nausea and vomiting that is not controlled by your nausea medication, call the clinic.   BELOW ARE SYMPTOMS THAT SHOULD BE REPORTED IMMEDIATELY:  *FEVER GREATER THAN 100.5 F  *CHILLS WITH OR WITHOUT FEVER  NAUSEA AND VOMITING THAT IS NOT CONTROLLED WITH YOUR NAUSEA MEDICATION  *UNUSUAL SHORTNESS OF BREATH  *UNUSUAL BRUISING OR BLEEDING  TENDERNESS IN MOUTH AND THROAT WITH OR WITHOUT PRESENCE OF ULCERS  *URINARY PROBLEMS  *BOWEL PROBLEMS  UNUSUAL RASH Items with * indicate a potential emergency and should be followed up as soon as possible.  Feel free to call the clinic you have any questions or concerns. The clinic phone number is [phone].  Please show the [facility] at check-in to the Emergency Department and triage nurse.

## 2016-07-22 NOTE — Telephone Encounter (Signed)
Gave relative avs report and appointments for September and October  °

## 2016-07-22 NOTE — Assessment & Plan Note (Signed)
Her pain is mildly improved since we begun treatment and added daily dexamethasone She will continue to take the medication as needed for pain.

## 2016-08-05 ENCOUNTER — Ambulatory Visit (HOSPITAL_COMMUNITY)
Admission: RE | Admit: 2016-08-05 | Discharge: 2016-08-05 | Disposition: A | Discharge status: Home / Self Care | Payer: Commercial Managed Care - HMO | Source: Ambulatory Visit | Attending: Nurse Practitioner | Admitting: Nurse Practitioner

## 2016-08-05 ENCOUNTER — Ambulatory Visit (HOSPITAL_BASED_OUTPATIENT_CLINIC_OR_DEPARTMENT_OTHER): Payer: Commercial Managed Care - HMO

## 2016-08-05 ENCOUNTER — Ambulatory Visit (HOSPITAL_BASED_OUTPATIENT_CLINIC_OR_DEPARTMENT_OTHER): Payer: Commercial Managed Care - HMO | Admitting: Nurse Practitioner

## 2016-08-05 ENCOUNTER — Other Ambulatory Visit (HOSPITAL_BASED_OUTPATIENT_CLINIC_OR_DEPARTMENT_OTHER): Payer: Commercial Managed Care - HMO

## 2016-08-05 ENCOUNTER — Other Ambulatory Visit: Payer: Self-pay | Admitting: *Deleted

## 2016-08-05 VITALS — BP 152/73 | HR 85 | Temp 98.3°F | Resp 18

## 2016-08-05 DIAGNOSIS — C9001 Multiple myeloma in remission: Secondary | ICD-10-CM

## 2016-08-05 DIAGNOSIS — R609 Edema, unspecified: Secondary | ICD-10-CM | POA: Diagnosis not present

## 2016-08-05 DIAGNOSIS — Z5112 Encounter for antineoplastic immunotherapy: Secondary | ICD-10-CM

## 2016-08-05 DIAGNOSIS — M48061 Spinal stenosis, lumbar region without neurogenic claudication: Secondary | ICD-10-CM

## 2016-08-05 LAB — COMPREHENSIVE METABOLIC PANEL
ALT: 24 U/L (ref 0–55)
AST: 23 U/L (ref 5–34)
Albumin: 3.4 g/dL — ABNORMAL LOW (ref 3.5–5.0)
Alkaline Phosphatase: 58 U/L (ref 40–150)
Anion Gap: 10 mEq/L (ref 3–11)
BUN: 21.2 mg/dL (ref 7.0–26.0)
CHLORIDE: 110 meq/L — AB (ref 98–109)
CO2: 25 meq/L (ref 22–29)
Calcium: 9.2 mg/dL (ref 8.4–10.4)
Creatinine: 0.8 mg/dL (ref 0.6–1.1)
EGFR: 74 mL/min/{1.73_m2} — ABNORMAL LOW (ref >90)
GLUCOSE: 111 mg/dL (ref 70–140)
POTASSIUM: 4.2 meq/L (ref 3.5–5.1)
SODIUM: 145 meq/L (ref 136–145)
Total Bilirubin: 0.47 mg/dL (ref 0.20–1.20)
Total Protein: 6.6 g/dL (ref 6.4–8.3)

## 2016-08-05 LAB — CBC WITH DIFFERENTIAL/PLATELET
BASO%: 0.1 % (ref 0.0–2.0)
BASOS ABS: 0 10*3/uL (ref 0.0–0.1)
EOS%: 0 % (ref 0.0–7.0)
Eosinophils Absolute: 0 10*3/uL (ref 0.0–0.5)
HCT: 37.8 % (ref 34.8–46.6)
HEMOGLOBIN: 12.6 g/dL (ref 11.6–15.9)
LYMPH%: 13.2 % — ABNORMAL LOW (ref 14.0–49.7)
MCH: 30.8 pg (ref 25.1–34.0)
MCHC: 33.3 g/dL (ref 31.5–36.0)
MCV: 92.4 fL (ref 79.5–101.0)
MONO#: 0.4 10*3/uL (ref 0.1–0.9)
MONO%: 6 % (ref 0.0–14.0)
NEUT#: 5.7 10*3/uL (ref 1.5–6.5)
NEUT%: 80.7 % — AB (ref 38.4–76.8)
Platelets: 210 10*3/uL (ref 145–400)
RBC: 4.09 10*6/uL (ref 3.70–5.45)
RDW: 17.4 % — AB (ref 11.2–14.5)
WBC: 7 10*3/uL (ref 3.9–10.3)
lymph#: 0.9 10*3/uL (ref 0.9–3.3)

## 2016-08-05 MED ORDER — OXYCODONE-ACETAMINOPHEN 5-325 MG PO TABS: 1.0000 | ORAL_TABLET | Freq: Four times a day (QID) | ORAL | 0 refills | Status: DC | PRN

## 2016-08-05 MED ORDER — PROCHLORPERAZINE MALEATE 10 MG PO TABS
10.0000 mg | ORAL_TABLET | Freq: Once | ORAL | Status: AC
  Administered 2016-08-05: 10 mg via ORAL

## 2016-08-05 MED ORDER — BORTEZOMIB CHEMO SQ INJECTION 3.5 MG (2.5MG/ML)
0.9750 mg/m2 | Freq: Once | INTRAMUSCULAR | Status: AC
  Administered 2016-08-05: 1.5 mg via SUBCUTANEOUS
  Filled 2016-08-05: qty 1.5

## 2016-08-05 MED ORDER — PROCHLORPERAZINE MALEATE 10 MG PO TABS
ORAL_TABLET | ORAL | Status: AC
  Filled 2016-08-05: qty 1

## 2016-08-05 NOTE — Patient Instructions (Signed)
[facility] Discharge Instructions for Patients Receiving Chemotherapy  Today you received the following chemotherapy agents: Velcade  To help prevent nausea and vomiting after your treatment, we encourage you to take your nausea medications as directed. If you develop nausea and vomiting that is not controlled by your nausea medication, call the clinic.   BELOW ARE SYMPTOMS THAT SHOULD BE REPORTED IMMEDIATELY:  *FEVER GREATER THAN 100.5 F  *CHILLS WITH OR WITHOUT FEVER  NAUSEA AND VOMITING THAT IS NOT CONTROLLED WITH YOUR NAUSEA MEDICATION  *UNUSUAL SHORTNESS OF BREATH  *UNUSUAL BRUISING OR BLEEDING  TENDERNESS IN MOUTH AND THROAT WITH OR WITHOUT PRESENCE OF ULCERS  *URINARY PROBLEMS  *BOWEL PROBLEMS  UNUSUAL RASH Items with * indicate a potential emergency and should be followed up as soon as possible.  Feel free to call the clinic you have any questions or concerns. The clinic phone number is [phone].  Please show the [facility] at check-in to the Emergency Department and triage nurse.

## 2016-08-05 NOTE — Progress Notes (Signed)
Pt presented for infusion appointment, RN noticed right arm was edematous, errythemia, warm to touch, pt stated she noticed the change in appearance this am. Pt denied pain or tenderness. Dr Alvy Bimler and Charolotte Eke were notified and pt was examined. VSS, 2+ pitting edema right leg and 1+ left leg. Meds taken by pt today: Zovirax, Decadron 20mg , Lisinopril 40mg  and Vitamin D. Plan is for pt to have DVT scan done today. Pt and her sister, verbalized understanding. Will continue to monitor.

## 2016-08-05 NOTE — Progress Notes (Signed)
*  Preliminary Results* Right upper extremity venous duplex completed. Right upper extremity is negative for deep and superficial vein thrombosis.  Right lower extremity venous duplex completed. Right lower extremity is negative for deep vein thrombosis. There is no evidence of right Baker's cyst.  Preliminary results discussed with Drue Second.  08/05/2016 5:15 PM  Maudry Mayhew, BS, RVT, RDCS, RDMS

## 2016-08-06 ENCOUNTER — Encounter: Payer: Self-pay | Admitting: Nurse Practitioner

## 2016-08-06 DIAGNOSIS — R609 Edema, unspecified: Secondary | ICD-10-CM | POA: Insufficient documentation

## 2016-08-06 DIAGNOSIS — R600 Localized edema: Secondary | ICD-10-CM | POA: Insufficient documentation

## 2016-08-06 LAB — KAPPA/LAMBDA LIGHT CHAINS
IG KAPPA FREE LIGHT CHAIN: 116.5 mg/L — AB (ref 3.3–19.4)
IG LAMBDA FREE LIGHT CHAIN: 5.4 mg/L — AB (ref 5.7–26.3)
Kappa/Lambda FluidC Ratio: 21.57 — ABNORMAL HIGH (ref 0.26–1.65)

## 2016-08-06 NOTE — Assessment & Plan Note (Signed)
Patient states that she always has some mild edema to bilateral lower extremities; but has noted mild increased edema to right leg in the past few days.  She denies any calf tenderness or other issues with her leg.  Also, she is noted to have increased edema to the right upper extremity today as well.  She denies any other new symptoms whatsoever.  She denies any pain.  Exam today reveals edema to bilateral lower extremities; with the right greater than the left.  Also, the right arm is edematous as well.  There is no erythema, warmth, or red streaks.  Patient underwent a Doppler ultrasound of both the right upper and lower extremities today; which was negative for DVT.  Patient advised to elevate her right arm and right leg above the level of for heart when she is at rest and she may also try some cool compresses as well.  She was advised to call/return or go directly to the emergency department for any worsening symptoms whatsoever.

## 2016-08-06 NOTE — Assessment & Plan Note (Signed)
Patient presented to the [facility] today to receive cycle 6 of Velcade injections.  Scheduled for return for labs, visit, and her next Velcade injection on 08/19/2016.

## 2016-08-06 NOTE — Progress Notes (Signed)
SYMPTOM MANAGEMENT CLINIC    Chief Complaint: Edema  HPI:  Kimberly Friedman 80 y.o. female diagnosed with multiple myeloma; and is currently undergoing Velcade injections.     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  All other systems reviewed and are negative.   Past Medical History:  Diagnosis Date  . DDD (degenerative disc disease), lumbar   . Hypertension   . Multiple myeloma ([place]) 01/21/2016  . PMR (polymyalgia rheumatica) ([facility])     Past Surgical History:  Procedure Laterality Date  . HEMORRHOID SURGERY      has Bilateral leg weakness; Difficulty in walking(719.7); PMR (polymyalgia rheumatica) ([facility]); DDD (degenerative disc disease), lumbar; Hypertension; Multiple myeloma in remission ([place]); Hypotension due to drugs; Cancer associated pain; Malignant cachexia ([place]); Acquired pancytopenia ([place]); Anemia due to antineoplastic chemotherapy; Protein-calorie malnutrition, mild ([place]); Bilateral leg edema; Drug-induced neutropenia ([place]); Memory deficits; and Peripheral edema on her problem list.    is allergic to pravastatin and zocor [simvastatin].    Medication List       Accurate as of 08/05/16 11:59 PM. Always use your most recent med list.          acyclovir 400 MG tablet Commonly known as:  ZOVIRAX Take 1 tablet (400 mg  total) by mouth daily.   cyclobenzaprine 5 MG tablet Commonly known as:  FLEXERIL Take 1 tablet (5 mg total) by mouth 2 (two) times daily as needed for muscle spasms (muscle cramps).   dexamethasone 4 MG tablet Commonly known as:  DECADRON Take 1 tablet (4 mg total) by mouth daily.   lisinopril 40 MG tablet Commonly known as:  PRINIVIL,ZESTRIL Take 1 tablet (40 mg total) by mouth daily.   oxyCODONE-acetaminophen 5-325 MG tablet Commonly known as:  ROXICET Take 1 tablet by mouth every 6 (six) hours as needed for severe pain.        PHYSICAL EXAMINATION  Oncology Vitals 08/05/2016 07/22/2016  Height - -  Weight - 60.737 kg  Weight (lbs) - 133 lbs 14 oz  BMI (kg/m2) - 23.72 kg/m2  Temp 98.3 97.8  Pulse 85 65  Resp 18 18  SpO2 96 100  BSA (m2) - 1.64 m2   BP Readings from Last 2 Encounters:  08/05/16 (!) 152/73  07/22/16 (!) 142/73    Physical Exam  Constitutional: She is oriented to person, place, and time and well-developed, well-nourished, and in no distress.  HENT:  Head: Normocephalic and atraumatic.  Eyes: Conjunctivae and EOM are normal. Pupils are equal, round, and reactive to light.  Neck: Normal range of motion.  Pulmonary/Chest: Effort normal. No respiratory distress.  Musculoskeletal: Normal range of motion. She exhibits edema. She exhibits no tenderness or deformity.  Patient has increased edema  to both her right upper and lower extremity.  Neurological: She is alert and oriented to person, place, and time.  Skin: Skin is warm and dry. No erythema.  Psychiatric: Affect normal.  Nursing note and vitals reviewed.   LABORATORY DATA:. Appointment on 08/05/2016  Component Date Value Ref Range Status  . Ig Kappa Free Light Chain 08/06/2016 116.5* 3.3 - 19.4 mg/L Final  . Ig Lambda Free Light Chain 08/06/2016 5.4* 5.7 - 26.3 mg/L Final  . Kappa/Lambda FluidC Ratio 08/06/2016 21.57* 0.26 - 1.65 Final  . WBC 08/05/2016 7.0  3.9 - 10.3 10e3/uL Final  . NEUT#  08/05/2016 5.7  1.5 - 6.5 10e3/uL Final  . HGB 08/05/2016 12.6  11.6 - 15.9 g/dL Final  . HCT 08/05/2016 37.8  34.8 - 46.6 % Final  . Platelets 08/05/2016 210  145 - 400 10e3/uL Final  . MCV 08/05/2016 92.4  79.5 - 101.0 fL Final  . MCH 08/05/2016 30.8  25.1 - 34.0 pg Final  . MCHC 08/05/2016 33.3  31.5 - 36.0 g/dL Final  . RBC 08/05/2016 4.09  3.70 - 5.45 10e6/uL Final  . RDW 08/05/2016 17.4* 11.2 - 14.5 % Final  . lymph# 08/05/2016 0.9  0.9 - 3.3 10e3/uL Final  . MONO# 08/05/2016 0.4  0.1 - 0.9 10e3/uL Final  . Eosinophils Absolute 08/05/2016 0.0  0.0 - 0.5 10e3/uL Final  . Basophils Absolute 08/05/2016 0.0  0.0 - 0.1 10e3/uL Final  . NEUT% 08/05/2016 80.7* 38.4 - 76.8 % Final  . LYMPH% 08/05/2016 13.2* 14.0 - 49.7 % Final  . MONO% 08/05/2016 6.0  0.0 - 14.0 % Final  . EOS% 08/05/2016 0.0  0.0 - 7.0 % Final  . BASO% 08/05/2016 0.1  0.0 - 2.0 % Final  . Sodium 08/05/2016 145  136 - 145 mEq/L Final  . Potassium 08/05/2016 4.2  3.5 - 5.1 mEq/L Final  . Chloride 08/05/2016 110* 98 - 109 mEq/L Final  . CO2 08/05/2016 25  22 - 29 mEq/L Final  . Glucose 08/05/2016 111  70 - 140 mg/dl Final  . BUN 08/05/2016 21.2  7.0 - 26.0 mg/dL Final  . Creatinine 08/05/2016 0.8  0.6 - 1.1 mg/dL Final  . Total Bilirubin 08/05/2016 0.47  0.20 - 1.20 mg/dL Final  . Alkaline Phosphatase 08/05/2016 58  40 - 150 U/L Final  . AST 08/05/2016 23  5 - 34 U/L Final  . ALT 08/05/2016 24  0 - 55 U/L Final  . Total Protein 08/05/2016 6.6  6.4 - 8.3 g/dL Final  . Albumin 08/05/2016 3.4* 3.5 - 5.0 g/dL Final  . Calcium 08/05/2016 9.2  8.4 - 10.4 mg/dL Final  . Anion Gap 08/05/2016 10  3 - 11 mEq/L Final  . EGFR 08/05/2016 74* >90 ml/min/1.73 m2 Final    RADIOGRAPHIC STUDIES: No results found.  ASSESSMENT/PLAN:    Peripheral edema Patient states that she always has some mild edema to bilateral lower extremities; but has noted mild increased edema to right leg in the past few days.  She denies any calf  tenderness or other issues with her leg.  Also, she is noted to have increased edema to the right upper extremity today as well.  She denies any other new symptoms whatsoever.  She denies any pain.  Exam today reveals edema to bilateral lower extremities; with the right greater than the left.  Also, the right arm is edematous as well.  There is no erythema, warmth, or red streaks.  Patient underwent a  Doppler ultrasound of both the right upper and lower extremities today; which was negative for DVT.  Patient advised to elevate her right arm and right leg above the level of for heart when she is at rest and she may also try some cool compresses as well.  She was advised to call/return or go directly to the emergency department for any worsening symptoms whatsoever.  Multiple myeloma in remission [facility]) Patient presented to the [facility] today to receive cycle 6 of Velcade injections.  Scheduled for return for labs, visit, and her next Velcade injection on 08/19/2016.   Patient stated understanding of all instructions; and was in agreement with this plan of care. The patient knows to call the clinic with any problems, questions or concerns.   Total time spent with patient was 15 minutes;  with greater than 85 percent of that time spent in face to face counseling regarding patient's symptoms,  and coordination of care and follow up.  Disclaimer:This dictation was prepared with Dragon/digital dictation along with [employer]. Any transcriptional errors that result from this process are unintentional.  Drue Second, NP 08/06/2016

## 2016-08-07 ENCOUNTER — Telehealth: Payer: Self-pay | Admitting: *Deleted

## 2016-08-07 NOTE — Telephone Encounter (Signed)
TC call to patient and to pt's sister to follow up on how pt is doing.  No answer @ pt's home #. LVM message on sister, Kimberly Friedman's cell phone to return call. She did not had blood clot in arm or leg per doppler.  Received TC call back from pt's sister, Kimberly Friedman. Ms. Kimberly Friedman states she is doing much better and the swelling and bruising in her arm has improved a great deal. No other concerns or questions at this time.

## 2016-08-08 LAB — MULTIPLE MYELOMA PANEL, SERUM
ALBUMIN SERPL ELPH-MCNC: 3.5 g/dL (ref 2.9–4.4)
ALPHA 1: 0.2 g/dL (ref 0.0–0.4)
ALPHA2 GLOB SERPL ELPH-MCNC: 0.8 g/dL (ref 0.4–1.0)
Albumin/Glob SerPl: 1.5 (ref 0.7–1.7)
B-GLOBULIN SERPL ELPH-MCNC: 1.1 g/dL (ref 0.7–1.3)
GAMMA GLOB SERPL ELPH-MCNC: 0.4 g/dL (ref 0.4–1.8)
GLOBULIN, TOTAL: 2.5 g/dL (ref 2.2–3.9)
IGG (IMMUNOGLOBIN G), SERUM: 406 mg/dL — AB (ref 700–1600)
IgA, Qn, Serum: 243 mg/dL (ref 64–422)
IgM, Qn, Serum: 13 mg/dL — ABNORMAL LOW (ref 26–217)
M PROTEIN SERPL ELPH-MCNC: 0.4 g/dL — AB
TOTAL PROTEIN: 6 g/dL (ref 6.0–8.5)

## 2016-08-18 ENCOUNTER — Other Ambulatory Visit: Payer: Self-pay | Admitting: *Deleted

## 2016-08-19 ENCOUNTER — Other Ambulatory Visit (HOSPITAL_BASED_OUTPATIENT_CLINIC_OR_DEPARTMENT_OTHER): Payer: Commercial Managed Care - HMO

## 2016-08-19 ENCOUNTER — Other Ambulatory Visit: Payer: Self-pay | Admitting: Hematology and Oncology

## 2016-08-19 ENCOUNTER — Encounter: Payer: Self-pay | Admitting: Hematology and Oncology

## 2016-08-19 ENCOUNTER — Telehealth: Payer: Self-pay | Admitting: Hematology and Oncology

## 2016-08-19 ENCOUNTER — Ambulatory Visit (HOSPITAL_BASED_OUTPATIENT_CLINIC_OR_DEPARTMENT_OTHER): Payer: Commercial Managed Care - HMO | Admitting: Hematology and Oncology

## 2016-08-19 ENCOUNTER — Ambulatory Visit (HOSPITAL_BASED_OUTPATIENT_CLINIC_OR_DEPARTMENT_OTHER): Payer: Commercial Managed Care - HMO

## 2016-08-19 VITALS — BP 135/68 | HR 69 | Temp 97.5°F | Resp 17 | Ht 63.0 in | Wt 140.2 lb

## 2016-08-19 DIAGNOSIS — Z23 Encounter for immunization: Secondary | ICD-10-CM | POA: Diagnosis not present

## 2016-08-19 DIAGNOSIS — Z5112 Encounter for antineoplastic immunotherapy: Secondary | ICD-10-CM

## 2016-08-19 DIAGNOSIS — C9001 Multiple myeloma in remission: Secondary | ICD-10-CM

## 2016-08-19 DIAGNOSIS — R600 Localized edema: Secondary | ICD-10-CM | POA: Diagnosis not present

## 2016-08-19 DIAGNOSIS — Z7189 Other specified counseling: Secondary | ICD-10-CM | POA: Insufficient documentation

## 2016-08-19 DIAGNOSIS — R413 Other amnesia: Secondary | ICD-10-CM

## 2016-08-19 LAB — CBC WITH DIFFERENTIAL/PLATELET
BASO%: 1.3 % (ref 0.0–2.0)
BASOS ABS: 0.1 10*3/uL (ref 0.0–0.1)
EOS ABS: 0.1 10*3/uL (ref 0.0–0.5)
EOS%: 1 % (ref 0.0–7.0)
HCT: 39.3 % (ref 34.8–46.6)
HEMOGLOBIN: 12.9 g/dL (ref 11.6–15.9)
LYMPH%: 20.6 % (ref 14.0–49.7)
MCH: 30.7 pg (ref 25.1–34.0)
MCHC: 32.8 g/dL (ref 31.5–36.0)
MCV: 93.5 fL (ref 79.5–101.0)
MONO#: 0.6 10*3/uL (ref 0.1–0.9)
MONO%: 7.8 % (ref 0.0–14.0)
NEUT%: 69.3 % (ref 38.4–76.8)
NEUTROS ABS: 5.1 10*3/uL (ref 1.5–6.5)
PLATELETS: 209 10*3/uL (ref 145–400)
RBC: 4.2 10*6/uL (ref 3.70–5.45)
RDW: 17.4 % — AB (ref 11.2–14.5)
WBC: 7.4 10*3/uL (ref 3.9–10.3)
lymph#: 1.5 10*3/uL (ref 0.9–3.3)

## 2016-08-19 LAB — COMPREHENSIVE METABOLIC PANEL
ALBUMIN: 3.5 g/dL (ref 3.5–5.0)
ALK PHOS: 57 U/L (ref 40–150)
ALT: 22 U/L (ref 0–55)
ANION GAP: 10 meq/L (ref 3–11)
AST: 19 U/L (ref 5–34)
BUN: 16.5 mg/dL (ref 7.0–26.0)
CALCIUM: 9 mg/dL (ref 8.4–10.4)
CHLORIDE: 106 meq/L (ref 98–109)
CO2: 25 mEq/L (ref 22–29)
CREATININE: 0.7 mg/dL (ref 0.6–1.1)
EGFR: 83 mL/min/{1.73_m2} — ABNORMAL LOW (ref >90)
Glucose: 95 mg/dl (ref 70–140)
Potassium: 3.7 mEq/L (ref 3.5–5.1)
Sodium: 141 mEq/L (ref 136–145)
Total Bilirubin: 0.78 mg/dL (ref 0.20–1.20)
Total Protein: 6.6 g/dL (ref 6.4–8.3)

## 2016-08-19 MED ORDER — BORTEZOMIB CHEMO SQ INJECTION 3.5 MG (2.5MG/ML)
0.9750 mg/m2 | Freq: Once | INTRAMUSCULAR | Status: AC
  Administered 2016-08-19: 1.5 mg via SUBCUTANEOUS
  Filled 2016-08-19: qty 1.5

## 2016-08-19 MED ORDER — PROCHLORPERAZINE MALEATE 10 MG PO TABS
10.0000 mg | ORAL_TABLET | Freq: Once | ORAL | Status: AC
  Administered 2016-08-19: 10 mg via ORAL

## 2016-08-19 MED ORDER — INFLUENZA VAC SPLIT QUAD 0.5 ML IM SUSY
0.5000 mL | PREFILLED_SYRINGE | Freq: Once | INTRAMUSCULAR | Status: AC
  Administered 2016-08-19: 0.5 mL via INTRAMUSCULAR
  Filled 2016-08-19: qty 0.5

## 2016-08-19 MED ORDER — PROCHLORPERAZINE MALEATE 10 MG PO TABS
ORAL_TABLET | ORAL | Status: AC
  Filled 2016-08-19: qty 1

## 2016-08-19 NOTE — Assessment & Plan Note (Addendum)
I review her recent myeloma panel with her 2 sisters. There are signs of early relapse since we transitioned her treatment to maintenance therapy every other week. We discussed extensively about goals of care as outlined below. For now, she has no signs of organ damage and I recommend we continue the same treatment every other week along with low-dose dexamethasone daily for bone pain and appetite stimulant

## 2016-08-19 NOTE — Assessment & Plan Note (Signed)
She has poor memory and signs of dementia. I do not believe she is capable of making her own medical decision and I defer to her sister to make decision for her.

## 2016-08-19 NOTE — Progress Notes (Signed)
Ok to give Velcade today, prior Josem Kaufmann is approved per Arbie Cookey in pharmacy.

## 2016-08-19 NOTE — Progress Notes (Signed)
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 She returns today with her sister. She feels well. Denies bone pain. She continues to have fluid retention with bilateral lower extremity edema. She denies worsening bone pain  REVIEW OF SYSTEMS:   Constitutional: Denies fevers, chills or abnormal weight loss Eyes: Denies blurriness of vision Ears, nose, mouth, throat, and face: Denies mucositis or sore throat Respiratory: Denies cough, dyspnea or wheezes Cardiovascular: Denies palpitation, chest discomfort  Gastrointestinal:  Denies nausea, heartburn or change in bowel habits Skin: Denies abnormal skin rashes Lymphatics: Denies new lymphadenopathy or easy bruising Neurological:Denies numbness, tingling or new weaknesses Behavioral/Psych: Mood is stable, no new changes  All other systems were reviewed with the patient and are negative.  I have reviewed the past medical history, past surgical history, social history and family history with the patient and they are unchanged from previous note.  ALLERGIES:  is allergic to pravastatin and zocor [simvastatin].  MEDICATIONS:  Current  Outpatient Prescriptions  Medication Sig Dispense Refill  . acyclovir (ZOVIRAX) 400 MG tablet Take 1 tablet (400 mg total) by mouth daily. 90 tablet 3  . cyclobenzaprine (FLEXERIL) 5 MG tablet Take 1 tablet (5 mg total) by mouth 2 (two) times daily as needed for muscle spasms (muscle cramps). 60 tablet 0  . dexamethasone (DECADRON) 4 MG tablet Take 1 tablet (4 mg total) by mouth daily. 60 tablet 1  . lisinopril (PRINIVIL,ZESTRIL) 40 MG tablet Take 1 tablet (40 mg total) by mouth daily. 30 tablet 11  . oxyCODONE-acetaminophen (ROXICET) 5-325 MG tablet Take 1 tablet by mouth every 6 (six) hours as needed for severe pain. 90 tablet 0   No current facility-administered medications for this visit.     PHYSICAL EXAMINATION: ECOG PERFORMANCE STATUS: 1 - Symptomatic but completely ambulatory  Vitals:   08/19/16 1032  BP: 135/68  Pulse: 69  Resp: 17  Temp: 97.5 F (36.4 C)   Filed Weights   08/19/16 1032  Weight: 140 lb 3.2 oz (63.6 kg)    GENERAL:alert, no distress and comfortable SKIN: skin color, texture, turgor are normal, no rashes or significant lesions EYES: normal, Conjunctiva are pink and non-injected, sclera clear HEART: regular rate & rhythm and no murmurs with moderate lower extremity edema ABDOMEN:abdomen soft, non-tender and normal bowel sounds Musculoskeletal:no cyanosis of digits and no clubbing  NEURO: alert & oriented x 3 with fluent speech, no focal motor/sensory deficits  LABORATORY DATA:  I have reviewed the data as listed    Component Value Date/Time   NA 141 08/19/2016 1021   K 3.7  08/19/2016 1021   CL 102 01/17/2016 0910   CO2 25 08/19/2016 1021   GLUCOSE 95 08/19/2016 1021   BUN 16.5 08/19/2016 1021   CREATININE 0.7 08/19/2016 1021   CALCIUM 9.0 08/19/2016 1021   PROT 6.6 08/19/2016 1021   ALBUMIN 3.5 08/19/2016 1021   AST 19 08/19/2016 1021   ALT 22 08/19/2016 1021   ALKPHOS 57 08/19/2016 1021   BILITOT 0.78 08/19/2016 1021   GFRNONAA 64  01/17/2016 0910   GFRAA 74 01/17/2016 0910    No results found for: SPEP, UPEP  Lab Results  Component Value Date   WBC 7.4 08/19/2016   NEUTROABS 5.1 08/19/2016   HGB 12.9 08/19/2016   HCT 39.3 08/19/2016   MCV 93.5 08/19/2016   PLT 209 08/19/2016      Chemistry      Component Value Date/Time   NA 141 08/19/2016 1021   K 3.7 08/19/2016 1021   CL 102 01/17/2016 0910   CO2 25 08/19/2016 1021   BUN 16.5 08/19/2016 1021   CREATININE 0.7 08/19/2016 1021      Component Value Date/Time   CALCIUM 9.0 08/19/2016 1021   ALKPHOS 57 08/19/2016 1021   AST 19 08/19/2016 1021   ALT 22 08/19/2016 1021   BILITOT 0.78 08/19/2016 1021       ASSESSMENT & PLAN:  Multiple myeloma in remission ([place]) I review her recent myeloma panel with her 2 sisters. There are signs of early relapse since we transitioned her treatment to maintenance therapy every other week. We discussed extensively about goals of care as outlined below. For now, she has no signs of organ damage and I recommend we continue the same treatment every other week along with low-dose dexamethasone daily for bone pain and appetite stimulant   Bilateral leg edema This is related to fluid retention and protein calorie malnutrition She is currently on low-dose dexamethasone and I recommend she continues same dose Recent ultrasound venous Doppler excluded DVT  Memory deficits She has poor memory and signs of dementia. I do not believe she is capable of making her own medical decision and I defer to her sister to make decision for her.  Goals of care, counseling/discussion We discussed goals of care. For now, the patient feels well. I suspect if she continues to have disease progression, we might have to place her back on aggressive treatment. With her age, comorbidities and dementia,I need the family members to commit to some form of goals of care. We discussed the importance of advanced directives and appointment of  dedicated medical healthcare power of attorney. I give her some information today and I recommend we discussed further goals of care in the next visit   Orders Placed This Encounter  Procedures  . Kappa/lambda light chains    Standing Status:   Future    Standing Expiration Date:   09/23/2017  . Multiple Myeloma Panel (SPEP&IFE w/QIG)    Standing Status:   Future    Standing Expiration Date:   09/23/2017   All questions were answered. The patient knows to call the clinic with any problems, questions or concerns. No barriers to learning was detected. I spent 30 minutes counseling the patient face to face. The total time spent in the appointment was 40 minutes and more than 50% was on counseling and review of test results     Heath Lark, MD 08/19/2016 2:16 PM

## 2016-08-19 NOTE — Assessment & Plan Note (Signed)
This is related to fluid retention and protein calorie malnutrition She is currently on low-dose dexamethasone and I recommend she continues same dose Recent ultrasound venous Doppler excluded DVT

## 2016-08-19 NOTE — Patient Instructions (Signed)
[facility] Discharge Instructions for Patients Receiving Chemotherapy  Today you received the following chemotherapy agents Velcade. To help prevent nausea and vomiting after your treatment, we encourage you to take your nausea medication as directed.  If you develop nausea and vomiting that is not controlled by your nausea medication, call the clinic.   BELOW ARE SYMPTOMS THAT SHOULD BE REPORTED IMMEDIATELY:  *FEVER GREATER THAN 100.5 F  *CHILLS WITH OR WITHOUT FEVER  NAUSEA AND VOMITING THAT IS NOT CONTROLLED WITH YOUR NAUSEA MEDICATION  *UNUSUAL SHORTNESS OF BREATH  *UNUSUAL BRUISING OR BLEEDING  TENDERNESS IN MOUTH AND THROAT WITH OR WITHOUT PRESENCE OF ULCERS  *URINARY PROBLEMS  *BOWEL PROBLEMS  UNUSUAL RASH Items with * indicate a potential emergency and should be followed up as soon as possible.  Feel free to call the clinic you have any questions or concerns. The clinic phone number is [phone].  Please show the CHEMO ALERT CARD at check-in to the Emergency Department and triage nurse.    

## 2016-08-19 NOTE — Telephone Encounter (Signed)
Gv pt appts for Oct.

## 2016-08-19 NOTE — Assessment & Plan Note (Signed)
We discussed goals of care. For now, the patient feels well. I suspect if she continues to have disease progression, we might have to place her back on aggressive treatment. With her age, comorbidities and dementia,I need the family members to commit to some form of goals of care. We discussed the importance of advanced directives and appointment of dedicated medical healthcare power of attorney. I give her some information today and I recommend we discussed further goals of care in the next visit

## 2016-09-01 ENCOUNTER — Telehealth: Payer: Self-pay | Admitting: Family Medicine

## 2016-09-01 NOTE — Telephone Encounter (Signed)
Pt needs renew [facility] referral for Oncologist Dr Alvy Bimler.  Approved # [id number] for 6 visit s from 09/02/16 - 4/15/118  Dx C90.01

## 2016-09-02 ENCOUNTER — Ambulatory Visit (HOSPITAL_BASED_OUTPATIENT_CLINIC_OR_DEPARTMENT_OTHER): Payer: Commercial Managed Care - HMO

## 2016-09-02 ENCOUNTER — Other Ambulatory Visit (HOSPITAL_BASED_OUTPATIENT_CLINIC_OR_DEPARTMENT_OTHER): Payer: Commercial Managed Care - HMO

## 2016-09-02 VITALS — BP 161/72 | HR 72 | Temp 98.2°F | Resp 17

## 2016-09-02 DIAGNOSIS — C9001 Multiple myeloma in remission: Secondary | ICD-10-CM

## 2016-09-02 DIAGNOSIS — Z5112 Encounter for antineoplastic immunotherapy: Secondary | ICD-10-CM

## 2016-09-02 DIAGNOSIS — C9000 Multiple myeloma not having achieved remission: Secondary | ICD-10-CM

## 2016-09-02 LAB — COMPREHENSIVE METABOLIC PANEL
ALT: 17 U/L (ref 0–55)
ANION GAP: 10 meq/L (ref 3–11)
AST: 18 U/L (ref 5–34)
Albumin: 3.5 g/dL (ref 3.5–5.0)
Alkaline Phosphatase: 54 U/L (ref 40–150)
BUN: 19.6 mg/dL (ref 7.0–26.0)
CALCIUM: 8.9 mg/dL (ref 8.4–10.4)
CHLORIDE: 107 meq/L (ref 98–109)
CO2: 25 mEq/L (ref 22–29)
Creatinine: 0.7 mg/dL (ref 0.6–1.1)
EGFR: 82 mL/min/{1.73_m2} — ABNORMAL LOW (ref >90)
Glucose: 96 mg/dl (ref 70–140)
POTASSIUM: 3.8 meq/L (ref 3.5–5.1)
Sodium: 142 mEq/L (ref 136–145)
Total Bilirubin: 0.51 mg/dL (ref 0.20–1.20)
Total Protein: 6.6 g/dL (ref 6.4–8.3)

## 2016-09-02 LAB — CBC WITH DIFFERENTIAL/PLATELET
BASO%: 0.4 % (ref 0.0–2.0)
BASOS ABS: 0 10*3/uL (ref 0.0–0.1)
EOS ABS: 0.1 10*3/uL (ref 0.0–0.5)
EOS%: 1.2 % (ref 0.0–7.0)
HEMATOCRIT: 39 % (ref 34.8–46.6)
HGB: 12.7 g/dL (ref 11.6–15.9)
LYMPH#: 1.9 10*3/uL (ref 0.9–3.3)
LYMPH%: 27.7 % (ref 14.0–49.7)
MCH: 31.1 pg (ref 25.1–34.0)
MCHC: 32.6 g/dL (ref 31.5–36.0)
MCV: 95.4 fL (ref 79.5–101.0)
MONO#: 0.6 10*3/uL (ref 0.1–0.9)
MONO%: 9.4 % (ref 0.0–14.0)
NEUT#: 4.2 10*3/uL (ref 1.5–6.5)
NEUT%: 61.3 % (ref 38.4–76.8)
PLATELETS: 208 10*3/uL (ref 145–400)
RBC: 4.09 10*6/uL (ref 3.70–5.45)
RDW: 16.1 % — ABNORMAL HIGH (ref 11.2–14.5)
WBC: 6.8 10*3/uL (ref 3.9–10.3)

## 2016-09-02 MED ORDER — PROCHLORPERAZINE MALEATE 10 MG PO TABS
10.0000 mg | ORAL_TABLET | Freq: Once | ORAL | Status: AC
  Administered 2016-09-02: 10 mg via ORAL

## 2016-09-02 MED ORDER — PROCHLORPERAZINE MALEATE 10 MG PO TABS
ORAL_TABLET | ORAL | Status: AC
  Filled 2016-09-02: qty 1

## 2016-09-02 MED ORDER — ZOLEDRONIC ACID 4 MG/5ML IV CONC
3.5000 mg | Freq: Once | INTRAVENOUS | Status: AC
  Administered 2016-09-02: 3.5 mg via INTRAVENOUS
  Filled 2016-09-02: qty 4.38

## 2016-09-02 MED ORDER — BORTEZOMIB CHEMO SQ INJECTION 3.5 MG (2.5MG/ML)
0.9750 mg/m2 | Freq: Once | INTRAMUSCULAR | Status: AC
  Administered 2016-09-02: 1.5 mg via SUBCUTANEOUS
  Filled 2016-09-02: qty 1.5

## 2016-09-02 MED ORDER — SODIUM CHLORIDE 0.9 % IV SOLN
Freq: Once | INTRAVENOUS | Status: AC
  Administered 2016-09-02: 11:00:00 via INTRAVENOUS

## 2016-09-02 NOTE — Patient Instructions (Signed)
[facility] Discharge Instructions for Patients Receiving Chemotherapy  Today you received the following chemotherapy agents:  Velcade  To help prevent nausea and vomiting after your treatment, we encourage you to take your nausea medication as prescribed.   If you develop nausea and vomiting that is not controlled by your nausea medication, call the clinic.   BELOW ARE SYMPTOMS THAT SHOULD BE REPORTED IMMEDIATELY:  *FEVER GREATER THAN 100.5 F  *CHILLS WITH OR WITHOUT FEVER  NAUSEA AND VOMITING THAT IS NOT CONTROLLED WITH YOUR NAUSEA MEDICATION  *UNUSUAL SHORTNESS OF BREATH  *UNUSUAL BRUISING OR BLEEDING  TENDERNESS IN MOUTH AND THROAT WITH OR WITHOUT PRESENCE OF ULCERS  *URINARY PROBLEMS  *BOWEL PROBLEMS  UNUSUAL RASH Items with * indicate a potential emergency and should be followed up as soon as possible.  Feel free to call the clinic you have any questions or concerns. The clinic phone number is [phone].  Please show the [place] at check-in to the Emergency Department and triage nurse.  Zoledronic Acid injection (Hypercalcemia, Oncology) What is this medicine? ZOLEDRONIC ACID (ZOE le dron ik AS id) lowers the amount of calcium loss from bone. It is used to treat too much calcium in your blood from cancer. It is also used to prevent complications of cancer that has spread to the bone. This medicine may be used for other purposes; ask your health care provider or pharmacist if you have questions. What should I tell my health care provider before I take this medicine? They need to know if you have any of these conditions: -aspirin-sensitive asthma -cancer, especially if you are receiving medicines used to treat cancer -dental disease or wear dentures -infection -kidney disease -receiving corticosteroids like dexamethasone or prednisone -an unusual or allergic reaction to zoledronic acid, other medicines, foods, dyes, or  preservatives -pregnant or trying to get pregnant -breast-feeding How should I use this medicine? This medicine is for infusion into a vein. It is given by a health care professional in a hospital or clinic setting. Talk to your pediatrician regarding the use of this medicine in children. Special care may be needed. Overdosage: If you think you have taken too much of this medicine contact a poison control center or emergency room at once. NOTE: This medicine is only for you. Do not share this medicine with others. What if I miss a dose? It is important not to miss your dose. Call your doctor or health care professional if you are unable to keep an appointment. What may interact with this medicine? -certain antibiotics given by injection -NSAIDs, medicines for pain and inflammation, like ibuprofen or naproxen -some diuretics like bumetanide, furosemide -teriparatide -thalidomide This list may not describe all possible interactions. Give your health care provider a list of all the medicines, herbs, non-prescription drugs, or dietary supplements you use. Also tell them if you smoke, drink alcohol, or use illegal drugs. Some items may interact with your medicine. What should I watch for while using this medicine? Visit your doctor or health care professional for regular checkups. It may be some time before you see the benefit from this medicine. Do not stop taking your medicine unless your doctor tells you to. Your doctor may order blood tests or other tests to see how you are doing. Women should inform their doctor if they wish to become pregnant or think they might be pregnant. There is a potential for serious side effects to an unborn child. Talk to your health care professional  or pharmacist for more information. You should make sure that you get enough calcium and vitamin D while you are taking this medicine. Discuss the foods you eat and the vitamins you take with your health care  professional. Some people who take this medicine have severe bone, joint, and/or muscle pain. This medicine may also increase your risk for jaw problems or a broken thigh bone. Tell your doctor right away if you have severe pain in your jaw, bones, joints, or muscles. Tell your doctor if you have any pain that does not go away or that gets worse. Tell your dentist and dental surgeon that you are taking this medicine. You should not have major dental surgery while on this medicine. See your dentist to have a dental exam and fix any dental problems before starting this medicine. Take good care of your teeth while on this medicine. Make sure you see your dentist for regular follow-up appointments. What side effects may I notice from receiving this medicine? Side effects that you should report to your doctor or health care professional as soon as possible: -allergic reactions like skin rash, itching or hives, swelling of the face, lips, or tongue -anxiety, confusion, or depression -breathing problems -changes in vision -eye pain -feeling faint or lightheaded, falls -jaw pain, especially after dental work -mouth sores -muscle cramps, stiffness, or weakness -redness, blistering, peeling or loosening of the skin, including inside the mouth -trouble passing urine or change in the amount of urine Side effects that usually do not require medical attention (report to your doctor or health care professional if they continue or are bothersome): -bone, joint, or muscle pain -constipation -diarrhea -fever -hair loss -irritation at site where injected -loss of appetite -nausea, vomiting -stomach upset -trouble sleeping -trouble swallowing -weak or tired This list may not describe all possible side effects. Call your doctor for medical advice about side effects. You may report side effects to FDA at 1-800-FDA-1088. Where should I keep my medicine? This drug is given in a hospital or clinic and will not  be stored at home. NOTE: This sheet is a summary. It may not cover all possible information. If you have questions about this medicine, talk to your doctor, pharmacist, or health care provider.    2016, [employer]vier/[employer]. (2014-04-01 14:19:39)

## 2016-09-03 LAB — KAPPA/LAMBDA LIGHT CHAINS
Ig Kappa Free Light Chain: 234.6 mg/L — ABNORMAL HIGH (ref 3.3–19.4)
Ig Lambda Free Light Chain: 6.5 mg/L (ref 5.7–26.3)
KAPPA/LAMBDA FLC RATIO: 36.09 — AB (ref 0.26–1.65)

## 2016-09-05 LAB — MULTIPLE MYELOMA PANEL, SERUM
ALBUMIN SERPL ELPH-MCNC: 3.7 g/dL (ref 2.9–4.4)
ALPHA 1: 0.2 g/dL (ref 0.0–0.4)
Albumin/Glob SerPl: 1.6 (ref 0.7–1.7)
Alpha2 Glob SerPl Elph-Mcnc: 0.8 g/dL (ref 0.4–1.0)
B-GLOBULIN SERPL ELPH-MCNC: 1.1 g/dL (ref 0.7–1.3)
GAMMA GLOB SERPL ELPH-MCNC: 0.3 g/dL — AB (ref 0.4–1.8)
GLOBULIN, TOTAL: 2.4 g/dL (ref 2.2–3.9)
IGA/IMMUNOGLOBULIN A, SERUM: 305 mg/dL (ref 64–422)
IgG, Qn, Serum: 420 mg/dL — ABNORMAL LOW (ref 700–1600)
IgM, Qn, Serum: 13 mg/dL — ABNORMAL LOW (ref 26–217)
M PROTEIN SERPL ELPH-MCNC: 0.2 g/dL — AB
Total Protein: 6.1 g/dL (ref 6.0–8.5)

## 2016-09-10 ENCOUNTER — Encounter: Payer: Self-pay | Admitting: *Deleted

## 2016-09-10 NOTE — Progress Notes (Signed)
QI encounter 

## 2016-09-16 ENCOUNTER — Other Ambulatory Visit: Payer: Self-pay | Admitting: *Deleted

## 2016-09-16 ENCOUNTER — Ambulatory Visit (HOSPITAL_BASED_OUTPATIENT_CLINIC_OR_DEPARTMENT_OTHER): Payer: Commercial Managed Care - HMO | Admitting: Hematology and Oncology

## 2016-09-16 ENCOUNTER — Telehealth: Payer: Self-pay | Admitting: Hematology and Oncology

## 2016-09-16 ENCOUNTER — Other Ambulatory Visit: Payer: Commercial Managed Care - HMO

## 2016-09-16 ENCOUNTER — Other Ambulatory Visit (HOSPITAL_BASED_OUTPATIENT_CLINIC_OR_DEPARTMENT_OTHER): Payer: Commercial Managed Care - HMO

## 2016-09-16 ENCOUNTER — Ambulatory Visit: Payer: Commercial Managed Care - HMO | Admitting: Hematology

## 2016-09-16 ENCOUNTER — Ambulatory Visit (HOSPITAL_BASED_OUTPATIENT_CLINIC_OR_DEPARTMENT_OTHER): Payer: Commercial Managed Care - HMO

## 2016-09-16 ENCOUNTER — Encounter: Payer: Self-pay | Admitting: Hematology and Oncology

## 2016-09-16 VITALS — BP 159/85 | HR 65 | Temp 97.5°F | Resp 18 | Ht 63.0 in | Wt 141.1 lb

## 2016-09-16 DIAGNOSIS — I10 Essential (primary) hypertension: Secondary | ICD-10-CM

## 2016-09-16 DIAGNOSIS — R413 Other amnesia: Secondary | ICD-10-CM

## 2016-09-16 DIAGNOSIS — Z5112 Encounter for antineoplastic immunotherapy: Secondary | ICD-10-CM | POA: Diagnosis not present

## 2016-09-16 DIAGNOSIS — R64 Cachexia: Secondary | ICD-10-CM

## 2016-09-16 DIAGNOSIS — C9001 Multiple myeloma in remission: Secondary | ICD-10-CM

## 2016-09-16 DIAGNOSIS — G893 Neoplasm related pain (acute) (chronic): Secondary | ICD-10-CM

## 2016-09-16 DIAGNOSIS — Z7189 Other specified counseling: Secondary | ICD-10-CM

## 2016-09-16 LAB — CBC WITH DIFFERENTIAL/PLATELET
BASO%: 0.3 % (ref 0.0–2.0)
BASOS ABS: 0 10*3/uL (ref 0.0–0.1)
EOS ABS: 0 10*3/uL (ref 0.0–0.5)
EOS%: 0.4 % (ref 0.0–7.0)
HEMATOCRIT: 38.4 % (ref 34.8–46.6)
HGB: 12.5 g/dL (ref 11.6–15.9)
LYMPH#: 1.9 10*3/uL (ref 0.9–3.3)
LYMPH%: 25.4 % (ref 14.0–49.7)
MCH: 31.5 pg (ref 25.1–34.0)
MCHC: 32.6 g/dL (ref 31.5–36.0)
MCV: 96.7 fL (ref 79.5–101.0)
MONO#: 0.8 10*3/uL (ref 0.1–0.9)
MONO%: 10 % (ref 0.0–14.0)
NEUT#: 4.9 10*3/uL (ref 1.5–6.5)
NEUT%: 63.9 % (ref 38.4–76.8)
PLATELETS: 213 10*3/uL (ref 145–400)
RBC: 3.97 10*6/uL (ref 3.70–5.45)
RDW: 15.8 % — ABNORMAL HIGH (ref 11.2–14.5)
WBC: 7.6 10*3/uL (ref 3.9–10.3)

## 2016-09-16 LAB — COMPREHENSIVE METABOLIC PANEL
ALT: 19 U/L (ref 0–55)
ANION GAP: 8 meq/L (ref 3–11)
AST: 18 U/L (ref 5–34)
Albumin: 3.5 g/dL (ref 3.5–5.0)
Alkaline Phosphatase: 52 U/L (ref 40–150)
BUN: 20.2 mg/dL (ref 7.0–26.0)
CALCIUM: 9 mg/dL (ref 8.4–10.4)
CHLORIDE: 113 meq/L — AB (ref 98–109)
CO2: 25 mEq/L (ref 22–29)
CREATININE: 0.7 mg/dL (ref 0.6–1.1)
EGFR: 80 mL/min/{1.73_m2} — AB (ref >90)
Glucose: 89 mg/dl (ref 70–140)
POTASSIUM: 4.2 meq/L (ref 3.5–5.1)
Sodium: 146 mEq/L — ABNORMAL HIGH (ref 136–145)
Total Bilirubin: 0.43 mg/dL (ref 0.20–1.20)
Total Protein: 6.8 g/dL (ref 6.4–8.3)

## 2016-09-16 MED ORDER — PROCHLORPERAZINE MALEATE 10 MG PO TABS
ORAL_TABLET | ORAL | Status: AC
  Filled 2016-09-16: qty 1

## 2016-09-16 MED ORDER — LENALIDOMIDE 5 MG PO CAPS: 5.0000 mg | ORAL_CAPSULE | Freq: Every day | ORAL | 0 refills | Status: DC

## 2016-09-16 MED ORDER — LISINOPRIL 40 MG PO TABS: 40.0000 mg | ORAL_TABLET | Freq: Every day | ORAL | 11 refills | Status: DC

## 2016-09-16 MED ORDER — BORTEZOMIB CHEMO SQ INJECTION 3.5 MG (2.5MG/ML)
0.9750 mg/m2 | Freq: Once | INTRAMUSCULAR | Status: AC
  Administered 2016-09-16: 1.5 mg via SUBCUTANEOUS
  Filled 2016-09-16: qty 1.5

## 2016-09-16 MED ORDER — PROCHLORPERAZINE MALEATE 10 MG PO TABS
10.0000 mg | ORAL_TABLET | Freq: Once | ORAL | Status: AC
  Administered 2016-09-16: 10 mg via ORAL

## 2016-09-16 MED FILL — LISINOPRIL 40 MG TABLET: 40 | 30 days supply | Qty: 30 | Fill #0

## 2016-09-16 NOTE — Assessment & Plan Note (Signed)
Her pain is mildly improved since we begun treatment and added daily dexamethasone She will continue to take the medication as needed for pain.

## 2016-09-16 NOTE — Progress Notes (Signed)
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 She returns today for further follow-up. She feels well. Denies peripheral neuropathy. Her weight has stabilized on daily dexamethasone. She denies significant pain. She has minor skin irritation from Velcade injection. Denies recent infection. Her 2 sisters are here to help her make medical decisions regarding her care  REVIEW OF SYSTEMS:   Constitutional: Denies fevers, chills or abnormal weight loss Eyes: Denies blurriness of vision Ears, nose, mouth, throat, and face: Denies mucositis or sore throat Respiratory: Denies cough, dyspnea or wheezes Cardiovascular: Denies palpitation, chest discomfort or lower extremity swelling Gastrointestinal:  Denies nausea, heartburn or change in bowel habits Skin: Denies abnormal skin rashes Lymphatics: Denies new lymphadenopathy or easy bruising Neurological:Denies numbness, tingling or new weaknesses Behavioral/Psych: Mood is stable, no new changes  All other systems were reviewed with the patient and are negative.  I have reviewed the past medical history, past surgical history, social history  and family history with the patient and they are unchanged from previous note.  ALLERGIES:  is allergic to pravastatin and zocor [simvastatin].  MEDICATIONS:  Current Outpatient Prescriptions  Medication Sig Dispense Refill  . acyclovir (ZOVIRAX) 400 MG tablet Take 1 tablet (400 mg total) by mouth daily. 90 tablet 3  . dexamethasone (DECADRON) 4 MG tablet Take 1 tablet (4 mg total) by mouth daily. (Patient taking differently: Take 2 mg by mouth daily. ) 60 tablet 1  . lisinopril (PRINIVIL,ZESTRIL) 40 MG tablet Take 1 tablet (40 mg total) by mouth daily. 30 tablet 11  . oxyCODONE-acetaminophen (ROXICET) 5-325 MG tablet Take 1 tablet by mouth every 6 (six) hours as needed for severe pain. 90 tablet 0  . lenalidomide (REVLIMID) 5 MG capsule Take 1 capsule (5 mg total) by mouth daily. 21 capsule 0   No current facility-administered medications for this visit.     PHYSICAL EXAMINATION: ECOG PERFORMANCE STATUS: 0 - Asymptomatic  Vitals:   09/16/16 0828  BP: (!) 159/85  Pulse: 65  Resp: 18  Temp: 97.5 F (36.4 C)   Filed Weights   09/16/16 0828  Weight: 141 lb 1.6 oz (64 kg)    GENERAL:alert, no distress and comfortable SKIN: skin color, texture, turgor are normal, no rashes or significant lesions EYES: normal, Conjunctiva are pink and non-injected, sclera clear OROPHARYNX:no exudate, no erythema and lips, buccal mucosa, and tongue normal  NECK: supple, thyroid normal size, non-tender, without nodularity LYMPH:  no palpable lymphadenopathy in the cervical, axillary or inguinal LUNGS: clear to auscultation and percussion with normal breathing effort HEART: regular rate & rhythm and  no murmurs and no lower extremity edema ABDOMEN:abdomen soft, non-tender and normal bowel sounds Musculoskeletal:no cyanosis of digits and no clubbing  NEURO: alert & oriented x 3 with fluent speech, no focal motor/sensory deficits  LABORATORY DATA:  I have reviewed the data as listed    Component  Value Date/Time   NA 146 (H) 09/16/2016 0810   K 4.2 09/16/2016 0810   CL 102 01/17/2016 0910   CO2 25 09/16/2016 0810   GLUCOSE 89 09/16/2016 0810   BUN 20.2 09/16/2016 0810   CREATININE 0.7 09/16/2016 0810   CALCIUM 9.0 09/16/2016 0810   PROT 6.8 09/16/2016 0810   ALBUMIN 3.5 09/16/2016 0810   AST 18 09/16/2016 0810   ALT 19 09/16/2016 0810   ALKPHOS 52 09/16/2016 0810   BILITOT 0.43 09/16/2016 0810   GFRNONAA 64 01/17/2016 0910   GFRAA 74 01/17/2016 0910    No results found for: SPEP, UPEP  Lab Results  Component Value Date   WBC 7.6 09/16/2016   NEUTROABS 4.9 09/16/2016   HGB 12.5 09/16/2016   HCT 38.4 09/16/2016   MCV 96.7 09/16/2016   PLT 213 09/16/2016      Chemistry      Component Value Date/Time   NA 146 (H) 09/16/2016 0810   K 4.2 09/16/2016 0810   CL 102 01/17/2016 0910   CO2 25 09/16/2016 0810   BUN 20.2 09/16/2016 0810   CREATININE 0.7 09/16/2016 0810      Component Value Date/Time   CALCIUM 9.0 09/16/2016 0810   ALKPHOS 52 09/16/2016 0810   AST 18 09/16/2016 0810   ALT 19 09/16/2016 0810   BILITOT 0.43 09/16/2016 0810      ASSESSMENT & PLAN:  Multiple myeloma in remission ([place]) I review her recent myeloma panel with her 2 sisters. There are signs of early relapse since we transitioned her treatment to maintenance therapy every other week. We discussed extensively about goals of care as outlined below. For now, she has no signs of organ damage  We discussed options She tolerated Revlimid well in the past. Ultimately, we decided to continue on Velcade every other week injection and plan to restart her on Revlimid, 5 mg, 21 days on, 7 days off along with monthly Zometa. She will continue acyclovir, dexamethasone, calcium, vitamin D and resume aspirin I will see her back in 4 weeks for toxicity review  Memory deficits She has poor memory and signs of dementia. I do not believe she is capable of making her own medical decision and I defer to  her sister to make decision for her.  Malignant cachexia ([place]) She has gained a lot of weight since I initiated dexamethasone treatment I recommend reducing the dose of dexamethasone to 2 mg daily and recheck her weight in 1 month  Goals of care, counseling/discussion We discussed goals of care. For now, the patient feels well. We discussed the importance of advanced directives and appointment of dedicated medical healthcare power of attorney. Her sister brought paperwork signed for dedicated medical power of attorney For now, they want her to remain on aggressive treatment  Cancer associated pain Her pain is mildly improved since we begun treatment and added daily dexamethasone She will continue to take the medication as needed for pain.  Essential hypertension she will continue current medical management. I recommend close follow-up with primary care doctor for medication adjustment. I refilled her prescription today per patient request   No orders of the defined types were placed in this encounter.  All questions were answered. The patient knows to call the clinic with any problems, questions or concerns. No barriers to learning was detected. I spent 25 minutes counseling the patient face to face. The total time spent in the appointment was 40 minutes and more than 50% was on counseling and review of test results     Heath Lark, MD 09/16/2016 8:53 AM

## 2016-09-16 NOTE — Assessment & Plan Note (Signed)
We discussed goals of care. For now, the patient feels well. We discussed the importance of advanced directives and appointment of dedicated medical healthcare power of attorney. Her sister brought paperwork signed for dedicated medical power of attorney For now, they want her to remain on aggressive treatment 

## 2016-09-16 NOTE — Assessment & Plan Note (Signed)
I review her recent myeloma panel with her 2 sisters. There are signs of early relapse since we transitioned her treatment to maintenance therapy every other week. We discussed extensively about goals of care as outlined below. For now, she has no signs of organ damage  We discussed options She tolerated Revlimid well in the past. Ultimately, we decided to continue on Velcade every other week injection and plan to restart her on Revlimid, 5 mg, 21 days on, 7 days off along with monthly Zometa. She will continue acyclovir, dexamethasone, calcium, vitamin D and resume aspirin I will see her back in 4 weeks for toxicity review

## 2016-09-16 NOTE — Assessment & Plan Note (Signed)
she will continue current medical management. I recommend close follow-up with primary care doctor for medication adjustment. I refilled her prescription today per patient request

## 2016-09-16 NOTE — Assessment & Plan Note (Signed)
She has gained a lot of weight since I initiated dexamethasone treatment I recommend reducing the dose of dexamethasone to 2 mg daily and recheck her weight in 1 month

## 2016-09-16 NOTE — Assessment & Plan Note (Signed)
She has poor memory and signs of dementia. I do not believe she is capable of making her own medical decision and I defer to her sister to make decision for her.

## 2016-09-16 NOTE — Patient Instructions (Signed)
[facility] Discharge Instructions for Patients Receiving Chemotherapy  Today you received the following chemotherapy agents Velcade. To help prevent nausea and vomiting after your treatment, we encourage you to take your nausea medication as directed.  If you develop nausea and vomiting that is not controlled by your nausea medication, call the clinic.   BELOW ARE SYMPTOMS THAT SHOULD BE REPORTED IMMEDIATELY:  *FEVER GREATER THAN 100.5 F  *CHILLS WITH OR WITHOUT FEVER  NAUSEA AND VOMITING THAT IS NOT CONTROLLED WITH YOUR NAUSEA MEDICATION  *UNUSUAL SHORTNESS OF BREATH  *UNUSUAL BRUISING OR BLEEDING  TENDERNESS IN MOUTH AND THROAT WITH OR WITHOUT PRESENCE OF ULCERS  *URINARY PROBLEMS  *BOWEL PROBLEMS  UNUSUAL RASH Items with * indicate a potential emergency and should be followed up as soon as possible.  Feel free to call the clinic you have any questions or concerns. The clinic phone number is [phone].  Please show the CHEMO ALERT CARD at check-in to the Emergency Department and triage nurse.    

## 2016-09-16 NOTE — Telephone Encounter (Signed)
Appointments scheduled per 10/31 LOS. AVS report and calendars of future scheduled appointments given to patient.

## 2016-09-17 ENCOUNTER — Telehealth: Payer: Self-pay | Admitting: *Deleted

## 2016-09-17 NOTE — Telephone Encounter (Signed)
Per LOS the scheduled appts and notified scheduler

## 2016-09-30 ENCOUNTER — Other Ambulatory Visit (HOSPITAL_BASED_OUTPATIENT_CLINIC_OR_DEPARTMENT_OTHER): Payer: Commercial Managed Care - HMO

## 2016-09-30 ENCOUNTER — Other Ambulatory Visit: Payer: Self-pay | Admitting: *Deleted

## 2016-09-30 ENCOUNTER — Ambulatory Visit (HOSPITAL_BASED_OUTPATIENT_CLINIC_OR_DEPARTMENT_OTHER): Payer: Commercial Managed Care - HMO

## 2016-09-30 VITALS — BP 146/69 | HR 70 | Temp 97.8°F | Resp 18

## 2016-09-30 DIAGNOSIS — Z5112 Encounter for antineoplastic immunotherapy: Secondary | ICD-10-CM

## 2016-09-30 DIAGNOSIS — M48061 Spinal stenosis, lumbar region without neurogenic claudication: Secondary | ICD-10-CM

## 2016-09-30 DIAGNOSIS — C9001 Multiple myeloma in remission: Secondary | ICD-10-CM

## 2016-09-30 LAB — CBC WITH DIFFERENTIAL/PLATELET
BASO%: 0.7 % (ref 0.0–2.0)
Basophils Absolute: 0 10*3/uL (ref 0.0–0.1)
EOS ABS: 0.1 10*3/uL (ref 0.0–0.5)
EOS%: 1.2 % (ref 0.0–7.0)
HCT: 40.4 % (ref 34.8–46.6)
HEMOGLOBIN: 13.1 g/dL (ref 11.6–15.9)
LYMPH%: 37 % (ref 14.0–49.7)
MCH: 31.4 pg (ref 25.1–34.0)
MCHC: 32.4 g/dL (ref 31.5–36.0)
MCV: 96.9 fL (ref 79.5–101.0)
MONO#: 0.4 10*3/uL (ref 0.1–0.9)
MONO%: 7.4 % (ref 0.0–14.0)
NEUT%: 53.7 % (ref 38.4–76.8)
NEUTROS ABS: 3.1 10*3/uL (ref 1.5–6.5)
Platelets: 184 10*3/uL (ref 145–400)
RBC: 4.17 10*6/uL (ref 3.70–5.45)
RDW: 15.6 % — AB (ref 11.2–14.5)
WBC: 5.7 10*3/uL (ref 3.9–10.3)
lymph#: 2.1 10*3/uL (ref 0.9–3.3)

## 2016-09-30 LAB — COMPREHENSIVE METABOLIC PANEL
ALBUMIN: 3.6 g/dL (ref 3.5–5.0)
ALK PHOS: 51 U/L (ref 40–150)
ALT: 15 U/L (ref 0–55)
AST: 17 U/L (ref 5–34)
Anion Gap: 10 mEq/L (ref 3–11)
BILIRUBIN TOTAL: 0.7 mg/dL (ref 0.20–1.20)
BUN: 19.9 mg/dL (ref 7.0–26.0)
CO2: 24 mEq/L (ref 22–29)
Calcium: 9.1 mg/dL (ref 8.4–10.4)
Chloride: 110 mEq/L — ABNORMAL HIGH (ref 98–109)
Creatinine: 0.8 mg/dL (ref 0.6–1.1)
EGFR: 75 mL/min/{1.73_m2} — ABNORMAL LOW (ref >90)
GLUCOSE: 100 mg/dL (ref 70–140)
Potassium: 3.8 mEq/L (ref 3.5–5.1)
SODIUM: 144 meq/L (ref 136–145)
TOTAL PROTEIN: 6.8 g/dL (ref 6.4–8.3)

## 2016-09-30 MED ORDER — PROCHLORPERAZINE MALEATE 10 MG PO TABS
10.0000 mg | ORAL_TABLET | Freq: Once | ORAL | Status: AC
  Administered 2016-09-30: 10 mg via ORAL

## 2016-09-30 MED ORDER — BORTEZOMIB CHEMO SQ INJECTION 3.5 MG (2.5MG/ML)
0.9750 mg/m2 | Freq: Once | INTRAMUSCULAR | Status: AC
  Administered 2016-09-30: 1.5 mg via SUBCUTANEOUS
  Filled 2016-09-30: qty 1.5

## 2016-09-30 MED ORDER — PROCHLORPERAZINE MALEATE 10 MG PO TABS
ORAL_TABLET | ORAL | Status: AC
  Filled 2016-09-30: qty 1

## 2016-09-30 MED ORDER — DEXAMETHASONE 4 MG PO TABS: 4.0000 mg | ORAL_TABLET | Freq: Every day | ORAL | 1 refills | Status: DC

## 2016-09-30 MED ORDER — OXYCODONE-ACETAMINOPHEN 5-325 MG PO TABS: 1.0000 | ORAL_TABLET | Freq: Four times a day (QID) | ORAL | 0 refills | Status: DC | PRN

## 2016-09-30 MED FILL — DEXAMETHASONE 4 MG TABLET: 4 | 60 days supply | Qty: 60 | Fill #0

## 2016-09-30 MED FILL — OXYCODONE/APAP 5/325 MG TAB: 5-325 | 23 days supply | Qty: 90 | Fill #0

## 2016-09-30 NOTE — Patient Instructions (Signed)
[facility] Discharge Instructions for Patients Receiving Chemotherapy  Today you received the following chemotherapy agents Velcade. To help prevent nausea and vomiting after your treatment, we encourage you to take your nausea medication as directed.  If you develop nausea and vomiting that is not controlled by your nausea medication, call the clinic.   BELOW ARE SYMPTOMS THAT SHOULD BE REPORTED IMMEDIATELY:  *FEVER GREATER THAN 100.5 F  *CHILLS WITH OR WITHOUT FEVER  NAUSEA AND VOMITING THAT IS NOT CONTROLLED WITH YOUR NAUSEA MEDICATION  *UNUSUAL SHORTNESS OF BREATH  *UNUSUAL BRUISING OR BLEEDING  TENDERNESS IN MOUTH AND THROAT WITH OR WITHOUT PRESENCE OF ULCERS  *URINARY PROBLEMS  *BOWEL PROBLEMS  UNUSUAL RASH Items with * indicate a potential emergency and should be followed up as soon as possible.  Feel free to call the clinic you have any questions or concerns. The clinic phone number is [phone].  Please show the CHEMO ALERT CARD at check-in to the Emergency Department and triage nurse.    

## 2016-10-06 ENCOUNTER — Emergency Department (HOSPITAL_COMMUNITY)
Admission: EM | Admit: 2016-10-06 | Discharge: 2016-10-06 | Disposition: A | Discharge status: Home / Self Care | Payer: Commercial Managed Care - HMO | Attending: Emergency Medicine | Admitting: Emergency Medicine

## 2016-10-06 ENCOUNTER — Other Ambulatory Visit: Payer: Self-pay

## 2016-10-06 ENCOUNTER — Encounter (HOSPITAL_COMMUNITY): Payer: Self-pay

## 2016-10-06 ENCOUNTER — Emergency Department (HOSPITAL_COMMUNITY): Payer: Commercial Managed Care - HMO

## 2016-10-06 DIAGNOSIS — R0789 Other chest pain: Secondary | ICD-10-CM | POA: Insufficient documentation

## 2016-10-06 DIAGNOSIS — X58XXXA Exposure to other specified factors, initial encounter: Secondary | ICD-10-CM | POA: Insufficient documentation

## 2016-10-06 DIAGNOSIS — C9000 Multiple myeloma not having achieved remission: Secondary | ICD-10-CM | POA: Diagnosis not present

## 2016-10-06 DIAGNOSIS — S43001A Unspecified subluxation of right shoulder joint, initial encounter: Secondary | ICD-10-CM

## 2016-10-06 DIAGNOSIS — I10 Essential (primary) hypertension: Secondary | ICD-10-CM | POA: Insufficient documentation

## 2016-10-06 DIAGNOSIS — Z79899 Other long term (current) drug therapy: Secondary | ICD-10-CM | POA: Insufficient documentation

## 2016-10-06 DIAGNOSIS — Y929 Unspecified place or not applicable: Secondary | ICD-10-CM | POA: Insufficient documentation

## 2016-10-06 DIAGNOSIS — Y939 Activity, unspecified: Secondary | ICD-10-CM | POA: Diagnosis not present

## 2016-10-06 DIAGNOSIS — Y999 Unspecified external cause status: Secondary | ICD-10-CM | POA: Diagnosis not present

## 2016-10-06 DIAGNOSIS — R079 Chest pain, unspecified: Secondary | ICD-10-CM | POA: Diagnosis not present

## 2016-10-06 DIAGNOSIS — S4991XA Unspecified injury of right shoulder and upper arm, initial encounter: Secondary | ICD-10-CM | POA: Diagnosis present

## 2016-10-06 DIAGNOSIS — S43004A Unspecified dislocation of right shoulder joint, initial encounter: Secondary | ICD-10-CM | POA: Diagnosis not present

## 2016-10-06 LAB — CBC WITH DIFFERENTIAL/PLATELET
BASOS PCT: 0 %
Basophils Absolute: 0 10*3/uL (ref 0.0–0.1)
EOS ABS: 0.1 10*3/uL (ref 0.0–0.7)
EOS PCT: 2 %
HCT: 38.9 % (ref 36.0–46.0)
HEMOGLOBIN: 12.6 g/dL (ref 12.0–15.0)
Lymphocytes Relative: 30 %
Lymphs Abs: 1.8 10*3/uL (ref 0.7–4.0)
MCH: 31.6 pg (ref 26.0–34.0)
MCHC: 32.4 g/dL (ref 30.0–36.0)
MCV: 97.5 fL (ref 78.0–100.0)
MONO ABS: 0.4 10*3/uL (ref 0.1–1.0)
MONOS PCT: 7 %
NEUTROS PCT: 61 %
Neutro Abs: 3.7 10*3/uL (ref 1.7–7.7)
PLATELETS: 172 10*3/uL (ref 150–400)
RBC: 3.99 MIL/uL (ref 3.87–5.11)
RDW: 15.3 % (ref 11.5–15.5)
WBC: 6 10*3/uL (ref 4.0–10.5)

## 2016-10-06 LAB — BASIC METABOLIC PANEL
Anion gap: 7 (ref 5–15)
BUN: 18 mg/dL (ref 6–20)
CALCIUM: 8.7 mg/dL — AB (ref 8.9–10.3)
CO2: 25 mmol/L (ref 22–32)
CREATININE: 0.61 mg/dL (ref 0.44–1.00)
Chloride: 107 mmol/L (ref 101–111)
Glucose, Bld: 104 mg/dL — ABNORMAL HIGH (ref 65–99)
Potassium: 3.8 mmol/L (ref 3.5–5.1)
SODIUM: 139 mmol/L (ref 135–145)

## 2016-10-06 LAB — D-DIMER, QUANTITATIVE (NOT AT ARMC): D DIMER QUANT: 1.8 ug{FEU}/mL — AB (ref 0.00–0.50)

## 2016-10-06 LAB — TROPONIN I: Troponin I: <0.03 ng/mL (ref <0.03)

## 2016-10-06 MED ORDER — TRAMADOL HCL 50 MG PO TABS
50.0000 mg | ORAL_TABLET | Freq: Once | ORAL | Status: AC
  Administered 2016-10-06: 50 mg via ORAL
  Filled 2016-10-06: qty 1

## 2016-10-06 MED ORDER — ACETAMINOPHEN 500 MG PO TABS
1000.0000 mg | ORAL_TABLET | Freq: Once | ORAL | Status: AC
  Administered 2016-10-06: 1000 mg via ORAL
  Filled 2016-10-06: qty 2

## 2016-10-06 MED ORDER — IOPAMIDOL (ISOVUE-370) INJECTION 76%
100.0000 mL | Freq: Once | INTRAVENOUS | Status: AC | PRN
  Administered 2016-10-06: 100 mL via INTRAVENOUS

## 2016-10-06 NOTE — ED Provider Notes (Signed)
Patient is stable. Second troponin and CT angiogram of chest showed no acute findings. Discussed with family.   Nat Christen, MD 10/06/16 1118

## 2016-10-06 NOTE — ED Triage Notes (Signed)
Pt c/o of chest pain which began in her right arm one hour again awakening her from sleep. 1 NItro given in route. Pt only c/o of right arm pain currently.

## 2016-10-06 NOTE — ED Provider Notes (Signed)
TIME SEEN: 4:45 AM  CHIEF COMPLAINT: Right shoulder pain; chest pain  HPI: Patient is a 80 year old female with history of hypertension, polymyalgia rheumatica, multiple myeloma on Revlimid x 21 days who presents the emergency department with complaints of right shoulder pain. History is very limited as patient is a very poor historian and seems confused. Sister at bedside who is her power of attorney was not with patient when EMS was called. Per patient she woke up this morning sometime between 4 and 4:30 AM with right shoulder pain. She states she did have central chest pain but is unable to describe it. Denies shortness of breath, nausea, vomiting, diaphoresis or dizziness. He is mostly complaining now of right shoulder pain that is worse with movement and palpation. No known injury that she is aware of. No numbness or tingling. No focal weakness. States she thinks she took a pain pill this morning. She is prescribed oxycodone. Her daughter who lives near her is to call EMS. Patient lives alone.  ROS: See HPI Constitutional: no fever  Eyes: no drainage  ENT: no runny nose   Cardiovascular:  chest pain  Resp: no SOB  GI: no vomiting GU: no dysuria Integumentary: no rash  Allergy: no hives  Musculoskeletal: no leg swelling  Neurological: no slurred speech ROS otherwise negative  PAST MEDICAL HISTORY/PAST SURGICAL HISTORY:  Past Medical History:  Diagnosis Date  . DDD (degenerative disc disease), lumbar   . Hypertension   . Multiple myeloma ([place]) 01/21/2016  . PMR (polymyalgia rheumatica) ([facility])     MEDICATIONS:  Prior to Admission medications   Medication Sig Start Date End Date Taking? Authorizing Provider  acyclovir (ZOVIRAX) 400 MG tablet Take 1 tablet (400 mg total) by mouth daily. 07/22/16   Heath Lark, MD  dexamethasone (DECADRON) 4 MG tablet Take 1 tablet (4 mg total) by mouth daily. 09/30/16   Heath Lark, MD  lenalidomide (REVLIMID) 5 MG capsule Take 1 capsule (5 mg total) by  mouth daily. 21 days on and 7 days off. 09/16/16   Heath Lark, MD  lisinopril (PRINIVIL,ZESTRIL) 40 MG tablet Take 1 tablet (40 mg total) by mouth daily. 09/16/16   Heath Lark, MD  oxyCODONE-acetaminophen (ROXICET) 5-325 MG tablet Take 1 tablet by mouth every 6 (six) hours as needed for severe pain. 09/30/16   Heath Lark, MD    ALLERGIES:  Allergies  Allergen Reactions  . Aspirin Nausea And Vomiting  . Pravastatin     Myalgia's  . Zocor [Simvastatin]     Myalgia's    SOCIAL HISTORY:  Social History  Substance Use Topics  . Smoking status: Never Smoker  . Smokeless tobacco: Never Used  . Alcohol use No    FAMILY HISTORY: Family History  Problem Relation Age of Onset  . Cancer Mother     lung ca  . Cancer Father     prostate ca  . Cancer Maternal Aunt     colon ca    EXAM: BP 146/83 (BP Location: Right Arm)   Pulse 61   Temp 98.3 F (36.8 C) (Oral)   Resp 17   SpO2 98%  CONSTITUTIONAL: Alert and oriented To person and place but not time. Seems very confused and has a hard time answering questions or staying on topic. She appears to be uncomfortable but not in any significant distress. She is afebrile and nontoxic appearing. HEAD: Normocephalic EYES: Conjunctivae clear, PERRL, EOMI ENT: normal nose; no rhinorrhea; moist mucous membranes NECK: Supple, no meningismus, no nuchal  rigidity, no LAD, no midline spinal tenderness or step-off or deformity  CARD: RRR; S1 and S2 appreciated; no murmurs, no clicks, no rubs, no gallops CHEST:  Mildly tender to palpation over the right chest wall without crepitus, ecchymosis or deformity. No rash or other lesions noted. RESP: Normal chest excursion without splinting or tachypnea; breath sounds clear and equal bilaterally; no wheezes, no rhonchi, no rales, no hypoxia or respiratory distress, speaking full sentences ABD/GI: Normal bowel sounds; non-distended; soft, non-tender, no rebound, no guarding, no peritoneal signs, no  hepatosplenomegaly BACK:  The back appears normal and is non-tender to palpation, there is no CVA tenderness, no midline spinal tenderness or step-off or deformity EXT: Patient has pain throughout the right shoulder to palpation especially over the insertion of the deltoid. She is unable to lift his shoulder past 45 without significant pain. No joint effusion, erythema or warmth. No loss of fullness of the shoulder other sign of dislocation. No bony deformity. Compartments in the right arm were soft. Reports normal sensation throughout the right arm. No tenderness over the right elbow, wrist or hand. 2+ radial pulses bilaterally. Otherwise Normal ROM in all joints; otherwise extremities are non-tender to palpation; no edema; normal capillary refill; no cyanosis, no calf tenderness or swelling    SKIN: Normal color for age and race; warm; no rash NEURO: Moves all extremities equally, sensation to light touch intact diffusely, cranial nerves II through XII intact, normal speech PSYCH: The patient's mood and manner are appropriate. Grooming and personal hygiene are appropriate.  MEDICAL DECISION MAKING: Patient here with right shoulder pain. Seems to be musculoskeletal in nature. Possible rotator cuff tendinitis. No sign of septic arthritis, gout on exam. No sign of bony injury. Neurovascular intact distally.   Conflicting stories about whether or not patient had chest pain. At times she states that she did not and then she states that she did. Was given nitroglycerin with EMS but is unable to tell at this helped her pain at all. Denies having any shortness of breath, nausea, diaphoresis or dizziness. No chest pain currently. EKG shows no ischemic abnormality. We'll obtain cardiac labs including d-dimer given her history of multiple myeloma. Will obtain a chest x-ray. Her heart score is 3.  We will give her tramadol, Tylenol for pain and reassess. Sister at bedside who is her power of attorney states the  patient would want full workup and is a full code.  ED PROGRESS: 6:30 AM  Pt's labs have been unremarkable including negative troponin. Chest x-ray shows pulmonary fibrosis but no other acute abnormality. X-ray of the right shoulder shows partial subluxation but no dislocation. There are lucencies in the bones consistent with her multiple myeloma but no pathologic fracture. She is able to move this are much better and there is no sign of dislocation on exam currently. She reports feeling much better after pain medication. Patient's sister was able to get in touch with patient's daughter who called 911. She states just before 4 AM patient was complaining of pain and patient was given a Roxicet which she has a prescription for. Given the subluxation, will place patient in a sling and have her follow-up with orthopedics. Plan is to repeat a second set of cardiac labs at 9:45 AM which would be almost 6 hours after the onset of symptoms. If this is negative, patient can be discharged home with outpatient follow-up and sister is comfortable with this plan. Patient's sister plans to leave the emergency department and will plan  to pick her up at 11 AM in the emergency department.  D dimer pending.  Pt's sister Gaspar Skeeters - [phone] (cell); [phone] (home)  6:50 AM  Pt's d-dimer is elevated to 1.80. Given her complaints of chest pain earlier today with a history of multiple myeloma, will obtain a CT scan to rule out pulmonary embolus. Creatinine is normal.  7:10 AM  Signed out to Dr. Lacinda Axon to follow up on CT imaging and 2nd troponin.  Patient and her sister are comfortable with plan for discharge home if this workup is negative. I do not feel at this time she needs admission at this workup is negative given complaints of chest pain seemed very vague and I think are likely musculoskeletal in nature and related to the pain in her right shoulder. I doubt that this is ACS, dissection and I have a low suspicion  for pulmonary embolus.   I reviewed all nursing notes, vitals, pertinent old records, EKGs, labs, imaging (as available).    EKG Interpretation  Date/Time:  Monday October 06 2016 04:38:28 EST Ventricular Rate:  58 PR Interval:    QRS Duration: 114 QT Interval:  483 QTC Calculation: 475 R Axis:   -15 Text Interpretation:  Sinus rhythm Probable anteroseptal infarct, old No significant change since last tracing Confirmed by WARD,  DO, KRISTEN [phone]) on 10/06/2016 4:49:39 AM          [place], DO 10/06/16 3790

## 2016-10-06 NOTE — ED Notes (Signed)
Pt disoriented to year. Pt poor historian regarding history details.

## 2016-10-06 NOTE — Discharge Instructions (Addendum)
You have a subluxation of your right shoulder. Please wear your sling at all times except for bathing. We recommend close follow-up with an orthopedic doctor. Please take your Roxicet as prescribed for pain.

## 2016-10-10 ENCOUNTER — Other Ambulatory Visit: Payer: Self-pay | Admitting: *Deleted

## 2016-10-10 MED ORDER — LENALIDOMIDE 5 MG PO CAPS
5.0000 mg | ORAL_CAPSULE | Freq: Every day | ORAL | 0 refills | Status: DC
Start: 2016-10-10 — End: 2016-11-03

## 2016-10-14 ENCOUNTER — Encounter: Payer: Self-pay | Admitting: Hematology and Oncology

## 2016-10-14 ENCOUNTER — Other Ambulatory Visit (HOSPITAL_BASED_OUTPATIENT_CLINIC_OR_DEPARTMENT_OTHER): Payer: Commercial Managed Care - HMO

## 2016-10-14 ENCOUNTER — Ambulatory Visit (HOSPITAL_BASED_OUTPATIENT_CLINIC_OR_DEPARTMENT_OTHER): Payer: Commercial Managed Care - HMO | Admitting: Hematology and Oncology

## 2016-10-14 ENCOUNTER — Telehealth: Payer: Self-pay | Admitting: Hematology and Oncology

## 2016-10-14 ENCOUNTER — Telehealth: Payer: Self-pay | Admitting: *Deleted

## 2016-10-14 ENCOUNTER — Ambulatory Visit (HOSPITAL_BASED_OUTPATIENT_CLINIC_OR_DEPARTMENT_OTHER): Payer: Commercial Managed Care - HMO

## 2016-10-14 VITALS — BP 135/67 | HR 60 | Temp 98.4°F | Resp 17 | Ht 63.0 in | Wt 146.2 lb

## 2016-10-14 DIAGNOSIS — C9001 Multiple myeloma in remission: Secondary | ICD-10-CM | POA: Diagnosis not present

## 2016-10-14 DIAGNOSIS — R413 Other amnesia: Secondary | ICD-10-CM

## 2016-10-14 DIAGNOSIS — G893 Neoplasm related pain (acute) (chronic): Secondary | ICD-10-CM | POA: Diagnosis not present

## 2016-10-14 DIAGNOSIS — Z7189 Other specified counseling: Secondary | ICD-10-CM

## 2016-10-14 DIAGNOSIS — Z5112 Encounter for antineoplastic immunotherapy: Secondary | ICD-10-CM

## 2016-10-14 LAB — COMPREHENSIVE METABOLIC PANEL
ALT: 22 U/L (ref 0–55)
AST: 20 U/L (ref 5–34)
Albumin: 3.4 g/dL — ABNORMAL LOW (ref 3.5–5.0)
Alkaline Phosphatase: 48 U/L (ref 40–150)
Anion Gap: 9 mEq/L (ref 3–11)
BUN: 15 mg/dL (ref 7.0–26.0)
CALCIUM: 9 mg/dL (ref 8.4–10.4)
CHLORIDE: 108 meq/L (ref 98–109)
CO2: 26 mEq/L (ref 22–29)
CREATININE: 0.7 mg/dL (ref 0.6–1.1)
EGFR: 83 mL/min/{1.73_m2} — ABNORMAL LOW (ref >90)
GLUCOSE: 93 mg/dL (ref 70–140)
Potassium: 3.9 mEq/L (ref 3.5–5.1)
SODIUM: 143 meq/L (ref 136–145)
Total Bilirubin: 0.41 mg/dL (ref 0.20–1.20)
Total Protein: 6.5 g/dL (ref 6.4–8.3)

## 2016-10-14 LAB — CBC WITH DIFFERENTIAL/PLATELET
BASO%: 1.5 % (ref 0.0–2.0)
Basophils Absolute: 0.1 10*3/uL (ref 0.0–0.1)
EOS%: 3.3 % (ref 0.0–7.0)
Eosinophils Absolute: 0.2 10*3/uL (ref 0.0–0.5)
HEMATOCRIT: 38.7 % (ref 34.8–46.6)
HEMOGLOBIN: 12.7 g/dL (ref 11.6–15.9)
LYMPH#: 1.5 10*3/uL (ref 0.9–3.3)
LYMPH%: 20.6 % (ref 14.0–49.7)
MCH: 31.2 pg (ref 25.1–34.0)
MCHC: 32.7 g/dL (ref 31.5–36.0)
MCV: 95.3 fL (ref 79.5–101.0)
MONO#: 1.1 10*3/uL — ABNORMAL HIGH (ref 0.1–0.9)
MONO%: 14.9 % — ABNORMAL HIGH (ref 0.0–14.0)
NEUT#: 4.4 10*3/uL (ref 1.5–6.5)
NEUT%: 59.7 % (ref 38.4–76.8)
Platelets: 178 10*3/uL (ref 145–400)
RBC: 4.06 10*6/uL (ref 3.70–5.45)
RDW: 15.3 % — AB (ref 11.2–14.5)
WBC: 7.4 10*3/uL (ref 3.9–10.3)

## 2016-10-14 MED ORDER — BORTEZOMIB CHEMO SQ INJECTION 3.5 MG (2.5MG/ML)
0.9750 mg/m2 | Freq: Once | INTRAMUSCULAR | Status: AC
  Administered 2016-10-14: 1.5 mg via SUBCUTANEOUS
  Filled 2016-10-14: qty 1.5

## 2016-10-14 MED ORDER — PROCHLORPERAZINE MALEATE 10 MG PO TABS
10.0000 mg | ORAL_TABLET | Freq: Once | ORAL | Status: AC
  Administered 2016-10-14: 10 mg via ORAL

## 2016-10-14 MED ORDER — PROCHLORPERAZINE MALEATE 10 MG PO TABS
ORAL_TABLET | ORAL | Status: AC
  Filled 2016-10-14: qty 1

## 2016-10-14 NOTE — Telephone Encounter (Signed)
Per LOS I have scheduled appts and notified the scheduler 

## 2016-10-14 NOTE — Progress Notes (Signed)
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 Revlimid was stopped in July and resumed in October      04/01/2016 Adverse Reaction    Treatment was placed on hold due to neutropenia       INTERVAL HISTORY: Please see below for problem oriented charting. She returns for follow-up. She tolerated additional doses of Revlimid well without side effects. She was recently seen in the emergency department due to atypical chest pain/left shoulder pain. Imaging study and blood work excluded acute coronary syndrome. Since she started taking additional prescription dexamethasone and pain medicine, her pain is better controlled. She started to gain weight. Denies recent infection  REVIEW OF SYSTEMS:   Constitutional: Denies fevers, chills or abnormal weight loss Eyes: Denies blurriness of vision Ears, nose, mouth, throat, and face: Denies mucositis or sore throat Respiratory: Denies cough, dyspnea or wheezes Cardiovascular: Denies palpitation, chest discomfort or lower extremity swelling Gastrointestinal:  Denies nausea, heartburn or change in bowel habits Skin: Denies abnormal skin rashes Lymphatics: Denies new lymphadenopathy or easy bruising Neurological:Denies numbness, tingling or new weaknesses Behavioral/Psych: Mood is stable, no new changes   All other systems were reviewed with the patient and are negative.  I have reviewed the past medical history, past surgical history, social history and family history with the patient and they are unchanged from previous note.  ALLERGIES:  is allergic to aspirin; pravastatin; and zocor [simvastatin].  MEDICATIONS:  Current Outpatient Prescriptions  Medication Sig Dispense Refill  . acyclovir (ZOVIRAX) 400 MG tablet Take 1 tablet (400 mg total) by mouth daily. 90 tablet 3  . dexamethasone (DECADRON) 4 MG tablet Take 1 tablet (4 mg total) by mouth daily. 60 tablet 1  . lenalidomide (REVLIMID) 5 MG capsule Take 1 capsule (5 mg total) by mouth daily. 21 days on and 7 days off. 21 capsule 0  . lisinopril (PRINIVIL,ZESTRIL) 40 MG tablet Take 1 tablet (40 mg total) by mouth daily. 30 tablet 11  . oxyCODONE-acetaminophen (ROXICET) 5-325 MG tablet Take 1 tablet by mouth every 6 (six) hours as needed for severe pain. 90 tablet 0   No current facility-administered medications for this visit.    Facility-Administered Medications Ordered in Other Visits  Medication Dose Route Frequency Provider Last Rate Last Dose  . bortezomib SQ (VELCADE) chemo injection 1.5 mg  0.975 mg/m2 (Treatment Plan Recorded) Subcutaneous Once Heath Lark, MD      . prochlorperazine (COMPAZINE) tablet 10 mg  10 mg Oral Once Heath Lark, MD        PHYSICAL EXAMINATION: ECOG PERFORMANCE STATUS: 0 - Asymptomatic  Vitals:   10/14/16 0821  BP: 135/67  Pulse: 60  Resp: 17  Temp: 98.4 F (36.9 C)   Filed Weights   10/14/16 0821  Weight: 146 lb  3.2 oz (66.3 kg)    GENERAL:alert, no distress and comfortable SKIN: skin color, texture, turgor are normal, no rashes or significant lesions EYES: normal, Conjunctiva are pink and non-injected, sclera clear OROPHARYNX:no exudate, no erythema and lips, buccal mucosa, and tongue normal  NECK: supple, thyroid normal size, non-tender, without nodularity LYMPH:  no palpable  lymphadenopathy in the cervical, axillary or inguinal LUNGS: clear to auscultation and percussion with normal breathing effort HEART: regular rate & rhythm and no murmurs and no lower extremity edema ABDOMEN:abdomen soft, non-tender and normal bowel sounds Musculoskeletal:no cyanosis of digits and no clubbing  NEURO: alert & oriented x 3 with fluent speech, no focal motor/sensory deficits  LABORATORY DATA:  I have reviewed the data as listed    Component Value Date/Time   NA 143 10/14/2016 0801   K 3.9 10/14/2016 0801   CL 107 10/06/2016 0526   CO2 26 10/14/2016 0801   GLUCOSE 93 10/14/2016 0801   BUN 15.0 10/14/2016 0801   CREATININE 0.7 10/14/2016 0801   CALCIUM 9.0 10/14/2016 0801   PROT 6.5 10/14/2016 0801   ALBUMIN 3.4 (L) 10/14/2016 0801   AST 20 10/14/2016 0801   ALT 22 10/14/2016 0801   ALKPHOS 48 10/14/2016 0801   BILITOT 0.41 10/14/2016 0801   GFRNONAA >60 10/06/2016 0526   GFRNONAA 64 01/17/2016 0910   GFRAA >60 10/06/2016 0526   GFRAA 74 01/17/2016 0910    No results found for: SPEP, UPEP  Lab Results  Component Value Date   WBC 7.4 10/14/2016   NEUTROABS 4.4 10/14/2016   HGB 12.7 10/14/2016   HCT 38.7 10/14/2016   MCV 95.3 10/14/2016   PLT 178 10/14/2016      Chemistry      Component Value Date/Time   NA 143 10/14/2016 0801   K 3.9 10/14/2016 0801   CL 107 10/06/2016 0526   CO2 26 10/14/2016 0801   BUN 15.0 10/14/2016 0801   CREATININE 0.7 10/14/2016 0801      Component Value Date/Time   CALCIUM 9.0 10/14/2016 0801   ALKPHOS 48 10/14/2016 0801   AST 20 10/14/2016 0801   ALT 22 10/14/2016 0801   BILITOT 0.41 10/14/2016 0801      ASSESSMENT & PLAN:  Multiple myeloma in remission ([place]) She resumed Revlimid last month without complications We will continue on Velcade every other week injection and Revlimid, 5 mg, 21 days on, 7 days off along with quarterly Zometa. She will continue acyclovir, dexamethasone, calcium, vitamin D and aspirin I  will see her back in 4 weeks for toxicity review  Cancer associated pain Her pain is mildly improved since we begun treatment and added daily dexamethasone Recently, she has atypical chest pain/shoulder pain and she takes narcotic prescription She will continue to take the medication as needed for pain.  Memory deficits She has poor memory and signs of dementia. I do not believe she is capable of making her own medical decision and I defer to her sister to make decision for her.  Goals of care, counseling/discussion We discussed goals of care. For now, the patient feels well. We discussed the importance of advanced directives and appointment of dedicated medical healthcare power of attorney. Her sister brought paperwork signed for dedicated medical power of attorney For now, they want her to remain on aggressive treatment   Orders Placed This Encounter  Procedures  . Kappa/lambda light chains    Standing Status:   Future    Standing Expiration Date:   11/18/2017  .  Multiple Myeloma Panel (SPEP&IFE w/QIG)    Standing Status:   Future    Standing Expiration Date:   11/18/2017   All questions were answered. The patient knows to call the clinic with any problems, questions or concerns. No barriers to learning was detected. I spent 15 minutes counseling the patient face to face. The total time spent in the appointment was 20 minutes and more than 50% was on counseling and review of test results     Heath Lark, MD 10/14/2016 8:40 AM

## 2016-10-14 NOTE — Assessment & Plan Note (Signed)
She resumed Revlimid last month without complications We will continue on Velcade every other week injection and Revlimid, 5 mg, 21 days on, 7 days off along with quarterly Zometa. She will continue acyclovir, dexamethasone, calcium, vitamin D and aspirin I will see her back in 4 weeks for toxicity review

## 2016-10-14 NOTE — Patient Instructions (Signed)
[facility] Discharge Instructions for Patients Receiving Chemotherapy  Today you received the following chemotherapy agents Velcade. To help prevent nausea and vomiting after your treatment, we encourage you to take your nausea medication as directed.  If you develop nausea and vomiting that is not controlled by your nausea medication, call the clinic.   BELOW ARE SYMPTOMS THAT SHOULD BE REPORTED IMMEDIATELY:  *FEVER GREATER THAN 100.5 F  *CHILLS WITH OR WITHOUT FEVER  NAUSEA AND VOMITING THAT IS NOT CONTROLLED WITH YOUR NAUSEA MEDICATION  *UNUSUAL SHORTNESS OF BREATH  *UNUSUAL BRUISING OR BLEEDING  TENDERNESS IN MOUTH AND THROAT WITH OR WITHOUT PRESENCE OF ULCERS  *URINARY PROBLEMS  *BOWEL PROBLEMS  UNUSUAL RASH Items with * indicate a potential emergency and should be followed up as soon as possible.  Feel free to call the clinic you have any questions or concerns. The clinic phone number is [phone].  Please show the CHEMO ALERT CARD at check-in to the Emergency Department and triage nurse.    

## 2016-10-14 NOTE — Assessment & Plan Note (Signed)
Her pain is mildly improved since we begun treatment and added daily dexamethasone Recently, she has atypical chest pain/shoulder pain and she takes narcotic prescription She will continue to take the medication as needed for pain.

## 2016-10-14 NOTE — Assessment & Plan Note (Signed)
She has poor memory and signs of dementia. I do not believe she is capable of making her own medical decision and I defer to her sister to make decision for her.

## 2016-10-14 NOTE — Assessment & Plan Note (Signed)
We discussed goals of care. For now, the patient feels well. We discussed the importance of advanced directives and appointment of dedicated medical healthcare power of attorney. Her sister brought paperwork signed for dedicated medical power of attorney For now, they want her to remain on aggressive treatment 

## 2016-10-14 NOTE — Telephone Encounter (Signed)
Message sent to chemo scheduler to be added. Appointments scheduled per 10/14/16 los. A copy of the AVS report and appointment schedule was given to the patient, per 10/14/16 los.

## 2016-10-24 MED FILL — LISINOPRIL 40 MG TABLET: 40 | 30 days supply | Qty: 30 | Fill #1

## 2016-10-24 MED FILL — ACYCLOVIR 400 MG TABLET: 400 | 90 days supply | Qty: 90 | Fill #1

## 2016-10-28 ENCOUNTER — Ambulatory Visit (HOSPITAL_BASED_OUTPATIENT_CLINIC_OR_DEPARTMENT_OTHER): Payer: Commercial Managed Care - HMO

## 2016-10-28 ENCOUNTER — Other Ambulatory Visit (HOSPITAL_BASED_OUTPATIENT_CLINIC_OR_DEPARTMENT_OTHER): Payer: Commercial Managed Care - HMO

## 2016-10-28 VITALS — BP 145/88 | HR 79 | Temp 98.0°F | Resp 16

## 2016-10-28 DIAGNOSIS — C9001 Multiple myeloma in remission: Secondary | ICD-10-CM

## 2016-10-28 DIAGNOSIS — Z5112 Encounter for antineoplastic immunotherapy: Secondary | ICD-10-CM

## 2016-10-28 LAB — CBC WITH DIFFERENTIAL/PLATELET
BASO%: 0.5 % (ref 0.0–2.0)
BASOS ABS: 0 10*3/uL (ref 0.0–0.1)
EOS ABS: 0 10*3/uL (ref 0.0–0.5)
EOS%: 0.7 % (ref 0.0–7.0)
HCT: 40.5 % (ref 34.8–46.6)
HEMOGLOBIN: 13.4 g/dL (ref 11.6–15.9)
LYMPH%: 20.2 % (ref 14.0–49.7)
MCH: 31.2 pg (ref 25.1–34.0)
MCHC: 33.1 g/dL (ref 31.5–36.0)
MCV: 94.2 fL (ref 79.5–101.0)
MONO#: 0.5 10*3/uL (ref 0.1–0.9)
MONO%: 8.9 % (ref 0.0–14.0)
NEUT#: 4.1 10*3/uL (ref 1.5–6.5)
NEUT%: 69.7 % (ref 38.4–76.8)
Platelets: 209 10*3/uL (ref 145–400)
RBC: 4.3 10*6/uL (ref 3.70–5.45)
RDW: 14.5 % (ref 11.2–14.5)
WBC: 5.9 10*3/uL (ref 3.9–10.3)
lymph#: 1.2 10*3/uL (ref 0.9–3.3)

## 2016-10-28 LAB — COMPREHENSIVE METABOLIC PANEL
ALK PHOS: 60 U/L (ref 40–150)
ALT: 21 U/L (ref 0–55)
AST: 19 U/L (ref 5–34)
Albumin: 3.5 g/dL (ref 3.5–5.0)
Anion Gap: 11 mEq/L (ref 3–11)
BUN: 15.7 mg/dL (ref 7.0–26.0)
CHLORIDE: 108 meq/L (ref 98–109)
CO2: 23 meq/L (ref 22–29)
Calcium: 9.1 mg/dL (ref 8.4–10.4)
Creatinine: 0.7 mg/dL (ref 0.6–1.1)
EGFR: 83 mL/min/{1.73_m2} — AB (ref >90)
GLUCOSE: 98 mg/dL (ref 70–140)
POTASSIUM: 4.2 meq/L (ref 3.5–5.1)
SODIUM: 142 meq/L (ref 136–145)
Total Bilirubin: 0.44 mg/dL (ref 0.20–1.20)
Total Protein: 7.2 g/dL (ref 6.4–8.3)

## 2016-10-28 MED ORDER — PROCHLORPERAZINE MALEATE 10 MG PO TABS
10.0000 mg | ORAL_TABLET | Freq: Once | ORAL | Status: AC
  Administered 2016-10-28: 10 mg via ORAL

## 2016-10-28 MED ORDER — BORTEZOMIB CHEMO SQ INJECTION 3.5 MG (2.5MG/ML)
0.9750 mg/m2 | Freq: Once | INTRAMUSCULAR | Status: AC
  Administered 2016-10-28: 1.5 mg via SUBCUTANEOUS
  Filled 2016-10-28: qty 1.5

## 2016-10-28 MED ORDER — PROCHLORPERAZINE MALEATE 10 MG PO TABS
ORAL_TABLET | ORAL | Status: AC
  Filled 2016-10-28: qty 1

## 2016-10-28 NOTE — Patient Instructions (Signed)
[facility] Discharge Instructions for Patients Receiving Chemotherapy  Today you received the following chemotherapy agents Velcade. To help prevent nausea and vomiting after your treatment, we encourage you to take your nausea medication as directed.  If you develop nausea and vomiting that is not controlled by your nausea medication, call the clinic.   BELOW ARE SYMPTOMS THAT SHOULD BE REPORTED IMMEDIATELY:  *FEVER GREATER THAN 100.5 F  *CHILLS WITH OR WITHOUT FEVER  NAUSEA AND VOMITING THAT IS NOT CONTROLLED WITH YOUR NAUSEA MEDICATION  *UNUSUAL SHORTNESS OF BREATH  *UNUSUAL BRUISING OR BLEEDING  TENDERNESS IN MOUTH AND THROAT WITH OR WITHOUT PRESENCE OF ULCERS  *URINARY PROBLEMS  *BOWEL PROBLEMS  UNUSUAL RASH Items with * indicate a potential emergency and should be followed up as soon as possible.  Feel free to call the clinic you have any questions or concerns. The clinic phone number is [phone].  Please show the CHEMO ALERT CARD at check-in to the Emergency Department and triage nurse.    

## 2016-10-29 LAB — KAPPA/LAMBDA LIGHT CHAINS
IG KAPPA FREE LIGHT CHAIN: 136.8 mg/L — AB (ref 3.3–19.4)
Ig Lambda Free Light Chain: 7.8 mg/L (ref 5.7–26.3)
Kappa/Lambda FluidC Ratio: 17.54 — ABNORMAL HIGH (ref 0.26–1.65)

## 2016-10-30 ENCOUNTER — Encounter: Payer: Self-pay | Admitting: Family Medicine

## 2016-10-30 ENCOUNTER — Ambulatory Visit (INDEPENDENT_AMBULATORY_CARE_PROVIDER_SITE_OTHER): Payer: Commercial Managed Care - HMO | Admitting: Family Medicine

## 2016-10-30 VITALS — BP 152/90 | HR 60 | Temp 98.3°F | Resp 14 | Ht 63.0 in | Wt 145.0 lb

## 2016-10-30 DIAGNOSIS — B351 Tinea unguium: Secondary | ICD-10-CM

## 2016-10-30 MED ORDER — TERBINAFINE HCL 250 MG PO TABS: 250.0000 mg | ORAL_TABLET | Freq: Every day | ORAL | 2 refills | Status: DC

## 2016-10-30 MED FILL — TERBINAFINE HCL 250 MG TAB: 250 | 30 days supply | Qty: 30 | Fill #0

## 2016-10-30 NOTE — Progress Notes (Signed)
   Subjective:    Patient ID: Kimberly Friedman, female    DOB: 1935-11-22, 80 y.o.   MRN: [id number]  HPI Patient has thick dysmorphic toenails on both great toes. The toenails are yellow extremely thick and painful with splitting. She appears to have onychomycosis. She is requesting treatment due to discomfort. Past Medical History:  Diagnosis Date  . DDD (degenerative disc disease), lumbar   . Hypertension   . Multiple myeloma ([place]) 01/21/2016  . PMR (polymyalgia rheumatica) (HCC)    Past Surgical History:  Procedure Laterality Date  . HEMORRHOID SURGERY     Current Outpatient Prescriptions on File Prior to Visit  Medication Sig Dispense Refill  . acyclovir (ZOVIRAX) 400 MG tablet Take 1 tablet (400 mg total) by mouth daily. 90 tablet 3  . dexamethasone (DECADRON) 4 MG tablet Take 1 tablet (4 mg total) by mouth daily. 60 tablet 1  . lenalidomide (REVLIMID) 5 MG capsule Take 1 capsule (5 mg total) by mouth daily. 21 days on and 7 days off. 21 capsule 0  . lisinopril (PRINIVIL,ZESTRIL) 40 MG tablet Take 1 tablet (40 mg total) by mouth daily. 30 tablet 11  . oxyCODONE-acetaminophen (ROXICET) 5-325 MG tablet Take 1 tablet by mouth every 6 (six) hours as needed for severe pain. 90 tablet 0   No current facility-administered medications on file prior to visit.    Allergies  Allergen Reactions  . Aspirin Nausea And Vomiting  . Pravastatin     Myalgia's  . Zocor [Simvastatin]     Myalgia's   Social History   Social History  . Marital status: Widowed    Spouse name: N/A  . Number of children: N/A  . Years of education: N/A   Occupational History  . Not on file.   Social History Main Topics  . Smoking status: Never Smoker  . Smokeless tobacco: Never Used  . Alcohol use No  . Drug use: No  . 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 Not on file   Social History Narrative  . No narrative on file     Review of  Systems  All other systems reviewed and are negative.      Objective:   Physical Exam  Cardiovascular: Normal rate and regular rhythm.   Pulmonary/Chest: Effort normal and breath sounds normal.  Abdominal: Soft. Bowel sounds are normal.  Vitals reviewed.         Assessment & Plan:  Onychomycosis - Plan: terbinafine (LAMISIL) 250 MG tablet  I bluntly debrided the toenails as much as possible using a pair of nippers.  Given Lamisil 250 mg by mouth daily for 30 days. Then recheck liver function tests. Continue this for 3 months total as long as there is not elevation of the liver function test.

## 2016-10-31 LAB — MULTIPLE MYELOMA PANEL, SERUM
ALBUMIN/GLOB SERPL: 1.2 (ref 0.7–1.7)
ALPHA2 GLOB SERPL ELPH-MCNC: 0.9 g/dL (ref 0.4–1.0)
Albumin SerPl Elph-Mcnc: 3.6 g/dL (ref 2.9–4.4)
Alpha 1: 0.3 g/dL (ref 0.0–0.4)
B-GLOBULIN SERPL ELPH-MCNC: 1.5 g/dL — AB (ref 0.7–1.3)
Gamma Glob SerPl Elph-Mcnc: 0.4 g/dL (ref 0.4–1.8)
Globulin, Total: 3.1 g/dL (ref 2.2–3.9)
IGM (IMMUNOGLOBIN M), SRM: 14 mg/dL — AB (ref 26–217)
IgA, Qn, Serum: 371 mg/dL (ref 64–422)
IgG, Qn, Serum: 437 mg/dL — ABNORMAL LOW (ref 700–1600)
M Protein SerPl Elph-Mcnc: 0.4 g/dL — ABNORMAL HIGH
Total Protein: 6.7 g/dL (ref 6.0–8.5)

## 2016-11-03 ENCOUNTER — Other Ambulatory Visit: Payer: Self-pay | Admitting: *Deleted

## 2016-11-03 MED ORDER — LENALIDOMIDE 5 MG PO CAPS: 5.0000 mg | ORAL_CAPSULE | Freq: Every day | ORAL | 0 refills | Status: DC

## 2016-11-13 ENCOUNTER — Telehealth: Payer: Self-pay | Admitting: Hematology and Oncology

## 2016-11-13 ENCOUNTER — Other Ambulatory Visit (HOSPITAL_BASED_OUTPATIENT_CLINIC_OR_DEPARTMENT_OTHER): Payer: Commercial Managed Care - HMO

## 2016-11-13 ENCOUNTER — Ambulatory Visit (HOSPITAL_BASED_OUTPATIENT_CLINIC_OR_DEPARTMENT_OTHER): Payer: Commercial Managed Care - HMO | Admitting: Hematology and Oncology

## 2016-11-13 ENCOUNTER — Ambulatory Visit (HOSPITAL_BASED_OUTPATIENT_CLINIC_OR_DEPARTMENT_OTHER): Payer: Commercial Managed Care - HMO

## 2016-11-13 ENCOUNTER — Encounter: Payer: Self-pay | Admitting: Hematology and Oncology

## 2016-11-13 VITALS — BP 165/73 | HR 70 | Temp 97.8°F | Resp 18 | Ht 63.0 in | Wt 143.6 lb

## 2016-11-13 DIAGNOSIS — M48061 Spinal stenosis, lumbar region without neurogenic claudication: Secondary | ICD-10-CM

## 2016-11-13 DIAGNOSIS — G893 Neoplasm related pain (acute) (chronic): Secondary | ICD-10-CM

## 2016-11-13 DIAGNOSIS — Z5112 Encounter for antineoplastic immunotherapy: Secondary | ICD-10-CM | POA: Diagnosis not present

## 2016-11-13 DIAGNOSIS — C9001 Multiple myeloma in remission: Secondary | ICD-10-CM | POA: Diagnosis not present

## 2016-11-13 DIAGNOSIS — B349 Viral infection, unspecified: Secondary | ICD-10-CM | POA: Diagnosis not present

## 2016-11-13 LAB — COMPREHENSIVE METABOLIC PANEL
ALBUMIN: 3.5 g/dL (ref 3.5–5.0)
ALK PHOS: 65 U/L (ref 40–150)
ALT: 19 U/L (ref 0–55)
AST: 16 U/L (ref 5–34)
Anion Gap: 12 mEq/L — ABNORMAL HIGH (ref 3–11)
BUN: 21.1 mg/dL (ref 7.0–26.0)
CALCIUM: 9.3 mg/dL (ref 8.4–10.4)
CO2: 23 mEq/L (ref 22–29)
CREATININE: 0.9 mg/dL (ref 0.6–1.1)
Chloride: 110 mEq/L — ABNORMAL HIGH (ref 98–109)
EGFR: 66 mL/min/{1.73_m2} — ABNORMAL LOW (ref >90)
GLUCOSE: 111 mg/dL (ref 70–140)
Potassium: 4.6 mEq/L (ref 3.5–5.1)
Sodium: 145 mEq/L (ref 136–145)
TOTAL PROTEIN: 6.7 g/dL (ref 6.4–8.3)
Total Bilirubin: 0.39 mg/dL (ref 0.20–1.20)

## 2016-11-13 LAB — CBC WITH DIFFERENTIAL/PLATELET
BASO%: 0.7 % (ref 0.0–2.0)
BASOS ABS: 0.1 10*3/uL (ref 0.0–0.1)
EOS%: 0.6 % (ref 0.0–7.0)
Eosinophils Absolute: 0.1 10*3/uL (ref 0.0–0.5)
HEMATOCRIT: 39.8 % (ref 34.8–46.6)
HGB: 13.2 g/dL (ref 11.6–15.9)
LYMPH#: 1.6 10*3/uL (ref 0.9–3.3)
LYMPH%: 17.5 % (ref 14.0–49.7)
MCH: 31.4 pg (ref 25.1–34.0)
MCHC: 33.2 g/dL (ref 31.5–36.0)
MCV: 94.5 fL (ref 79.5–101.0)
MONO#: 1.1 10*3/uL — ABNORMAL HIGH (ref 0.1–0.9)
MONO%: 12.7 % (ref 0.0–14.0)
NEUT%: 68.5 % (ref 38.4–76.8)
NEUTROS ABS: 6.1 10*3/uL (ref 1.5–6.5)
Platelets: 184 10*3/uL (ref 145–400)
RBC: 4.21 10*6/uL (ref 3.70–5.45)
RDW: 14.2 % (ref 11.2–14.5)
WBC: 8.9 10*3/uL (ref 3.9–10.3)

## 2016-11-13 MED ORDER — BORTEZOMIB CHEMO SQ INJECTION 3.5 MG (2.5MG/ML)
0.9750 mg/m2 | Freq: Once | INTRAMUSCULAR | Status: AC
  Administered 2016-11-13: 1.5 mg via SUBCUTANEOUS
  Filled 2016-11-13: qty 1.5

## 2016-11-13 MED ORDER — OXYCODONE-ACETAMINOPHEN 5-325 MG PO TABS: 1.0000 | ORAL_TABLET | Freq: Four times a day (QID) | ORAL | 0 refills | Status: DC | PRN

## 2016-11-13 MED ORDER — PROCHLORPERAZINE MALEATE 10 MG PO TABS
10.0000 mg | ORAL_TABLET | Freq: Once | ORAL | Status: AC
  Administered 2016-11-13: 10 mg via ORAL

## 2016-11-13 MED ORDER — PROCHLORPERAZINE MALEATE 10 MG PO TABS
ORAL_TABLET | ORAL | Status: AC
  Filled 2016-11-13: qty 1

## 2016-11-13 NOTE — Progress Notes (Signed)
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 Revlimid was stopped in July and resumed in October      04/01/2016 Adverse Reaction    Treatment was placed on hold due to neutropenia       INTERVAL HISTORY: Please see below for problem oriented charting. She returns with her sister. She is compliant taking medications as instructed. Her back pain is stable. She has some viral congestion and redness in her eye. No cough or fever  REVIEW OF SYSTEMS:   Constitutional: Denies fevers, chills or abnormal weight loss Eyes: Denies blurriness of vision Ears, nose, mouth, throat, and face: Denies mucositis or sore throat Respiratory: Denies cough, dyspnea or wheezes Cardiovascular: Denies palpitation, chest discomfort or lower extremity swelling Gastrointestinal:  Denies nausea, heartburn or change in bowel habits Skin: Denies abnormal skin rashes Lymphatics: Denies new lymphadenopathy or easy bruising Neurological:Denies numbness, tingling or new weaknesses Behavioral/Psych: Mood is stable, no new changes  All other systems were reviewed with the patient and are negative.  I have reviewed the past medical history, past surgical history, social history and family history with the patient and they are unchanged from previous note.  ALLERGIES:  is  allergic to aspirin; pravastatin; and zocor [simvastatin].  MEDICATIONS:  Current Outpatient Prescriptions  Medication Sig Dispense Refill  . acyclovir (ZOVIRAX) 400 MG tablet Take 1 tablet (400 mg total) by mouth daily. 90 tablet 3  . dexamethasone (DECADRON) 4 MG tablet Take 1 tablet (4 mg total) by mouth daily. 60 tablet 1  . lenalidomide (REVLIMID) 5 MG capsule Take 1 capsule (5 mg total) by mouth daily. 21 days on and 7 days off. 21 capsule 0  . lisinopril (PRINIVIL,ZESTRIL) 40 MG tablet Take 1 tablet (40 mg total) by mouth daily. 30 tablet 11  . oxyCODONE-acetaminophen (ROXICET) 5-325 MG tablet Take 1 tablet by mouth every 6 (six) hours as needed for severe pain. 90 tablet 0  . terbinafine (LAMISIL) 250 MG tablet Take 1 tablet (250 mg total) by mouth daily. 30 tablet 2   No current facility-administered medications for this visit.     PHYSICAL EXAMINATION: ECOG PERFORMANCE STATUS: 1 - Symptomatic but completely ambulatory  Vitals:   11/13/16 0833  BP: (!) 165/73  Pulse: 70  Resp: 18  Temp: 97.8 F (36.6 C)   Filed Weights   11/13/16 0833  Weight: 143 lb 9.6 oz (65.1 kg)    GENERAL:alert, no distress and comfortable SKIN: skin color, texture, turgor are normal, no rashes or significant lesions EYES: noted mild redness in her sclera OROPHARYNX:no exudate, no erythema and lips, buccal mucosa, and tongue normal  NECK: supple, thyroid normal size, non-tender, without nodularity LYMPH:  no palpable lymphadenopathy in the cervical, axillary or inguinal LUNGS: clear to auscultation and percussion  with normal breathing effort HEART: regular rate & rhythm and no murmurs and no lower extremity edema ABDOMEN:abdomen soft, non-tender and normal bowel sounds Musculoskeletal:no cyanosis of digits and no clubbing  NEURO: alert & oriented x 3 with fluent speech, no focal motor/sensory deficits  LABORATORY DATA:  I have reviewed the data as listed    Component Value Date/Time   NA  145 11/13/2016 0816   K 4.6 11/13/2016 0816   CL 107 10/06/2016 0526   CO2 23 11/13/2016 0816   GLUCOSE 111 11/13/2016 0816   BUN 21.1 11/13/2016 0816   CREATININE 0.9 11/13/2016 0816   CALCIUM 9.3 11/13/2016 0816   PROT 6.7 11/13/2016 0816   ALBUMIN 3.5 11/13/2016 0816   AST 16 11/13/2016 0816   ALT 19 11/13/2016 0816   ALKPHOS 65 11/13/2016 0816   BILITOT 0.39 11/13/2016 0816   GFRNONAA >60 10/06/2016 0526   GFRNONAA 64 01/17/2016 0910   GFRAA >60 10/06/2016 0526   GFRAA 74 01/17/2016 0910    No results found for: SPEP, UPEP  Lab Results  Component Value Date   WBC 8.9 11/13/2016   NEUTROABS 6.1 11/13/2016   HGB 13.2 11/13/2016   HCT 39.8 11/13/2016   MCV 94.5 11/13/2016   PLT 184 11/13/2016      Chemistry      Component Value Date/Time   NA 145 11/13/2016 0816   K 4.6 11/13/2016 0816   CL 107 10/06/2016 0526   CO2 23 11/13/2016 0816   BUN 21.1 11/13/2016 0816   CREATININE 0.9 11/13/2016 0816      Component Value Date/Time   CALCIUM 9.3 11/13/2016 0816   ALKPHOS 65 11/13/2016 0816   AST 16 11/13/2016 0816   ALT 19 11/13/2016 0816   BILITOT 0.39 11/13/2016 0816       ASSESSMENT & PLAN:  Multiple myeloma in remission ([place]) She resumed Revlimid last month without complications We will continue on Velcade every other week injection and Revlimid, 5 mg, 21 days on, 7 days off along with quarterly Zometa. She will continue acyclovir, dexamethasone, calcium, vitamin D and aspirin I will see her back in 4 weeks for toxicity review  Viral illness She has signs of mild viral illness. We will proceed with treatment. She will continue acyclovir and I recommend over-the-counter remedies.  Cancer associated pain Her pain is mildly improved since we begun treatment and added daily dexamethasone Recently, she has atypical chest pain/shoulder pain and she takes narcotic prescription She will continue to take the medication as needed for pain.   Orders Placed This  Encounter  Procedures  . Kappa/lambda light chains    Standing Status:   Future    Standing Expiration Date:   12/18/2017  . Multiple Myeloma Panel (SPEP&IFE w/QIG)    Standing Status:   Future    Standing Expiration Date:   12/18/2017   All questions were answered. The patient knows to call the clinic with any problems, questions or concerns. No barriers to learning was detected. I spent 15 minutes counseling the patient face to face. The total time spent in the appointment was 20 minutes and more than 50% was on counseling and review of test results     Heath Lark, MD 11/13/2016 11:05 AM

## 2016-11-13 NOTE — Assessment & Plan Note (Signed)
Her pain is mildly improved since we begun treatment and added daily dexamethasone Recently, she has atypical chest pain/shoulder pain and she takes narcotic prescription She will continue to take the medication as needed for pain.

## 2016-11-13 NOTE — Patient Instructions (Signed)
[facility] Discharge Instructions for Patients Receiving Chemotherapy  Today you received the following chemotherapy agents Velcade. To help prevent nausea and vomiting after your treatment, we encourage you to take your nausea medication as directed.  If you develop nausea and vomiting that is not controlled by your nausea medication, call the clinic.   BELOW ARE SYMPTOMS THAT SHOULD BE REPORTED IMMEDIATELY:  *FEVER GREATER THAN 100.5 F  *CHILLS WITH OR WITHOUT FEVER  NAUSEA AND VOMITING THAT IS NOT CONTROLLED WITH YOUR NAUSEA MEDICATION  *UNUSUAL SHORTNESS OF BREATH  *UNUSUAL BRUISING OR BLEEDING  TENDERNESS IN MOUTH AND THROAT WITH OR WITHOUT PRESENCE OF ULCERS  *URINARY PROBLEMS  *BOWEL PROBLEMS  UNUSUAL RASH Items with * indicate a potential emergency and should be followed up as soon as possible.  Feel free to call the clinic you have any questions or concerns. The clinic phone number is [phone].  Please show the CHEMO ALERT CARD at check-in to the Emergency Department and triage nurse.    

## 2016-11-13 NOTE — Assessment & Plan Note (Signed)
She resumed Revlimid last month without complications We will continue on Velcade every other week injection and Revlimid, 5 mg, 21 days on, 7 days off along with quarterly Zometa. She will continue acyclovir, dexamethasone, calcium, vitamin D and aspirin I will see her back in 4 weeks for toxicity review

## 2016-11-13 NOTE — Assessment & Plan Note (Signed)
She has signs of mild viral illness. We will proceed with treatment. She will continue acyclovir and I recommend over-the-counter remedies.

## 2016-11-13 NOTE — Telephone Encounter (Signed)
Chemo, labs and follow up with Dr Alvy Bimler was scheduled, per 11/13/16 los. Patient was given a copy of the AVS report and appointment schedule, per 11/13/16 los.

## 2016-11-25 ENCOUNTER — Ambulatory Visit (HOSPITAL_BASED_OUTPATIENT_CLINIC_OR_DEPARTMENT_OTHER): Payer: Medicare HMO

## 2016-11-25 ENCOUNTER — Other Ambulatory Visit (HOSPITAL_BASED_OUTPATIENT_CLINIC_OR_DEPARTMENT_OTHER): Payer: Medicare HMO

## 2016-11-25 VITALS — BP 133/70 | HR 58 | Temp 97.7°F | Resp 18

## 2016-11-25 DIAGNOSIS — C9001 Multiple myeloma in remission: Secondary | ICD-10-CM | POA: Diagnosis not present

## 2016-11-25 DIAGNOSIS — Z5112 Encounter for antineoplastic immunotherapy: Secondary | ICD-10-CM | POA: Diagnosis not present

## 2016-11-25 LAB — COMPREHENSIVE METABOLIC PANEL
ALBUMIN: 3.4 g/dL — AB (ref 3.5–5.0)
ALK PHOS: 64 U/L (ref 40–150)
ALT: 15 U/L (ref 0–55)
AST: 16 U/L (ref 5–34)
Anion Gap: 11 mEq/L (ref 3–11)
BUN: 15.9 mg/dL (ref 7.0–26.0)
CALCIUM: 9.5 mg/dL (ref 8.4–10.4)
CHLORIDE: 106 meq/L (ref 98–109)
CO2: 25 mEq/L (ref 22–29)
Creatinine: 0.8 mg/dL (ref 0.6–1.1)
EGFR: 72 mL/min/{1.73_m2} — ABNORMAL LOW (ref >90)
Glucose: 113 mg/dl (ref 70–140)
POTASSIUM: 4.3 meq/L (ref 3.5–5.1)
Sodium: 141 mEq/L (ref 136–145)
Total Bilirubin: 0.44 mg/dL (ref 0.20–1.20)
Total Protein: 6.9 g/dL (ref 6.4–8.3)

## 2016-11-25 LAB — CBC WITH DIFFERENTIAL/PLATELET
BASO%: 1.3 % (ref 0.0–2.0)
BASOS ABS: 0.1 10*3/uL (ref 0.0–0.1)
EOS%: 2.9 % (ref 0.0–7.0)
Eosinophils Absolute: 0.2 10*3/uL (ref 0.0–0.5)
HEMATOCRIT: 40.5 % (ref 34.8–46.6)
HEMOGLOBIN: 13.5 g/dL (ref 11.6–15.9)
LYMPH#: 1.2 10*3/uL (ref 0.9–3.3)
LYMPH%: 15.2 % (ref 14.0–49.7)
MCH: 31.3 pg (ref 25.1–34.0)
MCHC: 33.3 g/dL (ref 31.5–36.0)
MCV: 94 fL (ref 79.5–101.0)
MONO#: 0.4 10*3/uL (ref 0.1–0.9)
MONO%: 5.1 % (ref 0.0–14.0)
NEUT#: 5.8 10*3/uL (ref 1.5–6.5)
NEUT%: 75.5 % (ref 38.4–76.8)
Platelets: 182 10*3/uL (ref 145–400)
RBC: 4.31 10*6/uL (ref 3.70–5.45)
RDW: 14.2 % (ref 11.2–14.5)
WBC: 7.7 10*3/uL (ref 3.9–10.3)

## 2016-11-25 MED ORDER — BORTEZOMIB CHEMO SQ INJECTION 3.5 MG (2.5MG/ML)
0.9750 mg/m2 | Freq: Once | INTRAMUSCULAR | Status: AC
  Administered 2016-11-25: 1.5 mg via SUBCUTANEOUS
  Filled 2016-11-25: qty 1.5

## 2016-11-25 MED ORDER — PROCHLORPERAZINE MALEATE 10 MG PO TABS
10.0000 mg | ORAL_TABLET | Freq: Once | ORAL | Status: AC
  Administered 2016-11-25: 10 mg via ORAL

## 2016-11-25 MED ORDER — PROCHLORPERAZINE MALEATE 10 MG PO TABS
ORAL_TABLET | ORAL | Status: AC
  Filled 2016-11-25: qty 1

## 2016-11-25 NOTE — Patient Instructions (Signed)
[facility] Discharge Instructions for Patients Receiving Chemotherapy  Today you received the following chemotherapy agents Velcade. To help prevent nausea and vomiting after your treatment, we encourage you to take your nausea medication as directed.  If you develop nausea and vomiting that is not controlled by your nausea medication, call the clinic.   BELOW ARE SYMPTOMS THAT SHOULD BE REPORTED IMMEDIATELY:  *FEVER GREATER THAN 100.5 F  *CHILLS WITH OR WITHOUT FEVER  NAUSEA AND VOMITING THAT IS NOT CONTROLLED WITH YOUR NAUSEA MEDICATION  *UNUSUAL SHORTNESS OF BREATH  *UNUSUAL BRUISING OR BLEEDING  TENDERNESS IN MOUTH AND THROAT WITH OR WITHOUT PRESENCE OF ULCERS  *URINARY PROBLEMS  *BOWEL PROBLEMS  UNUSUAL RASH Items with * indicate a potential emergency and should be followed up as soon as possible.  Feel free to call the clinic you have any questions or concerns. The clinic phone number is [phone].  Please show the CHEMO ALERT CARD at check-in to the Emergency Department and triage nurse.    

## 2016-11-26 LAB — KAPPA/LAMBDA LIGHT CHAINS
Ig Kappa Free Light Chain: 168.5 mg/L — ABNORMAL HIGH (ref 3.3–19.4)
Ig Lambda Free Light Chain: 7.9 mg/L (ref 5.7–26.3)
Kappa/Lambda FluidC Ratio: 21.33 — ABNORMAL HIGH (ref 0.26–1.65)

## 2016-11-27 LAB — MULTIPLE MYELOMA PANEL, SERUM
ALBUMIN SERPL ELPH-MCNC: 3.4 g/dL (ref 2.9–4.4)
ALPHA 1: 0.3 g/dL (ref 0.0–0.4)
Albumin/Glob SerPl: 1.2 (ref 0.7–1.7)
Alpha2 Glob SerPl Elph-Mcnc: 0.9 g/dL (ref 0.4–1.0)
B-Globulin SerPl Elph-Mcnc: 1.3 g/dL (ref 0.7–1.3)
GAMMA GLOB SERPL ELPH-MCNC: 0.4 g/dL (ref 0.4–1.8)
GLOBULIN, TOTAL: 2.9 g/dL (ref 2.2–3.9)
IGA/IMMUNOGLOBULIN A, SERUM: 353 mg/dL (ref 64–422)
IgG, Qn, Serum: 506 mg/dL — ABNORMAL LOW (ref 700–1600)
IgM, Qn, Serum: 15 mg/dL — ABNORMAL LOW (ref 26–217)
M Protein SerPl Elph-Mcnc: 0.2 g/dL — ABNORMAL HIGH
TOTAL PROTEIN: 6.3 g/dL (ref 6.0–8.5)

## 2016-12-01 MED FILL — LISINOPRIL 40 MG TABLET: 40 | 30 days supply | Qty: 30 | Fill #2

## 2016-12-02 ENCOUNTER — Other Ambulatory Visit: Payer: Self-pay | Admitting: *Deleted

## 2016-12-02 MED ORDER — LENALIDOMIDE 5 MG PO CAPS: 5.0000 mg | ORAL_CAPSULE | Freq: Every day | ORAL | 0 refills | Status: DC

## 2016-12-09 ENCOUNTER — Telehealth: Payer: Self-pay | Admitting: *Deleted

## 2016-12-09 ENCOUNTER — Other Ambulatory Visit (HOSPITAL_BASED_OUTPATIENT_CLINIC_OR_DEPARTMENT_OTHER): Payer: Medicare HMO

## 2016-12-09 ENCOUNTER — Ambulatory Visit (HOSPITAL_BASED_OUTPATIENT_CLINIC_OR_DEPARTMENT_OTHER): Payer: Medicare HMO

## 2016-12-09 ENCOUNTER — Encounter: Payer: Self-pay | Admitting: Hematology and Oncology

## 2016-12-09 ENCOUNTER — Telehealth: Payer: Self-pay | Admitting: Hematology and Oncology

## 2016-12-09 ENCOUNTER — Ambulatory Visit (HOSPITAL_BASED_OUTPATIENT_CLINIC_OR_DEPARTMENT_OTHER): Payer: Medicare HMO | Admitting: Hematology and Oncology

## 2016-12-09 VITALS — BP 158/73 | HR 73 | Temp 98.2°F | Resp 18 | Ht 63.0 in | Wt 147.7 lb

## 2016-12-09 DIAGNOSIS — C9001 Multiple myeloma in remission: Secondary | ICD-10-CM

## 2016-12-09 DIAGNOSIS — R64 Cachexia: Secondary | ICD-10-CM | POA: Diagnosis not present

## 2016-12-09 DIAGNOSIS — Z5112 Encounter for antineoplastic immunotherapy: Secondary | ICD-10-CM

## 2016-12-09 DIAGNOSIS — G893 Neoplasm related pain (acute) (chronic): Secondary | ICD-10-CM

## 2016-12-09 DIAGNOSIS — C9000 Multiple myeloma not having achieved remission: Secondary | ICD-10-CM

## 2016-12-09 DIAGNOSIS — Z5189 Encounter for other specified aftercare: Secondary | ICD-10-CM | POA: Diagnosis not present

## 2016-12-09 DIAGNOSIS — Z7189 Other specified counseling: Secondary | ICD-10-CM

## 2016-12-09 LAB — CBC WITH DIFFERENTIAL/PLATELET
BASO%: 1.4 % (ref 0.0–2.0)
BASOS ABS: 0.1 10*3/uL (ref 0.0–0.1)
EOS%: 6.2 % (ref 0.0–7.0)
Eosinophils Absolute: 0.4 10*3/uL (ref 0.0–0.5)
HEMATOCRIT: 37.4 % (ref 34.8–46.6)
HEMOGLOBIN: 12.6 g/dL (ref 11.6–15.9)
LYMPH#: 1.4 10*3/uL (ref 0.9–3.3)
LYMPH%: 21.2 % (ref 14.0–49.7)
MCH: 31.3 pg (ref 25.1–34.0)
MCHC: 33.7 g/dL (ref 31.5–36.0)
MCV: 93.1 fL (ref 79.5–101.0)
MONO#: 0.9 10*3/uL (ref 0.1–0.9)
MONO%: 13 % (ref 0.0–14.0)
NEUT#: 3.9 10*3/uL (ref 1.5–6.5)
NEUT%: 58.2 % (ref 38.4–76.8)
Platelets: 169 10*3/uL (ref 145–400)
RBC: 4.02 10*6/uL (ref 3.70–5.45)
RDW: 14.7 % — AB (ref 11.2–14.5)
WBC: 6.7 10*3/uL (ref 3.9–10.3)

## 2016-12-09 LAB — COMPREHENSIVE METABOLIC PANEL
ALBUMIN: 3.4 g/dL — AB (ref 3.5–5.0)
ALK PHOS: 45 U/L (ref 40–150)
ALT: 16 U/L (ref 0–55)
AST: 17 U/L (ref 5–34)
Anion Gap: 10 mEq/L (ref 3–11)
BUN: 16.6 mg/dL (ref 7.0–26.0)
CALCIUM: 8.8 mg/dL (ref 8.4–10.4)
CO2: 23 mEq/L (ref 22–29)
CREATININE: 0.8 mg/dL (ref 0.6–1.1)
Chloride: 109 mEq/L (ref 98–109)
EGFR: 76 mL/min/{1.73_m2} — ABNORMAL LOW (ref >90)
Glucose: 97 mg/dl (ref 70–140)
Potassium: 3.8 mEq/L (ref 3.5–5.1)
Sodium: 143 mEq/L (ref 136–145)
TOTAL PROTEIN: 6.1 g/dL — AB (ref 6.4–8.3)
Total Bilirubin: 0.61 mg/dL (ref 0.20–1.20)

## 2016-12-09 MED ORDER — PROCHLORPERAZINE MALEATE 10 MG PO TABS
ORAL_TABLET | ORAL | Status: AC
  Filled 2016-12-09: qty 1

## 2016-12-09 MED ORDER — ZOLEDRONIC ACID 4 MG/5ML IV CONC
3.5000 mg | Freq: Once | INTRAVENOUS | Status: AC
  Administered 2016-12-09: 3.5 mg via INTRAVENOUS
  Filled 2016-12-09: qty 4.38

## 2016-12-09 MED ORDER — MORPHINE SULFATE ER 15 MG PO TBCR: 15.0000 mg | EXTENDED_RELEASE_TABLET | Freq: Two times a day (BID) | ORAL | 0 refills | Status: DC

## 2016-12-09 MED ORDER — PROCHLORPERAZINE MALEATE 10 MG PO TABS
10.0000 mg | ORAL_TABLET | Freq: Once | ORAL | Status: AC
  Administered 2016-12-09: 10 mg via ORAL

## 2016-12-09 MED ORDER — BORTEZOMIB CHEMO SQ INJECTION 3.5 MG (2.5MG/ML)
0.9750 mg/m2 | Freq: Once | INTRAMUSCULAR | Status: AC
  Administered 2016-12-09: 1.5 mg via SUBCUTANEOUS
  Filled 2016-12-09: qty 1.5

## 2016-12-09 MED ORDER — SODIUM CHLORIDE 0.9 % IV SOLN
Freq: Once | INTRAVENOUS | Status: AC
  Administered 2016-12-09: 10:00:00 via INTRAVENOUS

## 2016-12-09 MED FILL — OXYCODONE W/APAP 5/325 TAB: 5-325 | 22 days supply | Qty: 90 | Fill #0

## 2016-12-09 MED FILL — MORPHINE SULF ER 15 MG TAB: 15 | 30 days supply | Qty: 60 | Fill #0

## 2016-12-09 NOTE — Telephone Encounter (Signed)
Appointments scheduled per 1/23 LOS. Patient given AVS report and calendars with future scheduled appointments. °

## 2016-12-09 NOTE — Assessment & Plan Note (Signed)
She has gained a lot of weight since I initiated dexamethasone treatment I recommend continue the dose of dexamethasone at 2 mg daily

## 2016-12-09 NOTE — Assessment & Plan Note (Signed)
She resumed Revlimid without complications We will continue on Velcade every other week injection and Revlimid, 5 mg, 21 days on, 7 days off along with quarterly Zometa. She will continue acyclovir, dexamethasone, calcium, vitamin D and aspirin I will see her back in 4 weeks for toxicity review We discussed extensively about goals of care and ultimately she agreed to continue treatment

## 2016-12-09 NOTE — Assessment & Plan Note (Signed)
We discussed goals of care. For now, the patient feels well. We discussed the importance of advanced directives and appointment of dedicated medical healthcare power of attorney. Her sister brought paperwork signed for dedicated medical power of attorney For now, they want her to remain on aggressive treatment We discussed CODE STATUS They remain undecided about CODE STATUS For now, she is FULL CODE

## 2016-12-09 NOTE — Progress Notes (Signed)
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 Revlimid was stopped in July and resumed in October      04/01/2016 Adverse Reaction    Treatment was placed on hold temporarily due to neutropenia       INTERVAL HISTORY: Please see below for problem oriented charting. She is here with her sisters They brought signed copies of medical healthcare power of attorney They are undecided about CODE STATUS The patient has gained some weight since I saw her However, with recent reduced dose dexamethasone, her back pain is not under good control There is also question about compliance due to her poor memory, whether she is taking her medications correctly She has no reported recent infection or worsening neuropathy  REVIEW OF SYSTEMS:   Constitutional: Denies fevers, chills or abnormal weight loss Eyes: Denies blurriness of vision Ears, nose, mouth, throat, and face: Denies mucositis or sore throat Respiratory: Denies cough, dyspnea or wheezes Cardiovascular: Denies palpitation, chest discomfort or lower extremity swelling Gastrointestinal:  Denies nausea, heartburn or change in bowel habits Skin: Denies abnormal skin rashes Lymphatics: Denies new lymphadenopathy or easy bruising Neurological:Denies numbness, tingling or new  weaknesses Behavioral/Psych: Mood is stable, no new changes  All other systems were reviewed with the patient and are negative.  I have reviewed the past medical history, past surgical history, social history and family history with the patient and they are unchanged from previous note.  ALLERGIES:  is allergic to aspirin; pravastatin; and zocor [simvastatin].  MEDICATIONS:  Current Outpatient Prescriptions  Medication Sig Dispense Refill  . acyclovir (ZOVIRAX) 400 MG tablet Take 1 tablet (400 mg total) by mouth daily. 90 tablet 3  . dexamethasone (DECADRON) 4 MG tablet Take 1 tablet (4 mg total) by mouth daily. 60 tablet 1  . lenalidomide (REVLIMID) 5 MG capsule Take 1 capsule (5 mg total) by mouth daily. 21 days on and 7 days off. 21 capsule 0  . lisinopril (PRINIVIL,ZESTRIL) 40 MG tablet Take 1 tablet (40 mg total) by mouth daily. 30 tablet 11  . oxyCODONE-acetaminophen (ROXICET) 5-325 MG tablet Take 1 tablet by mouth every 6 (six) hours as needed for severe pain. 90 tablet 0  . terbinafine (LAMISIL) 250 MG tablet Take 1 tablet (250 mg total) by mouth daily. 30 tablet 2  . morphine (MS CONTIN) 15 MG 12 hr tablet Take 1 tablet (15 mg total) by mouth every 12 (twelve) hours. 60 tablet 0   No current facility-administered medications for this visit.     PHYSICAL EXAMINATION: ECOG PERFORMANCE STATUS: 1 - Symptomatic but completely ambulatory  Vitals:   12/09/16 0904  BP: (!) 158/73  Pulse: 73  Resp: 18  Temp: 98.2 F (36.8 C)   Filed Weights   12/09/16 0904  Weight: 147 lb  11.2 oz (67 kg)    GENERAL:alert, no distress and comfortable SKIN: skin color, texture, turgor are normal, no rashes or significant lesions EYES: normal, Conjunctiva are pink and non-injected, sclera clear Musculoskeletal:no cyanosis of digits and no clubbing  NEURO: alert & oriented x 3 with fluent speech, no focal motor/sensory deficits  LABORATORY DATA:  I have reviewed the data as listed     Component Value Date/Time   NA 143 12/09/2016 0846   K 3.8 12/09/2016 0846   CL 107 10/06/2016 0526   CO2 23 12/09/2016 0846   GLUCOSE 97 12/09/2016 0846   BUN 16.6 12/09/2016 0846   CREATININE 0.8 12/09/2016 0846   CALCIUM 8.8 12/09/2016 0846   PROT 6.1 (L) 12/09/2016 0846   ALBUMIN 3.4 (L) 12/09/2016 0846   AST 17 12/09/2016 0846   ALT 16 12/09/2016 0846   ALKPHOS 45 12/09/2016 0846   BILITOT 0.61 12/09/2016 0846   GFRNONAA >60 10/06/2016 0526   GFRNONAA 64 01/17/2016 0910   GFRAA >60 10/06/2016 0526   GFRAA 74 01/17/2016 0910    No results found for: SPEP, UPEP  Lab Results  Component Value Date   WBC 6.7 12/09/2016   NEUTROABS 3.9 12/09/2016   HGB 12.6 12/09/2016   HCT 37.4 12/09/2016   MCV 93.1 12/09/2016   PLT 169 12/09/2016      Chemistry      Component Value Date/Time   NA 143 12/09/2016 0846   K 3.8 12/09/2016 0846   CL 107 10/06/2016 0526   CO2 23 12/09/2016 0846   BUN 16.6 12/09/2016 0846   CREATININE 0.8 12/09/2016 0846      Component Value Date/Time   CALCIUM 8.8 12/09/2016 0846   ALKPHOS 45 12/09/2016 0846   AST 17 12/09/2016 0846   ALT 16 12/09/2016 0846   BILITOT 0.61 12/09/2016 0846     ASSESSMENT & PLAN:  Multiple myeloma in remission ([place]) She resumed Revlimid without complications We will continue on Velcade every other week injection and Revlimid, 5 mg, 21 days on, 7 days off along with quarterly Zometa. She will continue acyclovir, dexamethasone, calcium, vitamin D and aspirin I will see her back in 4 weeks for toxicity review We discussed extensively about goals of care and ultimately she agreed to continue treatment  Cancer associated pain Her pain is not under good control. We discussed chronic cancer pain management I plan to add MS Contin follow acting pain management I will consult home care nursing staff to do regular visits to make sure she is compliant taking her medications as instructed I warned her risk of  constipation and sedation with pain medicine  Goals of care, counseling/discussion We discussed goals of care. For now, the patient feels well. We discussed the importance of advanced directives and appointment of dedicated medical healthcare power of attorney. Her sister brought paperwork signed for dedicated medical power of attorney For now, they want her to remain on aggressive treatment We discussed CODE STATUS They remain undecided about CODE STATUS For now, she is FULL CODE   Malignant cachexia ([place]) She has gained a lot of weight since I initiated dexamethasone treatment I recommend continue the dose of dexamethasone at 2 mg daily    Orders Placed This Encounter  Procedures  . Kappa/lambda light chains    Standing Status:   Future    Standing Expiration Date:   01/13/2018  . Multiple Myeloma Panel (SPEP&IFE w/QIG)    Standing Status:   Future    Standing  Expiration Date:   01/13/2018   All questions were answered. The patient knows to call the clinic with any problems, questions or concerns. No barriers to learning was detected. I spent 25 minutes counseling the patient face to face. The total time spent in the appointment was 40 minutes and more than 50% was on counseling and review of test results     Heath Lark, MD 12/09/2016 10:38 AM

## 2016-12-09 NOTE — Assessment & Plan Note (Addendum)
Her pain is not under good control. We discussed chronic cancer pain management I plan to add MS Contin follow acting pain management I will consult home care nursing staff to do regular visits to make sure she is compliant taking her medications as instructed I warned her risk of constipation and sedation with pain medicine

## 2016-12-09 NOTE — Patient Instructions (Signed)
[facility] Discharge Instructions for Patients Receiving Chemotherapy  Today you received the following chemotherapy agents Velcade  To help prevent nausea and vomiting after your treatment, we encourage you to take your nausea medication as prescribed.   If you develop nausea and vomiting that is not controlled by your nausea medication, call the clinic.   BELOW ARE SYMPTOMS THAT SHOULD BE REPORTED IMMEDIATELY:  *FEVER GREATER THAN 100.5 F  *CHILLS WITH OR WITHOUT FEVER  NAUSEA AND VOMITING THAT IS NOT CONTROLLED WITH YOUR NAUSEA MEDICATION  *UNUSUAL SHORTNESS OF BREATH  *UNUSUAL BRUISING OR BLEEDING  TENDERNESS IN MOUTH AND THROAT WITH OR WITHOUT PRESENCE OF ULCERS  *URINARY PROBLEMS  *BOWEL PROBLEMS  UNUSUAL RASH Items with * indicate a potential emergency and should be followed up as soon as possible.  Feel free to call the clinic you have any questions or concerns. The clinic phone number is [phone].  Please show the [facility] at check-in to the Emergency Department and triage nurse.  Zoledronic Acid injection (Hypercalcemia, Oncology) (Zometa) What is this medicine? ZOLEDRONIC ACID (ZOE le dron ik AS id) lowers the amount of calcium loss from bone. It is used to treat too much calcium in your blood from cancer. It is also used to prevent complications of cancer that has spread to the bone. This medicine may be used for other purposes; ask your health care provider or pharmacist if you have questions. COMMON BRAND NAME(S): Zometa What should I tell my health care provider before I take this medicine? They need to know if you have any of these conditions: -aspirin-sensitive asthma -cancer, especially if you are receiving medicines used to treat cancer -dental disease or wear dentures -infection -kidney disease -receiving corticosteroids like dexamethasone or prednisone -an unusual or allergic reaction to zoledronic acid, other medicines,  foods, dyes, or preservatives -pregnant or trying to get pregnant -breast-feeding How should I use this medicine? This medicine is for infusion into a vein. It is given by a health care professional in a hospital or clinic setting. Talk to your pediatrician regarding the use of this medicine in children. Special care may be needed. Overdosage: If you think you have taken too much of this medicine contact a poison control center or emergency room at once. NOTE: This medicine is only for you. Do not share this medicine with others. What if I miss a dose? It is important not to miss your dose. Call your doctor or health care professional if you are unable to keep an appointment. What may interact with this medicine? -certain antibiotics given by injection -NSAIDs, medicines for pain and inflammation, like ibuprofen or naproxen -some diuretics like bumetanide, furosemide -teriparatide -thalidomide This list may not describe all possible interactions. Give your health care provider a list of all the medicines, herbs, non-prescription drugs, or dietary supplements you use. Also tell them if you smoke, drink alcohol, or use illegal drugs. Some items may interact with your medicine. What should I watch for while using this medicine? Visit your doctor or health care professional for regular checkups. It may be some time before you see the benefit from this medicine. Do not stop taking your medicine unless your doctor tells you to. Your doctor may order blood tests or other tests to see how you are doing. Women should inform their doctor if they wish to become pregnant or think they might be pregnant. There is a potential for serious side effects to an unborn child. Talk to  your health care professional or pharmacist for more information. You should make sure that you get enough calcium and vitamin D while you are taking this medicine. Discuss the foods you eat and the vitamins you take with your health  care professional. Some people who take this medicine have severe bone, joint, and/or muscle pain. This medicine may also increase your risk for jaw problems or a broken thigh bone. Tell your doctor right away if you have severe pain in your jaw, bones, joints, or muscles. Tell your doctor if you have any pain that does not go away or that gets worse. Tell your dentist and dental surgeon that you are taking this medicine. You should not have major dental surgery while on this medicine. See your dentist to have a dental exam and fix any dental problems before starting this medicine. Take good care of your teeth while on this medicine. Make sure you see your dentist for regular follow-up appointments. What side effects may I notice from receiving this medicine? Side effects that you should report to your doctor or health care professional as soon as possible: -allergic reactions like skin rash, itching or hives, swelling of the face, lips, or tongue -anxiety, confusion, or depression -breathing problems -changes in vision -eye pain -feeling faint or lightheaded, falls -jaw pain, especially after dental work -mouth sores -muscle cramps, stiffness, or weakness -redness, blistering, peeling or loosening of the skin, including inside the mouth -trouble passing urine or change in the amount of urine Side effects that usually do not require medical attention (report to your doctor or health care professional if they continue or are bothersome): -bone, joint, or muscle pain -constipation -diarrhea -fever -hair loss -irritation at site where injected -loss of appetite -nausea, vomiting -stomach upset -trouble sleeping -trouble swallowing -weak or tired This list may not describe all possible side effects. Call your doctor for medical advice about side effects. You may report side effects to FDA at 1-800-FDA-1088. Where should I keep my medicine? This drug is given in a hospital or clinic and  will not be stored at home. NOTE: This sheet is a summary. It may not cover all possible information. If you have questions about this medicine, talk to your doctor, pharmacist, or health care provider.  2017 Elsevier/Gold Standard (2014-04-01 14:19:39)

## 2016-12-09 NOTE — Telephone Encounter (Signed)
Call from Shelby with [facility].  She confirmed they received referral.

## 2016-12-09 NOTE — Telephone Encounter (Signed)
Per 1/23 LOS and staff message I have scheduled appts and gave calendar

## 2016-12-09 NOTE — Telephone Encounter (Signed)
Home Health Referral made to [facility] for home nursing to monitor pt's condition and assist with medication management and compliance.  LVM for Santiago Glad w/ [facility] informing of new referral.

## 2016-12-10 DIAGNOSIS — I10 Essential (primary) hypertension: Secondary | ICD-10-CM | POA: Diagnosis not present

## 2016-12-10 DIAGNOSIS — M5136 Other intervertebral disc degeneration, lumbar region: Secondary | ICD-10-CM | POA: Diagnosis not present

## 2016-12-10 DIAGNOSIS — M353 Polymyalgia rheumatica: Secondary | ICD-10-CM | POA: Diagnosis not present

## 2016-12-10 DIAGNOSIS — Z7952 Long term (current) use of systemic steroids: Secondary | ICD-10-CM | POA: Diagnosis not present

## 2016-12-10 DIAGNOSIS — C9001 Multiple myeloma in remission: Secondary | ICD-10-CM | POA: Diagnosis not present

## 2016-12-12 DIAGNOSIS — C9001 Multiple myeloma in remission: Secondary | ICD-10-CM | POA: Diagnosis not present

## 2016-12-12 DIAGNOSIS — M5136 Other intervertebral disc degeneration, lumbar region: Secondary | ICD-10-CM | POA: Diagnosis not present

## 2016-12-12 DIAGNOSIS — I10 Essential (primary) hypertension: Secondary | ICD-10-CM | POA: Diagnosis not present

## 2016-12-12 DIAGNOSIS — M353 Polymyalgia rheumatica: Secondary | ICD-10-CM | POA: Diagnosis not present

## 2016-12-12 DIAGNOSIS — Z7952 Long term (current) use of systemic steroids: Secondary | ICD-10-CM | POA: Diagnosis not present

## 2016-12-17 DIAGNOSIS — Z7952 Long term (current) use of systemic steroids: Secondary | ICD-10-CM | POA: Diagnosis not present

## 2016-12-17 DIAGNOSIS — M5136 Other intervertebral disc degeneration, lumbar region: Secondary | ICD-10-CM | POA: Diagnosis not present

## 2016-12-17 DIAGNOSIS — C9001 Multiple myeloma in remission: Secondary | ICD-10-CM | POA: Diagnosis not present

## 2016-12-17 DIAGNOSIS — M353 Polymyalgia rheumatica: Secondary | ICD-10-CM | POA: Diagnosis not present

## 2016-12-17 DIAGNOSIS — I10 Essential (primary) hypertension: Secondary | ICD-10-CM | POA: Diagnosis not present

## 2016-12-19 DIAGNOSIS — M353 Polymyalgia rheumatica: Secondary | ICD-10-CM | POA: Diagnosis not present

## 2016-12-19 DIAGNOSIS — M5136 Other intervertebral disc degeneration, lumbar region: Secondary | ICD-10-CM | POA: Diagnosis not present

## 2016-12-19 DIAGNOSIS — C9001 Multiple myeloma in remission: Secondary | ICD-10-CM | POA: Diagnosis not present

## 2016-12-19 DIAGNOSIS — Z7952 Long term (current) use of systemic steroids: Secondary | ICD-10-CM | POA: Diagnosis not present

## 2016-12-19 DIAGNOSIS — I10 Essential (primary) hypertension: Secondary | ICD-10-CM | POA: Diagnosis not present

## 2016-12-22 MED FILL — TERBINAFINE HCL 250 MG TAB: 250 | 30 days supply | Qty: 30 | Fill #1

## 2016-12-23 ENCOUNTER — Ambulatory Visit (HOSPITAL_BASED_OUTPATIENT_CLINIC_OR_DEPARTMENT_OTHER): Payer: Medicare HMO

## 2016-12-23 ENCOUNTER — Other Ambulatory Visit (HOSPITAL_BASED_OUTPATIENT_CLINIC_OR_DEPARTMENT_OTHER): Payer: Medicare HMO

## 2016-12-23 VITALS — BP 120/66 | HR 66 | Temp 97.8°F | Resp 12

## 2016-12-23 DIAGNOSIS — C9001 Multiple myeloma in remission: Secondary | ICD-10-CM

## 2016-12-23 DIAGNOSIS — Z5112 Encounter for antineoplastic immunotherapy: Secondary | ICD-10-CM | POA: Diagnosis not present

## 2016-12-23 LAB — COMPREHENSIVE METABOLIC PANEL
ALBUMIN: 3.8 g/dL (ref 3.5–5.0)
ALK PHOS: 54 U/L (ref 40–150)
ALT: 19 U/L (ref 0–55)
ANION GAP: 13 meq/L — AB (ref 3–11)
AST: 18 U/L (ref 5–34)
BUN: 28.3 mg/dL — ABNORMAL HIGH (ref 7.0–26.0)
CALCIUM: 9.9 mg/dL (ref 8.4–10.4)
CO2: 27 mEq/L (ref 22–29)
Chloride: 104 mEq/L (ref 98–109)
Creatinine: 1 mg/dL (ref 0.6–1.1)
EGFR: 51 mL/min/{1.73_m2} — ABNORMAL LOW (ref >90)
GLUCOSE: 79 mg/dL (ref 70–140)
POTASSIUM: 3.9 meq/L (ref 3.5–5.1)
SODIUM: 144 meq/L (ref 136–145)
Total Bilirubin: 0.5 mg/dL (ref 0.20–1.20)
Total Protein: 7.1 g/dL (ref 6.4–8.3)

## 2016-12-23 LAB — CBC WITH DIFFERENTIAL/PLATELET
BASO%: 0.4 % (ref 0.0–2.0)
Basophils Absolute: 0 10*3/uL (ref 0.0–0.1)
EOS%: 4.9 % (ref 0.0–7.0)
Eosinophils Absolute: 0.3 10*3/uL (ref 0.0–0.5)
HCT: 40.9 % (ref 34.8–46.6)
HEMOGLOBIN: 13.6 g/dL (ref 11.6–15.9)
LYMPH%: 31.5 % (ref 14.0–49.7)
MCH: 31.2 pg (ref 25.1–34.0)
MCHC: 33.2 g/dL (ref 31.5–36.0)
MCV: 93.9 fL (ref 79.5–101.0)
MONO#: 0.3 10*3/uL (ref 0.1–0.9)
MONO%: 5.1 % (ref 0.0–14.0)
NEUT#: 3.2 10*3/uL (ref 1.5–6.5)
NEUT%: 58.1 % (ref 38.4–76.8)
Platelets: 178 10*3/uL (ref 145–400)
RBC: 4.36 10*6/uL (ref 3.70–5.45)
RDW: 15.2 % — AB (ref 11.2–14.5)
WBC: 5.5 10*3/uL (ref 3.9–10.3)
lymph#: 1.7 10*3/uL (ref 0.9–3.3)

## 2016-12-23 MED ORDER — PROCHLORPERAZINE MALEATE 10 MG PO TABS
ORAL_TABLET | ORAL | Status: AC
  Filled 2016-12-23: qty 1

## 2016-12-23 MED ORDER — BORTEZOMIB CHEMO SQ INJECTION 3.5 MG (2.5MG/ML)
0.9750 mg/m2 | Freq: Once | INTRAMUSCULAR | Status: AC
  Administered 2016-12-23: 1.5 mg via SUBCUTANEOUS
  Filled 2016-12-23: qty 1.5

## 2016-12-23 MED ORDER — PROCHLORPERAZINE MALEATE 10 MG PO TABS
10.0000 mg | ORAL_TABLET | Freq: Once | ORAL | Status: AC
  Administered 2016-12-23: 10 mg via ORAL

## 2016-12-23 NOTE — Patient Instructions (Signed)
[facility] Discharge Instructions for Patients Receiving Chemotherapy  Today you received the following chemotherapy agents Velcade  To help prevent nausea and vomiting after your treatment, we encourage you to take your nausea medication    If you develop nausea and vomiting that is not controlled by your nausea medication, call the clinic.   BELOW ARE SYMPTOMS THAT SHOULD BE REPORTED IMMEDIATELY:  *FEVER GREATER THAN 100.5 F  *CHILLS WITH OR WITHOUT FEVER  NAUSEA AND VOMITING THAT IS NOT CONTROLLED WITH YOUR NAUSEA MEDICATION  *UNUSUAL SHORTNESS OF BREATH  *UNUSUAL BRUISING OR BLEEDING  TENDERNESS IN MOUTH AND THROAT WITH OR WITHOUT PRESENCE OF ULCERS  *URINARY PROBLEMS  *BOWEL PROBLEMS  UNUSUAL RASH Items with * indicate a potential emergency and should be followed up as soon as possible.  Feel free to call the clinic you have any questions or concerns. The clinic phone number is [phone].  Please show the CHEMO ALERT CARD at check-in to the Emergency Department and triage nurse.   

## 2016-12-24 DIAGNOSIS — Z7952 Long term (current) use of systemic steroids: Secondary | ICD-10-CM | POA: Diagnosis not present

## 2016-12-24 DIAGNOSIS — M5136 Other intervertebral disc degeneration, lumbar region: Secondary | ICD-10-CM | POA: Diagnosis not present

## 2016-12-24 DIAGNOSIS — I10 Essential (primary) hypertension: Secondary | ICD-10-CM | POA: Diagnosis not present

## 2016-12-24 DIAGNOSIS — C9001 Multiple myeloma in remission: Secondary | ICD-10-CM | POA: Diagnosis not present

## 2016-12-24 DIAGNOSIS — M353 Polymyalgia rheumatica: Secondary | ICD-10-CM | POA: Diagnosis not present

## 2016-12-24 LAB — KAPPA/LAMBDA LIGHT CHAINS
IG LAMBDA FREE LIGHT CHAIN: 6.9 mg/L (ref 5.7–26.3)
Ig Kappa Free Light Chain: 149.8 mg/L — ABNORMAL HIGH (ref 3.3–19.4)
KAPPA/LAMBDA FLC RATIO: 21.71 — AB (ref 0.26–1.65)

## 2016-12-26 ENCOUNTER — Other Ambulatory Visit: Payer: Self-pay | Admitting: *Deleted

## 2016-12-26 DIAGNOSIS — C9001 Multiple myeloma in remission: Secondary | ICD-10-CM | POA: Diagnosis not present

## 2016-12-26 DIAGNOSIS — I10 Essential (primary) hypertension: Secondary | ICD-10-CM | POA: Diagnosis not present

## 2016-12-26 DIAGNOSIS — Z7952 Long term (current) use of systemic steroids: Secondary | ICD-10-CM | POA: Diagnosis not present

## 2016-12-26 DIAGNOSIS — M5136 Other intervertebral disc degeneration, lumbar region: Secondary | ICD-10-CM | POA: Diagnosis not present

## 2016-12-26 DIAGNOSIS — M353 Polymyalgia rheumatica: Secondary | ICD-10-CM | POA: Diagnosis not present

## 2016-12-26 LAB — MULTIPLE MYELOMA PANEL, SERUM
Albumin SerPl Elph-Mcnc: 3.7 g/dL (ref 2.9–4.4)
Albumin/Glob SerPl: 1.3 (ref 0.7–1.7)
Alpha 1: 0.3 g/dL (ref 0.0–0.4)
Alpha2 Glob SerPl Elph-Mcnc: 0.9 g/dL (ref 0.4–1.0)
B-GLOBULIN SERPL ELPH-MCNC: 1.3 g/dL (ref 0.7–1.3)
GAMMA GLOB SERPL ELPH-MCNC: 0.5 g/dL (ref 0.4–1.8)
GLOBULIN, TOTAL: 3 g/dL (ref 2.2–3.9)
IGA/IMMUNOGLOBULIN A, SERUM: 319 mg/dL (ref 64–422)
IgG, Qn, Serum: 441 mg/dL — ABNORMAL LOW (ref 700–1600)
IgM, Qn, Serum: 13 mg/dL — ABNORMAL LOW (ref 26–217)
M PROTEIN SERPL ELPH-MCNC: 0.3 g/dL — AB
Total Protein: 6.7 g/dL (ref 6.0–8.5)

## 2016-12-26 MED ORDER — LENALIDOMIDE 5 MG PO CAPS: 5.0000 mg | ORAL_CAPSULE | Freq: Every day | ORAL | 0 refills | Status: DC

## 2016-12-29 MED FILL — LISINOPRIL 40 MG TABLET: 40 | 30 days supply | Qty: 30 | Fill #3

## 2017-01-02 DIAGNOSIS — M353 Polymyalgia rheumatica: Secondary | ICD-10-CM | POA: Diagnosis not present

## 2017-01-02 DIAGNOSIS — M5136 Other intervertebral disc degeneration, lumbar region: Secondary | ICD-10-CM | POA: Diagnosis not present

## 2017-01-02 DIAGNOSIS — I10 Essential (primary) hypertension: Secondary | ICD-10-CM | POA: Diagnosis not present

## 2017-01-02 DIAGNOSIS — C9001 Multiple myeloma in remission: Secondary | ICD-10-CM | POA: Diagnosis not present

## 2017-01-02 DIAGNOSIS — Z7952 Long term (current) use of systemic steroids: Secondary | ICD-10-CM | POA: Diagnosis not present

## 2017-01-06 ENCOUNTER — Ambulatory Visit (HOSPITAL_BASED_OUTPATIENT_CLINIC_OR_DEPARTMENT_OTHER): Payer: Medicare HMO | Admitting: Hematology and Oncology

## 2017-01-06 ENCOUNTER — Other Ambulatory Visit (HOSPITAL_BASED_OUTPATIENT_CLINIC_OR_DEPARTMENT_OTHER): Payer: Medicare HMO

## 2017-01-06 ENCOUNTER — Telehealth: Payer: Self-pay | Admitting: Hematology and Oncology

## 2017-01-06 ENCOUNTER — Encounter: Payer: Self-pay | Admitting: Hematology and Oncology

## 2017-01-06 ENCOUNTER — Telehealth: Payer: Self-pay | Admitting: *Deleted

## 2017-01-06 ENCOUNTER — Ambulatory Visit (HOSPITAL_BASED_OUTPATIENT_CLINIC_OR_DEPARTMENT_OTHER): Payer: Medicare HMO

## 2017-01-06 VITALS — BP 142/76 | HR 74 | Temp 97.6°F | Resp 18 | Ht 63.0 in | Wt 145.5 lb

## 2017-01-06 DIAGNOSIS — C9001 Multiple myeloma in remission: Secondary | ICD-10-CM

## 2017-01-06 DIAGNOSIS — Z7189 Other specified counseling: Secondary | ICD-10-CM

## 2017-01-06 DIAGNOSIS — Z5112 Encounter for antineoplastic immunotherapy: Secondary | ICD-10-CM | POA: Diagnosis not present

## 2017-01-06 DIAGNOSIS — G893 Neoplasm related pain (acute) (chronic): Secondary | ICD-10-CM | POA: Diagnosis not present

## 2017-01-06 LAB — CBC WITH DIFFERENTIAL/PLATELET
BASO%: 0.7 % (ref 0.0–2.0)
Basophils Absolute: 0.1 10*3/uL (ref 0.0–0.1)
EOS%: 2.4 % (ref 0.0–7.0)
Eosinophils Absolute: 0.2 10*3/uL (ref 0.0–0.5)
HCT: 39.7 % (ref 34.8–46.6)
HEMOGLOBIN: 13 g/dL (ref 11.6–15.9)
LYMPH%: 20.7 % (ref 14.0–49.7)
MCH: 30.7 pg (ref 25.1–34.0)
MCHC: 32.7 g/dL (ref 31.5–36.0)
MCV: 93.6 fL (ref 79.5–101.0)
MONO#: 0.9 10*3/uL (ref 0.1–0.9)
MONO%: 12.9 % (ref 0.0–14.0)
NEUT#: 4.3 10*3/uL (ref 1.5–6.5)
NEUT%: 63.3 % (ref 38.4–76.8)
PLATELETS: 155 10*3/uL (ref 145–400)
RBC: 4.24 10*6/uL (ref 3.70–5.45)
RDW: 15.1 % — AB (ref 11.2–14.5)
WBC: 6.8 10*3/uL (ref 3.9–10.3)
lymph#: 1.4 10*3/uL (ref 0.9–3.3)

## 2017-01-06 LAB — COMPREHENSIVE METABOLIC PANEL
ALBUMIN: 3.7 g/dL (ref 3.5–5.0)
ALT: 23 U/L (ref 0–55)
AST: 20 U/L (ref 5–34)
Alkaline Phosphatase: 48 U/L (ref 40–150)
Anion Gap: 11 mEq/L (ref 3–11)
BILIRUBIN TOTAL: 0.4 mg/dL (ref 0.20–1.20)
BUN: 15.4 mg/dL (ref 7.0–26.0)
CO2: 25 meq/L (ref 22–29)
CREATININE: 0.8 mg/dL (ref 0.6–1.1)
Calcium: 9.2 mg/dL (ref 8.4–10.4)
Chloride: 108 mEq/L (ref 98–109)
EGFR: 76 mL/min/{1.73_m2} — ABNORMAL LOW (ref >90)
GLUCOSE: 88 mg/dL (ref 70–140)
Potassium: 3.8 mEq/L (ref 3.5–5.1)
SODIUM: 144 meq/L (ref 136–145)
TOTAL PROTEIN: 6.6 g/dL (ref 6.4–8.3)

## 2017-01-06 MED ORDER — PROCHLORPERAZINE MALEATE 10 MG PO TABS
ORAL_TABLET | ORAL | Status: AC
  Filled 2017-01-06: qty 1

## 2017-01-06 MED ORDER — PROCHLORPERAZINE MALEATE 10 MG PO TABS
10.0000 mg | ORAL_TABLET | Freq: Once | ORAL | Status: AC
  Administered 2017-01-06: 10 mg via ORAL

## 2017-01-06 MED ORDER — BORTEZOMIB CHEMO SQ INJECTION 3.5 MG (2.5MG/ML)
0.9750 mg/m2 | Freq: Once | INTRAMUSCULAR | Status: AC
  Administered 2017-01-06: 1.5 mg via SUBCUTANEOUS
  Filled 2017-01-06: qty 1.5

## 2017-01-06 NOTE — Telephone Encounter (Signed)
Message sent to chemo scheduler to be added per 01/06/17 los. Appointments scheduled per 01/06/17 los. Patient was given a copy of the AVS report and appointment schedule per 01/06/17 los.

## 2017-01-06 NOTE — Progress Notes (Signed)
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 Revlimid was stopped in July and resumed in October      04/01/2016 Adverse Reaction    Treatment was placed on hold temporarily due to neutropenia       INTERVAL HISTORY: Please see below for problem oriented charting. She returns with her sisters for further follow-up. She tolerated recent chemotherapy well. Family members has not started MS Contin for pain. Her pain is under control. Appetite is stable. There were no reported recent infection. She denies neuropathy or skin rash from Velcade.  REVIEW OF SYSTEMS:   Constitutional: Denies fevers, chills or abnormal weight loss Eyes: Denies blurriness of vision Ears, nose, mouth, throat, and face: Denies mucositis or sore throat Respiratory: Denies cough, dyspnea or wheezes Cardiovascular: Denies palpitation, chest discomfort or lower extremity swelling Gastrointestinal:  Denies nausea, heartburn or change in bowel habits Skin: Denies abnormal skin rashes Lymphatics: Denies new lymphadenopathy or easy bruising Neurological:Denies numbness, tingling or new weaknesses Behavioral/Psych: Mood is stable, no new changes  All other systems were reviewed with the patient and are negative.  I have reviewed the past medical history, past surgical  history, social history and family history with the patient and they are unchanged from previous note.  ALLERGIES:  is allergic to aspirin; pravastatin; and zocor [simvastatin].  MEDICATIONS:  Current Outpatient Prescriptions  Medication Sig Dispense Refill  . acyclovir (ZOVIRAX) 400 MG tablet Take 1 tablet (400 mg total) by mouth daily. 90 tablet 3  . dexamethasone (DECADRON) 4 MG tablet Take 1 tablet (4 mg total) by mouth daily. 60 tablet 1  . lenalidomide (REVLIMID) 5 MG capsule Take 1 capsule (5 mg total) by mouth daily. 21 days on and 7 days off. 21 capsule 0  . lisinopril (PRINIVIL,ZESTRIL) 40 MG tablet Take 1 tablet (40 mg total) by mouth daily. 30 tablet 11  . oxyCODONE-acetaminophen (ROXICET) 5-325 MG tablet Take 1 tablet by mouth every 6 (six) hours as needed for severe pain. 90 tablet 0  . terbinafine (LAMISIL) 250 MG tablet Take 1 tablet (250 mg total) by mouth daily. 30 tablet 2  . morphine (MS CONTIN) 15 MG 12 hr tablet Take 1 tablet (15 mg total) by mouth every 12 (twelve) hours. (Patient not taking: Reported on 01/06/2017) 60 tablet 0   No current facility-administered medications for this visit.     PHYSICAL EXAMINATION: ECOG PERFORMANCE STATUS: 1 - Symptomatic but completely ambulatory  Vitals:   01/06/17 1034  BP: (!) 142/76  Pulse: 74  Resp: 18  Temp: 97.6 F (36.4 C)   Filed Weights   01/06/17 1034  Weight: 145 lb 8 oz (66 kg)    GENERAL:alert, no distress and comfortable SKIN: skin color, texture, turgor are normal, no rashes or significant  lesions EYES: normal, Conjunctiva are pink and non-injected, sclera clear OROPHARYNX:no exudate, no erythema and lips, buccal mucosa, and tongue normal  NECK: supple, thyroid normal size, non-tender, without nodularity LYMPH:  no palpable lymphadenopathy in the cervical, axillary or inguinal LUNGS: clear to auscultation and percussion with normal breathing effort HEART: regular rate & rhythm and no murmurs and no lower  extremity edema ABDOMEN:abdomen soft, non-tender and normal bowel sounds Musculoskeletal:no cyanosis of digits and no clubbing  NEURO: alert & oriented x 3 with fluent speech, no focal motor/sensory deficits.  She appears to have memory impairment  LABORATORY DATA:  I have reviewed the data as listed    Component Value Date/Time   NA 144 01/06/2017 1020   K 3.8 01/06/2017 1020   CL 107 10/06/2016 0526   CO2 25 01/06/2017 1020   GLUCOSE 88 01/06/2017 1020   BUN 15.4 01/06/2017 1020   CREATININE 0.8 01/06/2017 1020   CALCIUM 9.2 01/06/2017 1020   PROT 6.6 01/06/2017 1020   ALBUMIN 3.7 01/06/2017 1020   AST 20 01/06/2017 1020   ALT 23 01/06/2017 1020   ALKPHOS 48 01/06/2017 1020   BILITOT 0.40 01/06/2017 1020   GFRNONAA >60 10/06/2016 0526   GFRNONAA 64 01/17/2016 0910   GFRAA >60 10/06/2016 0526   GFRAA 74 01/17/2016 0910    No results found for: SPEP, UPEP  Lab Results  Component Value Date   WBC 6.8 01/06/2017   NEUTROABS 4.3 01/06/2017   HGB 13.0 01/06/2017   HCT 39.7 01/06/2017   MCV 93.6 01/06/2017   PLT 155 01/06/2017      Chemistry      Component Value Date/Time   NA 144 01/06/2017 1020   K 3.8 01/06/2017 1020   CL 107 10/06/2016 0526   CO2 25 01/06/2017 1020   BUN 15.4 01/06/2017 1020   CREATININE 0.8 01/06/2017 1020      Component Value Date/Time   CALCIUM 9.2 01/06/2017 1020   ALKPHOS 48 01/06/2017 1020   AST 20 01/06/2017 1020   ALT 23 01/06/2017 1020   BILITOT 0.40 01/06/2017 1020       ASSESSMENT & PLAN:  Multiple myeloma in remission ([place]) She resumed Revlimid without complications 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 She will continue acyclovir, dexamethasone, calcium, vitamin D and aspirin I will see her back in 4 weeks for toxicity review We discussed extensively about goals of care  and ultimately she agreed to continue treatment  Cancer associated pain Her pain is not under good control. We discussed chronic cancer pain management I plan to add MS Contin follow acting pain management So far, she has not needed anything more apart from the prescribed oxycodone. I warned her risk of constipation and sedation with pain medicine  Goals of care, counseling/discussion We discussed goals of care. For now, the patient feels well. We discussed the importance of advanced directives and appointment of dedicated medical healthcare power of attorney. Her sister brought paperwork signed for dedicated medical power of attorney For now, they want her to remain on aggressive treatment We discussed CODE STATUS For now, she is FULL CODE    Orders Placed This Encounter  Procedures  . Kappa/lambda light chains    Standing Status:   Future    Standing Expiration Date:   02/10/2018  . Multiple Myeloma Panel (SPEP&IFE w/QIG)  Standing Status:   Future    Standing Expiration Date:   02/10/2018   All questions were answered. The patient knows to call the clinic with any problems, questions or concerns. No barriers to learning was detected. I spent 15 minutes counseling the patient face to face. The total time spent in the appointment was 20 minutes and more than 50% was on counseling and review of test results     Heath Lark, MD 01/06/2017 1:54 PM

## 2017-01-06 NOTE — Assessment & Plan Note (Signed)
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 She will continue acyclovir, dexamethasone, calcium, vitamin D and aspirin I will see her back in 4 weeks for toxicity review We discussed extensively about goals of care and ultimately she agreed to continue treatment

## 2017-01-06 NOTE — Assessment & Plan Note (Signed)
We discussed goals of care. For now, the patient feels well. We discussed the importance of advanced directives and appointment of dedicated medical healthcare power of attorney. Her sister brought paperwork signed for dedicated medical power of attorney For now, they want her to remain on aggressive treatment We discussed CODE STATUS For now, she is FULL CODE

## 2017-01-06 NOTE — Patient Instructions (Signed)
[facility] Discharge Instructions for Patients Receiving Chemotherapy  Today you received the following chemotherapy agents Velcade  To help prevent nausea and vomiting after your treatment, we encourage you to take your nausea medication    If you develop nausea and vomiting that is not controlled by your nausea medication, call the clinic.   BELOW ARE SYMPTOMS THAT SHOULD BE REPORTED IMMEDIATELY:  *FEVER GREATER THAN 100.5 F  *CHILLS WITH OR WITHOUT FEVER  NAUSEA AND VOMITING THAT IS NOT CONTROLLED WITH YOUR NAUSEA MEDICATION  *UNUSUAL SHORTNESS OF BREATH  *UNUSUAL BRUISING OR BLEEDING  TENDERNESS IN MOUTH AND THROAT WITH OR WITHOUT PRESENCE OF ULCERS  *URINARY PROBLEMS  *BOWEL PROBLEMS  UNUSUAL RASH Items with * indicate a potential emergency and should be followed up as soon as possible.  Feel free to call the clinic you have any questions or concerns. The clinic phone number is [phone].  Please show the CHEMO ALERT CARD at check-in to the Emergency Department and triage nurse.   

## 2017-01-06 NOTE — Assessment & Plan Note (Signed)
Her pain is not under good control. We discussed chronic cancer pain management I plan to add MS Contin follow acting pain management So far, she has not needed anything more apart from the prescribed oxycodone. I warned her risk of constipation and sedation with pain medicine

## 2017-01-06 NOTE — Telephone Encounter (Signed)
Per 2/20 LOS and staff message I have scheduled appt. patient to receive schedule with AVS

## 2017-01-07 DIAGNOSIS — M5136 Other intervertebral disc degeneration, lumbar region: Secondary | ICD-10-CM | POA: Diagnosis not present

## 2017-01-07 DIAGNOSIS — M353 Polymyalgia rheumatica: Secondary | ICD-10-CM | POA: Diagnosis not present

## 2017-01-07 DIAGNOSIS — I10 Essential (primary) hypertension: Secondary | ICD-10-CM | POA: Diagnosis not present

## 2017-01-07 DIAGNOSIS — Z7952 Long term (current) use of systemic steroids: Secondary | ICD-10-CM | POA: Diagnosis not present

## 2017-01-07 DIAGNOSIS — C9001 Multiple myeloma in remission: Secondary | ICD-10-CM | POA: Diagnosis not present

## 2017-01-15 DIAGNOSIS — Z7952 Long term (current) use of systemic steroids: Secondary | ICD-10-CM | POA: Diagnosis not present

## 2017-01-15 DIAGNOSIS — M5136 Other intervertebral disc degeneration, lumbar region: Secondary | ICD-10-CM | POA: Diagnosis not present

## 2017-01-15 DIAGNOSIS — I10 Essential (primary) hypertension: Secondary | ICD-10-CM | POA: Diagnosis not present

## 2017-01-15 DIAGNOSIS — C9001 Multiple myeloma in remission: Secondary | ICD-10-CM | POA: Diagnosis not present

## 2017-01-15 DIAGNOSIS — M353 Polymyalgia rheumatica: Secondary | ICD-10-CM | POA: Diagnosis not present

## 2017-01-19 ENCOUNTER — Other Ambulatory Visit: Payer: Self-pay | Admitting: *Deleted

## 2017-01-19 MED ORDER — LENALIDOMIDE 5 MG PO CAPS: 5.0000 mg | ORAL_CAPSULE | Freq: Every day | ORAL | 0 refills | Status: DC

## 2017-01-20 ENCOUNTER — Other Ambulatory Visit (HOSPITAL_BASED_OUTPATIENT_CLINIC_OR_DEPARTMENT_OTHER): Payer: Medicare HMO

## 2017-01-20 ENCOUNTER — Ambulatory Visit (HOSPITAL_BASED_OUTPATIENT_CLINIC_OR_DEPARTMENT_OTHER): Payer: Medicare HMO

## 2017-01-20 VITALS — BP 159/67 | HR 59 | Temp 97.7°F | Resp 18

## 2017-01-20 DIAGNOSIS — C9001 Multiple myeloma in remission: Secondary | ICD-10-CM

## 2017-01-20 DIAGNOSIS — Z5112 Encounter for antineoplastic immunotherapy: Secondary | ICD-10-CM | POA: Diagnosis not present

## 2017-01-20 LAB — CBC WITH DIFFERENTIAL/PLATELET
BASO%: 0.8 % (ref 0.0–2.0)
Basophils Absolute: 0 10*3/uL (ref 0.0–0.1)
EOS%: 2 % (ref 0.0–7.0)
Eosinophils Absolute: 0.1 10*3/uL (ref 0.0–0.5)
HCT: 38.7 % (ref 34.8–46.6)
HGB: 12.8 g/dL (ref 11.6–15.9)
LYMPH%: 24.1 % (ref 14.0–49.7)
MCH: 30.9 pg (ref 25.1–34.0)
MCHC: 33.1 g/dL (ref 31.5–36.0)
MCV: 93.6 fL (ref 79.5–101.0)
MONO#: 0.5 10*3/uL (ref 0.1–0.9)
MONO%: 8.1 % (ref 0.0–14.0)
NEUT%: 65 % (ref 38.4–76.8)
NEUTROS ABS: 3.6 10*3/uL (ref 1.5–6.5)
PLATELETS: 185 10*3/uL (ref 145–400)
RBC: 4.14 10*6/uL (ref 3.70–5.45)
RDW: 16.5 % — ABNORMAL HIGH (ref 11.2–14.5)
WBC: 5.6 10*3/uL (ref 3.9–10.3)
lymph#: 1.3 10*3/uL (ref 0.9–3.3)

## 2017-01-20 LAB — COMPREHENSIVE METABOLIC PANEL
ALT: 22 U/L (ref 0–55)
ANION GAP: 10 meq/L (ref 3–11)
AST: 18 U/L (ref 5–34)
Albumin: 3.8 g/dL (ref 3.5–5.0)
Alkaline Phosphatase: 46 U/L (ref 40–150)
BUN: 16.1 mg/dL (ref 7.0–26.0)
CHLORIDE: 106 meq/L (ref 98–109)
CO2: 25 mEq/L (ref 22–29)
CREATININE: 0.8 mg/dL (ref 0.6–1.1)
Calcium: 9.5 mg/dL (ref 8.4–10.4)
EGFR: 75 mL/min/{1.73_m2} — ABNORMAL LOW (ref >90)
Glucose: 117 mg/dl (ref 70–140)
Potassium: 4.4 mEq/L (ref 3.5–5.1)
SODIUM: 142 meq/L (ref 136–145)
TOTAL PROTEIN: 6.6 g/dL (ref 6.4–8.3)
Total Bilirubin: 0.39 mg/dL (ref 0.20–1.20)

## 2017-01-20 MED ORDER — PROCHLORPERAZINE MALEATE 10 MG PO TABS
10.0000 mg | ORAL_TABLET | Freq: Once | ORAL | Status: AC
  Administered 2017-01-20: 10 mg via ORAL

## 2017-01-20 MED ORDER — PROCHLORPERAZINE MALEATE 10 MG PO TABS
ORAL_TABLET | ORAL | Status: AC
  Filled 2017-01-20: qty 1

## 2017-01-20 MED ORDER — BORTEZOMIB CHEMO SQ INJECTION 3.5 MG (2.5MG/ML)
0.9750 mg/m2 | Freq: Once | INTRAMUSCULAR | Status: AC
  Administered 2017-01-20: 1.5 mg via SUBCUTANEOUS
  Filled 2017-01-20: qty 1.5

## 2017-01-20 NOTE — Patient Instructions (Signed)
[facility] Discharge Instructions for Patients Receiving Chemotherapy  Today you received the following chemotherapy agents Velcade. To help prevent nausea and vomiting after your treatment, we encourage you to take your nausea medication as directed.  If you develop nausea and vomiting that is not controlled by your nausea medication, call the clinic.   BELOW ARE SYMPTOMS THAT SHOULD BE REPORTED IMMEDIATELY:  *FEVER GREATER THAN 100.5 F  *CHILLS WITH OR WITHOUT FEVER  NAUSEA AND VOMITING THAT IS NOT CONTROLLED WITH YOUR NAUSEA MEDICATION  *UNUSUAL SHORTNESS OF BREATH  *UNUSUAL BRUISING OR BLEEDING  TENDERNESS IN MOUTH AND THROAT WITH OR WITHOUT PRESENCE OF ULCERS  *URINARY PROBLEMS  *BOWEL PROBLEMS  UNUSUAL RASH Items with * indicate a potential emergency and should be followed up as soon as possible.  Feel free to call the clinic you have any questions or concerns. The clinic phone number is [phone].  Please show the CHEMO ALERT CARD at check-in to the Emergency Department and triage nurse.    

## 2017-01-21 LAB — KAPPA/LAMBDA LIGHT CHAINS
Ig Kappa Free Light Chain: 104.4 mg/L — ABNORMAL HIGH (ref 3.3–19.4)
Ig Lambda Free Light Chain: 5.8 mg/L (ref 5.7–26.3)
KAPPA/LAMBDA FLC RATIO: 18 — AB (ref 0.26–1.65)

## 2017-01-23 DIAGNOSIS — C9001 Multiple myeloma in remission: Secondary | ICD-10-CM | POA: Diagnosis not present

## 2017-01-23 DIAGNOSIS — I10 Essential (primary) hypertension: Secondary | ICD-10-CM | POA: Diagnosis not present

## 2017-01-23 DIAGNOSIS — M353 Polymyalgia rheumatica: Secondary | ICD-10-CM | POA: Diagnosis not present

## 2017-01-23 DIAGNOSIS — Z7952 Long term (current) use of systemic steroids: Secondary | ICD-10-CM | POA: Diagnosis not present

## 2017-01-23 DIAGNOSIS — M5136 Other intervertebral disc degeneration, lumbar region: Secondary | ICD-10-CM | POA: Diagnosis not present

## 2017-01-23 LAB — MULTIPLE MYELOMA PANEL, SERUM
ALBUMIN SERPL ELPH-MCNC: 3.5 g/dL (ref 2.9–4.4)
Albumin/Glob SerPl: 1.3 (ref 0.7–1.7)
Alpha 1: 0.3 g/dL (ref 0.0–0.4)
Alpha2 Glob SerPl Elph-Mcnc: 0.8 g/dL (ref 0.4–1.0)
B-Globulin SerPl Elph-Mcnc: 1.2 g/dL (ref 0.7–1.3)
GAMMA GLOB SERPL ELPH-MCNC: 0.4 g/dL (ref 0.4–1.8)
Globulin, Total: 2.7 g/dL (ref 2.2–3.9)
IGA/IMMUNOGLOBULIN A, SERUM: 275 mg/dL (ref 64–422)
IgG, Qn, Serum: 421 mg/dL — ABNORMAL LOW (ref 700–1600)
IgM, Qn, Serum: 10 mg/dL — ABNORMAL LOW (ref 26–217)
M Protein SerPl Elph-Mcnc: 0.2 g/dL — ABNORMAL HIGH
TOTAL PROTEIN: 6.2 g/dL (ref 6.0–8.5)

## 2017-01-30 DIAGNOSIS — Z7952 Long term (current) use of systemic steroids: Secondary | ICD-10-CM | POA: Diagnosis not present

## 2017-01-30 DIAGNOSIS — C9001 Multiple myeloma in remission: Secondary | ICD-10-CM | POA: Diagnosis not present

## 2017-01-30 DIAGNOSIS — M353 Polymyalgia rheumatica: Secondary | ICD-10-CM | POA: Diagnosis not present

## 2017-01-30 DIAGNOSIS — M5136 Other intervertebral disc degeneration, lumbar region: Secondary | ICD-10-CM | POA: Diagnosis not present

## 2017-01-30 DIAGNOSIS — I10 Essential (primary) hypertension: Secondary | ICD-10-CM | POA: Diagnosis not present

## 2017-02-02 MED FILL — LISINOPRIL 40 MG TABLET: 40 | 30 days supply | Qty: 30 | Fill #4

## 2017-02-02 MED FILL — DEXAMETHASONE 4 MG TABLET: 4 | 60 days supply | Qty: 60 | Fill #1

## 2017-02-02 MED FILL — ACYCLOVIR 400 MG TABLET: 400 | 90 days supply | Qty: 90 | Fill #2

## 2017-02-02 MED FILL — TERBINAFINE HCL 250 MG TAB: 250 | 30 days supply | Qty: 30 | Fill #2

## 2017-02-03 ENCOUNTER — Ambulatory Visit (HOSPITAL_BASED_OUTPATIENT_CLINIC_OR_DEPARTMENT_OTHER): Payer: Medicare HMO

## 2017-02-03 ENCOUNTER — Other Ambulatory Visit (HOSPITAL_BASED_OUTPATIENT_CLINIC_OR_DEPARTMENT_OTHER): Payer: Medicare HMO

## 2017-02-03 ENCOUNTER — Encounter: Payer: Self-pay | Admitting: Hematology and Oncology

## 2017-02-03 ENCOUNTER — Ambulatory Visit (HOSPITAL_BASED_OUTPATIENT_CLINIC_OR_DEPARTMENT_OTHER): Payer: Medicare HMO | Admitting: Hematology and Oncology

## 2017-02-03 ENCOUNTER — Telehealth: Payer: Self-pay | Admitting: Hematology and Oncology

## 2017-02-03 VITALS — BP 146/79 | HR 99 | Temp 97.6°F | Resp 18 | Ht 63.0 in | Wt 146.0 lb

## 2017-02-03 DIAGNOSIS — R64 Cachexia: Secondary | ICD-10-CM

## 2017-02-03 DIAGNOSIS — C9001 Multiple myeloma in remission: Secondary | ICD-10-CM | POA: Diagnosis not present

## 2017-02-03 DIAGNOSIS — Z5112 Encounter for antineoplastic immunotherapy: Secondary | ICD-10-CM

## 2017-02-03 DIAGNOSIS — M48061 Spinal stenosis, lumbar region without neurogenic claudication: Secondary | ICD-10-CM

## 2017-02-03 DIAGNOSIS — G893 Neoplasm related pain (acute) (chronic): Secondary | ICD-10-CM

## 2017-02-03 LAB — COMPREHENSIVE METABOLIC PANEL
ALT: 22 U/L (ref 0–55)
ANION GAP: 10 meq/L (ref 3–11)
AST: 21 U/L (ref 5–34)
Albumin: 3.7 g/dL (ref 3.5–5.0)
Alkaline Phosphatase: 62 U/L (ref 40–150)
BUN: 13.9 mg/dL (ref 7.0–26.0)
CHLORIDE: 111 meq/L — AB (ref 98–109)
CO2: 21 meq/L — AB (ref 22–29)
CREATININE: 0.7 mg/dL (ref 0.6–1.1)
Calcium: 9.1 mg/dL (ref 8.4–10.4)
EGFR: 77 mL/min/{1.73_m2} — ABNORMAL LOW (ref >90)
Glucose: 126 mg/dl (ref 70–140)
Potassium: 4.5 mEq/L (ref 3.5–5.1)
SODIUM: 143 meq/L (ref 136–145)
Total Bilirubin: 0.57 mg/dL (ref 0.20–1.20)
Total Protein: 7.2 g/dL (ref 6.4–8.3)

## 2017-02-03 LAB — CBC WITH DIFFERENTIAL/PLATELET
BASO%: 1.1 % (ref 0.0–2.0)
Basophils Absolute: 0 10*3/uL (ref 0.0–0.1)
EOS%: 0.1 % (ref 0.0–7.0)
Eosinophils Absolute: 0 10*3/uL (ref 0.0–0.5)
HCT: 40.5 % (ref 34.8–46.6)
HGB: 13.6 g/dL (ref 11.6–15.9)
LYMPH%: 22.2 % (ref 14.0–49.7)
MCH: 31 pg (ref 25.1–34.0)
MCHC: 33.6 g/dL (ref 31.5–36.0)
MCV: 92.4 fL (ref 79.5–101.0)
MONO#: 0.3 10*3/uL (ref 0.1–0.9)
MONO%: 7.8 % (ref 0.0–14.0)
NEUT#: 3 10*3/uL (ref 1.5–6.5)
NEUT%: 68.8 % (ref 38.4–76.8)
PLATELETS: 148 10*3/uL (ref 145–400)
RBC: 4.39 10*6/uL (ref 3.70–5.45)
RDW: 16.2 % — ABNORMAL HIGH (ref 11.2–14.5)
WBC: 4.4 10*3/uL (ref 3.9–10.3)
lymph#: 1 10*3/uL (ref 0.9–3.3)

## 2017-02-03 MED ORDER — OXYCODONE-ACETAMINOPHEN 5-325 MG PO TABS: 1.0000 | ORAL_TABLET | Freq: Four times a day (QID) | ORAL | 0 refills | Status: DC | PRN

## 2017-02-03 MED ORDER — PROCHLORPERAZINE MALEATE 10 MG PO TABS
ORAL_TABLET | ORAL | Status: AC
  Filled 2017-02-03: qty 1

## 2017-02-03 MED ORDER — BORTEZOMIB CHEMO SQ INJECTION 3.5 MG (2.5MG/ML)
0.9750 mg/m2 | Freq: Once | INTRAMUSCULAR | Status: AC
  Administered 2017-02-03: 1.5 mg via SUBCUTANEOUS
  Filled 2017-02-03: qty 1.5

## 2017-02-03 MED ORDER — PROCHLORPERAZINE MALEATE 10 MG PO TABS
10.0000 mg | ORAL_TABLET | Freq: Once | ORAL | Status: AC
  Administered 2017-02-03: 10 mg via ORAL

## 2017-02-03 MED FILL — OXYCODONE/APAP 5/325 MG TAB: 5-325 | 23 days supply | Qty: 90 | Fill #0

## 2017-02-03 NOTE — Assessment & Plan Note (Signed)
She resumed Revlimid without complications Recent blood work showed improved disease controlled with VGPR We will continue on Velcade every other week injection and Revlimid, 5 mg, 21 days on, 7 days off along with quarterly Zometa. She will continue acyclovir, dexamethasone, calcium, vitamin D and aspirin I recommend slow taper of dexamethasone at 2 mg every other day until I see her back. I will see her back in 4 weeks for toxicity review We discussed extensively about goals of care and ultimately she agreed to continue treatment

## 2017-02-03 NOTE — Assessment & Plan Note (Signed)
Her cancer pain is improving with improved disease control. She is not taking much MS Contin. I refill her prescription oxycodone

## 2017-02-03 NOTE — Patient Instructions (Signed)
[facility] Discharge Instructions for Patients Receiving Chemotherapy  Today you received the following chemotherapy agents Velcade. To help prevent nausea and vomiting after your treatment, we encourage you to take your nausea medication as directed.  If you develop nausea and vomiting that is not controlled by your nausea medication, call the clinic.   BELOW ARE SYMPTOMS THAT SHOULD BE REPORTED IMMEDIATELY:  *FEVER GREATER THAN 100.5 F  *CHILLS WITH OR WITHOUT FEVER  NAUSEA AND VOMITING THAT IS NOT CONTROLLED WITH YOUR NAUSEA MEDICATION  *UNUSUAL SHORTNESS OF BREATH  *UNUSUAL BRUISING OR BLEEDING  TENDERNESS IN MOUTH AND THROAT WITH OR WITHOUT PRESENCE OF ULCERS  *URINARY PROBLEMS  *BOWEL PROBLEMS  UNUSUAL RASH Items with * indicate a potential emergency and should be followed up as soon as possible.  Feel free to call the clinic you have any questions or concerns. The clinic phone number is [phone].  Please show the CHEMO ALERT CARD at check-in to the Emergency Department and triage nurse.    

## 2017-02-03 NOTE — Assessment & Plan Note (Signed)
This has improved with good control of her disease. I will get her taper off dexamethasone slowly

## 2017-02-03 NOTE — Telephone Encounter (Signed)
Gave patient AVS and calender per 3/20 los.

## 2017-02-03 NOTE — Progress Notes (Signed)
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 Revlimid was stopped in July and resumed in October      04/01/2016 Adverse Reaction    Treatment was placed on hold temporarily due to neutropenia       INTERVAL HISTORY: Please see below for problem oriented charting. She is here today with HER-2 sisters. She is doing well. Bone pain is less. Blood pressure control is better. She had no side effects from treatment.  No skin rashes from injection. No nausea vomiting.  REVIEW OF SYSTEMS:   Constitutional: Denies fevers, chills or abnormal weight loss Eyes: Denies blurriness of vision Ears, nose, mouth, throat, and face: Denies mucositis or sore throat Respiratory: Denies cough, dyspnea or wheezes Cardiovascular: Denies palpitation, chest discomfort or lower extremity swelling Gastrointestinal:  Denies nausea, heartburn or change in bowel habits Skin: Denies abnormal skin rashes Lymphatics: Denies new lymphadenopathy or easy bruising Neurological:Denies numbness, tingling or new weaknesses Behavioral/Psych: Mood is stable, no new changes  All other systems were reviewed with the patient and are negative.  I have reviewed the past medical history, past surgical history, social history and family history with the patient and they are unchanged  from previous note.  ALLERGIES:  is allergic to aspirin; pravastatin; and zocor [simvastatin].  MEDICATIONS:  Current Outpatient Prescriptions  Medication Sig Dispense Refill  . acyclovir (ZOVIRAX) 400 MG tablet Take 1 tablet (400 mg total) by mouth daily. 90 tablet 3  . dexamethasone (DECADRON) 4 MG tablet Take 1 tablet (4 mg total) by mouth daily. 60 tablet 1  . lenalidomide (REVLIMID) 5 MG capsule Take 1 capsule (5 mg total) by mouth daily. 21 days on and 7 days off. 21 capsule 0  . lisinopril (PRINIVIL,ZESTRIL) 40 MG tablet Take 1 tablet (40 mg total) by mouth daily. 30 tablet 11  . oxyCODONE-acetaminophen (ROXICET) 5-325 MG tablet Take 1 tablet by mouth every 6 (six) hours as needed for severe pain. 90 tablet 0  . terbinafine (LAMISIL) 250 MG tablet Take 1 tablet (250 mg total) by mouth daily. 30 tablet 2  . morphine (MS CONTIN) 15 MG 12 hr tablet Take 1 tablet (15 mg total) by mouth every 12 (twelve) hours. (Patient not taking: Reported on 01/06/2017) 60 tablet 0   No current facility-administered medications for this visit.     PHYSICAL EXAMINATION: ECOG PERFORMANCE STATUS: 1 - Symptomatic but completely ambulatory  Vitals:   02/03/17 0930  BP: (!) 146/79  Pulse: 99  Resp: 18  Temp: 97.6 F (36.4 C)   Filed Weights   02/03/17 0930  Weight: 146 lb (66.2 kg)    GENERAL:alert, no distress and comfortable SKIN: skin color, texture, turgor are normal, no rashes or significant lesions EYES: normal, Conjunctiva are pink and non-injected, sclera clear  OROPHARYNX:no exudate, no erythema and lips, buccal mucosa, and tongue normal  NECK: supple, thyroid normal size, non-tender, without nodularity LYMPH:  no palpable lymphadenopathy in the cervical, axillary or inguinal LUNGS: clear to auscultation and percussion with normal breathing effort HEART: regular rate & rhythm and no murmurs and no lower extremity edema ABDOMEN:abdomen soft, non-tender and normal bowel  sounds Musculoskeletal:no cyanosis of digits and no clubbing  NEURO: alert & oriented x 3 with fluent speech, no focal motor/sensory deficits  LABORATORY DATA:  I have reviewed the data as listed    Component Value Date/Time   NA 143 02/03/2017 0902   K 4.5 02/03/2017 0902   CL 107 10/06/2016 0526   CO2 21 (L) 02/03/2017 0902   GLUCOSE 126 02/03/2017 0902   BUN 13.9 02/03/2017 0902   CREATININE 0.7 02/03/2017 0902   CALCIUM 9.1 02/03/2017 0902   PROT 7.2 02/03/2017 0902   ALBUMIN 3.7 02/03/2017 0902   AST 21 02/03/2017 0902   ALT 22 02/03/2017 0902   ALKPHOS 62 02/03/2017 0902   BILITOT 0.57 02/03/2017 0902   GFRNONAA >60 10/06/2016 0526   GFRNONAA 64 01/17/2016 0910   GFRAA >60 10/06/2016 0526   GFRAA 74 01/17/2016 0910    No results found for: SPEP, UPEP  Lab Results  Component Value Date   WBC 4.4 02/03/2017   NEUTROABS 3.0 02/03/2017   HGB 13.6 02/03/2017   HCT 40.5 02/03/2017   MCV 92.4 02/03/2017   PLT 148 02/03/2017      Chemistry      Component Value Date/Time   NA 143 02/03/2017 0902   K 4.5 02/03/2017 0902   CL 107 10/06/2016 0526   CO2 21 (L) 02/03/2017 0902   BUN 13.9 02/03/2017 0902   CREATININE 0.7 02/03/2017 0902      Component Value Date/Time   CALCIUM 9.1 02/03/2017 0902   ALKPHOS 62 02/03/2017 0902   AST 21 02/03/2017 0902   ALT 22 02/03/2017 0902   BILITOT 0.57 02/03/2017 0902      ASSESSMENT & PLAN:  Multiple myeloma in remission ([place]) She resumed Revlimid without complications Recent blood work showed improved disease controlled with VGPR We will continue on Velcade every other week injection and Revlimid, 5 mg, 21 days on, 7 days off along with quarterly Zometa. She will continue acyclovir, dexamethasone, calcium, vitamin D and aspirin I recommend slow taper of dexamethasone at 2 mg every other day until I see her back. I will see her back in 4 weeks for toxicity review We discussed extensively about goals of care and ultimately  she agreed to continue treatment  Cancer associated pain Her cancer pain is improving with improved disease control. She is not taking much MS Contin. I refill her prescription oxycodone  Malignant cachexia ([place]) This has improved with good control of her disease. I will get her taper off dexamethasone slowly   Orders Placed This Encounter  Procedures  . Kappa/lambda light chains    Standing Status:   Future    Standing Expiration Date:   03/10/2018  . Multiple Myeloma Panel (SPEP&IFE w/QIG)    Standing Status:   Future    Standing Expiration Date:   03/10/2018   All questions were answered. The patient knows to call the clinic with any problems, questions or concerns. No barriers to learning was detected. I spent 15 minutes counseling the patient face to face. The total time spent in the appointment was 20 minutes and more than 50% was on counseling  and review of test results     Heath Lark, MD 02/03/2017 9:45 AM

## 2017-02-04 DIAGNOSIS — C9001 Multiple myeloma in remission: Secondary | ICD-10-CM | POA: Diagnosis not present

## 2017-02-04 DIAGNOSIS — I10 Essential (primary) hypertension: Secondary | ICD-10-CM | POA: Diagnosis not present

## 2017-02-04 DIAGNOSIS — Z7952 Long term (current) use of systemic steroids: Secondary | ICD-10-CM | POA: Diagnosis not present

## 2017-02-04 DIAGNOSIS — M353 Polymyalgia rheumatica: Secondary | ICD-10-CM | POA: Diagnosis not present

## 2017-02-04 DIAGNOSIS — M5136 Other intervertebral disc degeneration, lumbar region: Secondary | ICD-10-CM | POA: Diagnosis not present

## 2017-02-13 ENCOUNTER — Other Ambulatory Visit: Payer: Self-pay | Admitting: *Deleted

## 2017-02-13 MED ORDER — LENALIDOMIDE 5 MG PO CAPS: 5.0000 mg | ORAL_CAPSULE | Freq: Every day | ORAL | 0 refills | Status: DC

## 2017-02-17 ENCOUNTER — Other Ambulatory Visit (HOSPITAL_BASED_OUTPATIENT_CLINIC_OR_DEPARTMENT_OTHER): Payer: Medicare HMO

## 2017-02-17 ENCOUNTER — Ambulatory Visit (HOSPITAL_BASED_OUTPATIENT_CLINIC_OR_DEPARTMENT_OTHER): Payer: Medicare HMO

## 2017-02-17 VITALS — BP 166/81 | HR 62 | Temp 98.0°F | Resp 19

## 2017-02-17 DIAGNOSIS — C9001 Multiple myeloma in remission: Secondary | ICD-10-CM | POA: Diagnosis not present

## 2017-02-17 DIAGNOSIS — Z5112 Encounter for antineoplastic immunotherapy: Secondary | ICD-10-CM | POA: Diagnosis not present

## 2017-02-17 LAB — COMPREHENSIVE METABOLIC PANEL
ALT: 21 U/L (ref 0–55)
AST: 19 U/L (ref 5–34)
Albumin: 3.6 g/dL (ref 3.5–5.0)
Alkaline Phosphatase: 52 U/L (ref 40–150)
Anion Gap: 11 mEq/L (ref 3–11)
BILIRUBIN TOTAL: 0.48 mg/dL (ref 0.20–1.20)
BUN: 11.1 mg/dL (ref 7.0–26.0)
CHLORIDE: 108 meq/L (ref 98–109)
CO2: 24 meq/L (ref 22–29)
CREATININE: 0.8 mg/dL (ref 0.6–1.1)
Calcium: 9.1 mg/dL (ref 8.4–10.4)
EGFR: 68 mL/min/{1.73_m2} — ABNORMAL LOW (ref >90)
GLUCOSE: 100 mg/dL (ref 70–140)
Potassium: 3.9 mEq/L (ref 3.5–5.1)
SODIUM: 143 meq/L (ref 136–145)
TOTAL PROTEIN: 6.4 g/dL (ref 6.4–8.3)

## 2017-02-17 LAB — CBC WITH DIFFERENTIAL/PLATELET
BASO%: 1.4 % (ref 0.0–2.0)
Basophils Absolute: 0.1 10*3/uL (ref 0.0–0.1)
EOS%: 2.4 % (ref 0.0–7.0)
Eosinophils Absolute: 0.1 10*3/uL (ref 0.0–0.5)
HCT: 40.1 % (ref 34.8–46.6)
HGB: 13.3 g/dL (ref 11.6–15.9)
LYMPH%: 17.6 % (ref 14.0–49.7)
MCH: 31 pg (ref 25.1–34.0)
MCHC: 33.3 g/dL (ref 31.5–36.0)
MCV: 93.1 fL (ref 79.5–101.0)
MONO#: 0.3 10*3/uL (ref 0.1–0.9)
MONO%: 5.6 % (ref 0.0–14.0)
NEUT%: 73 % (ref 38.4–76.8)
NEUTROS ABS: 4.3 10*3/uL (ref 1.5–6.5)
Platelets: 202 10*3/uL (ref 145–400)
RBC: 4.31 10*6/uL (ref 3.70–5.45)
RDW: 16.7 % — ABNORMAL HIGH (ref 11.2–14.5)
WBC: 5.9 10*3/uL (ref 3.9–10.3)
lymph#: 1 10*3/uL (ref 0.9–3.3)

## 2017-02-17 MED ORDER — BORTEZOMIB CHEMO SQ INJECTION 3.5 MG (2.5MG/ML)
0.9750 mg/m2 | Freq: Once | INTRAMUSCULAR | Status: AC
  Administered 2017-02-17: 1.5 mg via SUBCUTANEOUS
  Filled 2017-02-17: qty 1.5

## 2017-02-17 MED ORDER — PROCHLORPERAZINE MALEATE 10 MG PO TABS
ORAL_TABLET | ORAL | Status: AC
  Filled 2017-02-17: qty 1

## 2017-02-17 MED ORDER — PROCHLORPERAZINE MALEATE 10 MG PO TABS
10.0000 mg | ORAL_TABLET | Freq: Once | ORAL | Status: AC
  Administered 2017-02-17: 10 mg via ORAL

## 2017-02-17 NOTE — Patient Instructions (Signed)
[facility] Discharge Instructions for Patients Receiving Chemotherapy  Today you received the following chemotherapy agents Velcade. To help prevent nausea and vomiting after your treatment, we encourage you to take your nausea medication as directed.  If you develop nausea and vomiting that is not controlled by your nausea medication, call the clinic.   BELOW ARE SYMPTOMS THAT SHOULD BE REPORTED IMMEDIATELY:  *FEVER GREATER THAN 100.5 F  *CHILLS WITH OR WITHOUT FEVER  NAUSEA AND VOMITING THAT IS NOT CONTROLLED WITH YOUR NAUSEA MEDICATION  *UNUSUAL SHORTNESS OF BREATH  *UNUSUAL BRUISING OR BLEEDING  TENDERNESS IN MOUTH AND THROAT WITH OR WITHOUT PRESENCE OF ULCERS  *URINARY PROBLEMS  *BOWEL PROBLEMS  UNUSUAL RASH Items with * indicate a potential emergency and should be followed up as soon as possible.  Feel free to call the clinic you have any questions or concerns. The clinic phone number is [phone].  Please show the CHEMO ALERT CARD at check-in to the Emergency Department and triage nurse.    

## 2017-02-18 LAB — KAPPA/LAMBDA LIGHT CHAINS
IG LAMBDA FREE LIGHT CHAIN: 6 mg/L (ref 5.7–26.3)
Ig Kappa Free Light Chain: 146.5 mg/L — ABNORMAL HIGH (ref 3.3–19.4)
KAPPA/LAMBDA FLC RATIO: 24.42 — AB (ref 0.26–1.65)

## 2017-02-19 LAB — MULTIPLE MYELOMA PANEL, SERUM
ALBUMIN SERPL ELPH-MCNC: 3.4 g/dL (ref 2.9–4.4)
ALBUMIN/GLOB SERPL: 1.5 (ref 0.7–1.7)
Alpha 1: 0.2 g/dL (ref 0.0–0.4)
Alpha2 Glob SerPl Elph-Mcnc: 0.7 g/dL (ref 0.4–1.0)
B-GLOBULIN SERPL ELPH-MCNC: 1.1 g/dL (ref 0.7–1.3)
GAMMA GLOB SERPL ELPH-MCNC: 0.4 g/dL (ref 0.4–1.8)
GLOBULIN, TOTAL: 2.4 g/dL (ref 2.2–3.9)
IGA/IMMUNOGLOBULIN A, SERUM: 266 mg/dL (ref 64–422)
IgG, Qn, Serum: 412 mg/dL — ABNORMAL LOW (ref 700–1600)
IgM, Qn, Serum: 10 mg/dL — ABNORMAL LOW (ref 26–217)
M PROTEIN SERPL ELPH-MCNC: 0.3 g/dL — AB
Total Protein: 5.8 g/dL — ABNORMAL LOW (ref 6.0–8.5)

## 2017-03-02 ENCOUNTER — Other Ambulatory Visit: Payer: Self-pay | Admitting: Hematology and Oncology

## 2017-03-02 DIAGNOSIS — C9001 Multiple myeloma in remission: Secondary | ICD-10-CM

## 2017-03-03 ENCOUNTER — Other Ambulatory Visit: Payer: Self-pay | Admitting: Family Medicine

## 2017-03-03 ENCOUNTER — Telehealth: Payer: Self-pay | Admitting: Hematology and Oncology

## 2017-03-03 ENCOUNTER — Ambulatory Visit (HOSPITAL_BASED_OUTPATIENT_CLINIC_OR_DEPARTMENT_OTHER): Payer: Medicare HMO

## 2017-03-03 ENCOUNTER — Encounter (INDEPENDENT_AMBULATORY_CARE_PROVIDER_SITE_OTHER): Payer: Self-pay

## 2017-03-03 ENCOUNTER — Encounter: Payer: Self-pay | Admitting: Hematology and Oncology

## 2017-03-03 ENCOUNTER — Other Ambulatory Visit (HOSPITAL_BASED_OUTPATIENT_CLINIC_OR_DEPARTMENT_OTHER): Payer: Medicare HMO

## 2017-03-03 ENCOUNTER — Ambulatory Visit (HOSPITAL_BASED_OUTPATIENT_CLINIC_OR_DEPARTMENT_OTHER): Payer: Medicare HMO | Admitting: Hematology and Oncology

## 2017-03-03 VITALS — BP 142/70 | HR 85 | Temp 97.6°F | Resp 18 | Ht 63.0 in | Wt 149.9 lb

## 2017-03-03 DIAGNOSIS — C9001 Multiple myeloma in remission: Secondary | ICD-10-CM

## 2017-03-03 DIAGNOSIS — G893 Neoplasm related pain (acute) (chronic): Secondary | ICD-10-CM | POA: Diagnosis not present

## 2017-03-03 DIAGNOSIS — Z5112 Encounter for antineoplastic immunotherapy: Secondary | ICD-10-CM

## 2017-03-03 DIAGNOSIS — B351 Tinea unguium: Secondary | ICD-10-CM

## 2017-03-03 DIAGNOSIS — R413 Other amnesia: Secondary | ICD-10-CM

## 2017-03-03 DIAGNOSIS — C9000 Multiple myeloma not having achieved remission: Secondary | ICD-10-CM

## 2017-03-03 LAB — CBC WITH DIFFERENTIAL/PLATELET
BASO%: 2.1 % — AB (ref 0.0–2.0)
Basophils Absolute: 0.1 10*3/uL (ref 0.0–0.1)
EOS%: 2.3 % (ref 0.0–7.0)
Eosinophils Absolute: 0.1 10*3/uL (ref 0.0–0.5)
HEMATOCRIT: 38.6 % (ref 34.8–46.6)
HGB: 12.8 g/dL (ref 11.6–15.9)
LYMPH#: 1.8 10*3/uL (ref 0.9–3.3)
LYMPH%: 40.8 % (ref 14.0–49.7)
MCH: 30.6 pg (ref 25.1–34.0)
MCHC: 33.1 g/dL (ref 31.5–36.0)
MCV: 92.4 fL (ref 79.5–101.0)
MONO#: 0.6 10*3/uL (ref 0.1–0.9)
MONO%: 13.9 % (ref 0.0–14.0)
NEUT#: 1.8 10*3/uL (ref 1.5–6.5)
NEUT%: 40.9 % (ref 38.4–76.8)
PLATELETS: 146 10*3/uL (ref 145–400)
RBC: 4.18 10*6/uL (ref 3.70–5.45)
RDW: 16.2 % — ABNORMAL HIGH (ref 11.2–14.5)
WBC: 4.5 10*3/uL (ref 3.9–10.3)

## 2017-03-03 LAB — COMPREHENSIVE METABOLIC PANEL
ALBUMIN: 3.6 g/dL (ref 3.5–5.0)
ALK PHOS: 50 U/L (ref 40–150)
ALT: 17 U/L (ref 0–55)
AST: 17 U/L (ref 5–34)
Anion Gap: 13 mEq/L — ABNORMAL HIGH (ref 3–11)
BUN: 14.2 mg/dL (ref 7.0–26.0)
CO2: 21 meq/L — AB (ref 22–29)
Calcium: 9.5 mg/dL (ref 8.4–10.4)
Chloride: 112 mEq/L — ABNORMAL HIGH (ref 98–109)
Creatinine: 0.7 mg/dL (ref 0.6–1.1)
EGFR: 79 mL/min/{1.73_m2} — AB (ref >90)
GLUCOSE: 113 mg/dL (ref 70–140)
POTASSIUM: 3.7 meq/L (ref 3.5–5.1)
SODIUM: 146 meq/L — AB (ref 136–145)
Total Bilirubin: 0.5 mg/dL (ref 0.20–1.20)
Total Protein: 6.4 g/dL (ref 6.4–8.3)

## 2017-03-03 MED ORDER — BORTEZOMIB CHEMO SQ INJECTION 3.5 MG (2.5MG/ML)
0.9750 mg/m2 | Freq: Once | INTRAMUSCULAR | Status: AC
  Administered 2017-03-03: 1.5 mg via SUBCUTANEOUS
  Filled 2017-03-03: qty 1.5

## 2017-03-03 MED ORDER — PROCHLORPERAZINE MALEATE 10 MG PO TABS
10.0000 mg | ORAL_TABLET | Freq: Once | ORAL | Status: AC
  Administered 2017-03-03: 10 mg via ORAL

## 2017-03-03 MED ORDER — SODIUM CHLORIDE 0.9 % IV SOLN
Freq: Once | INTRAVENOUS | Status: AC
  Administered 2017-03-03: 11:00:00 via INTRAVENOUS

## 2017-03-03 MED ORDER — PROCHLORPERAZINE MALEATE 10 MG PO TABS
ORAL_TABLET | ORAL | Status: AC
  Filled 2017-03-03: qty 1

## 2017-03-03 MED ORDER — ZOLEDRONIC ACID 4 MG/5ML IV CONC
3.5000 mg | Freq: Once | INTRAVENOUS | Status: AC
  Administered 2017-03-03: 3.5 mg via INTRAVENOUS
  Filled 2017-03-03: qty 4.38

## 2017-03-03 MED FILL — LISINOPRIL 40 MG TABLET: 40 | 30 days supply | Qty: 30 | Fill #5

## 2017-03-03 MED FILL — TERBINAFINE HCL 250 MG TAB: 250 | 30 days supply | Qty: 30 | Fill #0

## 2017-03-03 NOTE — Patient Instructions (Addendum)
[facility] Discharge Instructions for Patients Receiving Chemotherapy  Today you received the following chemotherapy agents: Velcade   To help prevent nausea and vomiting after your treatment, we encourage you to take your nausea medication as directed.    If you develop nausea and vomiting that is not controlled by your nausea medication, call the clinic.   BELOW ARE SYMPTOMS THAT SHOULD BE REPORTED IMMEDIATELY:  *FEVER GREATER THAN 100.5 F  *CHILLS WITH OR WITHOUT FEVER  NAUSEA AND VOMITING THAT IS NOT CONTROLLED WITH YOUR NAUSEA MEDICATION  *UNUSUAL SHORTNESS OF BREATH  *UNUSUAL BRUISING OR BLEEDING  TENDERNESS IN MOUTH AND THROAT WITH OR WITHOUT PRESENCE OF ULCERS  *URINARY PROBLEMS  *BOWEL PROBLEMS  UNUSUAL RASH Items with * indicate a potential emergency and should be followed up as soon as possible.  Feel free to call the clinic you have any questions or concerns. The clinic phone number is [phone].  Please show the CHEMO ALERT CARD at check-in to the Emergency Department and triage nurse.    Zoledronic Acid injection (Hypercalcemia, Oncology) What is this medicine? ZOLEDRONIC ACID (ZOE le dron ik AS id) lowers the amount of calcium loss from bone. It is used to treat too much calcium in your blood from cancer. It is also used to prevent complications of cancer that has spread to the bone. This medicine may be used for other purposes; ask your health care provider or pharmacist if you have questions. COMMON BRAND NAME(S): Zometa What should I tell my health care provider before I take this medicine? They need to know if you have any of these conditions: -aspirin-sensitive asthma -cancer, especially if you are receiving medicines used to treat cancer -dental disease or wear dentures -infection -kidney disease -receiving corticosteroids like dexamethasone or prednisone -an unusual or allergic reaction to zoledronic acid, other medicines,  foods, dyes, or preservatives -pregnant or trying to get pregnant -breast-feeding How should I use this medicine? This medicine is for infusion into a vein. It is given by a health care professional in a hospital or clinic setting. Talk to your pediatrician regarding the use of this medicine in children. Special care may be needed. Overdosage: If you think you have taken too much of this medicine contact a poison control center or emergency room at once. NOTE: This medicine is only for you. Do not share this medicine with others. What if I miss a dose? It is important not to miss your dose. Call your doctor or health care professional if you are unable to keep an appointment. What may interact with this medicine? -certain antibiotics given by injection -NSAIDs, medicines for pain and inflammation, like ibuprofen or naproxen -some diuretics like bumetanide, furosemide -teriparatide -thalidomide This list may not describe all possible interactions. Give your health care provider a list of all the medicines, herbs, non-prescription drugs, or dietary supplements you use. Also tell them if you smoke, drink alcohol, or use illegal drugs. Some items may interact with your medicine. What should I watch for while using this medicine? Visit your doctor or health care professional for regular checkups. It may be some time before you see the benefit from this medicine. Do not stop taking your medicine unless your doctor tells you to. Your doctor may order blood tests or other tests to see how you are doing. Women should inform their doctor if they wish to become pregnant or think they might be pregnant. There is a potential for serious side effects to an unborn   child. Talk to your health care professional or pharmacist for more information. You should make sure that you get enough calcium and vitamin D while you are taking this medicine. Discuss the foods you eat and the vitamins you take with your health  care professional. Some people who take this medicine have severe bone, joint, and/or muscle pain. This medicine may also increase your risk for jaw problems or a broken thigh bone. Tell your doctor right away if you have severe pain in your jaw, bones, joints, or muscles. Tell your doctor if you have any pain that does not go away or that gets worse. Tell your dentist and dental surgeon that you are taking this medicine. You should not have major dental surgery while on this medicine. See your dentist to have a dental exam and fix any dental problems before starting this medicine. Take good care of your teeth while on this medicine. Make sure you see your dentist for regular follow-up appointments. What side effects may I notice from receiving this medicine? Side effects that you should report to your doctor or health care professional as soon as possible: -allergic reactions like skin rash, itching or hives, swelling of the face, lips, or tongue -anxiety, confusion, or depression -breathing problems -changes in vision -eye pain -feeling faint or lightheaded, falls -jaw pain, especially after dental work -mouth sores -muscle cramps, stiffness, or weakness -redness, blistering, peeling or loosening of the skin, including inside the mouth -trouble passing urine or change in the amount of urine Side effects that usually do not require medical attention (report to your doctor or health care professional if they continue or are bothersome): -bone, joint, or muscle pain -constipation -diarrhea -fever -hair loss -irritation at site where injected -loss of appetite -nausea, vomiting -stomach upset -trouble sleeping -trouble swallowing -weak or tired This list may not describe all possible side effects. Call your doctor for medical advice about side effects. You may report side effects to FDA at 1-800-FDA-1088. Where should I keep my medicine? This drug is given in a hospital or clinic and  will not be stored at home. NOTE: This sheet is a summary. It may not cover all possible information. If you have questions about this medicine, talk to your doctor, pharmacist, or health care provider.  2018 Elsevier/Gold Standard (2014-04-01 14:19:39)  

## 2017-03-03 NOTE — Assessment & Plan Note (Signed)
Her cancer pain is improving with improved disease control. She is not taking much MS Contin.

## 2017-03-03 NOTE — Assessment & Plan Note (Signed)
She has poor memory and signs of dementia. I do not believe she is capable of making her own medical decision and I defer to her sister to make decision for her. According to his sister, she has good compliance taking all the medications as instructed

## 2017-03-03 NOTE — Assessment & Plan Note (Signed)
She is able to tolerated reduced dose Revlimid without complications Recent blood work showed improved disease controlled with VGPR We will continue on Velcade every other week injection and Revlimid, 5 mg, 21 days on, 7 days off along with quarterly Zometa. She will continue acyclovir, dexamethasone, calcium, vitamin D and aspirin She has completed recent dexamethasone taper I will see her back in 4 weeks for toxicity review We discussed extensively about goals of care and ultimately she agreed to continue treatment

## 2017-03-03 NOTE — Progress Notes (Signed)
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 Revlimid was stopped in July and resumed in October      04/01/2016 Adverse Reaction    Treatment was placed on hold temporarily due to neutropenia       INTERVAL HISTORY: Please see below for problem oriented charting. She returns with her 2 sisters for further follow-up She is doing very well She denies pain She is gaining weight Denies recent infection No recent peripheral neuropathy According to her sister, she is taking all the medications as prescribed  REVIEW OF SYSTEMS:   Constitutional: Denies fevers, chills or abnormal weight loss Eyes: Denies blurriness of vision Ears, nose, mouth, throat, and face: Denies mucositis or sore throat Respiratory: Denies cough, dyspnea or wheezes Cardiovascular: Denies palpitation, chest discomfort or lower extremity swelling Gastrointestinal:  Denies nausea, heartburn or change in bowel habits Skin: Denies abnormal skin rashes Lymphatics: Denies new lymphadenopathy or easy bruising Neurological:Denies numbness, tingling or new weaknesses Behavioral/Psych: Mood is stable, no new changes  All other systems were reviewed with the patient and are negative.  I have reviewed the past medical history, past surgical history, social history and family  history with the patient and they are unchanged from previous note.  ALLERGIES:  is allergic to aspirin; pravastatin; and zocor [simvastatin].  MEDICATIONS:  Current Outpatient Prescriptions  Medication Sig Dispense Refill  . acyclovir (ZOVIRAX) 400 MG tablet Take 1 tablet (400 mg total) by mouth daily. 90 tablet 3  . dexamethasone (DECADRON) 4 MG tablet Take 1 tablet (4 mg total) by mouth daily. 60 tablet 1  . lenalidomide (REVLIMID) 5 MG capsule Take 1 capsule (5 mg total) by mouth daily. 21 days on and 7 days off. 21 capsule 0  . lisinopril (PRINIVIL,ZESTRIL) 40 MG tablet Take 1 tablet (40 mg total) by mouth daily. 30 tablet 11  . morphine (MS CONTIN) 15 MG 12 hr tablet Take 1 tablet (15 mg total) by mouth every 12 (twelve) hours. (Patient not taking: Reported on 01/06/2017) 60 tablet 0  . oxyCODONE-acetaminophen (ROXICET) 5-325 MG tablet Take 1 tablet by mouth every 6 (six) hours as needed for severe pain. 90 tablet 0  . terbinafine (LAMISIL) 250 MG tablet Take 1 tablet (250 mg total) by mouth daily. 30 tablet 2   No current facility-administered medications for this visit.    Facility-Administered Medications Ordered in Other Visits  Medication Dose Route Frequency Provider Last Rate Last Dose  . 0.9 %  sodium chloride infusion   Intravenous Once Heath Lark, MD      . bortezomib SQ (VELCADE) chemo injection 1.5 mg  0.975 mg/m2 (Treatment Plan Recorded) Subcutaneous Once Heath Lark, MD      . zolendronic acid (ZOMETA) 3.5 mg in sodium chloride 0.9 % 100 mL  IVPB  3.5 mg Intravenous Once Heath Lark, MD        PHYSICAL EXAMINATION: ECOG PERFORMANCE STATUS: 1 - Symptomatic but completely ambulatory  Vitals:   03/03/17 0938  BP: (!) 142/70  Pulse: 85  Resp: 18  Temp: 97.6 F (36.4 C)   Filed Weights   03/03/17 0938  Weight: 149 lb 14.4 oz (68 kg)    GENERAL:alert, no distress and comfortable SKIN: skin color, texture, turgor are normal, no rashes or significant lesions EYES:  normal, Conjunctiva are pink and non-injected, sclera clear OROPHARYNX:no exudate, no erythema and lips, buccal mucosa, and tongue normal  NECK: supple, thyroid normal size, non-tender, without nodularity LYMPH:  no palpable lymphadenopathy in the cervical, axillary or inguinal LUNGS: clear to auscultation and percussion with normal breathing effort HEART: regular rate & rhythm and no murmurs and no lower extremity edema ABDOMEN:abdomen soft, non-tender and normal bowel sounds Musculoskeletal:no cyanosis of digits and no clubbing  NEURO: alert & oriented x 3 with fluent speech, no focal motor/sensory deficits  LABORATORY DATA:  I have reviewed the data as listed    Component Value Date/Time   NA 146 (H) 03/03/2017 0921   K 3.7 03/03/2017 0921   CL 107 10/06/2016 0526   CO2 21 (L) 03/03/2017 0921   GLUCOSE 113 03/03/2017 0921   BUN 14.2 03/03/2017 0921   CREATININE 0.7 03/03/2017 0921   CALCIUM 9.5 03/03/2017 0921   PROT 6.4 03/03/2017 0921   ALBUMIN 3.6 03/03/2017 0921   AST 17 03/03/2017 0921   ALT 17 03/03/2017 0921   ALKPHOS 50 03/03/2017 0921   BILITOT 0.50 03/03/2017 0921   GFRNONAA >60 10/06/2016 0526   GFRNONAA 64 01/17/2016 0910   GFRAA >60 10/06/2016 0526   GFRAA 74 01/17/2016 0910    No results found for: SPEP, UPEP  Lab Results  Component Value Date   WBC 4.5 03/03/2017   NEUTROABS 1.8 03/03/2017   HGB 12.8 03/03/2017   HCT 38.6 03/03/2017   MCV 92.4 03/03/2017   PLT 146 03/03/2017      Chemistry      Component Value Date/Time   NA 146 (H) 03/03/2017 0921   K 3.7 03/03/2017 0921   CL 107 10/06/2016 0526   CO2 21 (L) 03/03/2017 0921   BUN 14.2 03/03/2017 0921   CREATININE 0.7 03/03/2017 0921      Component Value Date/Time   CALCIUM 9.5 03/03/2017 0921   ALKPHOS 50 03/03/2017 0921   AST 17 03/03/2017 0921   ALT 17 03/03/2017 0921   BILITOT 0.50 03/03/2017 0921      ASSESSMENT & PLAN:  Multiple myeloma in remission ([place]) She is able to  tolerated reduced dose Revlimid without complications Recent blood work showed improved disease controlled with VGPR We will continue on Velcade every other week injection and Revlimid, 5 mg, 21 days on, 7 days off along with quarterly Zometa. She will continue acyclovir, dexamethasone, calcium, vitamin D and aspirin She has completed recent dexamethasone taper I will see her back in 4 weeks for toxicity review We discussed extensively about goals of care and ultimately she agreed to continue treatment  Cancer associated pain Her cancer pain is improving with improved disease control. She is not taking much MS Contin.  Memory deficits She has poor memory and signs of dementia. I do not believe she is capable of making her own medical decision and I defer to her sister to make decision for her. According to his sister, she has good  compliance taking all the medications as instructed   Orders Placed This Encounter  Procedures  . Kappa/lambda light chains    Standing Status:   Standing    Number of Occurrences:   11    Standing Expiration Date:   03/03/2018  . Multiple Myeloma Panel (SPEP&IFE w/QIG)    Standing Status:   Standing    Number of Occurrences:   11    Standing Expiration Date:   03/03/2018   All questions were answered. The patient knows to call the clinic with any problems, questions or concerns. No barriers to learning was detected. I spent 15 minutes counseling the patient face to face. The total time spent in the appointment was 20 minutes and more than 50% was on counseling and review of test results     Heath Lark, MD 03/03/2017 10:30 AM

## 2017-03-03 NOTE — Telephone Encounter (Signed)
Appointments scheduled per 4.17.18 LOS. Patient given AVS report and calendars with future scheduled appointments. °

## 2017-03-13 ENCOUNTER — Other Ambulatory Visit: Payer: Self-pay | Admitting: Medical Oncology

## 2017-03-13 DIAGNOSIS — C9001 Multiple myeloma in remission: Secondary | ICD-10-CM

## 2017-03-13 MED ORDER — LENALIDOMIDE 5 MG PO CAPS: 5.0000 mg | ORAL_CAPSULE | Freq: Every day | ORAL | 0 refills | Status: DC

## 2017-03-16 ENCOUNTER — Other Ambulatory Visit: Payer: Self-pay | Admitting: *Deleted

## 2017-03-16 DIAGNOSIS — C9001 Multiple myeloma in remission: Secondary | ICD-10-CM

## 2017-03-16 MED ORDER — LENALIDOMIDE 5 MG PO CAPS: 5.0000 mg | ORAL_CAPSULE | Freq: Every day | ORAL | 0 refills | Status: DC

## 2017-03-17 ENCOUNTER — Ambulatory Visit (HOSPITAL_BASED_OUTPATIENT_CLINIC_OR_DEPARTMENT_OTHER): Payer: Medicare HMO

## 2017-03-17 ENCOUNTER — Other Ambulatory Visit (HOSPITAL_BASED_OUTPATIENT_CLINIC_OR_DEPARTMENT_OTHER): Payer: Medicare HMO

## 2017-03-17 VITALS — BP 125/84 | HR 66 | Temp 97.9°F | Resp 17

## 2017-03-17 DIAGNOSIS — C9001 Multiple myeloma in remission: Secondary | ICD-10-CM

## 2017-03-17 DIAGNOSIS — Z5112 Encounter for antineoplastic immunotherapy: Secondary | ICD-10-CM | POA: Diagnosis not present

## 2017-03-17 LAB — CBC WITH DIFFERENTIAL/PLATELET
BASO%: 0.7 % (ref 0.0–2.0)
BASOS ABS: 0 10*3/uL (ref 0.0–0.1)
EOS%: 3.7 % (ref 0.0–7.0)
Eosinophils Absolute: 0.2 10*3/uL (ref 0.0–0.5)
HCT: 38.3 % (ref 34.8–46.6)
HEMOGLOBIN: 12.4 g/dL (ref 11.6–15.9)
LYMPH%: 31.1 % (ref 14.0–49.7)
MCH: 30.2 pg (ref 25.1–34.0)
MCHC: 32.4 g/dL (ref 31.5–36.0)
MCV: 93.4 fL (ref 79.5–101.0)
MONO#: 0.8 10*3/uL (ref 0.1–0.9)
MONO%: 15.4 % — AB (ref 0.0–14.0)
NEUT#: 2.7 10*3/uL (ref 1.5–6.5)
NEUT%: 49.1 % (ref 38.4–76.8)
Platelets: 209 10*3/uL (ref 145–400)
RBC: 4.1 10*6/uL (ref 3.70–5.45)
RDW: 15.4 % — AB (ref 11.2–14.5)
WBC: 5.4 10*3/uL (ref 3.9–10.3)
lymph#: 1.7 10*3/uL (ref 0.9–3.3)

## 2017-03-17 LAB — COMPREHENSIVE METABOLIC PANEL
ALT: 19 U/L (ref 0–55)
ANION GAP: 11 meq/L (ref 3–11)
AST: 19 U/L (ref 5–34)
Albumin: 3.6 g/dL (ref 3.5–5.0)
Alkaline Phosphatase: 61 U/L (ref 40–150)
BILIRUBIN TOTAL: 0.4 mg/dL (ref 0.20–1.20)
BUN: 10.9 mg/dL (ref 7.0–26.0)
CALCIUM: 9.1 mg/dL (ref 8.4–10.4)
CHLORIDE: 105 meq/L (ref 98–109)
CO2: 26 meq/L (ref 22–29)
CREATININE: 0.7 mg/dL (ref 0.6–1.1)
EGFR: 79 mL/min/{1.73_m2} — ABNORMAL LOW (ref >90)
Glucose: 96 mg/dl (ref 70–140)
POTASSIUM: 3.9 meq/L (ref 3.5–5.1)
Sodium: 142 mEq/L (ref 136–145)
Total Protein: 6.6 g/dL (ref 6.4–8.3)

## 2017-03-17 MED ORDER — PROCHLORPERAZINE MALEATE 10 MG PO TABS
10.0000 mg | ORAL_TABLET | Freq: Once | ORAL | Status: AC
  Administered 2017-03-17: 10 mg via ORAL

## 2017-03-17 MED ORDER — PROCHLORPERAZINE MALEATE 10 MG PO TABS
ORAL_TABLET | ORAL | Status: AC
  Filled 2017-03-17: qty 1

## 2017-03-17 MED ORDER — BORTEZOMIB CHEMO SQ INJECTION 3.5 MG (2.5MG/ML)
0.9750 mg/m2 | Freq: Once | INTRAMUSCULAR | Status: AC
  Administered 2017-03-17: 1.5 mg via SUBCUTANEOUS
  Filled 2017-03-17: qty 1.5

## 2017-03-17 NOTE — Patient Instructions (Signed)

## 2017-03-18 LAB — KAPPA/LAMBDA LIGHT CHAINS
IG KAPPA FREE LIGHT CHAIN: 205.9 mg/L — AB (ref 3.3–19.4)
IG LAMBDA FREE LIGHT CHAIN: 7.4 mg/L (ref 5.7–26.3)
KAPPA/LAMBDA FLC RATIO: 27.82 — AB (ref 0.26–1.65)

## 2017-03-23 LAB — MULTIPLE MYELOMA PANEL, SERUM
Albumin SerPl Elph-Mcnc: 3.3 g/dL (ref 2.9–4.4)
Albumin/Glob SerPl: 1.3 (ref 0.7–1.7)
Alpha 1: 0.3 g/dL (ref 0.0–0.4)
Alpha2 Glob SerPl Elph-Mcnc: 0.9 g/dL (ref 0.4–1.0)
B-GLOBULIN SERPL ELPH-MCNC: 1.2 g/dL (ref 0.7–1.3)
GLOBULIN, TOTAL: 2.7 g/dL (ref 2.2–3.9)
Gamma Glob SerPl Elph-Mcnc: 0.4 g/dL (ref 0.4–1.8)
IGG (IMMUNOGLOBIN G), SERUM: 408 mg/dL — AB (ref 700–1600)
IgA, Qn, Serum: 249 mg/dL (ref 64–422)
IgM, Qn, Serum: 9 mg/dL — ABNORMAL LOW (ref 26–217)
M PROTEIN SERPL ELPH-MCNC: 0.4 g/dL — AB
Total Protein: 6 g/dL (ref 6.0–8.5)

## 2017-03-31 ENCOUNTER — Other Ambulatory Visit (HOSPITAL_BASED_OUTPATIENT_CLINIC_OR_DEPARTMENT_OTHER): Payer: Medicare HMO

## 2017-03-31 ENCOUNTER — Ambulatory Visit (HOSPITAL_BASED_OUTPATIENT_CLINIC_OR_DEPARTMENT_OTHER): Payer: Medicare HMO

## 2017-03-31 ENCOUNTER — Encounter: Payer: Self-pay | Admitting: Hematology and Oncology

## 2017-03-31 ENCOUNTER — Telehealth: Payer: Self-pay | Admitting: Hematology and Oncology

## 2017-03-31 ENCOUNTER — Ambulatory Visit (HOSPITAL_BASED_OUTPATIENT_CLINIC_OR_DEPARTMENT_OTHER): Payer: Medicare HMO | Admitting: Hematology and Oncology

## 2017-03-31 VITALS — BP 130/75 | HR 64 | Temp 97.8°F | Resp 18 | Ht 63.0 in | Wt 148.4 lb

## 2017-03-31 DIAGNOSIS — R040 Epistaxis: Secondary | ICD-10-CM | POA: Diagnosis not present

## 2017-03-31 DIAGNOSIS — C9001 Multiple myeloma in remission: Secondary | ICD-10-CM

## 2017-03-31 DIAGNOSIS — M48061 Spinal stenosis, lumbar region without neurogenic claudication: Secondary | ICD-10-CM

## 2017-03-31 DIAGNOSIS — Z5112 Encounter for antineoplastic immunotherapy: Secondary | ICD-10-CM

## 2017-03-31 DIAGNOSIS — G893 Neoplasm related pain (acute) (chronic): Secondary | ICD-10-CM | POA: Diagnosis not present

## 2017-03-31 LAB — CBC WITH DIFFERENTIAL/PLATELET
BASO%: 2.2 % — ABNORMAL HIGH (ref 0.0–2.0)
BASOS ABS: 0.1 10*3/uL (ref 0.0–0.1)
EOS%: 4.6 % (ref 0.0–7.0)
Eosinophils Absolute: 0.2 10*3/uL (ref 0.0–0.5)
HCT: 40 % (ref 34.8–46.6)
HEMOGLOBIN: 13.3 g/dL (ref 11.6–15.9)
LYMPH#: 2 10*3/uL (ref 0.9–3.3)
LYMPH%: 41.9 % (ref 14.0–49.7)
MCH: 30.7 pg (ref 25.1–34.0)
MCHC: 33.3 g/dL (ref 31.5–36.0)
MCV: 92.3 fL (ref 79.5–101.0)
MONO#: 0.7 10*3/uL (ref 0.1–0.9)
MONO%: 14.4 % — ABNORMAL HIGH (ref 0.0–14.0)
NEUT%: 36.9 % — ABNORMAL LOW (ref 38.4–76.8)
NEUTROS ABS: 1.7 10*3/uL (ref 1.5–6.5)
Platelets: 155 10*3/uL (ref 145–400)
RBC: 4.34 10*6/uL (ref 3.70–5.45)
RDW: 16.4 % — ABNORMAL HIGH (ref 11.2–14.5)
WBC: 4.7 10*3/uL (ref 3.9–10.3)

## 2017-03-31 LAB — COMPREHENSIVE METABOLIC PANEL
ALT: 17 U/L (ref 0–55)
ANION GAP: 11 meq/L (ref 3–11)
AST: 17 U/L (ref 5–34)
Albumin: 3.8 g/dL (ref 3.5–5.0)
Alkaline Phosphatase: 50 U/L (ref 40–150)
BILIRUBIN TOTAL: 0.65 mg/dL (ref 0.20–1.20)
BUN: 15.4 mg/dL (ref 7.0–26.0)
CHLORIDE: 109 meq/L (ref 98–109)
CO2: 23 meq/L (ref 22–29)
Calcium: 9.2 mg/dL (ref 8.4–10.4)
Creatinine: 0.7 mg/dL (ref 0.6–1.1)
EGFR: 81 mL/min/{1.73_m2} — AB (ref >90)
Glucose: 90 mg/dl (ref 70–140)
Potassium: 3.6 mEq/L (ref 3.5–5.1)
Sodium: 144 mEq/L (ref 136–145)
TOTAL PROTEIN: 6.6 g/dL (ref 6.4–8.3)

## 2017-03-31 MED ORDER — PROCHLORPERAZINE MALEATE 10 MG PO TABS
10.0000 mg | ORAL_TABLET | Freq: Once | ORAL | Status: AC
  Administered 2017-03-31: 10 mg via ORAL

## 2017-03-31 MED ORDER — PROCHLORPERAZINE MALEATE 10 MG PO TABS
ORAL_TABLET | ORAL | Status: AC
  Filled 2017-03-31: qty 1

## 2017-03-31 MED ORDER — OXYCODONE-ACETAMINOPHEN 5-325 MG PO TABS: 1.0000 | ORAL_TABLET | Freq: Four times a day (QID) | ORAL | 0 refills | Status: DC | PRN

## 2017-03-31 MED ORDER — BORTEZOMIB CHEMO SQ INJECTION 3.5 MG (2.5MG/ML)
0.9750 mg/m2 | Freq: Once | INTRAMUSCULAR | Status: AC
  Administered 2017-03-31: 1.5 mg via SUBCUTANEOUS
  Filled 2017-03-31: qty 1.5

## 2017-03-31 MED FILL — OXYCODONE-ACETAMINOPHEN 5-3: 5-325 | 22 days supply | Qty: 90 | Fill #0

## 2017-03-31 NOTE — Patient Instructions (Signed)
[facility] Discharge Instructions for Patients Receiving Chemotherapy  Today you received the following chemotherapy agents Velcade. To help prevent nausea and vomiting after your treatment, we encourage you to take your nausea medication as directed.  If you develop nausea and vomiting that is not controlled by your nausea medication, call the clinic.   BELOW ARE SYMPTOMS THAT SHOULD BE REPORTED IMMEDIATELY:  *FEVER GREATER THAN 100.5 F  *CHILLS WITH OR WITHOUT FEVER  NAUSEA AND VOMITING THAT IS NOT CONTROLLED WITH YOUR NAUSEA MEDICATION  *UNUSUAL SHORTNESS OF BREATH  *UNUSUAL BRUISING OR BLEEDING  TENDERNESS IN MOUTH AND THROAT WITH OR WITHOUT PRESENCE OF ULCERS  *URINARY PROBLEMS  *BOWEL PROBLEMS  UNUSUAL RASH Items with * indicate a potential emergency and should be followed up as soon as possible.  Feel free to call the clinic you have any questions or concerns. The clinic phone number is [phone].  Please show the CHEMO ALERT CARD at check-in to the Emergency Department and triage nurse.    

## 2017-03-31 NOTE — Progress Notes (Signed)
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 Revlimid was stopped in July and resumed in October      04/01/2016 Adverse Reaction    Treatment was placed on hold temporarily due to neutropenia       INTERVAL HISTORY: Please see below for problem oriented charting. She returns with her sisters for further follow-up. She complained of intermittent epistaxis twice last month, self resolving.  She denies peripheral neuropathy No recent worsening pain.  No recent infection.  REVIEW OF SYSTEMS:   Constitutional: Denies fevers, chills or abnormal weight loss Eyes: Denies blurriness of vision Ears, nose, mouth, throat, and face: Denies mucositis or sore throat Respiratory: Denies cough, dyspnea or wheezes Cardiovascular: Denies palpitation, chest discomfort or lower extremity swelling Gastrointestinal:  Denies nausea, heartburn or change in bowel habits Skin: Denies abnormal skin rashes Lymphatics: Denies new lymphadenopathy or easy bruising Neurological:Denies numbness, tingling or new weaknesses Behavioral/Psych: Mood is stable, no new changes  All other systems were reviewed with the patient and are negative.  I have reviewed the past medical history, past surgical history, social history and family history with the patient and they are  unchanged from previous note.  ALLERGIES:  is allergic to aspirin; pravastatin; and zocor [simvastatin].  MEDICATIONS:  Current Outpatient Prescriptions  Medication Sig Dispense Refill  . acyclovir (ZOVIRAX) 400 MG tablet Take 1 tablet (400 mg total) by mouth daily. 90 tablet 3  . dexamethasone (DECADRON) 4 MG tablet Take 1 tablet (4 mg total) by mouth daily. 60 tablet 1  . lenalidomide (REVLIMID) 5 MG capsule Take 1 capsule (5 mg total) by mouth daily. 21 days on and 7 days off. 21 capsule 0  . lisinopril (PRINIVIL,ZESTRIL) 40 MG tablet Take 1 tablet (40 mg total) by mouth daily. 30 tablet 11  . oxyCODONE-acetaminophen (ROXICET) 5-325 MG tablet Take 1 tablet by mouth every 6 (six) hours as needed for severe pain. 90 tablet 0  . terbinafine (LAMISIL) 250 MG tablet TAKE 1 TABLET (250 MG TOTAL) BY MOUTH DAILY. 30 tablet 2  . morphine (MS CONTIN) 15 MG 12 hr tablet Take 1 tablet (15 mg total) by mouth every 12 (twelve) hours. (Patient not taking: Reported on 01/06/2017) 60 tablet 0   No current facility-administered medications for this visit.     PHYSICAL EXAMINATION: ECOG PERFORMANCE STATUS: 1 - Symptomatic but completely ambulatory  Vitals:   03/31/17 0926  BP: 130/75  Pulse: 64  Resp: 18  Temp: 97.8 F (36.6 C)   Filed Weights   03/31/17 0926  Weight: 148 lb 6.4 oz (67.3 kg)    GENERAL:alert, no distress and comfortable SKIN: skin color, texture, turgor are normal, no rashes or significant lesions EYES: normal, Conjunctiva are pink and non-injected, sclera clear OROPHARYNX:no exudate, no erythema  and lips, buccal mucosa, and tongue normal  NECK: supple, thyroid normal size, non-tender, without nodularity LYMPH:  no palpable lymphadenopathy in the cervical, axillary or inguinal LUNGS: clear to auscultation and percussion with normal breathing effort HEART: regular rate & rhythm and no murmurs and no lower extremity edema ABDOMEN:abdomen soft, non-tender and normal bowel  sounds Musculoskeletal:no cyanosis of digits and no clubbing  NEURO: alert & oriented x 3 with fluent speech, no focal motor/sensory deficits  LABORATORY DATA:  I have reviewed the data as listed    Component Value Date/Time   NA 144 03/31/2017 0904   K 3.6 03/31/2017 0904   CL 107 10/06/2016 0526   CO2 23 03/31/2017 0904   GLUCOSE 90 03/31/2017 0904   BUN 15.4 03/31/2017 0904   CREATININE 0.7 03/31/2017 0904   CALCIUM 9.2 03/31/2017 0904   PROT 6.6 03/31/2017 0904   ALBUMIN 3.8 03/31/2017 0904   AST 17 03/31/2017 0904   ALT 17 03/31/2017 0904   ALKPHOS 50 03/31/2017 0904   BILITOT 0.65 03/31/2017 0904   GFRNONAA >60 10/06/2016 0526   GFRNONAA 64 01/17/2016 0910   GFRAA >60 10/06/2016 0526   GFRAA 74 01/17/2016 0910    No results found for: SPEP, UPEP  Lab Results  Component Value Date   WBC 4.7 03/31/2017   NEUTROABS 1.7 03/31/2017   HGB 13.3 03/31/2017   HCT 40.0 03/31/2017   MCV 92.3 03/31/2017   PLT 155 03/31/2017      Chemistry      Component Value Date/Time   NA 144 03/31/2017 0904   K 3.6 03/31/2017 0904   CL 107 10/06/2016 0526   CO2 23 03/31/2017 0904   BUN 15.4 03/31/2017 0904   CREATININE 0.7 03/31/2017 0904      Component Value Date/Time   CALCIUM 9.2 03/31/2017 0904   ALKPHOS 50 03/31/2017 0904   AST 17 03/31/2017 0904   ALT 17 03/31/2017 0904   BILITOT 0.65 03/31/2017 0904      ASSESSMENT & PLAN:  Multiple myeloma in remission ([place]) Her last 2 myeloma panel indicated mild disease progression. She is not symptomatic. There are no signs of pancytopenia, hypercalcemia or renal failure I recommend we continue treatment without dose adjustment but I plan to repeat her myeloma panel again next week. If it continues to get worse, we might want to consider increasing the dose of Revlimid on switching treatment.  Cancer associated pain Her cancer pain is improving with improved disease control. She is not taking much MS Contin. We will  continue current pain management without changes.  I refill some of her pain medications today.  Epistaxis She has intermittent epistaxis. They were self-limiting. If the epistaxis get worse, we can consider ENT consult For now, I recommend conservative management   Orders Placed This Encounter  Procedures  . Kappa/lambda light chains    Standing Status:   Future    Standing Expiration Date:   05/05/2018  . Multiple Myeloma Panel (SPEP&IFE w/QIG)    Standing Status:   Future    Standing Expiration Date:   05/05/2018   All questions were answered. The patient knows to call the clinic with any problems, questions or concerns. No barriers to learning was detected. I spent 15 minutes counseling the patient face to face. The total time spent in the appointment was 20 minutes and more than 50% was on counseling and review of test results     Heath Lark, MD 03/31/2017 4:42 PM

## 2017-03-31 NOTE — Telephone Encounter (Signed)
Gave relative avs report and appointments for May and June.

## 2017-03-31 NOTE — Assessment & Plan Note (Signed)
She has intermittent epistaxis. They were self-limiting. If the epistaxis get worse, we can consider ENT consult For now, I recommend conservative management

## 2017-03-31 NOTE — Assessment & Plan Note (Signed)
Her cancer pain is improving with improved disease control. She is not taking much MS Contin. We will continue current pain management without changes.  I refill some of her pain medications today.

## 2017-03-31 NOTE — Assessment & Plan Note (Signed)
Her last 2 myeloma panel indicated mild disease progression. She is not symptomatic. There are no signs of pancytopenia, hypercalcemia or renal failure I recommend we continue treatment without dose adjustment but I plan to repeat her myeloma panel again next week. If it continues to get worse, we might want to consider increasing the dose of Revlimid on switching treatment.

## 2017-04-01 MED FILL — LISINOPRIL 40 MG TABLET: 40 | 30 days supply | Qty: 30 | Fill #6

## 2017-04-10 ENCOUNTER — Other Ambulatory Visit (HOSPITAL_BASED_OUTPATIENT_CLINIC_OR_DEPARTMENT_OTHER): Payer: Medicare HMO

## 2017-04-10 ENCOUNTER — Ambulatory Visit (HOSPITAL_BASED_OUTPATIENT_CLINIC_OR_DEPARTMENT_OTHER): Payer: Medicare HMO

## 2017-04-10 VITALS — BP 129/80 | HR 65 | Temp 97.7°F | Resp 18

## 2017-04-10 DIAGNOSIS — C9001 Multiple myeloma in remission: Secondary | ICD-10-CM

## 2017-04-10 DIAGNOSIS — Z5112 Encounter for antineoplastic immunotherapy: Secondary | ICD-10-CM | POA: Diagnosis not present

## 2017-04-10 LAB — CBC WITH DIFFERENTIAL/PLATELET
BASO%: 1.3 % (ref 0.0–2.0)
BASOS ABS: 0.1 10*3/uL (ref 0.0–0.1)
EOS%: 3.6 % (ref 0.0–7.0)
Eosinophils Absolute: 0.2 10*3/uL (ref 0.0–0.5)
HCT: 39.1 % (ref 34.8–46.6)
HGB: 12.8 g/dL (ref 11.6–15.9)
LYMPH%: 26.2 % (ref 14.0–49.7)
MCH: 30.7 pg (ref 25.1–34.0)
MCHC: 32.7 g/dL (ref 31.5–36.0)
MCV: 93.8 fL (ref 79.5–101.0)
MONO#: 0.5 10*3/uL (ref 0.1–0.9)
MONO%: 9.2 % (ref 0.0–14.0)
NEUT#: 3.1 10*3/uL (ref 1.5–6.5)
NEUT%: 59.7 % (ref 38.4–76.8)
Platelets: 197 10*3/uL (ref 145–400)
RBC: 4.17 10*6/uL (ref 3.70–5.45)
RDW: 15.6 % — ABNORMAL HIGH (ref 11.2–14.5)
WBC: 5.2 10*3/uL (ref 3.9–10.3)
lymph#: 1.4 10*3/uL (ref 0.9–3.3)

## 2017-04-10 LAB — COMPREHENSIVE METABOLIC PANEL
ALK PHOS: 49 U/L (ref 40–150)
ALT: 17 U/L (ref 0–55)
ANION GAP: 10 meq/L (ref 3–11)
AST: 19 U/L (ref 5–34)
Albumin: 3.7 g/dL (ref 3.5–5.0)
BILIRUBIN TOTAL: 0.44 mg/dL (ref 0.20–1.20)
BUN: 13.9 mg/dL (ref 7.0–26.0)
CO2: 26 mEq/L (ref 22–29)
CREATININE: 0.9 mg/dL (ref 0.6–1.1)
Calcium: 9.1 mg/dL (ref 8.4–10.4)
Chloride: 110 mEq/L — ABNORMAL HIGH (ref 98–109)
EGFR: 63 mL/min/{1.73_m2} — AB (ref >90)
GLUCOSE: 93 mg/dL (ref 70–140)
Potassium: 3.7 mEq/L (ref 3.5–5.1)
SODIUM: 145 meq/L (ref 136–145)
TOTAL PROTEIN: 6.3 g/dL — AB (ref 6.4–8.3)

## 2017-04-10 MED ORDER — BORTEZOMIB CHEMO SQ INJECTION 3.5 MG (2.5MG/ML)
0.9750 mg/m2 | Freq: Once | INTRAMUSCULAR | Status: AC
  Administered 2017-04-10: 1.5 mg via SUBCUTANEOUS
  Filled 2017-04-10: qty 1.5

## 2017-04-10 MED ORDER — PROCHLORPERAZINE MALEATE 10 MG PO TABS
ORAL_TABLET | ORAL | Status: AC
Start: 2017-04-10 — End: 2017-04-10
  Filled 2017-04-10: qty 1

## 2017-04-10 MED ORDER — PROCHLORPERAZINE MALEATE 10 MG PO TABS
10.0000 mg | ORAL_TABLET | Freq: Once | ORAL | Status: AC
  Administered 2017-04-10: 10 mg via ORAL

## 2017-04-10 NOTE — Patient Instructions (Signed)
[facility] Discharge Instructions for Patients Receiving Chemotherapy  Today you received the following chemotherapy agents: bortezomib (Velcade).  To help prevent nausea and vomiting after your treatment, we encourage you to take your nausea medication as directed  If you develop nausea and vomiting that is not controlled by your nausea medication, call the clinic.   BELOW ARE SYMPTOMS THAT SHOULD BE REPORTED IMMEDIATELY:  *FEVER GREATER THAN 100.5 F  *CHILLS WITH OR WITHOUT FEVER  NAUSEA AND VOMITING THAT IS NOT CONTROLLED WITH YOUR NAUSEA MEDICATION  *UNUSUAL SHORTNESS OF BREATH  *UNUSUAL BRUISING OR BLEEDING  TENDERNESS IN MOUTH AND THROAT WITH OR WITHOUT PRESENCE OF ULCERS  *URINARY PROBLEMS  *BOWEL PROBLEMS  UNUSUAL RASH Items with * indicate a potential emergency and should be followed up as soon as possible.  Feel free to call the clinic you have any questions or concerns. The clinic phone number is [phone].  Please show the CHEMO ALERT CARD at check-in to the Emergency Department and triage nurse.     

## 2017-04-14 ENCOUNTER — Telehealth: Payer: Self-pay | Admitting: *Deleted

## 2017-04-14 ENCOUNTER — Other Ambulatory Visit: Payer: Self-pay

## 2017-04-14 DIAGNOSIS — C9001 Multiple myeloma in remission: Secondary | ICD-10-CM

## 2017-04-14 LAB — KAPPA/LAMBDA LIGHT CHAINS
Ig Kappa Free Light Chain: 220.3 mg/L — ABNORMAL HIGH (ref 3.3–19.4)
Ig Lambda Free Light Chain: 7.5 mg/L (ref 5.7–26.3)
Kappa/Lambda FluidC Ratio: 29.37 — ABNORMAL HIGH (ref 0.26–1.65)

## 2017-04-14 MED ORDER — LENALIDOMIDE 5 MG PO CAPS: 5.0000 mg | ORAL_CAPSULE | Freq: Every day | ORAL | 0 refills | Status: DC

## 2017-04-14 NOTE — Telephone Encounter (Signed)
FYI "My sister called me this morning reporting she is stopped up with cold and phlegm that started this weekend.  She is hoarse.  May be sinuses stopped up.  Can I give her Coricidin for her symptoms?  What can she take over the counter?  She takes Lisinopril for B/P.  Says she does not have a fever.  Dr. Dennard Schaumann has told her to use Claritin for seasonal allergies."  Sister unable to answer further assessment questions.  Will go to the home, check her temperature, color of phlegm and mouth.  awaiting return call with further information.

## 2017-04-14 NOTE — Telephone Encounter (Signed)
Called with below message. 

## 2017-04-14 NOTE — Telephone Encounter (Signed)
Her symptoms are consistent with cold/allergies Any OTC remedies, robitussin, Coricidin are acceptable. Would recommend nasacort for sinus as well

## 2017-04-15 LAB — MULTIPLE MYELOMA PANEL, SERUM
ALPHA 1: 0.2 g/dL (ref 0.0–0.4)
ALPHA2 GLOB SERPL ELPH-MCNC: 0.8 g/dL (ref 0.4–1.0)
Albumin SerPl Elph-Mcnc: 3.5 g/dL (ref 2.9–4.4)
Albumin/Glob SerPl: 1.4 (ref 0.7–1.7)
B-GLOBULIN SERPL ELPH-MCNC: 1.2 g/dL (ref 0.7–1.3)
Gamma Glob SerPl Elph-Mcnc: 0.4 g/dL (ref 0.4–1.8)
Globulin, Total: 2.6 g/dL (ref 2.2–3.9)
IGG (IMMUNOGLOBIN G), SERUM: 408 mg/dL — AB (ref 700–1600)
IGM (IMMUNOGLOBIN M), SRM: 10 mg/dL — AB (ref 26–217)
IgA, Qn, Serum: 272 mg/dL (ref 64–422)
M PROTEIN SERPL ELPH-MCNC: 0.4 g/dL — AB
TOTAL PROTEIN: 6.1 g/dL (ref 6.0–8.5)

## 2017-04-21 ENCOUNTER — Other Ambulatory Visit: Payer: Self-pay | Admitting: *Deleted

## 2017-04-21 ENCOUNTER — Other Ambulatory Visit (HOSPITAL_BASED_OUTPATIENT_CLINIC_OR_DEPARTMENT_OTHER): Payer: Medicare HMO

## 2017-04-21 ENCOUNTER — Ambulatory Visit (HOSPITAL_BASED_OUTPATIENT_CLINIC_OR_DEPARTMENT_OTHER): Payer: Medicare HMO | Admitting: Hematology and Oncology

## 2017-04-21 ENCOUNTER — Ambulatory Visit (HOSPITAL_BASED_OUTPATIENT_CLINIC_OR_DEPARTMENT_OTHER): Payer: Medicare HMO

## 2017-04-21 ENCOUNTER — Telehealth: Payer: Self-pay | Admitting: Hematology and Oncology

## 2017-04-21 VITALS — BP 127/74 | HR 75 | Temp 98.7°F | Resp 18 | Ht 63.0 in | Wt 144.4 lb

## 2017-04-21 DIAGNOSIS — C9001 Multiple myeloma in remission: Secondary | ICD-10-CM

## 2017-04-21 DIAGNOSIS — R05 Cough: Secondary | ICD-10-CM | POA: Diagnosis not present

## 2017-04-21 DIAGNOSIS — R0981 Nasal congestion: Secondary | ICD-10-CM | POA: Diagnosis not present

## 2017-04-21 DIAGNOSIS — C9002 Multiple myeloma in relapse: Secondary | ICD-10-CM | POA: Diagnosis not present

## 2017-04-21 DIAGNOSIS — Z7189 Other specified counseling: Secondary | ICD-10-CM

## 2017-04-21 DIAGNOSIS — G893 Neoplasm related pain (acute) (chronic): Secondary | ICD-10-CM | POA: Diagnosis not present

## 2017-04-21 DIAGNOSIS — B349 Viral infection, unspecified: Secondary | ICD-10-CM

## 2017-04-21 DIAGNOSIS — R413 Other amnesia: Secondary | ICD-10-CM

## 2017-04-21 DIAGNOSIS — Z5112 Encounter for antineoplastic immunotherapy: Secondary | ICD-10-CM | POA: Diagnosis not present

## 2017-04-21 LAB — CBC WITH DIFFERENTIAL/PLATELET
BASO%: 1.4 % (ref 0.0–2.0)
BASOS ABS: 0.1 10*3/uL (ref 0.0–0.1)
EOS%: 2.3 % (ref 0.0–7.0)
Eosinophils Absolute: 0.1 10*3/uL (ref 0.0–0.5)
HEMATOCRIT: 38.9 % (ref 34.8–46.6)
HGB: 12.8 g/dL (ref 11.6–15.9)
LYMPH%: 36.7 % (ref 14.0–49.7)
MCH: 30.4 pg (ref 25.1–34.0)
MCHC: 32.9 g/dL (ref 31.5–36.0)
MCV: 92.5 fL (ref 79.5–101.0)
MONO#: 0.6 10*3/uL (ref 0.1–0.9)
MONO%: 14.2 % — ABNORMAL HIGH (ref 0.0–14.0)
NEUT#: 1.8 10*3/uL (ref 1.5–6.5)
NEUT%: 45.4 % (ref 38.4–76.8)
PLATELETS: 148 10*3/uL (ref 145–400)
RBC: 4.21 10*6/uL (ref 3.70–5.45)
RDW: 16.3 % — ABNORMAL HIGH (ref 11.2–14.5)
WBC: 3.9 10*3/uL (ref 3.9–10.3)
lymph#: 1.4 10*3/uL (ref 0.9–3.3)

## 2017-04-21 LAB — COMPREHENSIVE METABOLIC PANEL
ALT: 14 U/L (ref 0–55)
AST: 19 U/L (ref 5–34)
Albumin: 3.7 g/dL (ref 3.5–5.0)
Alkaline Phosphatase: 57 U/L (ref 40–150)
Anion Gap: 9 mEq/L (ref 3–11)
BILIRUBIN TOTAL: 0.53 mg/dL (ref 0.20–1.20)
BUN: 11.2 mg/dL (ref 7.0–26.0)
CO2: 26 meq/L (ref 22–29)
Calcium: 9.2 mg/dL (ref 8.4–10.4)
Chloride: 108 mEq/L (ref 98–109)
Creatinine: 0.8 mg/dL (ref 0.6–1.1)
EGFR: 76 mL/min/{1.73_m2} — AB (ref >90)
Glucose: 96 mg/dl (ref 70–140)
Potassium: 3.7 mEq/L (ref 3.5–5.1)
Sodium: 143 mEq/L (ref 136–145)
TOTAL PROTEIN: 6.6 g/dL (ref 6.4–8.3)

## 2017-04-21 MED ORDER — BORTEZOMIB CHEMO SQ INJECTION 3.5 MG (2.5MG/ML)
0.9750 mg/m2 | Freq: Once | INTRAMUSCULAR | Status: AC
  Administered 2017-04-21: 1.5 mg via SUBCUTANEOUS
  Filled 2017-04-21: qty 1.5

## 2017-04-21 MED ORDER — LENALIDOMIDE 10 MG PO CAPS: 10.0000 mg | ORAL_CAPSULE | Freq: Every day | ORAL | 9 refills | Status: DC

## 2017-04-21 MED ORDER — PROCHLORPERAZINE MALEATE 10 MG PO TABS
10.0000 mg | ORAL_TABLET | Freq: Once | ORAL | Status: AC
  Administered 2017-04-21: 10 mg via ORAL

## 2017-04-21 MED ORDER — PROCHLORPERAZINE MALEATE 10 MG PO TABS
ORAL_TABLET | ORAL | Status: AC
  Filled 2017-04-21: qty 1

## 2017-04-21 MED ORDER — AMOXICILLIN-POT CLAVULANATE 875-125 MG PO TABS: 1.0000 | ORAL_TABLET | Freq: Two times a day (BID) | ORAL | 0 refills | Status: DC

## 2017-04-21 MED FILL — AMOX TR-K CLV 875-125 MG TA: 875-125 | 7 days supply | Qty: 14 | Fill #0

## 2017-04-21 NOTE — Patient Instructions (Signed)
[facility] Discharge Instructions for Patients Receiving Chemotherapy  Today you received the following chemotherapy agents: bortezomib (Velcade).  To help prevent nausea and vomiting after your treatment, we encourage you to take your nausea medication as directed  If you develop nausea and vomiting that is not controlled by your nausea medication, call the clinic.   BELOW ARE SYMPTOMS THAT SHOULD BE REPORTED IMMEDIATELY:  *FEVER GREATER THAN 100.5 F  *CHILLS WITH OR WITHOUT FEVER  NAUSEA AND VOMITING THAT IS NOT CONTROLLED WITH YOUR NAUSEA MEDICATION  *UNUSUAL SHORTNESS OF BREATH  *UNUSUAL BRUISING OR BLEEDING  TENDERNESS IN MOUTH AND THROAT WITH OR WITHOUT PRESENCE OF ULCERS  *URINARY PROBLEMS  *BOWEL PROBLEMS  UNUSUAL RASH Items with * indicate a potential emergency and should be followed up as soon as possible.  Feel free to call the clinic you have any questions or concerns. The clinic phone number is [phone].  Please show the CHEMO ALERT CARD at check-in to the Emergency Department and triage nurse.     

## 2017-04-21 NOTE — Telephone Encounter (Signed)
Scheduled appt per 6/5 los - no treatment plan in yet - scheduled per past appts. Gave patient AVS and calender

## 2017-04-22 ENCOUNTER — Encounter: Payer: Self-pay | Admitting: Hematology and Oncology

## 2017-04-22 LAB — KAPPA/LAMBDA LIGHT CHAINS
IG LAMBDA FREE LIGHT CHAIN: 6.9 mg/L (ref 5.7–26.3)
Ig Kappa Free Light Chain: 302.6 mg/L — ABNORMAL HIGH (ref 3.3–19.4)
Kappa/Lambda FluidC Ratio: 43.86 — ABNORMAL HIGH (ref 0.26–1.65)

## 2017-04-22 NOTE — Assessment & Plan Note (Signed)
Her cancer pain is improving with improved disease control. She is not taking much MS Contin. We will continue current pain management without changes.  

## 2017-04-22 NOTE — Assessment & Plan Note (Signed)
She has poor memory and signs of dementia. I do not believe she is capable of making her own medical decision and I defer to her sister to make decision for her. According to his sister, she has good compliance taking all the medications as instructed

## 2017-04-22 NOTE — Assessment & Plan Note (Signed)
We discussed goals of care. For now, the patient feels well. We discussed the importance of advanced directives and appointment of dedicated medical healthcare power of attorney. Her sister brought paperwork signed for dedicated medical power of attorney For now, they want her to remain on aggressive treatment

## 2017-04-22 NOTE — Progress Notes (Signed)
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 Revlimid was stopped in July and resumed in October      04/01/2016 Adverse Reaction    Treatment was placed on hold temporarily due to neutropenia       INTERVAL HISTORY: Please see below for problem oriented charting. She is seen accompanied by her 2 sisters. She had cold like illness recently with nasal congestion but no significant fever or chills.  She has some cough. He has not resolved for over a week. She denies significant bone pain. She have lost some weight.  REVIEW OF SYSTEMS:   Constitutional: Denies fevers, chills or abnormal weight loss Eyes: Denies blurriness of vision Ears, nose, mouth, throat, and face: Denies mucositis or sore throat Cardiovascular: Denies palpitation, chest discomfort or lower extremity swelling Gastrointestinal:  Denies nausea, heartburn or change in bowel habits Skin: Denies abnormal skin rashes Lymphatics: Denies new lymphadenopathy or easy bruising Neurological:Denies numbness, tingling or new weaknesses Behavioral/Psych: Mood is stable, no new changes  All other systems were reviewed with the patient and are negative.  I have reviewed the past medical history, past surgical history, social history and family history with the patient and they are  unchanged from previous note.  ALLERGIES:  is allergic to aspirin; pravastatin; and zocor [simvastatin].  MEDICATIONS:  Current Outpatient Prescriptions  Medication Sig Dispense Refill  . acyclovir (ZOVIRAX) 400 MG tablet Take 1 tablet (400 mg total) by mouth daily. 90 tablet 3  . dexamethasone (DECADRON) 4 MG tablet Take 1 tablet (4 mg total) by mouth daily. 60 tablet 1  . lisinopril (PRINIVIL,ZESTRIL) 40 MG tablet Take 1 tablet (40 mg total) by mouth daily. 30 tablet 11  . oxyCODONE-acetaminophen (ROXICET) 5-325 MG tablet Take 1 tablet by mouth every 6 (six) hours as needed for severe pain. 90 tablet 0  . terbinafine (LAMISIL) 250 MG tablet TAKE 1 TABLET (250 MG TOTAL) BY MOUTH DAILY. 30 tablet 2  . amoxicillin-clavulanate (AUGMENTIN) 875-125 MG tablet Take 1 tablet by mouth 2 (two) times daily. 14 tablet 0  . lenalidomide (REVLIMID) 10 MG capsule Take 1 capsule (10 mg total) by mouth daily. 21 days on and 7 days off. 21 capsule 9  . morphine (MS CONTIN) 15 MG 12 hr tablet Take 1 tablet (15 mg total) by mouth every 12 (twelve) hours. (Patient not taking: Reported on 04/21/2017) 60 tablet 0   No current facility-administered medications for this visit.     PHYSICAL EXAMINATION: ECOG PERFORMANCE STATUS: 1 - Symptomatic but completely ambulatory  Vitals:   04/21/17 1022  BP: 127/74  Pulse: 75  Resp: 18  Temp: 98.7 F (37.1 C)   Filed Weights   04/21/17 1022  Weight: 144 lb 6.4 oz (65.5 kg)    GENERAL:alert, no  distress and comfortable SKIN: skin color, texture, turgor are normal, no rashes or significant lesions EYES: normal, Conjunctiva are pink and non-injected, sclera clear OROPHARYNX:no exudate, no erythema and lips, buccal mucosa, and tongue normal  NECK: supple, thyroid normal size, non-tender, without nodularity LYMPH:  no palpable lymphadenopathy in the cervical, axillary or inguinal LUNGS: clear to auscultation and percussion with normal breathing effort HEART: regular  rate & rhythm and no murmurs and no lower extremity edema ABDOMEN:abdomen soft, non-tender and normal bowel sounds Musculoskeletal:no cyanosis of digits and no clubbing  NEURO: alert & oriented x 3 with fluent speech, no focal motor/sensory deficits  LABORATORY DATA:  I have reviewed the data as listed    Component Value Date/Time   NA 143 04/21/2017 1005   K 3.7 04/21/2017 1005   CL 107 10/06/2016 0526   CO2 26 04/21/2017 1005   GLUCOSE 96 04/21/2017 1005   BUN 11.2 04/21/2017 1005   CREATININE 0.8 04/21/2017 1005   CALCIUM 9.2 04/21/2017 1005   PROT 6.6 04/21/2017 1005   ALBUMIN 3.7 04/21/2017 1005   AST 19 04/21/2017 1005   ALT 14 04/21/2017 1005   ALKPHOS 57 04/21/2017 1005   BILITOT 0.53 04/21/2017 1005   GFRNONAA >60 10/06/2016 0526   GFRNONAA 64 01/17/2016 0910   GFRAA >60 10/06/2016 0526   GFRAA 74 01/17/2016 0910    No results found for: SPEP, UPEP  Lab Results  Component Value Date   WBC 3.9 04/21/2017   NEUTROABS 1.8 04/21/2017   HGB 12.8 04/21/2017   HCT 38.9 04/21/2017   MCV 92.5 04/21/2017   PLT 148 04/21/2017      Chemistry      Component Value Date/Time   NA 143 04/21/2017 1005   K 3.7 04/21/2017 1005   CL 107 10/06/2016 0526   CO2 26 04/21/2017 1005   BUN 11.2 04/21/2017 1005   CREATININE 0.8 04/21/2017 1005      Component Value Date/Time   CALCIUM 9.2 04/21/2017 1005   ALKPHOS 57 04/21/2017 1005   AST 19 04/21/2017 1005   ALT 14 04/21/2017 1005   BILITOT 0.53 04/21/2017 1005     ASSESSMENT & PLAN:  Multiple myeloma in relapse ([facility]) Her last 2 myeloma panel indicated mild disease progression. She is not symptomatic. There are no signs of pancytopenia, hypercalcemia or renal failure I have a long discussion with the patient's family members. Ultimately, we made the decision to increase the dose of Revlimid and try for at least 3 months. We will continue Velcade every other week for now She will continue acyclovir for antimicrobial  prophylaxis along with calcium, vitamin D, Zometa and aspirin therapy  Cancer associated pain Her cancer pain is improving with improved disease control. She is not taking much MS Contin. We will continue current pain management without changes.   Memory deficits She has poor memory and signs of dementia. I do not believe she is capable of making her own medical decision and I defer to her sister to make decision for her. According to his sister, she has good compliance taking all the medications as instructed  Viral illness She has signs of mild viral illness. I am concerned that unresolved viral illness can proceed to become bacterial infection. I have prescribed oral antibiotic treatment but will proceed with treatment without delay  Goals of care, counseling/discussion We discussed goals of care. For now, the patient feels well. We discussed the importance of advanced directives and appointment of dedicated medical healthcare power  of attorney. Her sister brought paperwork signed for dedicated medical power of attorney For now, they want her to remain on aggressive treatment   No orders of the defined types were placed in this encounter.  All questions were answered. The patient knows to call the clinic with any problems, questions or concerns. No barriers to learning was detected. I spent 25 minutes counseling the patient face to face. The total time spent in the appointment was 30 minutes and more than 50% was on counseling and review of test results     Heath Lark, MD 04/22/2017 12:36 PM

## 2017-04-22 NOTE — Assessment & Plan Note (Addendum)
She has signs of mild viral illness. I am concerned that unresolved viral illness can proceed to become bacterial infection. I have prescribed oral antibiotic treatment but will proceed with treatment without delay

## 2017-04-22 NOTE — Assessment & Plan Note (Signed)
Her last 2 myeloma panel indicated mild disease progression. She is not symptomatic. There are no signs of pancytopenia, hypercalcemia or renal failure I have a long discussion with the patient's family members. Ultimately, we made the decision to increase the dose of Revlimid and try for at least 3 months. We will continue Velcade every other week for now She will continue acyclovir for antimicrobial prophylaxis along with calcium, vitamin D, Zometa and aspirin therapy

## 2017-04-23 LAB — MULTIPLE MYELOMA PANEL, SERUM
ALBUMIN/GLOB SERPL: 1.3 (ref 0.7–1.7)
ALPHA 1: 0.3 g/dL (ref 0.0–0.4)
ALPHA2 GLOB SERPL ELPH-MCNC: 0.9 g/dL (ref 0.4–1.0)
Albumin SerPl Elph-Mcnc: 3.5 g/dL (ref 2.9–4.4)
B-GLOBULIN SERPL ELPH-MCNC: 1.2 g/dL (ref 0.7–1.3)
GAMMA GLOB SERPL ELPH-MCNC: 0.4 g/dL (ref 0.4–1.8)
GLOBULIN, TOTAL: 2.8 g/dL (ref 2.2–3.9)
IGA/IMMUNOGLOBULIN A, SERUM: 246 mg/dL (ref 64–422)
IGG (IMMUNOGLOBIN G), SERUM: 435 mg/dL — AB (ref 700–1600)
IgM, Qn, Serum: 10 mg/dL — ABNORMAL LOW (ref 26–217)
M PROTEIN SERPL ELPH-MCNC: 0.3 g/dL — AB
Total Protein: 6.3 g/dL (ref 6.0–8.5)

## 2017-04-27 ENCOUNTER — Other Ambulatory Visit: Payer: Self-pay

## 2017-04-27 DIAGNOSIS — C9001 Multiple myeloma in remission: Secondary | ICD-10-CM

## 2017-04-27 MED ORDER — LENALIDOMIDE 10 MG PO CAPS: 10.0000 mg | ORAL_CAPSULE | Freq: Every day | ORAL | 9 refills | Status: DC

## 2017-04-27 MED FILL — LISINOPRIL 40 MG TABLET: 40 | 30 days supply | Qty: 30 | Fill #7

## 2017-04-27 MED FILL — ACYCLOVIR 400 MG TABLET: 400 | 90 days supply | Qty: 90 | Fill #3

## 2017-05-05 ENCOUNTER — Other Ambulatory Visit: Payer: Self-pay | Admitting: Hematology and Oncology

## 2017-05-05 ENCOUNTER — Other Ambulatory Visit (HOSPITAL_BASED_OUTPATIENT_CLINIC_OR_DEPARTMENT_OTHER): Payer: Medicare HMO

## 2017-05-05 ENCOUNTER — Ambulatory Visit (HOSPITAL_BASED_OUTPATIENT_CLINIC_OR_DEPARTMENT_OTHER): Payer: Medicare HMO

## 2017-05-05 VITALS — BP 135/65 | HR 55 | Temp 97.9°F | Resp 18

## 2017-05-05 DIAGNOSIS — C9001 Multiple myeloma in remission: Secondary | ICD-10-CM

## 2017-05-05 DIAGNOSIS — C9002 Multiple myeloma in relapse: Secondary | ICD-10-CM

## 2017-05-05 DIAGNOSIS — Z5112 Encounter for antineoplastic immunotherapy: Secondary | ICD-10-CM | POA: Diagnosis not present

## 2017-05-05 LAB — COMPREHENSIVE METABOLIC PANEL
ALT: 19 U/L (ref 0–55)
AST: 19 U/L (ref 5–34)
Albumin: 3.3 g/dL — ABNORMAL LOW (ref 3.5–5.0)
Alkaline Phosphatase: 47 U/L (ref 40–150)
Anion Gap: 7 mEq/L (ref 3–11)
BUN: 18.1 mg/dL (ref 7.0–26.0)
CALCIUM: 9.1 mg/dL (ref 8.4–10.4)
CHLORIDE: 109 meq/L (ref 98–109)
CO2: 26 mEq/L (ref 22–29)
Creatinine: 0.8 mg/dL (ref 0.6–1.1)
EGFR: 75 mL/min/{1.73_m2} — ABNORMAL LOW (ref >90)
Glucose: 111 mg/dl (ref 70–140)
POTASSIUM: 4.3 meq/L (ref 3.5–5.1)
Sodium: 142 mEq/L (ref 136–145)
Total Bilirubin: 0.38 mg/dL (ref 0.20–1.20)
Total Protein: 6 g/dL — ABNORMAL LOW (ref 6.4–8.3)

## 2017-05-05 LAB — CBC WITH DIFFERENTIAL/PLATELET
BASO%: 1 % (ref 0.0–2.0)
Basophils Absolute: 0.1 10*3/uL (ref 0.0–0.1)
EOS ABS: 0.1 10*3/uL (ref 0.0–0.5)
EOS%: 2.2 % (ref 0.0–7.0)
HEMATOCRIT: 37.6 % (ref 34.8–46.6)
HGB: 12.5 g/dL (ref 11.6–15.9)
LYMPH%: 21.5 % (ref 14.0–49.7)
MCH: 30.6 pg (ref 25.1–34.0)
MCHC: 33.2 g/dL (ref 31.5–36.0)
MCV: 92 fL (ref 79.5–101.0)
MONO#: 0.5 10*3/uL (ref 0.1–0.9)
MONO%: 8.9 % (ref 0.0–14.0)
NEUT%: 66.4 % (ref 38.4–76.8)
NEUTROS ABS: 3.9 10*3/uL (ref 1.5–6.5)
PLATELETS: 163 10*3/uL (ref 145–400)
RBC: 4.09 10*6/uL (ref 3.70–5.45)
RDW: 16.4 % — ABNORMAL HIGH (ref 11.2–14.5)
WBC: 5.8 10*3/uL (ref 3.9–10.3)
lymph#: 1.3 10*3/uL (ref 0.9–3.3)

## 2017-05-05 MED ORDER — PROCHLORPERAZINE MALEATE 10 MG PO TABS
10.0000 mg | ORAL_TABLET | Freq: Once | ORAL | Status: AC
  Administered 2017-05-05: 10 mg via ORAL

## 2017-05-05 MED ORDER — PROCHLORPERAZINE MALEATE 10 MG PO TABS
ORAL_TABLET | ORAL | Status: AC
  Filled 2017-05-05: qty 1

## 2017-05-05 MED ORDER — BORTEZOMIB CHEMO SQ INJECTION 3.5 MG (2.5MG/ML)
0.9750 mg/m2 | Freq: Once | INTRAMUSCULAR | Status: AC
  Administered 2017-05-05: 1.5 mg via SUBCUTANEOUS
  Filled 2017-05-05: qty 1.5

## 2017-05-05 NOTE — Patient Instructions (Signed)
[facility] Discharge Instructions for Patients Receiving Chemotherapy  Today you received the following chemotherapy agents Velcade  To help prevent nausea and vomiting after your treatment, we encourage you to take your nausea medication    If you develop nausea and vomiting that is not controlled by your nausea medication, call the clinic.   BELOW ARE SYMPTOMS THAT SHOULD BE REPORTED IMMEDIATELY:  *FEVER GREATER THAN 100.5 F  *CHILLS WITH OR WITHOUT FEVER  NAUSEA AND VOMITING THAT IS NOT CONTROLLED WITH YOUR NAUSEA MEDICATION  *UNUSUAL SHORTNESS OF BREATH  *UNUSUAL BRUISING OR BLEEDING  TENDERNESS IN MOUTH AND THROAT WITH OR WITHOUT PRESENCE OF ULCERS  *URINARY PROBLEMS  *BOWEL PROBLEMS  UNUSUAL RASH Items with * indicate a potential emergency and should be followed up as soon as possible.  Feel free to call the clinic you have any questions or concerns. The clinic phone number is [phone].  Please show the CHEMO ALERT CARD at check-in to the Emergency Department and triage nurse.   

## 2017-05-14 ENCOUNTER — Other Ambulatory Visit: Payer: Self-pay

## 2017-05-14 DIAGNOSIS — C9001 Multiple myeloma in remission: Secondary | ICD-10-CM

## 2017-05-14 MED ORDER — LENALIDOMIDE 10 MG PO CAPS: 10.0000 mg | ORAL_CAPSULE | Freq: Every day | ORAL | 0 refills | Status: DC

## 2017-05-18 ENCOUNTER — Other Ambulatory Visit: Payer: Self-pay | Admitting: *Deleted

## 2017-05-18 DIAGNOSIS — C9001 Multiple myeloma in remission: Secondary | ICD-10-CM

## 2017-05-18 MED ORDER — LENALIDOMIDE 10 MG PO CAPS: 10.0000 mg | ORAL_CAPSULE | Freq: Every day | ORAL | 0 refills | Status: AC

## 2017-05-19 ENCOUNTER — Ambulatory Visit (HOSPITAL_BASED_OUTPATIENT_CLINIC_OR_DEPARTMENT_OTHER): Payer: Medicare HMO

## 2017-05-19 ENCOUNTER — Other Ambulatory Visit: Payer: Self-pay | Admitting: *Deleted

## 2017-05-19 ENCOUNTER — Other Ambulatory Visit (HOSPITAL_BASED_OUTPATIENT_CLINIC_OR_DEPARTMENT_OTHER): Payer: Medicare HMO

## 2017-05-19 VITALS — BP 160/70 | HR 53 | Temp 97.8°F | Resp 18

## 2017-05-19 DIAGNOSIS — C9001 Multiple myeloma in remission: Secondary | ICD-10-CM

## 2017-05-19 DIAGNOSIS — M48061 Spinal stenosis, lumbar region without neurogenic claudication: Secondary | ICD-10-CM

## 2017-05-19 DIAGNOSIS — Z5112 Encounter for antineoplastic immunotherapy: Secondary | ICD-10-CM

## 2017-05-19 DIAGNOSIS — C9002 Multiple myeloma in relapse: Secondary | ICD-10-CM | POA: Diagnosis not present

## 2017-05-19 LAB — COMPREHENSIVE METABOLIC PANEL
ALT: 20 U/L (ref 0–55)
AST: 18 U/L (ref 5–34)
Albumin: 3.8 g/dL (ref 3.5–5.0)
Alkaline Phosphatase: 55 U/L (ref 40–150)
Anion Gap: 14 mEq/L — ABNORMAL HIGH (ref 3–11)
BILIRUBIN TOTAL: 0.61 mg/dL (ref 0.20–1.20)
BUN: 21.7 mg/dL (ref 7.0–26.0)
CHLORIDE: 107 meq/L (ref 98–109)
CO2: 25 meq/L (ref 22–29)
Calcium: 10.2 mg/dL (ref 8.4–10.4)
Creatinine: 0.8 mg/dL (ref 0.6–1.1)
EGFR: 74 mL/min/{1.73_m2} — ABNORMAL LOW (ref >90)
GLUCOSE: 99 mg/dL (ref 70–140)
Potassium: 4.2 mEq/L (ref 3.5–5.1)
SODIUM: 146 meq/L — AB (ref 136–145)
TOTAL PROTEIN: 6.9 g/dL (ref 6.4–8.3)

## 2017-05-19 LAB — CBC WITH DIFFERENTIAL/PLATELET
BASO%: 0.6 % (ref 0.0–2.0)
BASOS ABS: 0 10*3/uL (ref 0.0–0.1)
EOS ABS: 0 10*3/uL (ref 0.0–0.5)
EOS%: 0.3 % (ref 0.0–7.0)
HEMATOCRIT: 39.9 % (ref 34.8–46.6)
HGB: 13.4 g/dL (ref 11.6–15.9)
LYMPH#: 1.2 10*3/uL (ref 0.9–3.3)
LYMPH%: 19.7 % (ref 14.0–49.7)
MCH: 30.8 pg (ref 25.1–34.0)
MCHC: 33.6 g/dL (ref 31.5–36.0)
MCV: 91.9 fL (ref 79.5–101.0)
MONO#: 0.8 10*3/uL (ref 0.1–0.9)
MONO%: 12.3 % (ref 0.0–14.0)
NEUT#: 4.1 10*3/uL (ref 1.5–6.5)
NEUT%: 67.1 % (ref 38.4–76.8)
PLATELETS: 169 10*3/uL (ref 145–400)
RBC: 4.34 10*6/uL (ref 3.70–5.45)
RDW: 17.2 % — ABNORMAL HIGH (ref 11.2–14.5)
WBC: 6.1 10*3/uL (ref 3.9–10.3)

## 2017-05-19 MED ORDER — OXYCODONE-ACETAMINOPHEN 5-325 MG PO TABS: 1.0000 | ORAL_TABLET | Freq: Four times a day (QID) | ORAL | 0 refills | Status: DC | PRN

## 2017-05-19 MED ORDER — PROCHLORPERAZINE MALEATE 10 MG PO TABS
10.0000 mg | ORAL_TABLET | Freq: Once | ORAL | Status: AC
  Administered 2017-05-19: 10 mg via ORAL

## 2017-05-19 MED ORDER — PROCHLORPERAZINE MALEATE 10 MG PO TABS
ORAL_TABLET | ORAL | Status: AC
  Filled 2017-05-19: qty 1

## 2017-05-19 MED ORDER — BORTEZOMIB CHEMO SQ INJECTION 3.5 MG (2.5MG/ML)
0.9750 mg/m2 | Freq: Once | INTRAMUSCULAR | Status: AC
  Administered 2017-05-19: 1.5 mg via SUBCUTANEOUS
  Filled 2017-05-19: qty 1.5

## 2017-05-19 MED FILL — OXYCODONE-ACETAMINOPHEN 5-3: 5-325 | 22 days supply | Qty: 90 | Fill #0

## 2017-05-19 NOTE — Patient Instructions (Signed)
[facility] Discharge Instructions for Patients Receiving Chemotherapy  Today you received the following chemotherapy agents Velcade  To help prevent nausea and vomiting after your treatment, we encourage you to take your nausea medication    If you develop nausea and vomiting that is not controlled by your nausea medication, call the clinic.   BELOW ARE SYMPTOMS THAT SHOULD BE REPORTED IMMEDIATELY:  *FEVER GREATER THAN 100.5 F  *CHILLS WITH OR WITHOUT FEVER  NAUSEA AND VOMITING THAT IS NOT CONTROLLED WITH YOUR NAUSEA MEDICATION  *UNUSUAL SHORTNESS OF BREATH  *UNUSUAL BRUISING OR BLEEDING  TENDERNESS IN MOUTH AND THROAT WITH OR WITHOUT PRESENCE OF ULCERS  *URINARY PROBLEMS  *BOWEL PROBLEMS  UNUSUAL RASH Items with * indicate a potential emergency and should be followed up as soon as possible.  Feel free to call the clinic you have any questions or concerns. The clinic phone number is [phone].  Please show the CHEMO ALERT CARD at check-in to the Emergency Department and triage nurse.   

## 2017-05-21 LAB — MULTIPLE MYELOMA PANEL, SERUM
ALBUMIN SERPL ELPH-MCNC: 3.7 g/dL (ref 2.9–4.4)
ALPHA 1: 0.3 g/dL (ref 0.0–0.4)
ALPHA2 GLOB SERPL ELPH-MCNC: 0.8 g/dL (ref 0.4–1.0)
Albumin/Glob SerPl: 1.4 (ref 0.7–1.7)
B-GLOBULIN SERPL ELPH-MCNC: 1.3 g/dL (ref 0.7–1.3)
GAMMA GLOB SERPL ELPH-MCNC: 0.4 g/dL (ref 0.4–1.8)
GLOBULIN, TOTAL: 2.7 g/dL (ref 2.2–3.9)
IGG (IMMUNOGLOBIN G), SERUM: 453 mg/dL — AB (ref 700–1600)
IgA, Qn, Serum: 269 mg/dL (ref 64–422)
IgM, Qn, Serum: 14 mg/dL — ABNORMAL LOW (ref 26–217)
M PROTEIN SERPL ELPH-MCNC: 0.2 g/dL — AB
TOTAL PROTEIN: 6.4 g/dL (ref 6.0–8.5)

## 2017-05-21 LAB — KAPPA/LAMBDA LIGHT CHAINS
Ig Kappa Free Light Chain: 126.9 mg/L — ABNORMAL HIGH (ref 3.3–19.4)
Ig Lambda Free Light Chain: 5.8 mg/L (ref 5.7–26.3)
Kappa/Lambda FluidC Ratio: 21.88 — ABNORMAL HIGH (ref 0.26–1.65)

## 2017-05-27 MED FILL — LISINOPRIL 40 MG TABLET: 40 | 30 days supply | Qty: 30 | Fill #8

## 2017-06-02 ENCOUNTER — Ambulatory Visit (HOSPITAL_BASED_OUTPATIENT_CLINIC_OR_DEPARTMENT_OTHER): Payer: Medicare HMO | Admitting: Hematology and Oncology

## 2017-06-02 ENCOUNTER — Telehealth: Payer: Self-pay | Admitting: Hematology and Oncology

## 2017-06-02 ENCOUNTER — Other Ambulatory Visit (HOSPITAL_BASED_OUTPATIENT_CLINIC_OR_DEPARTMENT_OTHER): Payer: Medicare HMO

## 2017-06-02 ENCOUNTER — Ambulatory Visit (HOSPITAL_BASED_OUTPATIENT_CLINIC_OR_DEPARTMENT_OTHER): Payer: Medicare HMO

## 2017-06-02 DIAGNOSIS — Z5112 Encounter for antineoplastic immunotherapy: Secondary | ICD-10-CM

## 2017-06-02 DIAGNOSIS — C9002 Multiple myeloma in relapse: Secondary | ICD-10-CM | POA: Diagnosis not present

## 2017-06-02 DIAGNOSIS — C9001 Multiple myeloma in remission: Secondary | ICD-10-CM

## 2017-06-02 DIAGNOSIS — G893 Neoplasm related pain (acute) (chronic): Secondary | ICD-10-CM

## 2017-06-02 DIAGNOSIS — R413 Other amnesia: Secondary | ICD-10-CM

## 2017-06-02 DIAGNOSIS — C9000 Multiple myeloma not having achieved remission: Secondary | ICD-10-CM

## 2017-06-02 LAB — CBC WITH DIFFERENTIAL/PLATELET
BASO%: 0.2 % (ref 0.0–2.0)
Basophils Absolute: 0 10*3/uL (ref 0.0–0.1)
EOS%: 0.2 % (ref 0.0–7.0)
Eosinophils Absolute: 0 10*3/uL (ref 0.0–0.5)
HCT: 39.8 % (ref 34.8–46.6)
HGB: 12.9 g/dL (ref 11.6–15.9)
LYMPH%: 18.9 % (ref 14.0–49.7)
MCH: 30.6 pg (ref 25.1–34.0)
MCHC: 32.4 g/dL (ref 31.5–36.0)
MCV: 94.3 fL (ref 79.5–101.0)
MONO#: 0.2 10*3/uL (ref 0.1–0.9)
MONO%: 2.5 % (ref 0.0–14.0)
NEUT#: 4.8 10*3/uL (ref 1.5–6.5)
NEUT%: 78.2 % — ABNORMAL HIGH (ref 38.4–76.8)
Platelets: 166 10*3/uL (ref 145–400)
RBC: 4.22 10*6/uL (ref 3.70–5.45)
RDW: 17.1 % — ABNORMAL HIGH (ref 11.2–14.5)
WBC: 6.1 10*3/uL (ref 3.9–10.3)
lymph#: 1.2 10*3/uL (ref 0.9–3.3)

## 2017-06-02 LAB — COMPREHENSIVE METABOLIC PANEL
ALT: 18 U/L (ref 0–55)
AST: 22 U/L (ref 5–34)
Albumin: 3.7 g/dL (ref 3.5–5.0)
Alkaline Phosphatase: 47 U/L (ref 40–150)
Anion Gap: 10 mEq/L (ref 3–11)
BUN: 10.8 mg/dL (ref 7.0–26.0)
CO2: 23 mEq/L (ref 22–29)
Calcium: 9.1 mg/dL (ref 8.4–10.4)
Chloride: 109 mEq/L (ref 98–109)
Creatinine: 0.8 mg/dL (ref 0.6–1.1)
EGFR: 76 mL/min/{1.73_m2} — ABNORMAL LOW (ref >90)
Glucose: 126 mg/dl (ref 70–140)
Potassium: 4.5 mEq/L (ref 3.5–5.1)
Sodium: 143 mEq/L (ref 136–145)
Total Bilirubin: 0.75 mg/dL (ref 0.20–1.20)
Total Protein: 6.5 g/dL (ref 6.4–8.3)

## 2017-06-02 MED ORDER — SODIUM CHLORIDE 0.9 % IV SOLN
Freq: Once | INTRAVENOUS | Status: AC
  Administered 2017-06-02: 11:00:00 via INTRAVENOUS

## 2017-06-02 MED ORDER — PROCHLORPERAZINE MALEATE 10 MG PO TABS
ORAL_TABLET | ORAL | Status: AC
  Filled 2017-06-02: qty 1

## 2017-06-02 MED ORDER — ZOLEDRONIC ACID 4 MG/5ML IV CONC
3.5000 mg | Freq: Once | INTRAVENOUS | Status: AC
  Administered 2017-06-02: 3.5 mg via INTRAVENOUS
  Filled 2017-06-02: qty 4.38

## 2017-06-02 MED ORDER — BORTEZOMIB CHEMO SQ INJECTION 3.5 MG (2.5MG/ML)
0.9750 mg/m2 | Freq: Once | INTRAMUSCULAR | Status: AC
  Administered 2017-06-02: 1.5 mg via SUBCUTANEOUS
  Filled 2017-06-02: qty 1.5

## 2017-06-02 MED ORDER — MORPHINE SULFATE ER 15 MG PO TBCR: 15.0000 mg | EXTENDED_RELEASE_TABLET | Freq: Two times a day (BID) | ORAL | 0 refills | Status: DC

## 2017-06-02 MED ORDER — PROCHLORPERAZINE MALEATE 10 MG PO TABS
10.0000 mg | ORAL_TABLET | Freq: Once | ORAL | Status: AC
  Administered 2017-06-02: 10 mg via ORAL

## 2017-06-02 MED ORDER — DEXAMETHASONE 2 MG PO TABS: 2.0000 mg | ORAL_TABLET | Freq: Every day | ORAL | 1 refills | Status: DC

## 2017-06-02 NOTE — Telephone Encounter (Signed)
Gave relative avs report and appointments for July and August.

## 2017-06-02 NOTE — Patient Instructions (Signed)
[facility] Discharge Instructions for Patients Receiving Chemotherapy  Today you received the following chemotherapy agents: Velcade   To help prevent nausea and vomiting after your treatment, we encourage you to take your nausea medication as directed.    If you develop nausea and vomiting that is not controlled by your nausea medication, call the clinic.   BELOW ARE SYMPTOMS THAT SHOULD BE REPORTED IMMEDIATELY:  *FEVER GREATER THAN 100.5 F  *CHILLS WITH OR WITHOUT FEVER  NAUSEA AND VOMITING THAT IS NOT CONTROLLED WITH YOUR NAUSEA MEDICATION  *UNUSUAL SHORTNESS OF BREATH  *UNUSUAL BRUISING OR BLEEDING  TENDERNESS IN MOUTH AND THROAT WITH OR WITHOUT PRESENCE OF ULCERS  *URINARY PROBLEMS  *BOWEL PROBLEMS  UNUSUAL RASH Items with * indicate a potential emergency and should be followed up as soon as possible.  Feel free to call the clinic you have any questions or concerns. The clinic phone number is [phone].  Please show the CHEMO ALERT CARD at check-in to the Emergency Department and triage nurse.    Zoledronic Acid injection (Hypercalcemia, Oncology) What is this medicine? ZOLEDRONIC ACID (ZOE le dron ik AS id) lowers the amount of calcium loss from bone. It is used to treat too much calcium in your blood from cancer. It is also used to prevent complications of cancer that has spread to the bone. This medicine may be used for other purposes; ask your health care provider or pharmacist if you have questions. COMMON BRAND NAME(S): Zometa What should I tell my health care provider before I take this medicine? They need to know if you have any of these conditions: -aspirin-sensitive asthma -cancer, especially if you are receiving medicines used to treat cancer -dental disease or wear dentures -infection -kidney disease -receiving corticosteroids like dexamethasone or prednisone -an unusual or allergic reaction to zoledronic acid, other medicines,  foods, dyes, or preservatives -pregnant or trying to get pregnant -breast-feeding How should I use this medicine? This medicine is for infusion into a vein. It is given by a health care professional in a hospital or clinic setting. Talk to your pediatrician regarding the use of this medicine in children. Special care may be needed. Overdosage: If you think you have taken too much of this medicine contact a poison control center or emergency room at once. NOTE: This medicine is only for you. Do not share this medicine with others. What if I miss a dose? It is important not to miss your dose. Call your doctor or health care professional if you are unable to keep an appointment. What may interact with this medicine? -certain antibiotics given by injection -NSAIDs, medicines for pain and inflammation, like ibuprofen or naproxen -some diuretics like bumetanide, furosemide -teriparatide -thalidomide This list may not describe all possible interactions. Give your health care provider a list of all the medicines, herbs, non-prescription drugs, or dietary supplements you use. Also tell them if you smoke, drink alcohol, or use illegal drugs. Some items may interact with your medicine. What should I watch for while using this medicine? Visit your doctor or health care professional for regular checkups. It may be some time before you see the benefit from this medicine. Do not stop taking your medicine unless your doctor tells you to. Your doctor may order blood tests or other tests to see how you are doing. Women should inform their doctor if they wish to become pregnant or think they might be pregnant. There is a potential for serious side effects to an unborn   child. Talk to your health care professional or pharmacist for more information. You should make sure that you get enough calcium and vitamin D while you are taking this medicine. Discuss the foods you eat and the vitamins you take with your health  care professional. Some people who take this medicine have severe bone, joint, and/or muscle pain. This medicine may also increase your risk for jaw problems or a broken thigh bone. Tell your doctor right away if you have severe pain in your jaw, bones, joints, or muscles. Tell your doctor if you have any pain that does not go away or that gets worse. Tell your dentist and dental surgeon that you are taking this medicine. You should not have major dental surgery while on this medicine. See your dentist to have a dental exam and fix any dental problems before starting this medicine. Take good care of your teeth while on this medicine. Make sure you see your dentist for regular follow-up appointments. What side effects may I notice from receiving this medicine? Side effects that you should report to your doctor or health care professional as soon as possible: -allergic reactions like skin rash, itching or hives, swelling of the face, lips, or tongue -anxiety, confusion, or depression -breathing problems -changes in vision -eye pain -feeling faint or lightheaded, falls -jaw pain, especially after dental work -mouth sores -muscle cramps, stiffness, or weakness -redness, blistering, peeling or loosening of the skin, including inside the mouth -trouble passing urine or change in the amount of urine Side effects that usually do not require medical attention (report to your doctor or health care professional if they continue or are bothersome): -bone, joint, or muscle pain -constipation -diarrhea -fever -hair loss -irritation at site where injected -loss of appetite -nausea, vomiting -stomach upset -trouble sleeping -trouble swallowing -weak or tired This list may not describe all possible side effects. Call your doctor for medical advice about side effects. You may report side effects to FDA at 1-800-FDA-1088. Where should I keep my medicine? This drug is given in a hospital or clinic and  will not be stored at home. NOTE: This sheet is a summary. It may not cover all possible information. If you have questions about this medicine, talk to your doctor, pharmacist, or health care provider.  2018 Elsevier/Gold Standard (2014-04-01 14:19:39)  

## 2017-06-03 ENCOUNTER — Encounter: Payer: Self-pay | Admitting: Hematology and Oncology

## 2017-06-03 NOTE — Assessment & Plan Note (Signed)
She has poor memory and signs of dementia. I do not believe she is capable of making her own medical decision and I defer to her sister to make decision for her. According to his sister, she has good compliance taking all the medications as instructed

## 2017-06-03 NOTE — Assessment & Plan Note (Signed)
Her cancer pain is improving with improved disease control. She is not taking much MS Contin. We will continue current pain management without changes.  

## 2017-06-03 NOTE — Assessment & Plan Note (Signed)
With recent dose adjustment of Revlimid, she has better control of her disease We will continue dexamethasone taper She will continue Velcade every other week She will continue aspirin therapy to prevent DVT She will continue calcium with vitamin D along with Zometa She will continue acyclovir for antimicrobial prophylaxis

## 2017-06-03 NOTE — Progress Notes (Signed)
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 Revlimid was stopped in July and resumed in October      04/01/2016 Adverse Reaction    Treatment was placed on hold temporarily due to neutropenia       INTERVAL HISTORY: Please see below for problem oriented charting. She returns for further follow-up She is doing well She has not lost weight Denies pain No peripheral neuropathy from treatment No recent infection Her sister stated the patient has been compliant taking all medications as directed  REVIEW OF SYSTEMS:   Constitutional: Denies fevers, chills or abnormal weight loss Eyes: Denies blurriness of vision Ears, nose, mouth, throat, and face: Denies mucositis or sore throat Respiratory: Denies cough, dyspnea or wheezes Cardiovascular: Denies palpitation, chest discomfort or lower extremity swelling Gastrointestinal:  Denies nausea, heartburn or change in bowel habits Skin: Denies abnormal skin rashes Lymphatics: Denies new lymphadenopathy or easy bruising Neurological:Denies numbness, tingling or new weaknesses Behavioral/Psych: Mood is stable, no new changes  All other systems were reviewed with the patient and are negative.  I have reviewed the past medical history, past surgical history, social history and family history with the  patient and they are unchanged from previous note.  ALLERGIES:  is allergic to aspirin; pravastatin; and zocor [simvastatin].  MEDICATIONS:  Current Outpatient Prescriptions  Medication Sig Dispense Refill  . acyclovir (ZOVIRAX) 400 MG tablet Take 1 tablet (400 mg total) by mouth daily. 90 tablet 3  . amoxicillin-clavulanate (AUGMENTIN) 875-125 MG tablet Take 1 tablet by mouth 2 (two) times daily. 14 tablet 0  . dexamethasone (DECADRON) 2 MG tablet Take 1 tablet (2 mg total) by mouth daily. 60 tablet 1  . lenalidomide (REVLIMID) 10 MG capsule Take 1 capsule (10 mg total) by mouth daily. 21 days on and 7 days off. 21 capsule 0  . lisinopril (PRINIVIL,ZESTRIL) 40 MG tablet Take 1 tablet (40 mg total) by mouth daily. 30 tablet 11  . morphine (MS CONTIN) 15 MG 12 hr tablet Take 1 tablet (15 mg total) by mouth every 12 (twelve) hours. 60 tablet 0  . oxyCODONE-acetaminophen (ROXICET) 5-325 MG tablet Take 1 tablet by mouth every 6 (six) hours as needed for severe pain. 90 tablet 0  . terbinafine (LAMISIL) 250 MG tablet TAKE 1 TABLET (250 MG TOTAL) BY MOUTH DAILY. 30 tablet 2   No current facility-administered medications for this visit.     PHYSICAL EXAMINATION: ECOG PERFORMANCE STATUS: 0 - Asymptomatic  Vitals:   06/02/17 1010  BP: 139/81  Pulse: 73  Resp: 18  Temp: 97.8 F (36.6 C)   Filed Weights   06/02/17 1010  Weight: 144 lb 1.6 oz (65.4 kg)    GENERAL:alert, no distress and comfortable SKIN: skin color, texture, turgor are normal, no rashes  or significant lesions EYES: normal, Conjunctiva are pink and non-injected, sclera clear OROPHARYNX:no exudate, no erythema and lips, buccal mucosa, and tongue normal  NECK: supple, thyroid normal size, non-tender, without nodularity LYMPH:  no palpable lymphadenopathy in the cervical, axillary or inguinal LUNGS: clear to auscultation and percussion with normal breathing effort HEART: regular rate & rhythm and no murmurs and no lower  extremity edema ABDOMEN:abdomen soft, non-tender and normal bowel sounds Musculoskeletal:no cyanosis of digits and no clubbing  NEURO: alert & oriented x 3 with fluent speech, no focal motor/sensory deficits  LABORATORY DATA:  I have reviewed the data as listed    Component Value Date/Time   NA 143 06/02/2017 0949   K 4.5 06/02/2017 0949   CL 107 10/06/2016 0526   CO2 23 06/02/2017 0949   GLUCOSE 126 06/02/2017 0949   BUN 10.8 06/02/2017 0949   CREATININE 0.8 06/02/2017 0949   CALCIUM 9.1 06/02/2017 0949   PROT 6.5 06/02/2017 0949   ALBUMIN 3.7 06/02/2017 0949   AST 22 06/02/2017 0949   ALT 18 06/02/2017 0949   ALKPHOS 47 06/02/2017 0949   BILITOT 0.75 06/02/2017 0949   GFRNONAA >60 10/06/2016 0526   GFRNONAA 64 01/17/2016 0910   GFRAA >60 10/06/2016 0526   GFRAA 74 01/17/2016 0910    No results found for: SPEP, UPEP  Lab Results  Component Value Date   WBC 6.1 06/02/2017   NEUTROABS 4.8 06/02/2017   HGB 12.9 06/02/2017   HCT 39.8 06/02/2017   MCV 94.3 06/02/2017   PLT 166 06/02/2017      Chemistry      Component Value Date/Time   NA 143 06/02/2017 0949   K 4.5 06/02/2017 0949   CL 107 10/06/2016 0526   CO2 23 06/02/2017 0949   BUN 10.8 06/02/2017 0949   CREATININE 0.8 06/02/2017 0949      Component Value Date/Time   CALCIUM 9.1 06/02/2017 0949   ALKPHOS 47 06/02/2017 0949   AST 22 06/02/2017 0949   ALT 18 06/02/2017 0949   BILITOT 0.75 06/02/2017 0949       ASSESSMENT & PLAN:  Multiple myeloma in relapse ([place]) With recent dose adjustment of Revlimid, she has better control of her disease We will continue dexamethasone taper She will continue Velcade every other week She will continue aspirin therapy to prevent DVT She will continue calcium with vitamin D along with Zometa She will continue acyclovir for antimicrobial prophylaxis  Cancer associated pain Her cancer pain is improving with improved disease control. She is not taking much MS  Contin. We will continue current pain management without changes.   Memory deficits She has poor memory and signs of dementia. I do not believe she is capable of making her own medical decision and I defer to her sister to make decision for her. According to his sister, she has good compliance taking all the medications as instructed   No orders of the defined types were placed in this encounter.  All questions were answered. The patient knows to call the clinic with any problems, questions or concerns. No barriers to learning was detected. I spent 15 minutes counseling the patient face to face. The total time spent in the appointment was 20 minutes and more than 50% was on counseling and review of test results     Heath Lark, MD 06/03/2017 7:04 PM

## 2017-06-12 ENCOUNTER — Other Ambulatory Visit: Payer: Self-pay | Admitting: *Deleted

## 2017-06-12 MED ORDER — REVLIMID 10 MG PO CAPS: ORAL_CAPSULE | ORAL | 0 refills | Status: DC

## 2017-06-15 MED FILL — DEXAMETHASONE 4 MG TABLET: 4 | 60 days supply | Qty: 30 | Fill #0

## 2017-06-15 MED FILL — MORPHINE SULF ER 15 MG TAB: 15 | 30 days supply | Qty: 60 | Fill #0

## 2017-06-16 ENCOUNTER — Telehealth: Payer: Self-pay | Admitting: *Deleted

## 2017-06-16 ENCOUNTER — Other Ambulatory Visit (HOSPITAL_BASED_OUTPATIENT_CLINIC_OR_DEPARTMENT_OTHER): Payer: Medicare HMO

## 2017-06-16 ENCOUNTER — Ambulatory Visit (HOSPITAL_BASED_OUTPATIENT_CLINIC_OR_DEPARTMENT_OTHER): Payer: Medicare HMO

## 2017-06-16 VITALS — BP 138/48 | HR 62 | Temp 97.8°F | Resp 16

## 2017-06-16 DIAGNOSIS — C9001 Multiple myeloma in remission: Secondary | ICD-10-CM | POA: Diagnosis not present

## 2017-06-16 DIAGNOSIS — C9002 Multiple myeloma in relapse: Secondary | ICD-10-CM

## 2017-06-16 DIAGNOSIS — Z5112 Encounter for antineoplastic immunotherapy: Secondary | ICD-10-CM

## 2017-06-16 LAB — CBC WITH DIFFERENTIAL/PLATELET
BASO%: 1.1 % (ref 0.0–2.0)
Basophils Absolute: 0.1 10*3/uL (ref 0.0–0.1)
EOS%: 7.4 % — ABNORMAL HIGH (ref 0.0–7.0)
Eosinophils Absolute: 0.4 10*3/uL (ref 0.0–0.5)
HCT: 38.3 % (ref 34.8–46.6)
HEMOGLOBIN: 12.4 g/dL (ref 11.6–15.9)
LYMPH%: 23.9 % (ref 14.0–49.7)
MCH: 30.5 pg (ref 25.1–34.0)
MCHC: 32.4 g/dL (ref 31.5–36.0)
MCV: 94 fL (ref 79.5–101.0)
MONO#: 0.5 10*3/uL (ref 0.1–0.9)
MONO%: 9.5 % (ref 0.0–14.0)
NEUT%: 58.1 % (ref 38.4–76.8)
NEUTROS ABS: 3 10*3/uL (ref 1.5–6.5)
Platelets: 157 10*3/uL (ref 145–400)
RBC: 4.08 10*6/uL (ref 3.70–5.45)
RDW: 17.5 % — AB (ref 11.2–14.5)
WBC: 5.1 10*3/uL (ref 3.9–10.3)
lymph#: 1.2 10*3/uL (ref 0.9–3.3)

## 2017-06-16 LAB — COMPREHENSIVE METABOLIC PANEL
ALBUMIN: 3.5 g/dL (ref 3.5–5.0)
ALK PHOS: 53 U/L (ref 40–150)
ALT: 16 U/L (ref 0–55)
AST: 16 U/L (ref 5–34)
Anion Gap: 10 mEq/L (ref 3–11)
BUN: 24.4 mg/dL (ref 7.0–26.0)
CALCIUM: 9.2 mg/dL (ref 8.4–10.4)
CHLORIDE: 109 meq/L (ref 98–109)
CO2: 26 mEq/L (ref 22–29)
CREATININE: 0.9 mg/dL (ref 0.6–1.1)
EGFR: 60 mL/min/{1.73_m2} — ABNORMAL LOW (ref >90)
Glucose: 92 mg/dl (ref 70–140)
POTASSIUM: 3.6 meq/L (ref 3.5–5.1)
Sodium: 144 mEq/L (ref 136–145)
Total Bilirubin: 0.6 mg/dL (ref 0.20–1.20)
Total Protein: 6.2 g/dL — ABNORMAL LOW (ref 6.4–8.3)

## 2017-06-16 MED ORDER — REVLIMID 10 MG PO CAPS: ORAL_CAPSULE | ORAL | 0 refills | Status: DC

## 2017-06-16 MED ORDER — PROCHLORPERAZINE MALEATE 10 MG PO TABS
ORAL_TABLET | ORAL | Status: AC
  Filled 2017-06-16: qty 1

## 2017-06-16 MED ORDER — PROCHLORPERAZINE MALEATE 10 MG PO TABS
10.0000 mg | ORAL_TABLET | Freq: Once | ORAL | Status: AC
  Administered 2017-06-16: 10 mg via ORAL

## 2017-06-16 MED ORDER — BORTEZOMIB CHEMO SQ INJECTION 3.5 MG (2.5MG/ML)
0.9750 mg/m2 | Freq: Once | INTRAMUSCULAR | Status: AC
  Administered 2017-06-16: 1.5 mg via SUBCUTANEOUS
  Filled 2017-06-16: qty 1.5

## 2017-06-16 NOTE — Patient Instructions (Signed)
[facility] Discharge Instructions for Patients Receiving Chemotherapy  Today you received the following chemotherapy agents:  Velcade  To help prevent nausea and vomiting after your treatment, we encourage you to take your nausea medication as prescribed.   If you develop nausea and vomiting that is not controlled by your nausea medication, call the clinic.   BELOW ARE SYMPTOMS THAT SHOULD BE REPORTED IMMEDIATELY:  *FEVER GREATER THAN 100.5 F  *CHILLS WITH OR WITHOUT FEVER  NAUSEA AND VOMITING THAT IS NOT CONTROLLED WITH YOUR NAUSEA MEDICATION  *UNUSUAL SHORTNESS OF BREATH  *UNUSUAL BRUISING OR BLEEDING  TENDERNESS IN MOUTH AND THROAT WITH OR WITHOUT PRESENCE OF ULCERS  *URINARY PROBLEMS  *BOWEL PROBLEMS  UNUSUAL RASH Items with * indicate a potential emergency and should be followed up as soon as possible.  Feel free to call the clinic you have any questions or concerns. The clinic phone number is [phone].  Please show the CHEMO ALERT CARD at check-in to the Emergency Department and triage nurse.   

## 2017-06-16 NOTE — Telephone Encounter (Signed)
"  Robin with [facility] in [place] with a message for Dr. Alvy Bimler nurse.  We received Revlimid order for th os patient but [facility] is the Pharmacy that should be used for this order."   This nurse sent eRx refill order to [facility].

## 2017-06-17 LAB — KAPPA/LAMBDA LIGHT CHAINS
Ig Kappa Free Light Chain: 171.5 mg/L — ABNORMAL HIGH (ref 3.3–19.4)
Ig Lambda Free Light Chain: 7.6 mg/L (ref 5.7–26.3)
KAPPA/LAMBDA FLC RATIO: 22.57 — AB (ref 0.26–1.65)

## 2017-06-18 LAB — MULTIPLE MYELOMA PANEL, SERUM
ALBUMIN SERPL ELPH-MCNC: 3.3 g/dL (ref 2.9–4.4)
ALBUMIN/GLOB SERPL: 1.2 (ref 0.7–1.7)
ALPHA 1: 0.3 g/dL (ref 0.0–0.4)
ALPHA2 GLOB SERPL ELPH-MCNC: 0.9 g/dL (ref 0.4–1.0)
B-Globulin SerPl Elph-Mcnc: 1.2 g/dL (ref 0.7–1.3)
Gamma Glob SerPl Elph-Mcnc: 0.4 g/dL (ref 0.4–1.8)
Globulin, Total: 2.8 g/dL (ref 2.2–3.9)
IGM (IMMUNOGLOBIN M), SRM: 10 mg/dL — AB (ref 26–217)
IgA, Qn, Serum: 215 mg/dL (ref 64–422)
IgG, Qn, Serum: 399 mg/dL — ABNORMAL LOW (ref 700–1600)
M Protein SerPl Elph-Mcnc: 0.3 g/dL — ABNORMAL HIGH
TOTAL PROTEIN: 6.1 g/dL (ref 6.0–8.5)

## 2017-06-30 ENCOUNTER — Telehealth: Payer: Self-pay | Admitting: Hematology and Oncology

## 2017-06-30 ENCOUNTER — Ambulatory Visit (HOSPITAL_BASED_OUTPATIENT_CLINIC_OR_DEPARTMENT_OTHER): Payer: Medicare HMO

## 2017-06-30 ENCOUNTER — Other Ambulatory Visit (HOSPITAL_BASED_OUTPATIENT_CLINIC_OR_DEPARTMENT_OTHER): Payer: Medicare HMO

## 2017-06-30 ENCOUNTER — Ambulatory Visit (HOSPITAL_BASED_OUTPATIENT_CLINIC_OR_DEPARTMENT_OTHER): Payer: Medicare HMO | Admitting: Hematology and Oncology

## 2017-06-30 DIAGNOSIS — E441 Mild protein-calorie malnutrition: Secondary | ICD-10-CM

## 2017-06-30 DIAGNOSIS — C9002 Multiple myeloma in relapse: Secondary | ICD-10-CM

## 2017-06-30 DIAGNOSIS — Z5112 Encounter for antineoplastic immunotherapy: Secondary | ICD-10-CM | POA: Diagnosis not present

## 2017-06-30 DIAGNOSIS — R634 Abnormal weight loss: Secondary | ICD-10-CM

## 2017-06-30 DIAGNOSIS — G893 Neoplasm related pain (acute) (chronic): Secondary | ICD-10-CM

## 2017-06-30 DIAGNOSIS — C9001 Multiple myeloma in remission: Secondary | ICD-10-CM

## 2017-06-30 DIAGNOSIS — I10 Essential (primary) hypertension: Secondary | ICD-10-CM | POA: Diagnosis not present

## 2017-06-30 LAB — CBC WITH DIFFERENTIAL/PLATELET
BASO%: 0.2 % (ref 0.0–2.0)
Basophils Absolute: 0 10*3/uL (ref 0.0–0.1)
EOS%: 0.5 % (ref 0.0–7.0)
Eosinophils Absolute: 0 10*3/uL (ref 0.0–0.5)
HEMATOCRIT: 36.8 % (ref 34.8–46.6)
HGB: 12 g/dL (ref 11.6–15.9)
LYMPH%: 16.6 % (ref 14.0–49.7)
MCH: 30.6 pg (ref 25.1–34.0)
MCHC: 32.6 g/dL (ref 31.5–36.0)
MCV: 93.9 fL (ref 79.5–101.0)
MONO#: 0.7 10*3/uL (ref 0.1–0.9)
MONO%: 8.8 % (ref 0.0–14.0)
NEUT%: 73.9 % (ref 38.4–76.8)
NEUTROS ABS: 6.1 10*3/uL (ref 1.5–6.5)
Platelets: 188 10*3/uL (ref 145–400)
RBC: 3.92 10*6/uL (ref 3.70–5.45)
RDW: 17.1 % — ABNORMAL HIGH (ref 11.2–14.5)
WBC: 8.3 10*3/uL (ref 3.9–10.3)
lymph#: 1.4 10*3/uL (ref 0.9–3.3)
nRBC: 0 % (ref 0–0)

## 2017-06-30 LAB — COMPREHENSIVE METABOLIC PANEL
ALBUMIN: 3.4 g/dL — AB (ref 3.5–5.0)
ALK PHOS: 53 U/L (ref 40–150)
ALT: 21 U/L (ref 0–55)
AST: 16 U/L (ref 5–34)
Anion Gap: 10 mEq/L (ref 3–11)
BILIRUBIN TOTAL: 0.63 mg/dL (ref 0.20–1.20)
BUN: 16 mg/dL (ref 7.0–26.0)
CALCIUM: 9.2 mg/dL (ref 8.4–10.4)
CO2: 24 mEq/L (ref 22–29)
CREATININE: 0.7 mg/dL (ref 0.6–1.1)
Chloride: 107 mEq/L (ref 98–109)
EGFR: 81 mL/min/{1.73_m2} — ABNORMAL LOW (ref >90)
GLUCOSE: 100 mg/dL (ref 70–140)
POTASSIUM: 3.6 meq/L (ref 3.5–5.1)
SODIUM: 142 meq/L (ref 136–145)
TOTAL PROTEIN: 6 g/dL — AB (ref 6.4–8.3)

## 2017-06-30 LAB — TECHNOLOGIST REVIEW

## 2017-06-30 MED ORDER — PROCHLORPERAZINE MALEATE 10 MG PO TABS
10.0000 mg | ORAL_TABLET | Freq: Once | ORAL | Status: AC
  Administered 2017-06-30: 10 mg via ORAL

## 2017-06-30 MED ORDER — PROCHLORPERAZINE MALEATE 10 MG PO TABS
ORAL_TABLET | ORAL | Status: AC
  Filled 2017-06-30: qty 1

## 2017-06-30 MED ORDER — BORTEZOMIB CHEMO SQ INJECTION 3.5 MG (2.5MG/ML)
0.9750 mg/m2 | Freq: Once | INTRAMUSCULAR | Status: AC
  Administered 2017-06-30: 1.5 mg via SUBCUTANEOUS
  Filled 2017-06-30: qty 1.5

## 2017-06-30 MED FILL — LISINOPRIL 40 MG TABLET: 40 | 30 days supply | Qty: 30 | Fill #9

## 2017-06-30 NOTE — Assessment & Plan Note (Signed)
With recent dose adjustment of Revlimid, she has better control of her disease Since I commenced dexamethasone taper to 2 mg daily, she have lost some weight.  I recommend we continue the same dose for now She will continue Velcade every other week She will continue aspirin therapy to prevent DVT She will continue calcium with vitamin D along with Zometa She will continue acyclovir for antimicrobial prophylaxis

## 2017-06-30 NOTE — Assessment & Plan Note (Signed)
The patient has poor appetite Low-dose dexamethasone appears to help her appetite She have lost some weight since we initiated dexamethasone taper For now, I will continue low-dose dexamethasone indefinitely

## 2017-06-30 NOTE — Patient Instructions (Signed)
[facility] Discharge Instructions for Patients Receiving Chemotherapy  Today you received the following chemotherapy agents:  Velcade  To help prevent nausea and vomiting after your treatment, we encourage you to take your nausea medication as prescribed.   If you develop nausea and vomiting that is not controlled by your nausea medication, call the clinic.   BELOW ARE SYMPTOMS THAT SHOULD BE REPORTED IMMEDIATELY:  *FEVER GREATER THAN 100.5 F  *CHILLS WITH OR WITHOUT FEVER  NAUSEA AND VOMITING THAT IS NOT CONTROLLED WITH YOUR NAUSEA MEDICATION  *UNUSUAL SHORTNESS OF BREATH  *UNUSUAL BRUISING OR BLEEDING  TENDERNESS IN MOUTH AND THROAT WITH OR WITHOUT PRESENCE OF ULCERS  *URINARY PROBLEMS  *BOWEL PROBLEMS  UNUSUAL RASH Items with * indicate a potential emergency and should be followed up as soon as possible.  Feel free to call the clinic you have any questions or concerns. The clinic phone number is [phone].  Please show the CHEMO ALERT CARD at check-in to the Emergency Department and triage nurse.   

## 2017-06-30 NOTE — Progress Notes (Signed)
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 Revlimid was stopped in July and resumed in October      04/01/2016 Adverse Reaction    Treatment was placed on hold temporarily due to neutropenia       INTERVAL HISTORY: Please see below for problem oriented charting. She returns for further follow-up There is no reported infection No new bone pain She denies peripheral neuropathy Her sister reported compliance with medication She have lost some weight since we initiated dexamethasone taper  REVIEW OF SYSTEMS:   Constitutional: Denies fevers, chills or abnormal weight loss Eyes: Denies blurriness of vision Ears, nose, mouth, throat, and face: Denies mucositis or sore throat Respiratory: Denies cough, dyspnea or wheezes Cardiovascular: Denies palpitation, chest discomfort or lower extremity swelling Gastrointestinal:  Denies nausea, heartburn or change in bowel habits Skin: Denies abnormal skin rashes Lymphatics: Denies new lymphadenopathy or easy bruising Neurological:Denies numbness, tingling or new weaknesses Behavioral/Psych: Mood is stable, no new changes  All other systems were reviewed with the patient and are negative.  I have reviewed the past medical history, past surgical history, social history and family history with the  patient and they are unchanged from previous note.  ALLERGIES:  is allergic to aspirin; pravastatin; and zocor [simvastatin].  MEDICATIONS:  Current Outpatient Prescriptions  Medication Sig Dispense Refill  . acyclovir (ZOVIRAX) 400 MG tablet Take 1 tablet (400 mg total) by mouth daily. 90 tablet 3  . dexamethasone (DECADRON) 2 MG tablet Take 1 tablet (2 mg total) by mouth daily. 60 tablet 1  . lisinopril (PRINIVIL,ZESTRIL) 40 MG tablet Take 1 tablet (40 mg total) by mouth daily. 30 tablet 11  . morphine (MS CONTIN) 15 MG 12 hr tablet Take 1 tablet (15 mg total) by mouth every 12 (twelve) hours. 60 tablet 0  . oxyCODONE-acetaminophen (ROXICET) 5-325 MG tablet Take 1 tablet by mouth every 6 (six) hours as needed for severe pain. 90 tablet 0  . REVLIMID 10 MG capsule Take 1 capsule by mouth every day for 21 days on, then 7 days off. 21 capsule 0  . terbinafine (LAMISIL) 250 MG tablet TAKE 1 TABLET (250 MG TOTAL) BY MOUTH DAILY. 30 tablet 2   No current facility-administered medications for this visit.     PHYSICAL EXAMINATION: ECOG PERFORMANCE STATUS: 1 - Symptomatic but completely ambulatory  Vitals:   06/30/17 1027  BP: (!) 143/68  Pulse: 62  Resp: 17  Temp: 97.7 F (36.5 C)  SpO2: 98%   Filed Weights   06/30/17 1027  Weight: 140 lb 8 oz (63.7 kg)    GENERAL:alert, no distress and comfortable SKIN: skin color, texture, turgor are normal, no rashes or significant lesions EYES: normal, Conjunctiva are pink and non-injected, sclera clear OROPHARYNX:no exudate, no erythema and  lips, buccal mucosa, and tongue normal  NECK: supple, thyroid normal size, non-tender, without nodularity LYMPH:  no palpable lymphadenopathy in the cervical, axillary or inguinal LUNGS: clear to auscultation and percussion with normal breathing effort HEART: regular rate & rhythm and no murmurs and no lower extremity edema ABDOMEN:abdomen soft, non-tender and normal bowel sounds Musculoskeletal:no  cyanosis of digits and no clubbing  NEURO: alert & oriented x 3 with fluent speech, no focal motor/sensory deficits  LABORATORY DATA:  I have reviewed the data as listed    Component Value Date/Time   NA 142 06/30/2017 1015   K 3.6 06/30/2017 1015   CL 107 10/06/2016 0526   CO2 24 06/30/2017 1015   GLUCOSE 100 06/30/2017 1015   BUN 16.0 06/30/2017 1015   CREATININE 0.7 06/30/2017 1015   CALCIUM 9.2 06/30/2017 1015   PROT 6.0 (L) 06/30/2017 1015   ALBUMIN 3.4 (L) 06/30/2017 1015   AST 16 06/30/2017 1015   ALT 21 06/30/2017 1015   ALKPHOS 53 06/30/2017 1015   BILITOT 0.63 06/30/2017 1015   GFRNONAA >60 10/06/2016 0526   GFRNONAA 64 01/17/2016 0910   GFRAA >60 10/06/2016 0526   GFRAA 74 01/17/2016 0910    No results found for: SPEP, UPEP  Lab Results  Component Value Date   WBC 8.3 06/30/2017   NEUTROABS 6.1 06/30/2017   HGB 12.0 06/30/2017   HCT 36.8 06/30/2017   MCV 93.9 06/30/2017   PLT 188 06/30/2017      Chemistry      Component Value Date/Time   NA 142 06/30/2017 1015   K 3.6 06/30/2017 1015   CL 107 10/06/2016 0526   CO2 24 06/30/2017 1015   BUN 16.0 06/30/2017 1015   CREATININE 0.7 06/30/2017 1015      Component Value Date/Time   CALCIUM 9.2 06/30/2017 1015   ALKPHOS 53 06/30/2017 1015   AST 16 06/30/2017 1015   ALT 21 06/30/2017 1015   BILITOT 0.63 06/30/2017 1015      ASSESSMENT & PLAN:  Multiple myeloma in relapse ([place]) With recent dose adjustment of Revlimid, she has better control of her disease Since I commenced dexamethasone taper to 2 mg daily, she have lost some weight.  I recommend we continue the same dose for now She will continue Velcade every other week She will continue aspirin therapy to prevent DVT She will continue calcium with vitamin D along with Zometa She will continue acyclovir for antimicrobial prophylaxis  Cancer associated pain Her cancer pain is improving with improved disease control. She is not taking much MS  Contin. We will continue current pain management without changes.   Essential hypertension she will continue current medical management. I recommend close follow-up with primary care doctor for medication adjustment.   Protein-calorie malnutrition, mild ([place]) The patient has poor appetite Low-dose dexamethasone appears to help her appetite She have lost some weight since we initiated dexamethasone taper For now, I will continue low-dose dexamethasone indefinitely   No orders of the defined types were placed in this encounter.  All questions were answered. The patient knows to call the clinic with any problems, questions or concerns. No barriers to learning was detected. I spent 15 minutes counseling the patient face to face. The total time spent in the appointment was 20 minutes and more than 50% was on counseling and review of test results     Heath Lark, MD 06/30/2017 1:19 PM

## 2017-06-30 NOTE — Assessment & Plan Note (Signed)
Her cancer pain is improving with improved disease control. She is not taking much MS Contin. We will continue current pain management without changes.  

## 2017-06-30 NOTE — Telephone Encounter (Signed)
Gave patient avs's and calendar for upcoming appts.

## 2017-06-30 NOTE — Assessment & Plan Note (Signed)
she will continue current medical management. I recommend close follow-up with primary care doctor for medication adjustment.  

## 2017-07-09 ENCOUNTER — Other Ambulatory Visit: Payer: Self-pay | Admitting: *Deleted

## 2017-07-09 MED ORDER — REVLIMID 10 MG PO CAPS: ORAL_CAPSULE | ORAL | 0 refills | Status: DC

## 2017-07-14 ENCOUNTER — Other Ambulatory Visit (HOSPITAL_BASED_OUTPATIENT_CLINIC_OR_DEPARTMENT_OTHER): Payer: Medicare HMO

## 2017-07-14 ENCOUNTER — Ambulatory Visit (HOSPITAL_BASED_OUTPATIENT_CLINIC_OR_DEPARTMENT_OTHER): Payer: Medicare HMO

## 2017-07-14 VITALS — BP 141/72 | HR 62 | Temp 98.8°F | Resp 16

## 2017-07-14 DIAGNOSIS — C9001 Multiple myeloma in remission: Secondary | ICD-10-CM

## 2017-07-14 DIAGNOSIS — Z5112 Encounter for antineoplastic immunotherapy: Secondary | ICD-10-CM | POA: Diagnosis not present

## 2017-07-14 DIAGNOSIS — C9002 Multiple myeloma in relapse: Secondary | ICD-10-CM

## 2017-07-14 LAB — CBC WITH DIFFERENTIAL/PLATELET
BASO%: 1.4 % (ref 0.0–2.0)
Basophils Absolute: 0.1 10*3/uL (ref 0.0–0.1)
EOS%: 10 % — ABNORMAL HIGH (ref 0.0–7.0)
Eosinophils Absolute: 0.4 10*3/uL (ref 0.0–0.5)
HEMATOCRIT: 36.1 % (ref 34.8–46.6)
HGB: 12.2 g/dL (ref 11.6–15.9)
LYMPH#: 1.4 10*3/uL (ref 0.9–3.3)
LYMPH%: 35.5 % (ref 14.0–49.7)
MCH: 31.6 pg (ref 25.1–34.0)
MCHC: 33.9 g/dL (ref 31.5–36.0)
MCV: 93.3 fL (ref 79.5–101.0)
MONO#: 0.3 10*3/uL (ref 0.1–0.9)
MONO%: 8.9 % (ref 0.0–14.0)
NEUT%: 44.2 % (ref 38.4–76.8)
NEUTROS ABS: 1.7 10*3/uL (ref 1.5–6.5)
PLATELETS: 142 10*3/uL — AB (ref 145–400)
RBC: 3.87 10*6/uL (ref 3.70–5.45)
RDW: 18 % — ABNORMAL HIGH (ref 11.2–14.5)
WBC: 3.9 10*3/uL (ref 3.9–10.3)

## 2017-07-14 LAB — COMPREHENSIVE METABOLIC PANEL
ALBUMIN: 3.3 g/dL — AB (ref 3.5–5.0)
ALK PHOS: 48 U/L (ref 40–150)
ALT: 14 U/L (ref 0–55)
ANION GAP: 8 meq/L (ref 3–11)
AST: 17 U/L (ref 5–34)
BUN: 8.6 mg/dL (ref 7.0–26.0)
CO2: 26 mEq/L (ref 22–29)
Calcium: 8.8 mg/dL (ref 8.4–10.4)
Chloride: 108 mEq/L (ref 98–109)
Creatinine: 0.7 mg/dL (ref 0.6–1.1)
EGFR: 80 mL/min/{1.73_m2} — AB (ref >90)
Glucose: 90 mg/dl (ref 70–140)
POTASSIUM: 3.3 meq/L — AB (ref 3.5–5.1)
Sodium: 143 mEq/L (ref 136–145)
Total Bilirubin: 0.64 mg/dL (ref 0.20–1.20)
Total Protein: 6.1 g/dL — ABNORMAL LOW (ref 6.4–8.3)

## 2017-07-14 MED ORDER — BORTEZOMIB CHEMO SQ INJECTION 3.5 MG (2.5MG/ML)
0.9750 mg/m2 | Freq: Once | INTRAMUSCULAR | Status: AC
  Administered 2017-07-14: 1.5 mg via SUBCUTANEOUS
  Filled 2017-07-14: qty 1.5

## 2017-07-14 MED ORDER — PROCHLORPERAZINE MALEATE 10 MG PO TABS
ORAL_TABLET | ORAL | Status: AC
Start: 2017-07-14 — End: 2017-07-14
  Filled 2017-07-14: qty 1

## 2017-07-14 MED ORDER — PROCHLORPERAZINE MALEATE 10 MG PO TABS
10.0000 mg | ORAL_TABLET | Freq: Once | ORAL | Status: AC
  Administered 2017-07-14: 10 mg via ORAL

## 2017-07-14 NOTE — Progress Notes (Signed)
Patient with right middle finger skin peeling and thickening of nail.  Afebrile and labs with in parameters.  Instructed patient family that primary care physician should evaluate for possible fungal infection.  Patient reports no pain.  Labs results from today printed and given to patient.

## 2017-07-15 ENCOUNTER — Ambulatory Visit (INDEPENDENT_AMBULATORY_CARE_PROVIDER_SITE_OTHER): Payer: Medicare HMO | Admitting: Family Medicine

## 2017-07-15 ENCOUNTER — Encounter: Payer: Self-pay | Admitting: Family Medicine

## 2017-07-15 VITALS — BP 140/82 | HR 62 | Temp 98.4°F | Resp 16 | Ht 63.0 in | Wt 138.0 lb

## 2017-07-15 DIAGNOSIS — L603 Nail dystrophy: Secondary | ICD-10-CM | POA: Diagnosis not present

## 2017-07-15 DIAGNOSIS — L03011 Cellulitis of right finger: Secondary | ICD-10-CM | POA: Diagnosis not present

## 2017-07-15 LAB — KAPPA/LAMBDA LIGHT CHAINS
Ig Kappa Free Light Chain: 83.7 mg/L — ABNORMAL HIGH (ref 3.3–19.4)
Ig Lambda Free Light Chain: 10.9 mg/L (ref 5.7–26.3)
Kappa/Lambda FluidC Ratio: 7.68 — ABNORMAL HIGH (ref 0.26–1.65)

## 2017-07-15 MED ORDER — CEPHALEXIN 500 MG PO CAPS: 500.0000 mg | ORAL_CAPSULE | Freq: Two times a day (BID) | ORAL | 0 refills | Status: DC

## 2017-07-15 NOTE — Patient Instructions (Signed)
Take antibiotics Use dial soap and warm soaks  F/U as needed

## 2017-07-15 NOTE — Progress Notes (Signed)
   Subjective:    Patient ID: Kimberly Friedman, female    DOB: 1936/07/28, 81 y.o.   MRN: [id number]  Patient presents for Nail Infection (R middle finger discoloration and skin peeling- denies pain/ itching)   Pt here with Her sister. Yesterday when she was getting her treatment for her multiple myeloma they noticed that her finger was peeling and red. She denies any particular injury states that she was working on herself by using Vaseline and peroxide. She does not remember any drainage from the nail. She has had onychomycoses in the past on her toenails. She has not had any fever no change in joint pain.   Review Of Systems:  GEN- denies fatigue, fever, weight loss,weakness, recent illness HEENT- denies eye drainage, change in vision, nasal discharge, CVS- denies chest pain, palpitations RESP- denies SOB, cough, wheeze ABD- denies N/V, change in stools, abd pain GU- denies dysuria, hematuria, dribbling, incontinence MSK- denies joint pain, muscle aches, injury Neuro- denies headache, dizziness, syncope, seizure activity       Objective:    BP 140/82   Pulse 62   Temp 98.4 F (36.9 C) (Oral)   Resp 16   Ht _0  (1.6 m)   Wt 138 lb (62.6 kg)   SpO2 96%   BMI 24.45 kg/m  GEN- NAD, alert and oriented x3 CVS- RRR, no murmur RESP-CTAB Skin- Right hand middle finger- erythema and peeling/swelling around nail bed at inferior border, yellow crusting and callus on lateral border, mild TTP, no gross pus or fluctuance, FROM Finger joint  EXT- No edema, very thick great toenails, right pulling up from nail bed mild TTP over nail Pulses- Radial, DP- 2+        Assessment & Plan:      Problem List Items Addressed This Visit    None    Visit Diagnoses    Paronychia of right middle finger    -  Primary   Treat with warm water soaks, antibacterial soap, keflex by mouth, on chemo for her MM. No area to I &D today   Dystrophic nail       Thickened toenails, need debridement and  trimmed back, send to podiatry for this    Relevant Orders   Ambulatory referral to Podiatry      Note: This dictation was prepared with Dragon dictation along with smaller phrase technology. Any transcriptional errors that result from this process are unintentional.

## 2017-07-16 LAB — MULTIPLE MYELOMA PANEL, SERUM
ALBUMIN/GLOB SERPL: 1.3 (ref 0.7–1.7)
ALPHA2 GLOB SERPL ELPH-MCNC: 0.7 g/dL (ref 0.4–1.0)
Albumin SerPl Elph-Mcnc: 3.2 g/dL (ref 2.9–4.4)
Alpha 1: 0.3 g/dL (ref 0.0–0.4)
B-Globulin SerPl Elph-Mcnc: 1 g/dL (ref 0.7–1.3)
Gamma Glob SerPl Elph-Mcnc: 0.5 g/dL (ref 0.4–1.8)
Globulin, Total: 2.5 g/dL (ref 2.2–3.9)
IGA/IMMUNOGLOBULIN A, SERUM: 157 mg/dL (ref 64–422)
IGM (IMMUNOGLOBIN M), SRM: 10 mg/dL — AB (ref 26–217)
IgG, Qn, Serum: 553 mg/dL — ABNORMAL LOW (ref 700–1600)
TOTAL PROTEIN: 5.7 g/dL — AB (ref 6.0–8.5)

## 2017-07-21 ENCOUNTER — Other Ambulatory Visit: Payer: Self-pay | Admitting: Oncology

## 2017-07-21 ENCOUNTER — Other Ambulatory Visit: Payer: Self-pay | Admitting: Hematology and Oncology

## 2017-07-21 DIAGNOSIS — C9000 Multiple myeloma not having achieved remission: Secondary | ICD-10-CM

## 2017-07-21 DIAGNOSIS — M48061 Spinal stenosis, lumbar region without neurogenic claudication: Secondary | ICD-10-CM

## 2017-07-22 ENCOUNTER — Other Ambulatory Visit: Payer: Self-pay | Admitting: *Deleted

## 2017-07-22 DIAGNOSIS — M48061 Spinal stenosis, lumbar region without neurogenic claudication: Secondary | ICD-10-CM

## 2017-07-22 MED ORDER — OXYCODONE-ACETAMINOPHEN 5-325 MG PO TABS: 1.0000 | ORAL_TABLET | Freq: Four times a day (QID) | ORAL | 0 refills | Status: DC | PRN

## 2017-07-22 MED FILL — DEXAMETHASONE 2 MG TABLET: 2 | 30 days supply | Qty: 30 | Fill #0

## 2017-07-22 MED FILL — ACYCLOVIR 400 MG TABLET: 400 | 90 days supply | Qty: 90 | Fill #0

## 2017-07-22 NOTE — Telephone Encounter (Signed)
Mrs Kimberly Friedman states patient needs refill of oxycodone. [facility] to clarify decadron 2 mg, rather than 4 mg  (1/2 tablet). They will order 2 mg tablets.

## 2017-07-23 MED FILL — OXYCODONE-ACETAMINOPHEN 5-3: 5-325 | 22 days supply | Qty: 90 | Fill #0

## 2017-07-28 ENCOUNTER — Encounter: Payer: Self-pay | Admitting: Hematology and Oncology

## 2017-07-28 ENCOUNTER — Other Ambulatory Visit (HOSPITAL_BASED_OUTPATIENT_CLINIC_OR_DEPARTMENT_OTHER): Payer: Medicare HMO

## 2017-07-28 ENCOUNTER — Ambulatory Visit (HOSPITAL_BASED_OUTPATIENT_CLINIC_OR_DEPARTMENT_OTHER): Payer: Medicare HMO | Admitting: Hematology and Oncology

## 2017-07-28 ENCOUNTER — Telehealth: Payer: Self-pay

## 2017-07-28 ENCOUNTER — Ambulatory Visit (HOSPITAL_BASED_OUTPATIENT_CLINIC_OR_DEPARTMENT_OTHER): Payer: Medicare HMO

## 2017-07-28 DIAGNOSIS — I10 Essential (primary) hypertension: Secondary | ICD-10-CM | POA: Diagnosis not present

## 2017-07-28 DIAGNOSIS — C9002 Multiple myeloma in relapse: Secondary | ICD-10-CM | POA: Diagnosis not present

## 2017-07-28 DIAGNOSIS — C9001 Multiple myeloma in remission: Secondary | ICD-10-CM

## 2017-07-28 DIAGNOSIS — R413 Other amnesia: Secondary | ICD-10-CM

## 2017-07-28 DIAGNOSIS — Z5112 Encounter for antineoplastic immunotherapy: Secondary | ICD-10-CM

## 2017-07-28 DIAGNOSIS — G893 Neoplasm related pain (acute) (chronic): Secondary | ICD-10-CM | POA: Diagnosis not present

## 2017-07-28 LAB — CBC WITH DIFFERENTIAL/PLATELET
BASO%: 0.1 % (ref 0.0–2.0)
BASOS ABS: 0 10*3/uL (ref 0.0–0.1)
EOS ABS: 0 10*3/uL (ref 0.0–0.5)
EOS%: 0.4 % (ref 0.0–7.0)
HCT: 37.9 % (ref 34.8–46.6)
HEMOGLOBIN: 12.5 g/dL (ref 11.6–15.9)
LYMPH%: 25 % (ref 14.0–49.7)
MCH: 31.3 pg (ref 25.1–34.0)
MCHC: 33 g/dL (ref 31.5–36.0)
MCV: 95 fL (ref 79.5–101.0)
MONO#: 0.9 10*3/uL (ref 0.1–0.9)
MONO%: 12.9 % (ref 0.0–14.0)
NEUT#: 4.3 10*3/uL (ref 1.5–6.5)
NEUT%: 61.6 % (ref 38.4–76.8)
Platelets: 181 10*3/uL (ref 145–400)
RBC: 3.99 10*6/uL (ref 3.70–5.45)
RDW: 17.1 % — ABNORMAL HIGH (ref 11.2–14.5)
WBC: 6.9 10*3/uL (ref 3.9–10.3)
lymph#: 1.7 10*3/uL (ref 0.9–3.3)

## 2017-07-28 LAB — COMPREHENSIVE METABOLIC PANEL
ALT: 24 U/L (ref 0–55)
ANION GAP: 8 meq/L (ref 3–11)
AST: 16 U/L (ref 5–34)
Albumin: 3.5 g/dL (ref 3.5–5.0)
Alkaline Phosphatase: 49 U/L (ref 40–150)
BILIRUBIN TOTAL: 0.54 mg/dL (ref 0.20–1.20)
BUN: 20.5 mg/dL (ref 7.0–26.0)
CO2: 25 meq/L (ref 22–29)
CREATININE: 0.7 mg/dL (ref 0.6–1.1)
Calcium: 9.3 mg/dL (ref 8.4–10.4)
Chloride: 109 mEq/L (ref 98–109)
EGFR: 78 mL/min/{1.73_m2} — ABNORMAL LOW (ref >90)
GLUCOSE: 88 mg/dL (ref 70–140)
Potassium: 4 mEq/L (ref 3.5–5.1)
SODIUM: 143 meq/L (ref 136–145)
TOTAL PROTEIN: 6.2 g/dL — AB (ref 6.4–8.3)

## 2017-07-28 MED ORDER — PROCHLORPERAZINE MALEATE 10 MG PO TABS
10.0000 mg | ORAL_TABLET | Freq: Once | ORAL | Status: AC
  Administered 2017-07-28: 10 mg via ORAL

## 2017-07-28 MED ORDER — PROCHLORPERAZINE MALEATE 10 MG PO TABS
ORAL_TABLET | ORAL | Status: AC
  Filled 2017-07-28: qty 1

## 2017-07-28 MED ORDER — BORTEZOMIB CHEMO SQ INJECTION 3.5 MG (2.5MG/ML)
0.9750 mg/m2 | Freq: Once | INTRAMUSCULAR | Status: AC
  Administered 2017-07-28: 1.5 mg via SUBCUTANEOUS
  Filled 2017-07-28: qty 1.5

## 2017-07-28 NOTE — Assessment & Plan Note (Signed)
With recent dose adjustment of Revlimid, she has better control of her disease Some myeloma panel showed near complete response to therapy Since I commenced dexamethasone taper to 2 mg daily, she have lost some weight.  I recommend we continue the same dose for now She will continue Velcade every other week She will continue aspirin therapy to prevent DVT She will continue calcium with vitamin D along with Zometa She will continue acyclovir for antimicrobial prophylaxis

## 2017-07-28 NOTE — Patient Instructions (Signed)
[facility] Discharge Instructions for Patients Receiving Chemotherapy  Today you received the following chemotherapy agents Velcade. To help prevent nausea and vomiting after your treatment, we encourage you to take your nausea medication as directed.  If you develop nausea and vomiting that is not controlled by your nausea medication, call the clinic.   BELOW ARE SYMPTOMS THAT SHOULD BE REPORTED IMMEDIATELY:  *FEVER GREATER THAN 100.5 F  *CHILLS WITH OR WITHOUT FEVER  NAUSEA AND VOMITING THAT IS NOT CONTROLLED WITH YOUR NAUSEA MEDICATION  *UNUSUAL SHORTNESS OF BREATH  *UNUSUAL BRUISING OR BLEEDING  TENDERNESS IN MOUTH AND THROAT WITH OR WITHOUT PRESENCE OF ULCERS  *URINARY PROBLEMS  *BOWEL PROBLEMS  UNUSUAL RASH Items with * indicate a potential emergency and should be followed up as soon as possible.  Feel free to call the clinic you have any questions or concerns. The clinic phone number is [phone].  Please show the CHEMO ALERT CARD at check-in to the Emergency Department and triage nurse.    

## 2017-07-28 NOTE — Assessment & Plan Note (Signed)
She has poor memory and signs of dementia. I do not believe she is capable of making her own medical decision and I defer to her sister to make decision for her. According to his sister, she has good compliance taking all the medications as instructed The patient will be relocating to [facility] to get better support locally and I agree with the plan

## 2017-07-28 NOTE — Assessment & Plan Note (Signed)
she will continue current medical management. I recommend close follow-up with primary care doctor for medication adjustment.  

## 2017-07-28 NOTE — Telephone Encounter (Signed)
Printed avs and calender for October appointment. Per 9/11 los.

## 2017-07-28 NOTE — Assessment & Plan Note (Signed)
Her cancer pain is improving with improved disease control. She is not taking much MS Contin. We will continue current pain management without changes.

## 2017-07-28 NOTE — Progress Notes (Signed)
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 Revlimid was stopped in July and resumed in October      04/01/2016 Adverse Reaction    Treatment was placed on hold temporarily due to neutropenia       INTERVAL HISTORY: Please see below for problem oriented charting. She returns for further follow-up Her sister noted that the patient had persistent memory issues She is compliant taking all medications as directed The patient denies peripheral neuropathy Her pain is well controlled with current prescription pain medicine Denies nausea, vomiting or constipation. Her weight is stable.  REVIEW OF SYSTEMS:   Constitutional: Denies fevers, chills or abnormal weight loss Eyes: Denies blurriness of vision Ears, nose, mouth, throat, and face: Denies mucositis or sore throat Respiratory: Denies cough, dyspnea or wheezes Cardiovascular: Denies palpitation, chest discomfort or lower extremity swelling Gastrointestinal:  Denies nausea, heartburn or change in bowel habits Skin: Denies abnormal skin rashes Lymphatics: Denies new lymphadenopathy or easy bruising Neurological:Denies numbness, tingling or new weaknesses Behavioral/Psych: Mood is stable, no new changes  All other systems were reviewed with the patient and are negative.  I have reviewed the  past medical history, past surgical history, social history and family history with the patient and they are unchanged from previous note.  ALLERGIES:  is allergic to aspirin; pravastatin; and zocor [simvastatin].  MEDICATIONS:  Current Outpatient Prescriptions  Medication Sig Dispense Refill  . acyclovir (ZOVIRAX) 400 MG tablet TAKE 1 TABLET BY MOUTH ONCE DAILY 90 tablet 3  . cephALEXin (KEFLEX) 500 MG capsule Take 1 capsule (500 mg total) by mouth 2 (two) times daily. 14 capsule 0  . dexamethasone (DECADRON) 2 MG tablet Take 1 tablet (2 mg total) by mouth daily. 60 tablet 1  . lisinopril (PRINIVIL,ZESTRIL) 40 MG tablet Take 1 tablet (40 mg total) by mouth daily. 30 tablet 11  . morphine (MS CONTIN) 15 MG 12 hr tablet Take 1 tablet (15 mg total) by mouth every 12 (twelve) hours. 60 tablet 0  . oxyCODONE-acetaminophen (ROXICET) 5-325 MG tablet Take 1 tablet by mouth every 6 (six) hours as needed for severe pain. 90 tablet 0  . REVLIMID 10 MG capsule Take 1 capsule by mouth every day for 21 days on, then 7 days off. 21 capsule 0   No current facility-administered medications for this visit.     PHYSICAL EXAMINATION: ECOG PERFORMANCE STATUS: 0 - Asymptomatic  Vitals:   07/28/17 0913  BP: (!) 149/65  Pulse: (!) 58  Resp: 18  Temp: 98.2 F (36.8 C)  SpO2: 100%   Filed Weights   07/28/17 0913  Weight: 138 lb 3.2 oz (62.7 kg)    GENERAL:alert, no distress and comfortable SKIN: skin color, texture, turgor are normal, no rashes or significant lesions EYES: normal,  Conjunctiva are pink and non-injected, sclera clear OROPHARYNX:no exudate, no erythema and lips, buccal mucosa, and tongue normal  NECK: supple, thyroid normal size, non-tender, without nodularity LYMPH:  no palpable lymphadenopathy in the cervical, axillary or inguinal LUNGS: clear to auscultation and percussion with normal breathing effort HEART: regular rate & rhythm and no murmurs and no lower extremity  edema ABDOMEN:abdomen soft, non-tender and normal bowel sounds Musculoskeletal:no cyanosis of digits and no clubbing  NEURO: alert & oriented x 3 with fluent speech, no focal motor/sensory deficits  LABORATORY DATA:  I have reviewed the data as listed    Component Value Date/Time   NA 143 07/28/2017 0855   K 4.0 07/28/2017 0855   CL 107 10/06/2016 0526   CO2 25 07/28/2017 0855   GLUCOSE 88 07/28/2017 0855   BUN 20.5 07/28/2017 0855   CREATININE 0.7 07/28/2017 0855   CALCIUM 9.3 07/28/2017 0855   PROT 6.2 (L) 07/28/2017 0855   ALBUMIN 3.5 07/28/2017 0855   AST 16 07/28/2017 0855   ALT 24 07/28/2017 0855   ALKPHOS 49 07/28/2017 0855   BILITOT 0.54 07/28/2017 0855   GFRNONAA >60 10/06/2016 0526   GFRNONAA 64 01/17/2016 0910   GFRAA >60 10/06/2016 0526   GFRAA 74 01/17/2016 0910    No results found for: SPEP, UPEP  Lab Results  Component Value Date   WBC 6.9 07/28/2017   NEUTROABS 4.3 07/28/2017   HGB 12.5 07/28/2017   HCT 37.9 07/28/2017   MCV 95.0 07/28/2017   PLT 181 07/28/2017      Chemistry      Component Value Date/Time   NA 143 07/28/2017 0855   K 4.0 07/28/2017 0855   CL 107 10/06/2016 0526   CO2 25 07/28/2017 0855   BUN 20.5 07/28/2017 0855   CREATININE 0.7 07/28/2017 0855      Component Value Date/Time   CALCIUM 9.3 07/28/2017 0855   ALKPHOS 49 07/28/2017 0855   AST 16 07/28/2017 0855   ALT 24 07/28/2017 0855   BILITOT 0.54 07/28/2017 0855       RADIOGRAPHIC STUDIES: I have personally reviewed the radiological images as listed and agreed with the findings in the report. No results found.  ASSESSMENT & PLAN:  Multiple myeloma in relapse ([facility]) With recent dose adjustment of Revlimid, she has better control of her disease Some myeloma panel showed near complete response to therapy Since I commenced dexamethasone taper to 2 mg daily, she have lost some weight.  I recommend we continue the same dose for now She will continue Velcade every other  week She will continue aspirin therapy to prevent DVT She will continue calcium with vitamin D along with Zometa She will continue acyclovir for antimicrobial prophylaxis  Essential hypertension she will continue current medical management. I recommend close follow-up with primary care doctor for medication adjustment.   Cancer associated pain Her cancer pain is improving with improved disease control. She is not taking much MS Contin. We will continue current pain management without changes.   Memory deficits She has poor memory and signs of dementia. I do not believe she is capable of making her own medical decision and I defer to her sister to make decision for her. According to his sister, she has good compliance taking all the medications as instructed The patient will be relocating to  to get better support locally and I agree with the plan   No orders of the defined types were placed in this encounter.  All questions   were answered. The patient knows to call the clinic with any problems, questions or concerns. No barriers to learning was detected. I spent 15 minutes counseling the patient face to face. The total time spent in the appointment was 20 minutes and more than 50% was on counseling and review of test results      , MD 07/28/2017 11:12 AM  

## 2017-08-05 MED FILL — LISINOPRIL 40 MG TABLET: 40 | 30 days supply | Qty: 30 | Fill #10

## 2017-08-07 ENCOUNTER — Other Ambulatory Visit: Payer: Self-pay | Admitting: *Deleted

## 2017-08-07 MED ORDER — REVLIMID 10 MG PO CAPS: ORAL_CAPSULE | ORAL | 0 refills | Status: DC

## 2017-08-11 ENCOUNTER — Encounter: Payer: Self-pay | Admitting: Podiatry

## 2017-08-11 ENCOUNTER — Ambulatory Visit (HOSPITAL_BASED_OUTPATIENT_CLINIC_OR_DEPARTMENT_OTHER): Payer: Medicare HMO

## 2017-08-11 ENCOUNTER — Other Ambulatory Visit (HOSPITAL_BASED_OUTPATIENT_CLINIC_OR_DEPARTMENT_OTHER): Payer: Medicare HMO

## 2017-08-11 ENCOUNTER — Ambulatory Visit (INDEPENDENT_AMBULATORY_CARE_PROVIDER_SITE_OTHER): Payer: Medicare HMO | Admitting: Podiatry

## 2017-08-11 VITALS — BP 142/73 | HR 50 | Temp 98.1°F | Resp 17

## 2017-08-11 VITALS — BP 134/76 | HR 61 | Resp 16

## 2017-08-11 DIAGNOSIS — C9002 Multiple myeloma in relapse: Secondary | ICD-10-CM

## 2017-08-11 DIAGNOSIS — M79676 Pain in unspecified toe(s): Secondary | ICD-10-CM

## 2017-08-11 DIAGNOSIS — Z5112 Encounter for antineoplastic immunotherapy: Secondary | ICD-10-CM | POA: Diagnosis not present

## 2017-08-11 DIAGNOSIS — B351 Tinea unguium: Secondary | ICD-10-CM | POA: Diagnosis not present

## 2017-08-11 DIAGNOSIS — C9001 Multiple myeloma in remission: Secondary | ICD-10-CM | POA: Diagnosis not present

## 2017-08-11 LAB — CBC WITH DIFFERENTIAL/PLATELET
BASO%: 0.3 % (ref 0.0–2.0)
BASOS ABS: 0 10*3/uL (ref 0.0–0.1)
EOS%: 2.9 % (ref 0.0–7.0)
Eosinophils Absolute: 0.2 10*3/uL (ref 0.0–0.5)
HEMATOCRIT: 37.4 % (ref 34.8–46.6)
HEMOGLOBIN: 11.8 g/dL (ref 11.6–15.9)
LYMPH#: 1.6 10*3/uL (ref 0.9–3.3)
LYMPH%: 25.4 % (ref 14.0–49.7)
MCH: 30.9 pg (ref 25.1–34.0)
MCHC: 31.6 g/dL (ref 31.5–36.0)
MCV: 97.9 fL (ref 79.5–101.0)
MONO#: 0.7 10*3/uL (ref 0.1–0.9)
MONO%: 10.6 % (ref 0.0–14.0)
NEUT#: 3.8 10*3/uL (ref 1.5–6.5)
NEUT%: 60.8 % (ref 38.4–76.8)
PLATELETS: 151 10*3/uL (ref 145–400)
RBC: 3.82 10*6/uL (ref 3.70–5.45)
RDW: 17.1 % — AB (ref 11.2–14.5)
WBC: 6.2 10*3/uL (ref 3.9–10.3)

## 2017-08-11 LAB — COMPREHENSIVE METABOLIC PANEL
ALBUMIN: 3.4 g/dL — AB (ref 3.5–5.0)
ALK PHOS: 43 U/L (ref 40–150)
ALT: 16 U/L (ref 0–55)
AST: 16 U/L (ref 5–34)
Anion Gap: 7 mEq/L (ref 3–11)
BILIRUBIN TOTAL: 0.66 mg/dL (ref 0.20–1.20)
BUN: 18.1 mg/dL (ref 7.0–26.0)
CO2: 27 mEq/L (ref 22–29)
CREATININE: 0.7 mg/dL (ref 0.6–1.1)
Calcium: 9 mg/dL (ref 8.4–10.4)
Chloride: 110 mEq/L — ABNORMAL HIGH (ref 98–109)
EGFR: 81 mL/min/{1.73_m2} — ABNORMAL LOW (ref >90)
GLUCOSE: 95 mg/dL (ref 70–140)
Potassium: 3.8 mEq/L (ref 3.5–5.1)
SODIUM: 144 meq/L (ref 136–145)
TOTAL PROTEIN: 6 g/dL — AB (ref 6.4–8.3)

## 2017-08-11 MED ORDER — PROCHLORPERAZINE MALEATE 10 MG PO TABS
ORAL_TABLET | ORAL | Status: AC
  Filled 2017-08-11: qty 1

## 2017-08-11 MED ORDER — BORTEZOMIB CHEMO SQ INJECTION 3.5 MG (2.5MG/ML)
0.9750 mg/m2 | Freq: Once | INTRAMUSCULAR | Status: AC
  Administered 2017-08-11: 1.5 mg via SUBCUTANEOUS
  Filled 2017-08-11: qty 1.5

## 2017-08-11 MED ORDER — PROCHLORPERAZINE MALEATE 10 MG PO TABS
10.0000 mg | ORAL_TABLET | Freq: Once | ORAL | Status: AC
  Administered 2017-08-11: 10 mg via ORAL

## 2017-08-11 NOTE — Patient Instructions (Signed)
[facility] Discharge Instructions for Patients Receiving Chemotherapy  Today you received the following chemotherapy agents Velcade. To help prevent nausea and vomiting after your treatment, we encourage you to take your nausea medication as directed.  If you develop nausea and vomiting that is not controlled by your nausea medication, call the clinic.   BELOW ARE SYMPTOMS THAT SHOULD BE REPORTED IMMEDIATELY:  *FEVER GREATER THAN 100.5 F  *CHILLS WITH OR WITHOUT FEVER  NAUSEA AND VOMITING THAT IS NOT CONTROLLED WITH YOUR NAUSEA MEDICATION  *UNUSUAL SHORTNESS OF BREATH  *UNUSUAL BRUISING OR BLEEDING  TENDERNESS IN MOUTH AND THROAT WITH OR WITHOUT PRESENCE OF ULCERS  *URINARY PROBLEMS  *BOWEL PROBLEMS  UNUSUAL RASH Items with * indicate a potential emergency and should be followed up as soon as possible.  Feel free to call the clinic you have any questions or concerns. The clinic phone number is [phone].  Please show the CHEMO ALERT CARD at check-in to the Emergency Department and triage nurse.    

## 2017-08-11 NOTE — Progress Notes (Signed)
Subjective:  Patient ID: Kimberly Friedman, female    DOB: 1936-08-16,  MRN: [id number] HPI Chief Complaint  Patient presents with  . Nail Problem    Toenails-especially hallux nails bilateral, thick and discolored, hurts wtih shoes, PCP recommended visit here    81 y.o. female presents with the above complaint.     Past Medical History:  Diagnosis Date  . DDD (degenerative disc disease), lumbar   . Hypertension   . Multiple myeloma ([place]) 01/21/2016  . PMR (polymyalgia rheumatica) (HCC)    Past Surgical History:  Procedure Laterality Date  . HEMORRHOID SURGERY      Current Outpatient Prescriptions:  .  aspirin EC 81 MG tablet, Take 81 mg by mouth daily., Disp: , Rfl:  .  bortezomib IV (VELCADE) 3.5 MG injection, Inject 1.3 mg/m2 into the vein once. Once every other week, Disp: , Rfl:  .  Cholecalciferol (VITAMIN D PO), Take by mouth., Disp: , Rfl:  .  prochlorperazine (COMPAZINE) 10 MG tablet, Take 10 mg by mouth every 6 (six) hours as needed for nausea or vomiting., Disp: , Rfl:  .  acyclovir (ZOVIRAX) 400 MG tablet, TAKE 1 TABLET BY MOUTH ONCE DAILY, Disp: 90 tablet, Rfl: 3 .  cephALEXin (KEFLEX) 500 MG capsule, Take 1 capsule (500 mg total) by mouth 2 (two) times daily., Disp: 14 capsule, Rfl: 0 .  dexamethasone (DECADRON) 2 MG tablet, Take 1 tablet (2 mg total) by mouth daily., Disp: 60 tablet, Rfl: 1 .  lisinopril (PRINIVIL,ZESTRIL) 40 MG tablet, Take 1 tablet (40 mg total) by mouth daily., Disp: 30 tablet, Rfl: 11 .  morphine (MS CONTIN) 15 MG 12 hr tablet, Take 1 tablet (15 mg total) by mouth every 12 (twelve) hours., Disp: 60 tablet, Rfl: 0 .  oxyCODONE-acetaminophen (ROXICET) 5-325 MG tablet, Take 1 tablet by mouth every 6 (six) hours as needed for severe pain., Disp: 90 tablet, Rfl: 0 .  REVLIMID 10 MG capsule, Take 1 capsule by mouth every day for 21 days on, then 7 days off., Disp: 21 capsule, Rfl: 0  Allergies  Allergen Reactions  . Aspirin Nausea And Vomiting  .  Pravastatin     Myalgia's  . Zocor [Simvastatin]     Myalgia's   Review of Systems  Constitutional: Positive for appetite change, fatigue and unexpected weight change.  Musculoskeletal: Positive for back pain.  Hematological: Bruises/bleeds easily.  All other systems reviewed and are negative.  Objective:   Vitals:   08/11/17 1443  BP: 134/76  Pulse: 61  Resp: 16    General: Well developed, nourished, in no acute distress, alert and oriented x3   Dermatological: Skin is warm, dry and supple bilateral. Nails x 10 are well maintained; remaining integument appears unremarkable at this time. There are no open sores, no preulcerative lesions, no rash or signs of infection present.Toenails are thick yellow dystrophic clinic for mycotic particularly the hallux nails bilaterally are extremely mycotic thick and painful. No purulence no malodor from these.  Vascular: Dorsalis Pedis artery and Posterior Tibial artery pedal pulses are 2/4 bilateral with immedate capillary fill time. Pedal hair growth present. No varicosities and no lower extremity edema present bilateral.   Neruologic: Grossly intact via light touch bilateral. Vibratory intact via tuning fork bilateral. Protective threshold with Semmes Wienstein monofilament intact to all pedal sites bilateral. Patellar and Achilles deep tendon reflexes 2+ bilateral. No Babinski or clonus noted bilateral.   Musculoskeletal: No gross boney pedal deformities bilateral. No pain, crepitus, or limitation  noted with foot and ankle range of motion bilateral. Muscular strength 5/5 in all groups tested bilateral.  Gait: Unassisted, Nonantalgic.    Radiographs:  None taken  Assessment & Plan:   Assessment: Pain in limb secondary to onychomycosis 1 through 5 bilateral  Plan: Debridement of toenails 1 through 5 bilateral. Follow up with Dr. Prudence Davidson or Dr. Amalia Hailey in 3 months     Max T. Quinlan, [place]

## 2017-08-12 LAB — KAPPA/LAMBDA LIGHT CHAINS
IG KAPPA FREE LIGHT CHAIN: 52.4 mg/L — AB (ref 3.3–19.4)
IG LAMBDA FREE LIGHT CHAIN: 9 mg/L (ref 5.7–26.3)
Kappa/Lambda FluidC Ratio: 5.82 — ABNORMAL HIGH (ref 0.26–1.65)

## 2017-08-14 LAB — MULTIPLE MYELOMA PANEL, SERUM
ALBUMIN SERPL ELPH-MCNC: 3.3 g/dL (ref 2.9–4.4)
Albumin/Glob SerPl: 1.4 (ref 0.7–1.7)
Alpha 1: 0.2 g/dL (ref 0.0–0.4)
Alpha2 Glob SerPl Elph-Mcnc: 0.8 g/dL (ref 0.4–1.0)
B-Globulin SerPl Elph-Mcnc: 1 g/dL (ref 0.7–1.3)
GAMMA GLOB SERPL ELPH-MCNC: 0.4 g/dL (ref 0.4–1.8)
Globulin, Total: 2.4 g/dL (ref 2.2–3.9)
IGA/IMMUNOGLOBULIN A, SERUM: 129 mg/dL (ref 64–422)
IGM (IMMUNOGLOBIN M), SRM: 9 mg/dL — AB (ref 26–217)
IgG, Qn, Serum: 489 mg/dL — ABNORMAL LOW (ref 700–1600)
M Protein SerPl Elph-Mcnc: 0.2 g/dL — ABNORMAL HIGH
TOTAL PROTEIN: 5.7 g/dL — AB (ref 6.0–8.5)

## 2017-08-20 MED FILL — DEXAMETHASONE 2 MG TABLET: 2 | 30 days supply | Qty: 30 | Fill #1

## 2017-08-25 ENCOUNTER — Telehealth: Payer: Self-pay | Admitting: Hematology and Oncology

## 2017-08-25 ENCOUNTER — Encounter: Payer: Self-pay | Admitting: Hematology and Oncology

## 2017-08-25 ENCOUNTER — Other Ambulatory Visit (HOSPITAL_BASED_OUTPATIENT_CLINIC_OR_DEPARTMENT_OTHER): Payer: Medicare HMO

## 2017-08-25 ENCOUNTER — Ambulatory Visit (HOSPITAL_BASED_OUTPATIENT_CLINIC_OR_DEPARTMENT_OTHER): Payer: Medicare HMO | Admitting: Hematology and Oncology

## 2017-08-25 ENCOUNTER — Ambulatory Visit (HOSPITAL_BASED_OUTPATIENT_CLINIC_OR_DEPARTMENT_OTHER): Payer: Medicare HMO

## 2017-08-25 DIAGNOSIS — Z5112 Encounter for antineoplastic immunotherapy: Secondary | ICD-10-CM | POA: Diagnosis not present

## 2017-08-25 DIAGNOSIS — R413 Other amnesia: Secondary | ICD-10-CM | POA: Diagnosis not present

## 2017-08-25 DIAGNOSIS — C9002 Multiple myeloma in relapse: Secondary | ICD-10-CM

## 2017-08-25 DIAGNOSIS — C9001 Multiple myeloma in remission: Secondary | ICD-10-CM

## 2017-08-25 DIAGNOSIS — G893 Neoplasm related pain (acute) (chronic): Secondary | ICD-10-CM

## 2017-08-25 LAB — CBC WITH DIFFERENTIAL/PLATELET
BASO%: 0.3 % (ref 0.0–2.0)
Basophils Absolute: 0 10*3/uL (ref 0.0–0.1)
EOS ABS: 0 10*3/uL (ref 0.0–0.5)
EOS%: 0.3 % (ref 0.0–7.0)
HCT: 38.1 % (ref 34.8–46.6)
HGB: 12.6 g/dL (ref 11.6–15.9)
LYMPH%: 26.1 % (ref 14.0–49.7)
MCH: 31.7 pg (ref 25.1–34.0)
MCHC: 33.1 g/dL (ref 31.5–36.0)
MCV: 95.8 fL (ref 79.5–101.0)
MONO#: 0.5 10*3/uL (ref 0.1–0.9)
MONO%: 11.5 % (ref 0.0–14.0)
NEUT%: 61.8 % (ref 38.4–76.8)
NEUTROS ABS: 2.7 10*3/uL (ref 1.5–6.5)
PLATELETS: 163 10*3/uL (ref 145–400)
RBC: 3.98 10*6/uL (ref 3.70–5.45)
RDW: 17.6 % — ABNORMAL HIGH (ref 11.2–14.5)
WBC: 4.3 10*3/uL (ref 3.9–10.3)
lymph#: 1.1 10*3/uL (ref 0.9–3.3)

## 2017-08-25 LAB — COMPREHENSIVE METABOLIC PANEL
ALT: 17 U/L (ref 0–55)
AST: 18 U/L (ref 5–34)
Albumin: 3.8 g/dL (ref 3.5–5.0)
Alkaline Phosphatase: 45 U/L (ref 40–150)
Anion Gap: 10 mEq/L (ref 3–11)
BUN: 13.7 mg/dL (ref 7.0–26.0)
CHLORIDE: 104 meq/L (ref 98–109)
CO2: 25 mEq/L (ref 22–29)
Calcium: 9.4 mg/dL (ref 8.4–10.4)
Creatinine: 0.8 mg/dL (ref 0.6–1.1)
EGFR: 74 mL/min/{1.73_m2} — ABNORMAL LOW (ref >90)
GLUCOSE: 108 mg/dL (ref 70–140)
POTASSIUM: 3.8 meq/L (ref 3.5–5.1)
SODIUM: 140 meq/L (ref 136–145)
Total Bilirubin: 0.64 mg/dL (ref 0.20–1.20)
Total Protein: 6.4 g/dL (ref 6.4–8.3)

## 2017-08-25 MED ORDER — BORTEZOMIB CHEMO SQ INJECTION 3.5 MG (2.5MG/ML)
0.9750 mg/m2 | Freq: Once | INTRAMUSCULAR | Status: AC
  Administered 2017-08-25: 1.5 mg via SUBCUTANEOUS
  Filled 2017-08-25: qty 1.5

## 2017-08-25 MED ORDER — PROCHLORPERAZINE MALEATE 10 MG PO TABS
ORAL_TABLET | ORAL | Status: AC
  Filled 2017-08-25: qty 1

## 2017-08-25 MED ORDER — PROCHLORPERAZINE MALEATE 10 MG PO TABS
10.0000 mg | ORAL_TABLET | Freq: Once | ORAL | Status: AC
  Administered 2017-08-25: 10 mg via ORAL

## 2017-08-25 NOTE — Progress Notes (Signed)
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 Revlimid was stopped in July and resumed in October      04/01/2016 Adverse Reaction    Treatment was placed on hold temporarily due to neutropenia       INTERVAL HISTORY: Please see below for problem oriented charting. She returns for further follow-up with her sisters She had recently relocated to [facility] No recent infection Denies pain Appetite is stable Denies peripheral neuropathy  REVIEW OF SYSTEMS:   Constitutional: Denies fevers, chills or abnormal weight loss Eyes: Denies blurriness of vision Ears, nose, mouth, throat, and face: Denies mucositis or sore throat Respiratory: Denies cough, dyspnea or wheezes Cardiovascular: Denies palpitation, chest discomfort or lower extremity swelling Gastrointestinal:  Denies nausea, heartburn or change in bowel habits Skin: Denies abnormal skin rashes Lymphatics: Denies new lymphadenopathy or easy bruising Neurological:Denies numbness, tingling or new weaknesses Behavioral/Psych: Mood is stable, no new changes  All other systems were reviewed with the patient and are negative.  I have reviewed the past medical history, past surgical history, social history and family history with the patient and they are unchanged from previous  note.  ALLERGIES:  is allergic to aspirin; pravastatin; and zocor [simvastatin].  MEDICATIONS:  Current Outpatient Prescriptions  Medication Sig Dispense Refill  . acyclovir (ZOVIRAX) 400 MG tablet TAKE 1 TABLET BY MOUTH ONCE DAILY 90 tablet 3  . aspirin EC 81 MG tablet Take 81 mg by mouth daily.    . bortezomib IV (VELCADE) 3.5 MG injection Inject 1.3 mg/m2 into the vein once. Once every other week    . cephALEXin (KEFLEX) 500 MG capsule Take 1 capsule (500 mg total) by mouth 2 (two) times daily. 14 capsule 0  . Cholecalciferol (VITAMIN D PO) Take by mouth.    . dexamethasone (DECADRON) 2 MG tablet Take 1 tablet (2 mg total) by mouth daily. 60 tablet 1  . lisinopril (PRINIVIL,ZESTRIL) 40 MG tablet Take 1 tablet (40 mg total) by mouth daily. 30 tablet 11  . morphine (MS CONTIN) 15 MG 12 hr tablet Take 1 tablet (15 mg total) by mouth every 12 (twelve) hours. 60 tablet 0  . oxyCODONE-acetaminophen (ROXICET) 5-325 MG tablet Take 1 tablet by mouth every 6 (six) hours as needed for severe pain. 90 tablet 0  . prochlorperazine (COMPAZINE) 10 MG tablet Take 10 mg by mouth every 6 (six) hours as needed for nausea or vomiting.    Marland Kitchen REVLIMID 10 MG capsule Take 1 capsule by mouth every day for 21 days on, then 7 days off. 21 capsule 0   No current facility-administered medications for this visit.     PHYSICAL EXAMINATION: ECOG PERFORMANCE STATUS: 1 - Symptomatic but completely ambulatory  Vitals:   08/25/17 0902  BP: 125/75  Pulse: 72  Resp: 17  Temp: 98.2 F (36.8 C)  SpO2: 100%   Filed Weights   08/25/17 0902  Weight: 137 lb 14.4 oz (62.6 kg)    GENERAL:alert, no distress and comfortable SKIN: skin color, texture, turgor are normal, no rashes or significant lesions EYES: normal, Conjunctiva are pink and non-injected, sclera clear OROPHARYNX:no exudate, no erythema and lips, buccal mucosa, and tongue normal  NECK: supple, thyroid normal size, non-tender, without nodularity LYMPH:   no palpable lymphadenopathy in the cervical, axillary or inguinal LUNGS: clear to auscultation and percussion with normal breathing effort HEART: regular rate & rhythm and no murmurs and no lower extremity edema ABDOMEN:abdomen soft, non-tender and normal bowel sounds Musculoskeletal:no cyanosis of digits and no clubbing  NEURO: alert & oriented x 3 with fluent speech, no focal motor/sensory deficits  LABORATORY DATA:  I have reviewed the data as listed    Component Value Date/Time   NA 144 08/11/2017 0914   K 3.8 08/11/2017 0914   CL 107 10/06/2016 0526   CO2 27 08/11/2017 0914   GLUCOSE 95 08/11/2017 0914   BUN 18.1 08/11/2017 0914   CREATININE 0.7 08/11/2017 0914   CALCIUM 9.0 08/11/2017 0914   PROT 5.7 (L) 08/11/2017 0914   PROT 6.0 (L) 08/11/2017 0914   ALBUMIN 3.4 (L) 08/11/2017 0914   AST 16 08/11/2017 0914   ALT 16 08/11/2017 0914   ALKPHOS 43 08/11/2017 0914   BILITOT 0.66 08/11/2017 0914   GFRNONAA >60 10/06/2016 0526   GFRNONAA 64 01/17/2016 0910   GFRAA >60 10/06/2016 0526   GFRAA 74 01/17/2016 0910    No results found for: SPEP, UPEP  Lab Results  Component Value Date   WBC 4.3 08/25/2017   NEUTROABS 2.7 08/25/2017   HGB 12.6 08/25/2017   HCT 38.1 08/25/2017   MCV 95.8 08/25/2017   PLT 163 08/25/2017      Chemistry      Component Value Date/Time   NA 144 08/11/2017 0914   K 3.8 08/11/2017 0914   CL 107 10/06/2016 0526   CO2 27 08/11/2017 0914   BUN 18.1 08/11/2017 0914   CREATININE 0.7 08/11/2017 0914      Component Value Date/Time   CALCIUM 9.0 08/11/2017 0914   ALKPHOS 43 08/11/2017 0914   AST 16 08/11/2017 0914   ALT 16 08/11/2017 0914   BILITOT 0.66 08/11/2017 0914      ASSESSMENT & PLAN:  Multiple myeloma in relapse ([place]) With recent dose adjustment of Revlimid, she has better control of her disease Some myeloma panel showed near complete response to therapy Since I commenced dexamethasone taper to 2 mg daily, she have lost some  weight.  I recommend we continue the same dose for now She will continue Velcade every other week She will continue aspirin therapy to prevent DVT She will continue calcium with vitamin D along with Zometa She will continue acyclovir for antimicrobial prophylaxis  Cancer associated pain Her cancer pain is improving with improved disease control. She is not taking much MS Contin. We will continue current pain management without changes.   Memory deficits She has poor memory and signs of dementia. I do not believe she is capable of making her own medical decision and I defer to her sister to make decision for her. According to his sister, she has good compliance taking all the medications as instructed She has recently successfully relocated to [facility]   No orders of the defined types were placed in this encounter.  All  questions were answered. The patient knows to call the clinic with any problems, questions or concerns. No barriers to learning was detected. I spent 15 minutes counseling the patient face to face. The total time spent in the appointment was 20 minutes and more than 50% was on counseling and review of test results     Heath Lark, MD 08/25/2017 9:34 AM

## 2017-08-25 NOTE — Assessment & Plan Note (Signed)
Her cancer pain is improving with improved disease control. She is not taking much MS Contin. We will continue current pain management without changes.

## 2017-08-25 NOTE — Assessment & Plan Note (Signed)
With recent dose adjustment of Revlimid, she has better control of her disease Some myeloma panel showed near complete response to therapy Since I commenced dexamethasone taper to 2 mg daily, she have lost some weight.  I recommend we continue the same dose for now She will continue Velcade every other week She will continue aspirin therapy to prevent DVT She will continue calcium with vitamin D along with Zometa She will continue acyclovir for antimicrobial prophylaxis

## 2017-08-25 NOTE — Assessment & Plan Note (Signed)
She has poor memory and signs of dementia. I do not believe she is capable of making her own medical decision and I defer to her sister to make decision for her. According to his sister, she has good compliance taking all the medications as instructed She has recently successfully relocated to [facility]

## 2017-08-25 NOTE — Telephone Encounter (Signed)
Gave avs and calendar for November  °

## 2017-08-25 NOTE — Patient Instructions (Signed)
[facility] Discharge Instructions for Patients Receiving Chemotherapy  Today you received the following chemotherapy agents Velcade.  To help prevent nausea and vomiting after your treatment, we encourage you to take your nausea medication as directed.  If you develop nausea and vomiting that is not controlled by your nausea medication, call the clinic.   BELOW ARE SYMPTOMS THAT SHOULD BE REPORTED IMMEDIATELY:  *FEVER GREATER THAN 100.5 F  *CHILLS WITH OR WITHOUT FEVER  NAUSEA AND VOMITING THAT IS NOT CONTROLLED WITH YOUR NAUSEA MEDICATION  *UNUSUAL SHORTNESS OF BREATH  *UNUSUAL BRUISING OR BLEEDING  TENDERNESS IN MOUTH AND THROAT WITH OR WITHOUT PRESENCE OF ULCERS  *URINARY PROBLEMS  *BOWEL PROBLEMS  UNUSUAL RASH Items with * indicate a potential emergency and should be followed up as soon as possible.  Feel free to call the clinic should you have any questions or concerns. The clinic phone number is [phone].  Please show the CHEMO ALERT CARD at check-in to the Emergency Department and triage nurse.   

## 2017-09-03 ENCOUNTER — Other Ambulatory Visit: Payer: Self-pay

## 2017-09-03 MED ORDER — REVLIMID 10 MG PO CAPS: ORAL_CAPSULE | ORAL | 0 refills | Status: DC

## 2017-09-08 ENCOUNTER — Other Ambulatory Visit (HOSPITAL_BASED_OUTPATIENT_CLINIC_OR_DEPARTMENT_OTHER): Payer: Medicare HMO

## 2017-09-08 ENCOUNTER — Encounter: Payer: Self-pay | Admitting: Hematology and Oncology

## 2017-09-08 ENCOUNTER — Ambulatory Visit (HOSPITAL_BASED_OUTPATIENT_CLINIC_OR_DEPARTMENT_OTHER): Payer: Medicare HMO | Admitting: Hematology and Oncology

## 2017-09-08 ENCOUNTER — Ambulatory Visit (HOSPITAL_BASED_OUTPATIENT_CLINIC_OR_DEPARTMENT_OTHER): Payer: Medicare HMO

## 2017-09-08 DIAGNOSIS — Z5112 Encounter for antineoplastic immunotherapy: Secondary | ICD-10-CM | POA: Diagnosis not present

## 2017-09-08 DIAGNOSIS — C9000 Multiple myeloma not having achieved remission: Secondary | ICD-10-CM

## 2017-09-08 DIAGNOSIS — C9002 Multiple myeloma in relapse: Secondary | ICD-10-CM

## 2017-09-08 DIAGNOSIS — Y92009 Unspecified place in unspecified non-institutional (private) residence as the place of occurrence of the external cause: Secondary | ICD-10-CM

## 2017-09-08 DIAGNOSIS — M48061 Spinal stenosis, lumbar region without neurogenic claudication: Secondary | ICD-10-CM

## 2017-09-08 DIAGNOSIS — C9001 Multiple myeloma in remission: Secondary | ICD-10-CM | POA: Diagnosis not present

## 2017-09-08 DIAGNOSIS — W19XXXA Unspecified fall, initial encounter: Secondary | ICD-10-CM | POA: Diagnosis not present

## 2017-09-08 LAB — CBC WITH DIFFERENTIAL/PLATELET
BASO%: 0.6 % (ref 0.0–2.0)
Basophils Absolute: 0 10*3/uL (ref 0.0–0.1)
EOS%: 0.9 % (ref 0.0–7.0)
Eosinophils Absolute: 0.1 10*3/uL (ref 0.0–0.5)
HEMATOCRIT: 38.2 % (ref 34.8–46.6)
HGB: 12.7 g/dL (ref 11.6–15.9)
LYMPH#: 1 10*3/uL (ref 0.9–3.3)
LYMPH%: 14.2 % (ref 14.0–49.7)
MCH: 32.1 pg (ref 25.1–34.0)
MCHC: 33.2 g/dL (ref 31.5–36.0)
MCV: 96.6 fL (ref 79.5–101.0)
MONO#: 0.9 10*3/uL (ref 0.1–0.9)
MONO%: 12 % (ref 0.0–14.0)
NEUT%: 72.3 % (ref 38.4–76.8)
NEUTROS ABS: 5.2 10*3/uL (ref 1.5–6.5)
PLATELETS: 163 10*3/uL (ref 145–400)
RBC: 3.95 10*6/uL (ref 3.70–5.45)
RDW: 17.3 % — AB (ref 11.2–14.5)
WBC: 7.2 10*3/uL (ref 3.9–10.3)

## 2017-09-08 LAB — COMPREHENSIVE METABOLIC PANEL
ALBUMIN: 3.6 g/dL (ref 3.5–5.0)
ALT: 22 U/L (ref 0–55)
ANION GAP: 11 meq/L (ref 3–11)
AST: 19 U/L (ref 5–34)
Alkaline Phosphatase: 48 U/L (ref 40–150)
BILIRUBIN TOTAL: 0.69 mg/dL (ref 0.20–1.20)
BUN: 18.4 mg/dL (ref 7.0–26.0)
CALCIUM: 8.9 mg/dL (ref 8.4–10.4)
CO2: 26 mEq/L (ref 22–29)
Chloride: 107 mEq/L (ref 98–109)
Creatinine: 0.7 mg/dL (ref 0.6–1.1)
Glucose: 98 mg/dl (ref 70–140)
POTASSIUM: 4.1 meq/L (ref 3.5–5.1)
SODIUM: 144 meq/L (ref 136–145)
TOTAL PROTEIN: 6.4 g/dL (ref 6.4–8.3)

## 2017-09-08 MED ORDER — BORTEZOMIB CHEMO SQ INJECTION 3.5 MG (2.5MG/ML)
0.9750 mg/m2 | Freq: Once | INTRAMUSCULAR | Status: AC
  Administered 2017-09-08: 1.5 mg via SUBCUTANEOUS
  Filled 2017-09-08: qty 1.5

## 2017-09-08 MED ORDER — OXYCODONE-ACETAMINOPHEN 5-325 MG PO TABS: 1.0000 | ORAL_TABLET | Freq: Four times a day (QID) | ORAL | 0 refills | Status: DC | PRN

## 2017-09-08 MED ORDER — PROCHLORPERAZINE MALEATE 10 MG PO TABS
ORAL_TABLET | ORAL | Status: AC
  Filled 2017-09-08: qty 1

## 2017-09-08 MED ORDER — PROCHLORPERAZINE MALEATE 10 MG PO TABS
10.0000 mg | ORAL_TABLET | Freq: Once | ORAL | Status: AC
  Administered 2017-09-08: 10 mg via ORAL

## 2017-09-08 MED ORDER — ZOLEDRONIC ACID 4 MG/5ML IV CONC
3.5000 mg | Freq: Once | INTRAVENOUS | Status: AC
  Administered 2017-09-08: 3.5 mg via INTRAVENOUS
  Filled 2017-09-08: qty 4.38

## 2017-09-08 MED FILL — OXYCOD/ACETAMINOPHEN 5-325M: 5-325 | 22 days supply | Qty: 90 | Fill #0

## 2017-09-08 MED FILL — LISINOPRIL 40 MG TABLET: 40 | 30 days supply | Qty: 30 | Fill #11

## 2017-09-08 NOTE — Patient Instructions (Signed)
[facility] Discharge Instructions for Patients Receiving Chemotherapy  Today you received the following chemotherapy agents Velcade, Zometa.  To help prevent nausea and vomiting after your treatment, we encourage you to take your nausea medication as directed   If you develop nausea and vomiting that is not controlled by your nausea medication, call the clinic.   BELOW ARE SYMPTOMS THAT SHOULD BE REPORTED IMMEDIATELY:  *FEVER GREATER THAN 100.5 F  *CHILLS WITH OR WITHOUT FEVER  NAUSEA AND VOMITING THAT IS NOT CONTROLLED WITH YOUR NAUSEA MEDICATION  *UNUSUAL SHORTNESS OF BREATH  *UNUSUAL BRUISING OR BLEEDING  TENDERNESS IN MOUTH AND THROAT WITH OR WITHOUT PRESENCE OF ULCERS  *URINARY PROBLEMS  *BOWEL PROBLEMS  UNUSUAL RASH Items with * indicate a potential emergency and should be followed up as soon as possible.  Feel free to call the clinic should you have any questions or concerns. The clinic phone number is [phone].  Please show the CHEMO ALERT CARD at check-in to the Emergency Department and triage nurse.   

## 2017-09-08 NOTE — Progress Notes (Signed)
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 Revlimid was stopped in July and resumed in October      04/01/2016 Adverse Reaction    Treatment was placed on hold temporarily due to neutropenia       INTERVAL HISTORY: Please see below for problem oriented charting. She is seen urgent per request from her family. She called her sister yesterday after she fell against hard concrete near her yard She scraped her knees and elbow and bruised her lower extremities The wound is cleaned by her sister She did not report presyncopal episodes leading to the event The patient denies any recent signs or symptoms of bleeding such as spontaneous epistaxis, hematuria or hematochezia.  REVIEW OF SYSTEMS:   Constitutional: Denies fevers, chills or abnormal weight loss Eyes: Denies blurriness of vision Ears, nose, mouth, throat, and face: Denies mucositis or sore throat Respiratory: Denies cough, dyspnea or wheezes Cardiovascular: Denies palpitation, chest discomfort or lower extremity swelling Gastrointestinal:  Denies nausea, heartburn or change in bowel habits Skin: Denies abnormal skin rashes Lymphatics: Denies new lymphadenopathy or easy bruising Neurological:Denies numbness, tingling or new weaknesses Behavioral/Psych: Mood is stable, no new changes   All other systems were reviewed with the patient and are negative.  I have reviewed the past medical history, past surgical history, social history and family history with the patient and they are unchanged from previous note.  ALLERGIES:  is allergic to aspirin; pravastatin; and zocor [simvastatin].  MEDICATIONS:  Current Outpatient Prescriptions  Medication Sig Dispense Refill  . acyclovir (ZOVIRAX) 400 MG tablet TAKE 1 TABLET BY MOUTH ONCE DAILY 90 tablet 3  . aspirin EC 81 MG tablet Take 81 mg by mouth daily.    . bortezomib IV (VELCADE) 3.5 MG injection Inject 1.3 mg/m2 into the vein once. Once every other week    . cephALEXin (KEFLEX) 500 MG capsule Take 1 capsule (500 mg total) by mouth 2 (two) times daily. 14 capsule 0  . Cholecalciferol (VITAMIN D PO) Take by mouth.    . dexamethasone (DECADRON) 2 MG tablet Take 1 tablet (2 mg total) by mouth daily. 60 tablet 1  . lisinopril (PRINIVIL,ZESTRIL) 40 MG tablet Take 1 tablet (40 mg total) by mouth daily. 30 tablet 11  . morphine (MS CONTIN) 15 MG 12 hr tablet Take 1 tablet (15 mg total) by mouth every 12 (twelve) hours. 60 tablet 0  . oxyCODONE-acetaminophen (ROXICET) 5-325 MG tablet Take 1 tablet by mouth every 6 (six) hours as needed for severe pain. 90 tablet 0  . prochlorperazine (COMPAZINE) 10 MG tablet Take 10 mg by mouth every 6 (six) hours as needed for nausea or vomiting.    Marland Kitchen REVLIMID 10 MG capsule Take 1 capsule by mouth every day for 21 days on,  then 7 days off. 21 capsule 0   No current facility-administered medications for this visit.     PHYSICAL EXAMINATION: ECOG PERFORMANCE STATUS: 1 - Symptomatic but completely ambulatory  Vitals:   09/08/17 1002  BP: (!) 130/59  Pulse: 68  Resp: 20  Temp: 97.9 F (36.6 C)  SpO2: 97%   Filed Weights   09/08/17 1002  Weight: 141 lb 6.4 oz (64.1 kg)    GENERAL:alert, no distress and comfortable SKIN: SHe has extensive bruises on her forehead, near her eye, elbows and  bilateral lower extremities. I bandaged her right knee EYES: normal, Conjunctiva are pink and non-injected, sclera clear OROPHARYNX:no exudate, no erythema and lips, buccal mucosa, and tongue normal  NECK: supple, thyroid normal size, non-tender, without nodularity LYMPH:  no palpable lymphadenopathy in the cervical, axillary or inguinal LUNGS: clear to auscultation and percussion with normal breathing effort HEART: regular rate & rhythm and no murmurs and no lower extremity edema ABDOMEN:abdomen soft, non-tender and normal bowel sounds Musculoskeletal:no cyanosis of digits and no clubbing  NEURO: alert & oriented x 3 with fluent speech, no focal motor/sensory deficits  LABORATORY DATA:  I have reviewed the data as listed    Component Value Date/Time   NA 144 09/08/2017 0921   K 4.1 09/08/2017 0921   CL 107 10/06/2016 0526   CO2 26 09/08/2017 0921   GLUCOSE 98 09/08/2017 0921   BUN 18.4 09/08/2017 0921   CREATININE 0.7 09/08/2017 0921   CALCIUM 8.9 09/08/2017 0921   PROT 6.4 09/08/2017 0921   ALBUMIN 3.6 09/08/2017 0921   AST 19 09/08/2017 0921   ALT 22 09/08/2017 0921   ALKPHOS 48 09/08/2017 0921   BILITOT 0.69 09/08/2017 0921   GFRNONAA >60 10/06/2016 0526   GFRNONAA 64 01/17/2016 0910   GFRAA >60 10/06/2016 0526   GFRAA 74 01/17/2016 0910    No results found for: SPEP, UPEP  Lab Results  Component Value Date   WBC 7.2 09/08/2017   NEUTROABS 5.2 09/08/2017   HGB 12.7 09/08/2017   HCT 38.2 09/08/2017   MCV 96.6 09/08/2017   PLT 163 09/08/2017      Chemistry      Component Value Date/Time   NA 144 09/08/2017 0921   K 4.1 09/08/2017 0921   CL 107 10/06/2016 0526   CO2 26 09/08/2017 0921   BUN 18.4 09/08/2017 0921   CREATININE 0.7 09/08/2017 0921      Component Value Date/Time   CALCIUM 8.9 09/08/2017 0921   ALKPHOS 48 09/08/2017 0921   AST 19 09/08/2017 0921   ALT 22 09/08/2017 0921   BILITOT 0.69 09/08/2017 0921       ASSESSMENT & PLAN:  Fall at  home, initial encounter Her fall was purely accidental in nature She scraped her knees and bruised her lower extremities The area over her right knee cap looks clean but revealed a small area of open sore I placed Kerlix and wrapped her right knee If it improves tomorrow, a bandaid should be sufficient  Multiple myeloma in relapse ([place]) In view of open wound, I allow her to proceed with Velcade buy recommend holding off Revlimid for 1 week to allow adequate wound healing She may resume next week   No orders of the defined types were placed in this encounter.  All questions were answered. The patient knows to call the clinic with any problems, questions or concerns. No barriers to learning was detected. I spent 15 minutes counseling the patient face to  face. The total time spent in the appointment was 20 minutes and more than 50% was on counseling and review of test results     Heath Lark, MD 09/08/2017 7:21 PM

## 2017-09-08 NOTE — Assessment & Plan Note (Signed)
Her fall was purely accidental in nature She scraped her knees and bruised her lower extremities The area over her right knee cap looks clean but revealed a small area of open sore I placed Kerlix and wrapped her right knee If it improves tomorrow, a bandaid should be sufficient

## 2017-09-08 NOTE — Assessment & Plan Note (Signed)
In view of open wound, I allow her to proceed with Velcade buy recommend holding off Revlimid for 1 week to allow adequate wound healing She may resume next week

## 2017-09-09 ENCOUNTER — Telehealth: Payer: Self-pay | Admitting: Hematology and Oncology

## 2017-09-09 LAB — KAPPA/LAMBDA LIGHT CHAINS
IG KAPPA FREE LIGHT CHAIN: 47.9 mg/L — AB (ref 3.3–19.4)
IG LAMBDA FREE LIGHT CHAIN: 12.5 mg/L (ref 5.7–26.3)
Kappa/Lambda FluidC Ratio: 3.83 — ABNORMAL HIGH (ref 0.26–1.65)

## 2017-09-09 NOTE — Telephone Encounter (Signed)
Per 10/23 los - Return for No new orders or return visit. °

## 2017-09-10 LAB — MULTIPLE MYELOMA PANEL, SERUM
ALPHA 1: 0.3 g/dL (ref 0.0–0.4)
ALPHA2 GLOB SERPL ELPH-MCNC: 0.9 g/dL (ref 0.4–1.0)
Albumin SerPl Elph-Mcnc: 3.5 g/dL (ref 2.9–4.4)
Albumin/Glob SerPl: 1.3 (ref 0.7–1.7)
B-Globulin SerPl Elph-Mcnc: 1.1 g/dL (ref 0.7–1.3)
Gamma Glob SerPl Elph-Mcnc: 0.5 g/dL (ref 0.4–1.8)
Globulin, Total: 2.7 g/dL (ref 2.2–3.9)
IGG (IMMUNOGLOBIN G), SERUM: 531 mg/dL — AB (ref 700–1600)
IGM (IMMUNOGLOBIN M), SRM: 12 mg/dL — AB (ref 26–217)
IgA, Qn, Serum: 148 mg/dL (ref 64–422)
M Protein SerPl Elph-Mcnc: 0.2 g/dL — ABNORMAL HIGH
TOTAL PROTEIN: 6.2 g/dL (ref 6.0–8.5)

## 2017-09-21 ENCOUNTER — Other Ambulatory Visit: Payer: Self-pay | Admitting: Hematology and Oncology

## 2017-09-22 ENCOUNTER — Ambulatory Visit (HOSPITAL_BASED_OUTPATIENT_CLINIC_OR_DEPARTMENT_OTHER): Payer: Medicare HMO | Admitting: Hematology and Oncology

## 2017-09-22 ENCOUNTER — Ambulatory Visit (HOSPITAL_BASED_OUTPATIENT_CLINIC_OR_DEPARTMENT_OTHER): Payer: Medicare HMO

## 2017-09-22 ENCOUNTER — Encounter: Payer: Self-pay | Admitting: Neurology

## 2017-09-22 ENCOUNTER — Other Ambulatory Visit (HOSPITAL_BASED_OUTPATIENT_CLINIC_OR_DEPARTMENT_OTHER): Payer: Medicare HMO

## 2017-09-22 ENCOUNTER — Encounter: Payer: Self-pay | Admitting: Hematology and Oncology

## 2017-09-22 ENCOUNTER — Telehealth: Payer: Self-pay | Admitting: Hematology and Oncology

## 2017-09-22 DIAGNOSIS — C9002 Multiple myeloma in relapse: Secondary | ICD-10-CM

## 2017-09-22 DIAGNOSIS — G893 Neoplasm related pain (acute) (chronic): Secondary | ICD-10-CM | POA: Diagnosis not present

## 2017-09-22 DIAGNOSIS — R531 Weakness: Secondary | ICD-10-CM | POA: Diagnosis not present

## 2017-09-22 DIAGNOSIS — R29898 Other symptoms and signs involving the musculoskeletal system: Secondary | ICD-10-CM

## 2017-09-22 DIAGNOSIS — Z5112 Encounter for antineoplastic immunotherapy: Secondary | ICD-10-CM | POA: Diagnosis not present

## 2017-09-22 DIAGNOSIS — F0390 Unspecified dementia without behavioral disturbance: Secondary | ICD-10-CM | POA: Insufficient documentation

## 2017-09-22 DIAGNOSIS — C9001 Multiple myeloma in remission: Secondary | ICD-10-CM

## 2017-09-22 LAB — COMPREHENSIVE METABOLIC PANEL
ALBUMIN: 3.3 g/dL — AB (ref 3.5–5.0)
ALT: 20 U/L (ref 0–55)
AST: 19 U/L (ref 5–34)
Alkaline Phosphatase: 51 U/L (ref 40–150)
Anion Gap: 8 mEq/L (ref 3–11)
BUN: 23.4 mg/dL (ref 7.0–26.0)
CALCIUM: 9 mg/dL (ref 8.4–10.4)
CO2: 29 mEq/L (ref 22–29)
Chloride: 107 mEq/L (ref 98–109)
Creatinine: 0.8 mg/dL (ref 0.6–1.1)
Glucose: 71 mg/dl (ref 70–140)
POTASSIUM: 3.6 meq/L (ref 3.5–5.1)
SODIUM: 144 meq/L (ref 136–145)
Total Bilirubin: 0.45 mg/dL (ref 0.20–1.20)
Total Protein: 6.1 g/dL — ABNORMAL LOW (ref 6.4–8.3)

## 2017-09-22 LAB — CBC WITH DIFFERENTIAL/PLATELET
BASO%: 0.7 % (ref 0.0–2.0)
BASOS ABS: 0 10*3/uL (ref 0.0–0.1)
EOS ABS: 0.1 10*3/uL (ref 0.0–0.5)
EOS%: 1.4 % (ref 0.0–7.0)
HEMATOCRIT: 38.2 % (ref 34.8–46.6)
HEMOGLOBIN: 12.6 g/dL (ref 11.6–15.9)
LYMPH%: 33.5 % (ref 14.0–49.7)
MCH: 31.9 pg (ref 25.1–34.0)
MCHC: 32.9 g/dL (ref 31.5–36.0)
MCV: 96.9 fL (ref 79.5–101.0)
MONO#: 0.3 10*3/uL (ref 0.1–0.9)
MONO%: 6 % (ref 0.0–14.0)
NEUT#: 3.1 10*3/uL (ref 1.5–6.5)
NEUT%: 58.4 % (ref 38.4–76.8)
Platelets: 163 10*3/uL (ref 145–400)
RBC: 3.94 10*6/uL (ref 3.70–5.45)
RDW: 17.9 % — AB (ref 11.2–14.5)
WBC: 5.3 10*3/uL (ref 3.9–10.3)
lymph#: 1.8 10*3/uL (ref 0.9–3.3)

## 2017-09-22 MED ORDER — MORPHINE SULFATE ER 15 MG PO TBCR: 15.0000 mg | EXTENDED_RELEASE_TABLET | Freq: Two times a day (BID) | ORAL | 0 refills | Status: DC

## 2017-09-22 MED ORDER — DEXAMETHASONE 1 MG PO TABS: 1.0000 mg | ORAL_TABLET | Freq: Every day | ORAL | 1 refills | Status: DC

## 2017-09-22 MED ORDER — PROCHLORPERAZINE MALEATE 10 MG PO TABS
ORAL_TABLET | ORAL | Status: AC
  Filled 2017-09-22: qty 1

## 2017-09-22 MED ORDER — PROCHLORPERAZINE MALEATE 10 MG PO TABS
10.0000 mg | ORAL_TABLET | Freq: Once | ORAL | Status: AC
  Administered 2017-09-22: 10 mg via ORAL

## 2017-09-22 MED ORDER — BORTEZOMIB CHEMO SQ INJECTION 3.5 MG (2.5MG/ML)
0.9750 mg/m2 | Freq: Once | INTRAMUSCULAR | Status: AC
  Administered 2017-09-22: 1.5 mg via SUBCUTANEOUS
  Filled 2017-09-22: qty 0.6

## 2017-09-22 MED FILL — MORPHINE SULF ER 15 MG TAB: 15 | 30 days supply | Qty: 60 | Fill #0

## 2017-09-22 MED FILL — DEXAMETHASONE 1 MG TABLET: 1 | 60 days supply | Qty: 60 | Fill #0

## 2017-09-22 NOTE — Telephone Encounter (Signed)
Scheduled appt pe r1/16 los - Gave patient AVS and calender per los.  

## 2017-09-22 NOTE — Assessment & Plan Note (Signed)
With recent dose adjustment of Revlimid, she has better control of her disease Some myeloma panel showed near complete response to therapy With her progressive weakness, I recommend dexamethasone taper to 1 mg daily She will continue Velcade every other week She will continue aspirin therapy to prevent DVT She will continue calcium with vitamin D along with Zometa She will continue acyclovir for antimicrobial prophylaxis

## 2017-09-22 NOTE — Patient Instructions (Signed)
[facility] Discharge Instructions for Patients Receiving Chemotherapy  Today you received the following chemotherapy agents Velcade To help prevent nausea and vomiting after your treatment, we encourage you to take your nausea medication as prescribed.   If you develop nausea and vomiting that is not controlled by your nausea medication, call the clinic.   BELOW ARE SYMPTOMS THAT SHOULD BE REPORTED IMMEDIATELY:  *FEVER GREATER THAN 100.5 F  *CHILLS WITH OR WITHOUT FEVER  NAUSEA AND VOMITING THAT IS NOT CONTROLLED WITH YOUR NAUSEA MEDICATION  *UNUSUAL SHORTNESS OF BREATH  *UNUSUAL BRUISING OR BLEEDING  TENDERNESS IN MOUTH AND THROAT WITH OR WITHOUT PRESENCE OF ULCERS  *URINARY PROBLEMS  *BOWEL PROBLEMS  UNUSUAL RASH Items with * indicate a potential emergency and should be followed up as soon as possible.  Feel free to call the clinic should you have any questions or concerns. The clinic phone number is [phone].  Please show the CHEMO ALERT CARD at check-in to the Emergency Department and triage nurse.   

## 2017-09-22 NOTE — Assessment & Plan Note (Signed)
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

## 2017-09-22 NOTE — Progress Notes (Signed)
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 Revlimid was stopped in July and resumed in October      04/01/2016 Adverse Reaction    Treatment was placed on hold temporarily due to neutropenia       INTERVAL HISTORY: Please see below for problem oriented charting. She returns with her sister for further follow-up Her wound from recent fall has healed up Her sister noted the patient has progressive dementia Her pain control has been stable There is no reported recent infection The patient continues to have progressive weakness overall  REVIEW OF SYSTEMS:   Constitutional: Denies fevers, chills or abnormal weight loss Eyes: Denies blurriness of vision Ears, nose, mouth, throat, and face: Denies mucositis or sore throat Respiratory: Denies cough, dyspnea or wheezes Cardiovascular: Denies palpitation, chest discomfort or lower extremity swelling Gastrointestinal:  Denies nausea, heartburn or change in bowel habits Skin: Denies abnormal skin rashes Lymphatics: Denies new lymphadenopathy or easy bruising Neurological:Denies numbness, tingling or new weaknesses Behavioral/Psych: Mood is stable, no new changes  All other systems were reviewed with the patient and are negative.  I have reviewed the past medical history, past surgical history,  social history and family history with the patient and they are unchanged from previous note.  ALLERGIES:  is allergic to aspirin; pravastatin; and zocor [simvastatin].  MEDICATIONS:  Current Outpatient Medications  Medication Sig Dispense Refill  . acyclovir (ZOVIRAX) 400 MG tablet TAKE 1 TABLET BY MOUTH ONCE DAILY 90 tablet 3  . aspirin EC 81 MG tablet Take 81 mg by mouth daily.    . bortezomib IV (VELCADE) 3.5 MG injection Inject 1.3 mg/m2 into the vein once. Once every other week    . cephALEXin (KEFLEX) 500 MG capsule Take 1 capsule (500 mg total) by mouth 2 (two) times daily. 14 capsule 0  . Cholecalciferol (VITAMIN D PO) Take by mouth.    . dexamethasone (DECADRON) 1 MG tablet Take 1 tablet (1 mg total) daily by mouth. 90 tablet 1  . lisinopril (PRINIVIL,ZESTRIL) 40 MG tablet Take 1 tablet (40 mg total) by mouth daily. 30 tablet 11  . morphine (MS CONTIN) 15 MG 12 hr tablet Take 1 tablet (15 mg total) every 12 (twelve) hours by mouth. 60 tablet 0  . oxyCODONE-acetaminophen (ROXICET) 5-325 MG tablet Take 1 tablet by mouth every 6 (six) hours as needed for severe pain. 90 tablet 0  . prochlorperazine (COMPAZINE) 10 MG tablet Take 10 mg by mouth every 6 (six) hours as needed for nausea or vomiting.    Marland Kitchen REVLIMID 10 MG capsule Take 1 capsule by mouth every day for 21 days on, then 7 days off. 21 capsule 0   No current facility-administered medications for this visit.     PHYSICAL EXAMINATION: ECOG PERFORMANCE  STATUS: 2 - Symptomatic, <50% confined to bed  Vitals:   09/22/17 1023  BP: 117/72  Pulse: 76  Resp: 18  Temp: 98.6 F (37 C)  SpO2: 98%   Filed Weights   09/22/17 1023  Weight: 143 lb 11.2 oz (65.2 kg)    GENERAL:alert, no distress and comfortable SKIN: skin color, texture, turgor are normal, no rashes or significant lesions EYES: normal, Conjunctiva are pink and non-injected, sclera clear Musculoskeletal:no cyanosis of digits and no clubbing  NEURO: alert &  oriented x 3 with fluent speech, no focal motor/sensory deficits  LABORATORY DATA:  I have reviewed the data as listed    Component Value Date/Time   NA 144 09/22/2017 1000   K 3.6 09/22/2017 1000   CL 107 10/06/2016 0526   CO2 29 09/22/2017 1000   GLUCOSE 71 09/22/2017 1000   BUN 23.4 09/22/2017 1000   CREATININE 0.8 09/22/2017 1000   CALCIUM 9.0 09/22/2017 1000   PROT 6.1 (L) 09/22/2017 1000   ALBUMIN 3.3 (L) 09/22/2017 1000   AST 19 09/22/2017 1000   ALT 20 09/22/2017 1000   ALKPHOS 51 09/22/2017 1000   BILITOT 0.45 09/22/2017 1000   GFRNONAA >60 10/06/2016 0526   GFRNONAA 64 01/17/2016 0910   GFRAA >60 10/06/2016 0526   GFRAA 74 01/17/2016 0910    No results found for: SPEP, UPEP  Lab Results  Component Value Date   WBC 5.3 09/22/2017   NEUTROABS 3.1 09/22/2017   HGB 12.6 09/22/2017   HCT 38.2 09/22/2017   MCV 96.9 09/22/2017   PLT 163 09/22/2017      Chemistry      Component Value Date/Time   NA 144 09/22/2017 1000   K 3.6 09/22/2017 1000   CL 107 10/06/2016 0526   CO2 29 09/22/2017 1000   BUN 23.4 09/22/2017 1000   CREATININE 0.8 09/22/2017 1000      Component Value Date/Time   CALCIUM 9.0 09/22/2017 1000   ALKPHOS 51 09/22/2017 1000   AST 19 09/22/2017 1000   ALT 20 09/22/2017 1000   BILITOT 0.45 09/22/2017 1000      ASSESSMENT & PLAN:  Multiple myeloma in relapse ([place]) With recent dose adjustment of Revlimid, she has better control of her disease Some myeloma panel showed near complete response to therapy With her progressive weakness, I recommend dexamethasone taper to 1 mg daily She will continue Velcade every other week She will continue aspirin therapy to prevent DVT She will continue calcium with vitamin D along with Zometa She will continue acyclovir for antimicrobial prophylaxis  Dementia Her sister noticed progressive dementia I recommend neurology consult for further review and consideration to be started on medications She agreed  with the referral  Bilateral leg weakness 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 We will continue current pain management without changes.  I refill her prescription of pain medicine today   Orders Placed This Encounter  Procedures  . Ambulatory referral to Neurology    Referral Priority:   Routine    Referral Type:   Consultation    Referral Reason:   Specialty Services Required    Requested Specialty:   Neurology    Number of Visits Requested:   1   All questions were answered. The patient knows to call the clinic with any problems, questions or concerns. No barriers to learning was detected. I  spent 15 minutes counseling the patient face to face. The total time spent in the appointment was 20 minutes and more than 50% was on counseling and review of test results     Heath Lark, MD 09/22/2017 5:24 PM

## 2017-09-22 NOTE — Assessment & Plan Note (Signed)
Her sister noticed progressive dementia I recommend neurology consult for further review and consideration to be started on medications She agreed with the referral

## 2017-09-22 NOTE — Assessment & Plan Note (Signed)
Her cancer pain is improving with improved disease control. We will continue current pain management without changes.  I refill her prescription of pain medicine today

## 2017-10-01 ENCOUNTER — Other Ambulatory Visit: Payer: Self-pay

## 2017-10-01 MED ORDER — REVLIMID 10 MG PO CAPS: ORAL_CAPSULE | ORAL | 0 refills | Status: DC

## 2017-10-06 ENCOUNTER — Other Ambulatory Visit (HOSPITAL_BASED_OUTPATIENT_CLINIC_OR_DEPARTMENT_OTHER): Payer: Medicare HMO

## 2017-10-06 ENCOUNTER — Ambulatory Visit (HOSPITAL_BASED_OUTPATIENT_CLINIC_OR_DEPARTMENT_OTHER): Payer: Medicare HMO

## 2017-10-06 VITALS — BP 127/65 | HR 67 | Temp 98.5°F | Resp 18

## 2017-10-06 DIAGNOSIS — Z5112 Encounter for antineoplastic immunotherapy: Secondary | ICD-10-CM | POA: Diagnosis not present

## 2017-10-06 DIAGNOSIS — C9002 Multiple myeloma in relapse: Secondary | ICD-10-CM

## 2017-10-06 DIAGNOSIS — C9001 Multiple myeloma in remission: Secondary | ICD-10-CM

## 2017-10-06 LAB — CBC WITH DIFFERENTIAL/PLATELET
BASO%: 1.4 % (ref 0.0–2.0)
BASOS ABS: 0.1 10*3/uL (ref 0.0–0.1)
EOS%: 4.4 % (ref 0.0–7.0)
Eosinophils Absolute: 0.2 10*3/uL (ref 0.0–0.5)
HCT: 36.3 % (ref 34.8–46.6)
HEMOGLOBIN: 12 g/dL (ref 11.6–15.9)
LYMPH%: 27.3 % (ref 14.0–49.7)
MCH: 32 pg (ref 25.1–34.0)
MCHC: 32.9 g/dL (ref 31.5–36.0)
MCV: 97.1 fL (ref 79.5–101.0)
MONO#: 0.5 10*3/uL (ref 0.1–0.9)
MONO%: 11.2 % (ref 0.0–14.0)
NEUT#: 2.4 10*3/uL (ref 1.5–6.5)
NEUT%: 55.7 % (ref 38.4–76.8)
Platelets: 141 10*3/uL — ABNORMAL LOW (ref 145–400)
RBC: 3.74 10*6/uL (ref 3.70–5.45)
RDW: 17.2 % — ABNORMAL HIGH (ref 11.2–14.5)
WBC: 4.4 10*3/uL (ref 3.9–10.3)
lymph#: 1.2 10*3/uL (ref 0.9–3.3)

## 2017-10-06 LAB — COMPREHENSIVE METABOLIC PANEL
ALBUMIN: 3.4 g/dL — AB (ref 3.5–5.0)
ALK PHOS: 56 U/L (ref 40–150)
ALT: 23 U/L (ref 0–55)
AST: 23 U/L (ref 5–34)
Anion Gap: 9 mEq/L (ref 3–11)
BUN: 16.8 mg/dL (ref 7.0–26.0)
CHLORIDE: 108 meq/L (ref 98–109)
CO2: 26 mEq/L (ref 22–29)
Calcium: 9 mg/dL (ref 8.4–10.4)
Creatinine: 0.8 mg/dL (ref 0.6–1.1)
EGFR: >60 mL/min/{1.73_m2} (ref >60)
GLUCOSE: 81 mg/dL (ref 70–140)
POTASSIUM: 4 meq/L (ref 3.5–5.1)
SODIUM: 143 meq/L (ref 136–145)
Total Bilirubin: 0.45 mg/dL (ref 0.20–1.20)
Total Protein: 6.5 g/dL (ref 6.4–8.3)

## 2017-10-06 MED ORDER — BORTEZOMIB CHEMO SQ INJECTION 3.5 MG (2.5MG/ML)
0.9750 mg/m2 | Freq: Once | INTRAMUSCULAR | Status: AC
  Administered 2017-10-06: 1.5 mg via SUBCUTANEOUS
  Filled 2017-10-06: qty 1.5

## 2017-10-06 MED ORDER — PROCHLORPERAZINE MALEATE 10 MG PO TABS
ORAL_TABLET | ORAL | Status: AC
  Filled 2017-10-06: qty 1

## 2017-10-06 MED ORDER — PROCHLORPERAZINE MALEATE 10 MG PO TABS
10.0000 mg | ORAL_TABLET | Freq: Once | ORAL | Status: AC
Start: 2017-10-06 — End: 2017-10-06
  Administered 2017-10-06: 10 mg via ORAL

## 2017-10-06 NOTE — Patient Instructions (Signed)
[facility] Discharge Instructions for Patients Receiving Chemotherapy  Today you received the following chemotherapy agents Velcade To help prevent nausea and vomiting after your treatment, we encourage you to take your nausea medication as prescribed.   If you develop nausea and vomiting that is not controlled by your nausea medication, call the clinic.   BELOW ARE SYMPTOMS THAT SHOULD BE REPORTED IMMEDIATELY:  *FEVER GREATER THAN 100.5 F  *CHILLS WITH OR WITHOUT FEVER  NAUSEA AND VOMITING THAT IS NOT CONTROLLED WITH YOUR NAUSEA MEDICATION  *UNUSUAL SHORTNESS OF BREATH  *UNUSUAL BRUISING OR BLEEDING  TENDERNESS IN MOUTH AND THROAT WITH OR WITHOUT PRESENCE OF ULCERS  *URINARY PROBLEMS  *BOWEL PROBLEMS  UNUSUAL RASH Items with * indicate a potential emergency and should be followed up as soon as possible.  Feel free to call the clinic should you have any questions or concerns. The clinic phone number is [phone].  Please show the CHEMO ALERT CARD at check-in to the Emergency Department and triage nurse.   

## 2017-10-07 LAB — KAPPA/LAMBDA LIGHT CHAINS
IG KAPPA FREE LIGHT CHAIN: 82.7 mg/L — AB (ref 3.3–19.4)
IG LAMBDA FREE LIGHT CHAIN: 10.3 mg/L (ref 5.7–26.3)
Kappa/Lambda FluidC Ratio: 8.03 — ABNORMAL HIGH (ref 0.26–1.65)

## 2017-10-09 LAB — MULTIPLE MYELOMA PANEL, SERUM
Albumin SerPl Elph-Mcnc: 3.3 g/dL (ref 2.9–4.4)
Albumin/Glob SerPl: 1.3 (ref 0.7–1.7)
Alpha 1: 0.3 g/dL (ref 0.0–0.4)
Alpha2 Glob SerPl Elph-Mcnc: 0.9 g/dL (ref 0.4–1.0)
B-Globulin SerPl Elph-Mcnc: 1.1 g/dL (ref 0.7–1.3)
Gamma Glob SerPl Elph-Mcnc: 0.4 g/dL (ref 0.4–1.8)
Globulin, Total: 2.7 g/dL (ref 2.2–3.9)
IgA, Qn, Serum: 152 mg/dL (ref 64–422)
IgG, Qn, Serum: 517 mg/dL — ABNORMAL LOW (ref 700–1600)
IgM, Qn, Serum: 10 mg/dL — ABNORMAL LOW (ref 26–217)
M Protein SerPl Elph-Mcnc: 0.1 g/dL — ABNORMAL HIGH
Total Protein: 6 g/dL (ref 6.0–8.5)

## 2017-10-12 ENCOUNTER — Other Ambulatory Visit: Payer: Self-pay | Admitting: Hematology and Oncology

## 2017-10-12 MED FILL — LISINOPRIL 40 MG TABLET: 40 | 30 days supply | Qty: 30 | Fill #0

## 2017-10-20 ENCOUNTER — Ambulatory Visit (HOSPITAL_BASED_OUTPATIENT_CLINIC_OR_DEPARTMENT_OTHER): Payer: Medicare HMO | Admitting: Hematology and Oncology

## 2017-10-20 ENCOUNTER — Telehealth: Payer: Self-pay | Admitting: Hematology and Oncology

## 2017-10-20 ENCOUNTER — Other Ambulatory Visit (HOSPITAL_BASED_OUTPATIENT_CLINIC_OR_DEPARTMENT_OTHER): Payer: Medicare HMO

## 2017-10-20 ENCOUNTER — Encounter: Payer: Self-pay | Admitting: Hematology and Oncology

## 2017-10-20 ENCOUNTER — Ambulatory Visit (HOSPITAL_BASED_OUTPATIENT_CLINIC_OR_DEPARTMENT_OTHER): Payer: Medicare HMO

## 2017-10-20 DIAGNOSIS — C9002 Multiple myeloma in relapse: Secondary | ICD-10-CM

## 2017-10-20 DIAGNOSIS — Z5112 Encounter for antineoplastic immunotherapy: Secondary | ICD-10-CM

## 2017-10-20 DIAGNOSIS — F0390 Unspecified dementia without behavioral disturbance: Secondary | ICD-10-CM

## 2017-10-20 DIAGNOSIS — G893 Neoplasm related pain (acute) (chronic): Secondary | ICD-10-CM | POA: Diagnosis not present

## 2017-10-20 DIAGNOSIS — C9001 Multiple myeloma in remission: Secondary | ICD-10-CM

## 2017-10-20 LAB — COMPREHENSIVE METABOLIC PANEL
ALT: 17 U/L (ref 0–55)
AST: 20 U/L (ref 5–34)
Albumin: 3.4 g/dL — ABNORMAL LOW (ref 3.5–5.0)
Alkaline Phosphatase: 62 U/L (ref 40–150)
Anion Gap: 8 mEq/L (ref 3–11)
BUN: 13.2 mg/dL (ref 7.0–26.0)
CALCIUM: 8.9 mg/dL (ref 8.4–10.4)
CHLORIDE: 109 meq/L (ref 98–109)
CO2: 26 meq/L (ref 22–29)
Creatinine: 0.8 mg/dL (ref 0.6–1.1)
EGFR: >60 mL/min/{1.73_m2} (ref >60)
Glucose: 90 mg/dl (ref 70–140)
POTASSIUM: 4.1 meq/L (ref 3.5–5.1)
SODIUM: 143 meq/L (ref 136–145)
Total Bilirubin: 0.47 mg/dL (ref 0.20–1.20)
Total Protein: 6.4 g/dL (ref 6.4–8.3)

## 2017-10-20 LAB — CBC WITH DIFFERENTIAL/PLATELET
BASO%: 1.8 % (ref 0.0–2.0)
Basophils Absolute: 0.1 10*3/uL (ref 0.0–0.1)
EOS%: 2.7 % (ref 0.0–7.0)
Eosinophils Absolute: 0.1 10*3/uL (ref 0.0–0.5)
HEMATOCRIT: 37.8 % (ref 34.8–46.6)
HGB: 12.3 g/dL (ref 11.6–15.9)
LYMPH#: 1.8 10*3/uL (ref 0.9–3.3)
LYMPH%: 38 % (ref 14.0–49.7)
MCH: 31.6 pg (ref 25.1–34.0)
MCHC: 32.6 g/dL (ref 31.5–36.0)
MCV: 96.8 fL (ref 79.5–101.0)
MONO#: 0.6 10*3/uL (ref 0.1–0.9)
MONO%: 12.8 % (ref 0.0–14.0)
NEUT#: 2.1 10*3/uL (ref 1.5–6.5)
NEUT%: 44.7 % (ref 38.4–76.8)
Platelets: 202 10*3/uL (ref 145–400)
RBC: 3.91 10*6/uL (ref 3.70–5.45)
RDW: 17.1 % — AB (ref 11.2–14.5)
WBC: 4.7 10*3/uL (ref 3.9–10.3)

## 2017-10-20 MED ORDER — PROCHLORPERAZINE MALEATE 10 MG PO TABS
10.0000 mg | ORAL_TABLET | Freq: Once | ORAL | Status: AC
  Administered 2017-10-20: 10 mg via ORAL

## 2017-10-20 MED ORDER — PROCHLORPERAZINE MALEATE 10 MG PO TABS
ORAL_TABLET | ORAL | Status: AC
  Filled 2017-10-20: qty 1

## 2017-10-20 MED ORDER — BORTEZOMIB CHEMO SQ INJECTION 3.5 MG (2.5MG/ML)
0.9750 mg/m2 | Freq: Once | INTRAMUSCULAR | Status: AC
  Administered 2017-10-20: 1.5 mg via SUBCUTANEOUS
  Filled 2017-10-20: qty 1.5

## 2017-10-20 NOTE — Telephone Encounter (Signed)
Scheduled appt per 12/04 los - Gave patient AVS and calender per los.

## 2017-10-20 NOTE — Assessment & Plan Note (Signed)
With recent dose adjustment of Revlimid, she has better control of her disease Some myeloma panel showed near complete response to therapy With her progressive weakness, I recommend dexamethasone taper to 1 mg daily She will continue Velcade every other week She will continue aspirin therapy to prevent DVT She will continue calcium with vitamin D along with Zometa She will continue acyclovir for antimicrobial prophylaxis

## 2017-10-20 NOTE — Patient Instructions (Signed)
[facility] Discharge Instructions for Patients Receiving Chemotherapy  Today you received the following chemotherapy agents Velcade To help prevent nausea and vomiting after your treatment, we encourage you to take your nausea medication as prescribed.   If you develop nausea and vomiting that is not controlled by your nausea medication, call the clinic.   BELOW ARE SYMPTOMS THAT SHOULD BE REPORTED IMMEDIATELY:  *FEVER GREATER THAN 100.5 F  *CHILLS WITH OR WITHOUT FEVER  NAUSEA AND VOMITING THAT IS NOT CONTROLLED WITH YOUR NAUSEA MEDICATION  *UNUSUAL SHORTNESS OF BREATH  *UNUSUAL BRUISING OR BLEEDING  TENDERNESS IN MOUTH AND THROAT WITH OR WITHOUT PRESENCE OF ULCERS  *URINARY PROBLEMS  *BOWEL PROBLEMS  UNUSUAL RASH Items with * indicate a potential emergency and should be followed up as soon as possible.  Feel free to call the clinic should you have any questions or concerns. The clinic phone number is [phone].  Please show the CHEMO ALERT CARD at check-in to the Emergency Department and triage nurse.   

## 2017-10-20 NOTE — Assessment & Plan Note (Signed)
Her sister noticed progressive dementia I recommend neurology consult for further review and consideration to be started on medications for dementia Referral has been made, pending for next month

## 2017-10-20 NOTE — Assessment & Plan Note (Signed)
Her cancer pain is improving with improved disease control. We will continue current pain management without changes.

## 2017-10-20 NOTE — Progress Notes (Signed)
[facility] OFFICE PROGRESS NOTE  Patient Care Team: Susy Frizzle, MD as PCP - General (Family Medicine)  SUMMARY OF ONCOLOGIC HISTORY:   Multiple myeloma in relapse ([place])   01/09/2016 Imaging    MRI back showed Interval development of diffuse bone marrow infiltration by innumerable small lesions most consistent with multiple myeloma      01/23/2016 Bone Marrow Biopsy    Accession: [id number] Bone marrow biopsy showed 51% plasma cells      01/23/2016 Imaging    Skeletal survey showed lytic lesions      01/23/2016 Pathology Results    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 Revlimid was stopped in July and resumed in October      04/01/2016 Adverse Reaction    Treatment was placed on hold temporarily due to neutropenia       INTERVAL HISTORY: Please see below for problem oriented charting. She returns for further follow-up There were no reported falls since last visit She denies peripheral neuropathy We recent dexamethasone taper, her appetite remained the same and there were no reported worsening bone pain. No recent infection  REVIEW OF SYSTEMS:   Constitutional: Denies fevers, chills or abnormal weight loss Eyes: Denies blurriness of vision Ears, nose, mouth, throat, and face: Denies mucositis or sore throat Respiratory: Denies cough, dyspnea or wheezes Cardiovascular: Denies palpitation, chest discomfort or lower extremity swelling Gastrointestinal:  Denies nausea, heartburn or change in bowel habits Skin: Denies abnormal skin rashes Lymphatics: Denies new lymphadenopathy or easy bruising Neurological:Denies numbness, tingling or new weaknesses Behavioral/Psych: Mood is stable, no new changes  All other systems were reviewed with the patient and are negative.  I have reviewed the past medical history, past surgical history, social history and family history  with the patient and they are unchanged from previous note.  ALLERGIES:  is allergic to aspirin; pravastatin; and zocor [simvastatin].  MEDICATIONS:  Current Outpatient Medications  Medication Sig Dispense Refill  . acyclovir (ZOVIRAX) 400 MG tablet TAKE 1 TABLET BY MOUTH ONCE DAILY 90 tablet 3  . aspirin EC 81 MG tablet Take 81 mg by mouth daily.    . bortezomib IV (VELCADE) 3.5 MG injection Inject 1.3 mg/m2 into the vein once. Once every other week    . cephALEXin (KEFLEX) 500 MG capsule Take 1 capsule (500 mg total) by mouth 2 (two) times daily. 14 capsule 0  . Cholecalciferol (VITAMIN D PO) Take by mouth.    . dexamethasone (DECADRON) 1 MG tablet Take 1 tablet (1 mg total) daily by mouth. 90 tablet 1  . lisinopril (PRINIVIL,ZESTRIL) 40 MG tablet TAKE 1 TABLET BY MOUTH ONCE DAILY 30 tablet 11  . morphine (MS CONTIN) 15 MG 12 hr tablet Take 1 tablet (15 mg total) every 12 (twelve) hours by mouth. 60 tablet 0  . oxyCODONE-acetaminophen (ROXICET) 5-325 MG tablet Take 1 tablet by mouth every 6 (six) hours as needed for severe pain. 90 tablet 0  . prochlorperazine (COMPAZINE) 10 MG tablet Take 10 mg by mouth every 6 (six) hours as needed for nausea or vomiting.    Marland Kitchen REVLIMID 10 MG capsule Take 1 capsule by mouth every day for 21 days on, then 7 days off. 21 capsule 0   No current facility-administered medications for this visit.     PHYSICAL EXAMINATION: ECOG PERFORMANCE STATUS: 1 - Symptomatic but completely ambulatory  Vitals:  10/20/17 0931  BP: (!) 149/75  Pulse: 71  Resp: 16  Temp: 97.8 F (36.6 C)  SpO2: 99%   Filed Weights   10/20/17 0931  Weight: 143 lb 3.2 oz (65 kg)    GENERAL:alert, no distress and comfortable SKIN: skin color, texture, turgor are normal, no rashes or significant lesions EYES: normal, Conjunctiva are pink and non-injected, sclera clear OROPHARYNX:no exudate, no erythema and lips, buccal mucosa, and tongue normal  NECK: supple, thyroid normal size,  non-tender, without nodularity LYMPH:  no palpable lymphadenopathy in the cervical, axillary or inguinal LUNGS: clear to auscultation and percussion with normal breathing effort HEART: regular rate & rhythm and no murmurs and no lower extremity edema ABDOMEN:abdomen soft, non-tender and normal bowel sounds Musculoskeletal:no cyanosis of digits and no clubbing  NEURO: alert & oriented x 3 with fluent speech, no focal motor/sensory deficits  LABORATORY DATA:  I have reviewed the data as listed    Component Value Date/Time   NA 143 10/20/2017 0917   K 4.1 10/20/2017 0917   CL 107 10/06/2016 0526   CO2 26 10/20/2017 0917   GLUCOSE 90 10/20/2017 0917   BUN 13.2 10/20/2017 0917   CREATININE 0.8 10/20/2017 0917   CALCIUM 8.9 10/20/2017 0917   PROT 6.4 10/20/2017 0917   ALBUMIN 3.4 (L) 10/20/2017 0917   AST 20 10/20/2017 0917   ALT 17 10/20/2017 0917   ALKPHOS 62 10/20/2017 0917   BILITOT 0.47 10/20/2017 0917   GFRNONAA >60 10/06/2016 0526   GFRNONAA 64 01/17/2016 0910   GFRAA >60 10/06/2016 0526   GFRAA 74 01/17/2016 0910    No results found for: SPEP, UPEP  Lab Results  Component Value Date   WBC 4.7 10/20/2017   NEUTROABS 2.1 10/20/2017   HGB 12.3 10/20/2017   HCT 37.8 10/20/2017   MCV 96.8 10/20/2017   PLT 202 10/20/2017      Chemistry      Component Value Date/Time   NA 143 10/20/2017 0917   K 4.1 10/20/2017 0917   CL 107 10/06/2016 0526   CO2 26 10/20/2017 0917   BUN 13.2 10/20/2017 0917   CREATININE 0.8 10/20/2017 0917      Component Value Date/Time   CALCIUM 8.9 10/20/2017 0917   ALKPHOS 62 10/20/2017 0917   AST 20 10/20/2017 0917   ALT 17 10/20/2017 0917   BILITOT 0.47 10/20/2017 0917      ASSESSMENT & PLAN:  Multiple myeloma in relapse ([place]) With recent dose adjustment of Revlimid, she has better control of her disease Some myeloma panel showed near complete response to therapy With her progressive weakness, I recommend dexamethasone taper to 1 mg  daily She will continue Velcade every other week She will continue aspirin therapy to prevent DVT She will continue calcium with vitamin D along with Zometa She will continue acyclovir for antimicrobial prophylaxis  Cancer associated pain Her cancer pain is improving with improved disease control. We will continue current pain management without changes.   Dementia Her sister noticed progressive dementia I recommend neurology consult for further review and consideration to be started on medications for dementia Referral has been made, pending for next month   No orders of the defined types were placed in this encounter.  All questions were answered. The patient knows to call the clinic with any problems, questions or concerns. No barriers to learning was detected. I spent 15 minutes counseling the patient face to face. The total time spent in the appointment was 20 minutes  and more than 50% was on counseling and review of test results     Heath Lark, MD 10/20/2017 9:56 AM

## 2017-10-27 MED FILL — ACYCLOVIR 400 MG TABLET: 400 | 90 days supply | Qty: 90 | Fill #1

## 2017-10-29 ENCOUNTER — Telehealth: Payer: Self-pay

## 2017-10-29 ENCOUNTER — Encounter (HOSPITAL_COMMUNITY): Payer: Self-pay | Admitting: Emergency Medicine

## 2017-10-29 ENCOUNTER — Emergency Department (HOSPITAL_COMMUNITY)
Admission: EM | Admit: 2017-10-29 | Discharge: 2017-10-29 | Disposition: A | Discharge status: Home / Self Care | Payer: Medicare HMO | Attending: Emergency Medicine | Admitting: Emergency Medicine

## 2017-10-29 DIAGNOSIS — R197 Diarrhea, unspecified: Secondary | ICD-10-CM | POA: Diagnosis not present

## 2017-10-29 DIAGNOSIS — C9000 Multiple myeloma not having achieved remission: Secondary | ICD-10-CM | POA: Insufficient documentation

## 2017-10-29 DIAGNOSIS — Z79899 Other long term (current) drug therapy: Secondary | ICD-10-CM | POA: Insufficient documentation

## 2017-10-29 DIAGNOSIS — R5383 Other fatigue: Secondary | ICD-10-CM | POA: Insufficient documentation

## 2017-10-29 DIAGNOSIS — N390 Urinary tract infection, site not specified: Secondary | ICD-10-CM | POA: Insufficient documentation

## 2017-10-29 DIAGNOSIS — Z7982 Long term (current) use of aspirin: Secondary | ICD-10-CM | POA: Insufficient documentation

## 2017-10-29 DIAGNOSIS — I10 Essential (primary) hypertension: Secondary | ICD-10-CM | POA: Diagnosis not present

## 2017-10-29 DIAGNOSIS — F0390 Unspecified dementia without behavioral disturbance: Secondary | ICD-10-CM | POA: Diagnosis not present

## 2017-10-29 LAB — CBC WITH DIFFERENTIAL/PLATELET
Basophils Absolute: 0 10*3/uL (ref 0.0–0.1)
Basophils Relative: 1 %
Eosinophils Absolute: 0.2 10*3/uL (ref 0.0–0.7)
Eosinophils Relative: 3 %
HEMATOCRIT: 39.6 % (ref 36.0–46.0)
HEMOGLOBIN: 12.9 g/dL (ref 12.0–15.0)
LYMPHS ABS: 1.2 10*3/uL (ref 0.7–4.0)
Lymphocytes Relative: 17 %
MCH: 31.3 pg (ref 26.0–34.0)
MCHC: 32.6 g/dL (ref 30.0–36.0)
MCV: 96.1 fL (ref 78.0–100.0)
MONO ABS: 0.5 10*3/uL (ref 0.1–1.0)
MONOS PCT: 7 %
NEUTROS ABS: 5.1 10*3/uL (ref 1.7–7.7)
NEUTROS PCT: 72 %
Platelets: 193 10*3/uL (ref 150–400)
RBC: 4.12 MIL/uL (ref 3.87–5.11)
RDW: 15.1 % (ref 11.5–15.5)
WBC: 7.1 10*3/uL (ref 4.0–10.5)

## 2017-10-29 LAB — COMPREHENSIVE METABOLIC PANEL
ALT: 17 U/L (ref 14–54)
AST: 34 U/L (ref 15–41)
Albumin: 3.7 g/dL (ref 3.5–5.0)
Alkaline Phosphatase: 77 U/L (ref 38–126)
Anion gap: 9 (ref 5–15)
BUN: 16 mg/dL (ref 6–20)
CO2: 23 mmol/L (ref 22–32)
Calcium: 8.7 mg/dL — ABNORMAL LOW (ref 8.9–10.3)
Chloride: 107 mmol/L (ref 101–111)
Creatinine, Ser: 0.94 mg/dL (ref 0.44–1.00)
GFR calc Af Amer: >60 mL/min (ref >60)
GFR calc non Af Amer: 56 mL/min — ABNORMAL LOW (ref >60)
Glucose, Bld: 108 mg/dL — ABNORMAL HIGH (ref 65–99)
Potassium: 4.2 mmol/L (ref 3.5–5.1)
Sodium: 139 mmol/L (ref 135–145)
Total Bilirubin: 1.4 mg/dL — ABNORMAL HIGH (ref 0.3–1.2)
Total Protein: 7 g/dL (ref 6.5–8.1)

## 2017-10-29 LAB — URINALYSIS, ROUTINE W REFLEX MICROSCOPIC
Bacteria, UA: NONE SEEN
Bilirubin Urine: NEGATIVE
Glucose, UA: NEGATIVE mg/dL
Hgb urine dipstick: NEGATIVE
Ketones, ur: 5 mg/dL — AB
Nitrite: NEGATIVE
Protein, ur: NEGATIVE mg/dL
Specific Gravity, Urine: 1.025 (ref 1.005–1.030)
pH: 5 (ref 5.0–8.0)

## 2017-10-29 LAB — LIPASE, BLOOD: Lipase: 21 U/L (ref 11–51)

## 2017-10-29 MED ORDER — SODIUM CHLORIDE 0.9 % IV BOLUS (SEPSIS)
500.0000 mL | Freq: Once | INTRAVENOUS | Status: AC
  Administered 2017-10-29: 500 mL via INTRAVENOUS

## 2017-10-29 MED ORDER — CEPHALEXIN 500 MG PO CAPS: 500.0000 mg | ORAL_CAPSULE | Freq: Two times a day (BID) | ORAL | 0 refills | Status: AC

## 2017-10-29 MED ORDER — SODIUM CHLORIDE 0.9 % IV BOLUS (SEPSIS)
1000.0000 mL | Freq: Once | INTRAVENOUS | Status: AC
  Administered 2017-10-29: 1000 mL via INTRAVENOUS

## 2017-10-29 MED ORDER — CEPHALEXIN 500 MG PO CAPS
500.0000 mg | ORAL_CAPSULE | Freq: Once | ORAL | Status: AC
  Administered 2017-10-29: 500 mg via ORAL
  Filled 2017-10-29: qty 1

## 2017-10-29 MED ORDER — LOPERAMIDE HCL 2 MG PO CAPS: 2.0000 mg | ORAL_CAPSULE | Freq: Four times a day (QID) | ORAL | 0 refills | Status: DC | PRN

## 2017-10-29 MED ORDER — SODIUM CHLORIDE 0.9 % IV BOLUS (SEPSIS): 1000.0000 mL | Freq: Once | INTRAVENOUS | Status: DC

## 2017-10-29 NOTE — Telephone Encounter (Signed)
Sister called. Patient has had diarrhea all week, today it has been real bad. She also has hemorrhoids. She has not eaten all day. She drank one ensure today. She is starting to feel weak and spending 45 minutes in bathroom with each visit. Instructed sister to take her to ER to be evaluated, Verbalized understanding.

## 2017-10-29 NOTE — ED Triage Notes (Signed)
Patient been having diarrhea and fatigue since last week.  Patient had chemo last Tuesday for multiple myeloma.

## 2017-10-29 NOTE — ED Provider Notes (Signed)
[facility] DEPT Provider Note   CSN: [id number] Arrival date & time: 10/29/17  1719     History   Chief Complaint Chief Complaint  Patient presents with  . Diarrhea  . Fatigue    HPI Kimberly Friedman is a 81 y.o. female.  HPI  81 y.o. female with a hx of DDD, HTN, Multiple Myeloma, presents to the Emergency Department today due to diarrhea and fatigue since last week. Pt notes one prolonged movement this afternoon. Last one yesterday. Seems to be one big bout daily. No recent ABX usage. No N/V. Denies abdominal pain. No CP/SOB. No fevers. No cough/congestion. Pt currently on Revlimid and then Velcade every other week for Multiple Myeloma. Denies sick contacts. No other symptoms noted    Past Medical History:  Diagnosis Date  . DDD (degenerative disc disease), lumbar   . Hypertension   . Multiple myeloma ([place]) 01/21/2016  . PMR (polymyalgia rheumatica) [facility])     Patient Active Problem List   Diagnosis Date Noted  . Dementia 09/22/2017  . Fall at home, initial encounter 09/08/2017  . Epistaxis 03/31/2017  . Viral illness 11/13/2016  . Goals of care, counseling/discussion 08/19/2016  . Peripheral edema 08/06/2016  . Bilateral leg edema 04/08/2016  . Drug-induced neutropenia ([place]) 04/08/2016  . Memory deficits 04/08/2016  . Anemia due to antineoplastic chemotherapy 02/19/2016  . Protein-calorie malnutrition, mild ([place]) 02/19/2016  . Hypotension due to drugs 01/22/2016  . Cancer associated pain 01/22/2016  . Malignant cachexia ([place]) 01/22/2016  . Acquired pancytopenia ([place]) 01/22/2016  . Multiple myeloma in relapse ([place]) 01/21/2016  . PMR (polymyalgia rheumatica) ([facility])   . DDD (degenerative disc disease), lumbar   . Essential hypertension   . Bilateral leg weakness 01/25/2013  . Difficulty in walking(719.7) 01/25/2013    Past Surgical History:  Procedure Laterality Date  . HEMORRHOID SURGERY      OB History    No data available         Home Medications    Prior to Admission medications   Medication Sig Start Date End Date Taking? Authorizing Provider  acyclovir (ZOVIRAX) 400 MG tablet TAKE 1 TABLET BY MOUTH ONCE DAILY 07/22/17   Heath Lark, MD  aspirin EC 81 MG tablet Take 81 mg by mouth daily.    [provider]  bortezomib IV (VELCADE) 3.5 MG injection Inject 1.3 mg/m2 into the vein once. Once every other week    [provider]  cephALEXin (KEFLEX) 500 MG capsule Take 1 capsule (500 mg total) by mouth 2 (two) times daily. 07/15/17   Alycia Rossetti, MD  Cholecalciferol (VITAMIN D PO) Take by mouth.    [provider]  dexamethasone (DECADRON) 1 MG tablet Take 1 tablet (1 mg total) daily by mouth. 09/22/17   Heath Lark, MD  lisinopril (PRINIVIL,ZESTRIL) 40 MG tablet TAKE 1 TABLET BY MOUTH ONCE DAILY 10/12/17   Heath Lark, MD  morphine (MS CONTIN) 15 MG 12 hr tablet Take 1 tablet (15 mg total) every 12 (twelve) hours by mouth. 09/22/17   Heath Lark, MD  oxyCODONE-acetaminophen (ROXICET) 5-325 MG tablet Take 1 tablet by mouth every 6 (six) hours as needed for severe pain. 09/08/17   Heath Lark, MD  prochlorperazine (COMPAZINE) 10 MG tablet Take 10 mg by mouth every 6 (six) hours as needed for nausea or vomiting.    [provider]  REVLIMID 10 MG capsule Take 1 capsule by mouth every day for 21 days on, then 7  days off. 10/01/17   Heath Lark, MD    Family History Family History  Problem Relation Age of Onset  . Cancer Mother        lung ca  . Cancer Father        prostate ca  . Cancer Maternal Aunt        colon ca    Social History Social History   Tobacco Use  . Smoking status: Never Smoker  . Smokeless tobacco: Never Used  Substance Use Topics  . Alcohol use: No  . Drug use: No     Allergies   Aspirin; Pravastatin; and Zocor [simvastatin]   Review of Systems Review of Systems ROS reviewed and all are negative for acute change except as noted in the  HPI.  Physical Exam Updated Vital Signs BP 111/67 (BP Location: Right Arm)   Pulse (!) 114   Temp 99.1 F (37.3 C) (Oral)   Resp 20   SpO2 95%   Physical Exam  Constitutional: She is oriented to person, place, and time. She appears well-developed and well-nourished. No distress.  HENT:  Head: Normocephalic and atraumatic.  Right Ear: Tympanic membrane, external ear and ear canal normal.  Left Ear: Tympanic membrane, external ear and ear canal normal.  Nose: Nose normal.  Mouth/Throat: Uvula is midline, oropharynx is clear and moist and mucous membranes are normal. No trismus in the jaw. No oropharyngeal exudate, posterior oropharyngeal erythema or tonsillar abscesses.  Eyes: EOM are normal. Pupils are equal, round, and reactive to light.  Neck: Normal range of motion. Neck supple. No tracheal deviation present.  Cardiovascular: Normal rate, regular rhythm, S1 normal, S2 normal, normal heart sounds, intact distal pulses and normal pulses.  Pulmonary/Chest: Effort normal and breath sounds normal. No respiratory distress. She has no decreased breath sounds. She has no wheezes. She has no rhonchi. She has no rales.  Abdominal: Normal appearance and bowel sounds are normal. There is no tenderness.  Musculoskeletal: Normal range of motion.  Neurological: She is alert and oriented to person, place, and time.  Skin: Skin is warm and dry.  Psychiatric: She has a normal mood and affect. Her speech is normal and behavior is normal. Thought content normal.  Nursing note and vitals reviewed.  ED Treatments / Results  Labs (all labs ordered are listed, but only abnormal results are displayed) Labs Reviewed  COMPREHENSIVE METABOLIC PANEL - Abnormal; Notable for the following components:      Result Value   Glucose, Bld 108 (*)    Calcium 8.7 (*)    Total Bilirubin 1.4 (*)    GFR calc non Af Amer 56 (*)    All other components within normal limits  URINALYSIS, ROUTINE W REFLEX MICROSCOPIC -  Abnormal; Notable for the following components:   Color, Urine AMBER (*)    Ketones, ur 5 (*)    Leukocytes, UA LARGE (*)    Squamous Epithelial / LPF 0-5 (*)    All other components within normal limits  LIPASE, BLOOD  CBC WITH DIFFERENTIAL/PLATELET    EKG  EKG Interpretation None       Radiology No results found.  Procedures Procedures (including critical care time)  Medications Ordered in ED Medications  sodium chloride 0.9 % bolus 1,000 mL (0 mLs Intravenous Stopped 10/29/17 1943)  cephALEXin (KEFLEX) capsule 500 mg (500 mg Oral Given 10/29/17 2059)  sodium chloride 0.9 % bolus 500 mL (500 mLs Intravenous New Bag/Given 10/29/17 2100)   Initial Impression / Assessment  and Plan / ED Course  I have reviewed the triage vital signs and the nursing notes.  Pertinent labs & imaging results that were available during my care of the patient were reviewed by me and considered in my medical decision making (see chart for details).  Final Clinical Impressions(s) / ED Diagnoses  {I have reviewed and evaluated the relevant laboratory values.   {I have reviewed the relevant previous healthcare records.  {I obtained HPI from historian. {Patient discussed with supervising physician.  ED Course:  Assessment: Pt is a 81 y.o. female with a hx of DDD, HTN, Multiple Myeloma, presents to the Emergency Department today due to diarrhea and fatigue since last week. Pt notes one prolonged movement this afternoon. Last one yesterday. Seems to be one big bout daily. No recent ABX usage. No N/V. Denies abdominal pain. No CP/SOB. No fevers. No cough/congestion. Pt currently on Revlimid and then Velcade every other week for Multiple Myeloma. Denies sick contacts. On exam, pt in NAD. Nontoxic/nonseptic appearing. VSS. Afebrile. Lungs CTA. Heart RRR. Abdomen nontender soft. CBC unremarkable. CMP unremarkable. Lipase negative. Given fluids in ED. UA shows evidence of UTI. Culture sent, Seen by attending  physician. Given ABX. Plan is to DC home with ABX as well as imodium for diarrhea. At time of discharge, Patient is in no acute distress. Vital Signs are stable. Patient is able to ambulate. Patient able to tolerate PO.   Disposition/Plan:  DC Home Additional Verbal discharge instructions given and discussed with patient.  Pt Instructed to f/u with PCP in the next week for evaluation and treatment of symptoms. Return precautions given Pt acknowledges and agrees with plan  Supervising Physician Virgel Manifold, MD  Final diagnoses:  Urinary tract infection without hematuria, site unspecified  Diarrhea, unspecified type    ED Discharge Orders    None       Shary Decamp, PA-C 10/29/17 2131    Virgel Manifold, MD 11/03/17 1123

## 2017-10-29 NOTE — Discharge Instructions (Signed)
Please read and follow all provided instructions.  Your diagnoses today include:  1. Urinary tract infection without hematuria, site unspecified   2. Diarrhea, unspecified type     Tests performed today include: Vital signs. See below for your results today.   Medications prescribed:  Take as prescribed   Home care instructions:  Follow any educational materials contained in this packet.  Follow-up instructions: Please follow-up with your primary care provider for further evaluation of symptoms and treatment   Return instructions:  Please return to the Emergency Department if you do not get better, if you get worse, or new symptoms OR  - Fever (temperature greater than 101.64F)  - Bleeding that does not stop with holding pressure to the area    -Severe pain (please note that you may be more sore the day after your accident)  - Chest Pain  - Difficulty breathing  - Severe nausea or vomiting  - Inability to tolerate food and liquids  - Passing out  - Skin becoming red around your wounds  - Change in mental status (confusion or lethargy)  - New numbness or weakness    Please return if you have any other emergent concerns.  Additional Information:  Your vital signs today were: BP (!) 104/52 (BP Location: Left Arm)    Pulse 71    Temp 98.4 F (36.9 C) (Oral)    Resp 16    SpO2 92%  If your blood pressure (BP) was elevated above 135/85 this visit, please have this repeated by your doctor within one month. ---------------

## 2017-10-30 MED FILL — CEPHALEXIN 500 MG CAPSULE: 500 | 7 days supply | Qty: 14 | Fill #0

## 2017-10-31 LAB — URINE CULTURE

## 2017-11-03 ENCOUNTER — Ambulatory Visit (HOSPITAL_BASED_OUTPATIENT_CLINIC_OR_DEPARTMENT_OTHER): Payer: Medicare HMO

## 2017-11-03 ENCOUNTER — Other Ambulatory Visit (HOSPITAL_BASED_OUTPATIENT_CLINIC_OR_DEPARTMENT_OTHER): Payer: Medicare HMO

## 2017-11-03 VITALS — BP 97/50 | HR 77 | Temp 98.2°F | Resp 18

## 2017-11-03 DIAGNOSIS — C9002 Multiple myeloma in relapse: Secondary | ICD-10-CM

## 2017-11-03 DIAGNOSIS — C9001 Multiple myeloma in remission: Secondary | ICD-10-CM | POA: Diagnosis not present

## 2017-11-03 DIAGNOSIS — Z5112 Encounter for antineoplastic immunotherapy: Secondary | ICD-10-CM

## 2017-11-03 LAB — COMPREHENSIVE METABOLIC PANEL
ALBUMIN: 3.2 g/dL — AB (ref 3.5–5.0)
ALK PHOS: 84 U/L (ref 40–150)
ALT: 18 U/L (ref 0–55)
AST: 20 U/L (ref 5–34)
Anion Gap: 12 mEq/L — ABNORMAL HIGH (ref 3–11)
BILIRUBIN TOTAL: 0.64 mg/dL (ref 0.20–1.20)
BUN: 13.4 mg/dL (ref 7.0–26.0)
CO2: 27 mEq/L (ref 22–29)
CREATININE: 0.9 mg/dL (ref 0.6–1.1)
Calcium: 9.4 mg/dL (ref 8.4–10.4)
Chloride: 106 mEq/L (ref 98–109)
EGFR: >60 mL/min/{1.73_m2} (ref >60)
GLUCOSE: 112 mg/dL (ref 70–140)
Potassium: 3.6 mEq/L (ref 3.5–5.1)
SODIUM: 145 meq/L (ref 136–145)
TOTAL PROTEIN: 6.9 g/dL (ref 6.4–8.3)

## 2017-11-03 LAB — CBC WITH DIFFERENTIAL/PLATELET
BASO%: 0.5 % (ref 0.0–2.0)
Basophils Absolute: 0 10*3/uL (ref 0.0–0.1)
EOS%: 7.8 % — AB (ref 0.0–7.0)
Eosinophils Absolute: 0.5 10*3/uL (ref 0.0–0.5)
HCT: 39 % (ref 34.8–46.6)
HGB: 12.5 g/dL (ref 11.6–15.9)
LYMPH%: 25.2 % (ref 14.0–49.7)
MCH: 31.2 pg (ref 25.1–34.0)
MCHC: 32.1 g/dL (ref 31.5–36.0)
MCV: 97.3 fL (ref 79.5–101.0)
MONO#: 0.4 10*3/uL (ref 0.1–0.9)
MONO%: 6.5 % (ref 0.0–14.0)
NEUT%: 60 % (ref 38.4–76.8)
NEUTROS ABS: 3.8 10*3/uL (ref 1.5–6.5)
Platelets: 211 10*3/uL (ref 145–400)
RBC: 4.01 10*6/uL (ref 3.70–5.45)
RDW: 15.2 % — AB (ref 11.2–14.5)
WBC: 6.3 10*3/uL (ref 3.9–10.3)
lymph#: 1.6 10*3/uL (ref 0.9–3.3)

## 2017-11-03 MED ORDER — PROCHLORPERAZINE MALEATE 10 MG PO TABS
ORAL_TABLET | ORAL | Status: AC
  Filled 2017-11-03: qty 1

## 2017-11-03 MED ORDER — PROCHLORPERAZINE MALEATE 10 MG PO TABS
10.0000 mg | ORAL_TABLET | Freq: Once | ORAL | Status: AC
  Administered 2017-11-03: 10 mg via ORAL

## 2017-11-03 MED ORDER — BORTEZOMIB CHEMO SQ INJECTION 3.5 MG (2.5MG/ML)
0.9750 mg/m2 | Freq: Once | INTRAMUSCULAR | Status: AC
  Administered 2017-11-03: 1.5 mg via SUBCUTANEOUS
  Filled 2017-11-03: qty 1.5

## 2017-11-03 NOTE — Patient Instructions (Signed)
[facility] Discharge Instructions for Patients Receiving Chemotherapy  Today you received the following chemotherapy agents Velcade.  To help prevent nausea and vomiting after your treatment, we encourage you to take your nausea medication as directed.  If you develop nausea and vomiting that is not controlled by your nausea medication, call the clinic.   BELOW ARE SYMPTOMS THAT SHOULD BE REPORTED IMMEDIATELY:  *FEVER GREATER THAN 100.5 F  *CHILLS WITH OR WITHOUT FEVER  NAUSEA AND VOMITING THAT IS NOT CONTROLLED WITH YOUR NAUSEA MEDICATION  *UNUSUAL SHORTNESS OF BREATH  *UNUSUAL BRUISING OR BLEEDING  TENDERNESS IN MOUTH AND THROAT WITH OR WITHOUT PRESENCE OF ULCERS  *URINARY PROBLEMS  *BOWEL PROBLEMS  UNUSUAL RASH Items with * indicate a potential emergency and should be followed up as soon as possible.  Feel free to call the clinic should you have any questions or concerns. The clinic phone number is [phone].  Please show the CHEMO ALERT CARD at check-in to the Emergency Department and triage nurse.   

## 2017-11-03 NOTE — Progress Notes (Signed)
Instructed sister per Dr. Alvy Bimler to stop Lisinopril for x 1 week, then to resume at 1/2 dose until seen by Dr. Alvy Bimler. Verbalized understanding.

## 2017-11-04 LAB — KAPPA/LAMBDA LIGHT CHAINS
Ig Kappa Free Light Chain: 146.5 mg/L — ABNORMAL HIGH (ref 3.3–19.4)
Ig Lambda Free Light Chain: 14.5 mg/L (ref 5.7–26.3)
Kappa/Lambda FluidC Ratio: 10.1 — ABNORMAL HIGH (ref 0.26–1.65)

## 2017-11-06 ENCOUNTER — Other Ambulatory Visit: Payer: Self-pay

## 2017-11-06 LAB — MULTIPLE MYELOMA PANEL, SERUM
ALPHA2 GLOB SERPL ELPH-MCNC: 1.1 g/dL — AB (ref 0.4–1.0)
Albumin SerPl Elph-Mcnc: 3.1 g/dL (ref 2.9–4.4)
Albumin/Glob SerPl: 1.1 (ref 0.7–1.7)
Alpha 1: 0.4 g/dL (ref 0.0–0.4)
B-GLOBULIN SERPL ELPH-MCNC: 1.2 g/dL (ref 0.7–1.3)
GLOBULIN, TOTAL: 3.1 g/dL (ref 2.2–3.9)
Gamma Glob SerPl Elph-Mcnc: 0.5 g/dL (ref 0.4–1.8)
IGG (IMMUNOGLOBIN G), SERUM: 558 mg/dL — AB (ref 700–1600)
IgA, Qn, Serum: 179 mg/dL (ref 64–422)
IgM, Qn, Serum: 13 mg/dL — ABNORMAL LOW (ref 26–217)
M PROTEIN SERPL ELPH-MCNC: 0.3 g/dL — AB
TOTAL PROTEIN: 6.2 g/dL (ref 6.0–8.5)

## 2017-11-06 MED ORDER — REVLIMID 10 MG PO CAPS: ORAL_CAPSULE | ORAL | 0 refills | Status: DC

## 2017-11-16 MED FILL — LISINOPRIL 40 MG TABLET: 40 | 30 days supply | Qty: 30 | Fill #1

## 2017-11-18 ENCOUNTER — Other Ambulatory Visit: Payer: Self-pay | Admitting: *Deleted

## 2017-11-18 DIAGNOSIS — M48061 Spinal stenosis, lumbar region without neurogenic claudication: Secondary | ICD-10-CM

## 2017-11-18 MED ORDER — OXYCODONE-ACETAMINOPHEN 5-325 MG PO TABS: 1.0000 | ORAL_TABLET | Freq: Four times a day (QID) | ORAL | 0 refills | Status: DC | PRN

## 2017-11-18 MED ORDER — DEXAMETHASONE 1 MG PO TABS: 1.0000 mg | ORAL_TABLET | Freq: Every day | ORAL | 1 refills | Status: DC

## 2017-11-18 MED FILL — OXYCOD/ACETAMINOPHEN 5-325M: 5-325 | 22 days supply | Qty: 90 | Fill #0

## 2017-11-18 MED FILL — DEXAMETHASONE 1 MG TABLET: 1 | 90 days supply | Qty: 90 | Fill #0

## 2017-11-24 ENCOUNTER — Inpatient Hospital Stay: Payer: Medicare HMO | Attending: Hematology and Oncology

## 2017-11-24 ENCOUNTER — Telehealth: Payer: Self-pay | Admitting: Hematology and Oncology

## 2017-11-24 ENCOUNTER — Inpatient Hospital Stay: Payer: Medicare HMO

## 2017-11-24 ENCOUNTER — Inpatient Hospital Stay (HOSPITAL_BASED_OUTPATIENT_CLINIC_OR_DEPARTMENT_OTHER): Payer: Medicare HMO | Admitting: Hematology and Oncology

## 2017-11-24 DIAGNOSIS — C9000 Multiple myeloma not having achieved remission: Secondary | ICD-10-CM | POA: Insufficient documentation

## 2017-11-24 DIAGNOSIS — N920 Excessive and frequent menstruation with regular cycle: Secondary | ICD-10-CM | POA: Insufficient documentation

## 2017-11-24 DIAGNOSIS — Z9221 Personal history of antineoplastic chemotherapy: Secondary | ICD-10-CM | POA: Diagnosis not present

## 2017-11-24 DIAGNOSIS — C9002 Multiple myeloma in relapse: Secondary | ICD-10-CM

## 2017-11-24 DIAGNOSIS — F0390 Unspecified dementia without behavioral disturbance: Secondary | ICD-10-CM

## 2017-11-24 DIAGNOSIS — Z9181 History of falling: Secondary | ICD-10-CM | POA: Diagnosis not present

## 2017-11-24 DIAGNOSIS — R197 Diarrhea, unspecified: Secondary | ICD-10-CM | POA: Diagnosis not present

## 2017-11-24 DIAGNOSIS — Z5111 Encounter for antineoplastic chemotherapy: Secondary | ICD-10-CM | POA: Diagnosis not present

## 2017-11-24 DIAGNOSIS — R64 Cachexia: Secondary | ICD-10-CM | POA: Diagnosis not present

## 2017-11-24 DIAGNOSIS — R531 Weakness: Secondary | ICD-10-CM | POA: Insufficient documentation

## 2017-11-24 DIAGNOSIS — Z7982 Long term (current) use of aspirin: Secondary | ICD-10-CM | POA: Diagnosis not present

## 2017-11-24 DIAGNOSIS — G893 Neoplasm related pain (acute) (chronic): Secondary | ICD-10-CM | POA: Insufficient documentation

## 2017-11-24 DIAGNOSIS — D61818 Other pancytopenia: Secondary | ICD-10-CM | POA: Diagnosis not present

## 2017-11-24 DIAGNOSIS — Z79899 Other long term (current) drug therapy: Secondary | ICD-10-CM | POA: Diagnosis not present

## 2017-11-24 DIAGNOSIS — C9001 Multiple myeloma in remission: Secondary | ICD-10-CM

## 2017-11-24 LAB — COMPREHENSIVE METABOLIC PANEL
ALBUMIN: 3.2 g/dL — AB (ref 3.5–5.0)
ALK PHOS: 65 U/L (ref 40–150)
ALT: 16 U/L (ref 0–55)
AST: 16 U/L (ref 5–34)
Anion gap: 8 (ref 3–11)
BILIRUBIN TOTAL: 0.6 mg/dL (ref 0.2–1.2)
BUN: 18 mg/dL (ref 7–26)
CO2: 28 mmol/L (ref 22–29)
Calcium: 9 mg/dL (ref 8.4–10.4)
Chloride: 107 mmol/L (ref 98–109)
Creatinine, Ser: 0.87 mg/dL (ref 0.60–1.10)
GFR calc Af Amer: >60 mL/min (ref >60)
GFR calc non Af Amer: >60 mL/min (ref >60)
GLUCOSE: 83 mg/dL (ref 70–140)
POTASSIUM: 3.7 mmol/L (ref 3.3–4.7)
Sodium: 143 mmol/L (ref 136–145)
TOTAL PROTEIN: 6.2 g/dL — AB (ref 6.4–8.3)

## 2017-11-24 LAB — CBC WITH DIFFERENTIAL/PLATELET
ABS GRANULOCYTE: 3.1 10*3/uL (ref 1.5–6.5)
BASOS PCT: 1 %
Basophils Absolute: 0.1 10*3/uL (ref 0.0–0.1)
Eosinophils Absolute: 0.4 10*3/uL (ref 0.0–0.5)
Eosinophils Relative: 8 %
HEMATOCRIT: 36.2 % (ref 34.8–46.6)
HEMOGLOBIN: 11.8 g/dL (ref 11.6–15.9)
LYMPHS ABS: 1.1 10*3/uL (ref 0.9–3.3)
Lymphocytes Relative: 22 %
MCH: 30.6 pg (ref 25.1–34.0)
MCHC: 32.5 g/dL (ref 31.5–36.0)
MCV: 94.3 fL (ref 79.5–101.0)
MONO ABS: 0.3 10*3/uL (ref 0.1–0.9)
MONOS PCT: 7 %
NEUTROS ABS: 3.1 10*3/uL (ref 1.5–6.5)
Neutrophils Relative %: 62 %
Platelets: 131 10*3/uL — ABNORMAL LOW (ref 145–400)
RBC: 3.84 MIL/uL (ref 3.70–5.45)
RDW: 16.7 % — AB (ref 11.2–16.1)
WBC: 4.9 10*3/uL (ref 3.9–10.3)

## 2017-11-24 MED ORDER — BORTEZOMIB CHEMO SQ INJECTION 3.5 MG (2.5MG/ML)
0.9750 mg/m2 | Freq: Once | INTRAMUSCULAR | Status: AC
  Administered 2017-11-24: 1.5 mg via SUBCUTANEOUS
  Filled 2017-11-24: qty 1.5

## 2017-11-24 MED ORDER — PROCHLORPERAZINE MALEATE 10 MG PO TABS
ORAL_TABLET | ORAL | Status: AC
  Filled 2017-11-24: qty 1

## 2017-11-24 MED ORDER — PROCHLORPERAZINE MALEATE 10 MG PO TABS
10.0000 mg | ORAL_TABLET | Freq: Once | ORAL | Status: AC
  Administered 2017-11-24: 10 mg via ORAL

## 2017-11-24 NOTE — Telephone Encounter (Signed)
Gave patient avs and calendar with appts per 1/8 los.  °

## 2017-11-24 NOTE — Patient Instructions (Signed)
[facility] Discharge Instructions for Patients Receiving Chemotherapy  Today you received the following chemotherapy agents: Bortezomib (Velcade)  To help prevent nausea and vomiting after your treatment, we encourage you to take your nausea medication  as prescribed.    If you develop nausea and vomiting that is not controlled by your nausea medication, call the clinic.   BELOW ARE SYMPTOMS THAT SHOULD BE REPORTED IMMEDIATELY:  *FEVER GREATER THAN 100.5 F  *CHILLS WITH OR WITHOUT FEVER  NAUSEA AND VOMITING THAT IS NOT CONTROLLED WITH YOUR NAUSEA MEDICATION  *UNUSUAL SHORTNESS OF BREATH  *UNUSUAL BRUISING OR BLEEDING  TENDERNESS IN MOUTH AND THROAT WITH OR WITHOUT PRESENCE OF ULCERS  *URINARY PROBLEMS  *BOWEL PROBLEMS  UNUSUAL RASH Items with * indicate a potential emergency and should be followed up as soon as possible.  Feel free to call the clinic should you have any questions or concerns. The clinic phone number is [phone].  Please show the CHEMO ALERT CARD at check-in to the Emergency Department and triage nurse.   

## 2017-11-26 ENCOUNTER — Encounter: Payer: Self-pay | Admitting: Hematology and Oncology

## 2017-11-26 ENCOUNTER — Telehealth: Payer: Self-pay

## 2017-11-26 NOTE — Progress Notes (Signed)
[facility] OFFICE PROGRESS NOTE  Patient Care Team: Susy Frizzle, MD as PCP - General (Family Medicine)  SUMMARY OF ONCOLOGIC HISTORY:   Multiple myeloma in relapse ([place])   01/09/2016 Imaging    MRI back showed Interval development of diffuse bone marrow infiltration by innumerable small lesions most consistent with multiple myeloma      01/23/2016 Bone Marrow Biopsy    Accession: [id number] Bone marrow biopsy showed 51% plasma cells      01/23/2016 Imaging    Skeletal survey showed lytic lesions      01/23/2016 Pathology Results    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 Revlimid was stopped in July and resumed in October      04/01/2016 Adverse Reaction    Treatment was placed on hold temporarily due to neutropenia       INTERVAL HISTORY: Please see below for problem oriented charting. She returns for further follow-up with her sisters The patient had recent diarrhea and urinary tract infection She has progressive decline in performance status and continues to be weak No recent falls Family members did not notice any recent fever or chills Patient stated her bone pain is stable She denies peripheral neuropathy She is compliant taking all her medications as directed  REVIEW OF SYSTEMS:   Constitutional: Denies fevers, chills or abnormal weight loss Eyes: Denies blurriness of vision Ears, nose, mouth, throat, and face: Denies mucositis or sore throat Respiratory: Denies cough, dyspnea or wheezes Cardiovascular: Denies palpitation, chest discomfort or lower extremity swelling Skin: Denies abnormal skin rashes Lymphatics: Denies new lymphadenopathy or easy bruising Neurological:Denies numbness, tingling or new weaknesses Behavioral/Psych: Mood is stable, no new changes  All other systems were reviewed with the patient and are negative.  I have reviewed the  past medical history, past surgical history, social history and family history with the patient and they are unchanged from previous note.  ALLERGIES:  is allergic to aspirin; pravastatin; and zocor [simvastatin].  MEDICATIONS:  Current Outpatient Medications  Medication Sig Dispense Refill  . acyclovir (ZOVIRAX) 400 MG tablet TAKE 1 TABLET BY MOUTH ONCE DAILY 90 tablet 3  . aspirin EC 81 MG tablet Take 81 mg by mouth daily.    . bortezomib IV (VELCADE) 3.5 MG injection Inject 1.3 mg/m2 into the vein once. Once every other week    . Cholecalciferol (VITAMIN D PO) Take by mouth.    . dexamethasone (DECADRON) 1 MG tablet Take 1 tablet (1 mg total) by mouth daily. 90 tablet 1  . dexamethasone (DECADRON) 2 MG tablet Take 2 mg by mouth daily.  1  . lisinopril (PRINIVIL,ZESTRIL) 40 MG tablet TAKE 1 TABLET BY MOUTH ONCE DAILY 30 tablet 11  . loperamide (IMODIUM) 2 MG capsule Take 1 capsule (2 mg total) by mouth 4 (four) times daily as needed for diarrhea or loose stools. 12 capsule 0  . morphine (MS CONTIN) 15 MG 12 hr tablet Take 1 tablet (15 mg total) every 12 (twelve) hours by mouth. 60 tablet 0  . oxyCODONE-acetaminophen (ROXICET) 5-325 MG tablet Take 1 tablet by mouth every 6 (six) hours as needed for severe pain. 90 tablet 0  . REVLIMID 10 MG capsule Take 1 capsule by mouth every day for 21 days on, then 7 days off. 21 capsule 0   No current facility-administered medications for this visit.  PHYSICAL EXAMINATION: ECOG PERFORMANCE STATUS: 2 - Symptomatic, <50% confined to bed  Vitals:   11/24/17 0956  BP: 133/79  Pulse: 73  Resp: 18  Temp: (!) 97.4 F (36.3 C)  SpO2: 96%   Filed Weights   11/24/17 0956  Weight: 137 lb 8 oz (62.4 kg)    GENERAL:alert, no distress and comfortable SKIN: skin color, texture, turgor are normal, no rashes or significant lesions EYES: normal, Conjunctiva are pink and non-injected, sclera clear OROPHARYNX:no exudate, no erythema and lips, buccal  mucosa, and tongue normal  NECK: supple, thyroid normal size, non-tender, without nodularity LYMPH:  no palpable lymphadenopathy in the cervical, axillary or inguinal LUNGS: clear to auscultation and percussion with normal breathing effort HEART: regular rate & rhythm and no murmurs and no lower extremity edema ABDOMEN:abdomen soft, non-tender and normal bowel sounds Musculoskeletal:no cyanosis of digits and no clubbing  NEURO: alert & oriented x 3 with fluent speech, no focal motor/sensory deficits  LABORATORY DATA:  I have reviewed the data as listed    Component Value Date/Time   NA 143 11/24/2017 0932   NA 145 11/03/2017 0930   K 3.7 11/24/2017 0932   K 3.6 11/03/2017 0930   CL 107 11/24/2017 0932   CO2 28 11/24/2017 0932   CO2 27 11/03/2017 0930   GLUCOSE 83 11/24/2017 0932   GLUCOSE 112 11/03/2017 0930   BUN 18 11/24/2017 0932   BUN 13.4 11/03/2017 0930   CREATININE 0.87 11/24/2017 0932   CREATININE 0.9 11/03/2017 0930   CALCIUM 9.0 11/24/2017 0932   CALCIUM 9.4 11/03/2017 0930   PROT 6.2 (L) 11/24/2017 0932   PROT 6.2 11/03/2017 0930   PROT 6.9 11/03/2017 0930   ALBUMIN 3.2 (L) 11/24/2017 0932   ALBUMIN 3.2 (L) 11/03/2017 0930   AST 16 11/24/2017 0932   AST 20 11/03/2017 0930   ALT 16 11/24/2017 0932   ALT 18 11/03/2017 0930   ALKPHOS 65 11/24/2017 0932   ALKPHOS 84 11/03/2017 0930   BILITOT 0.6 11/24/2017 0932   BILITOT 0.64 11/03/2017 0930   GFRNONAA >60 11/24/2017 0932   GFRNONAA 64 01/17/2016 0910   GFRAA >60 11/24/2017 0932   GFRAA 74 01/17/2016 0910    No results found for: SPEP, UPEP  Lab Results  Component Value Date   WBC 4.9 11/24/2017   NEUTROABS 3.1 11/24/2017   HGB 11.8 11/24/2017   HCT 36.2 11/24/2017   MCV 94.3 11/24/2017   PLT 131 (L) 11/24/2017      Chemistry      Component Value Date/Time   NA 143 11/24/2017 0932   NA 145 11/03/2017 0930   K 3.7 11/24/2017 0932   K 3.6 11/03/2017 0930   CL 107 11/24/2017 0932   CO2 28  11/24/2017 0932   CO2 27 11/03/2017 0930   BUN 18 11/24/2017 0932   BUN 13.4 11/03/2017 0930   CREATININE 0.87 11/24/2017 0932   CREATININE 0.9 11/03/2017 0930      Component Value Date/Time   CALCIUM 9.0 11/24/2017 0932   CALCIUM 9.4 11/03/2017 0930   ALKPHOS 65 11/24/2017 0932   ALKPHOS 84 11/03/2017 0930   AST 16 11/24/2017 0932   AST 20 11/03/2017 0930   ALT 16 11/24/2017 0932   ALT 18 11/03/2017 0930   BILITOT 0.6 11/24/2017 0932   BILITOT 0.64 11/03/2017 0930      ASSESSMENT & PLAN:  Multiple myeloma in relapse ([facility]) She has recent fluctuation of myeloma panel, could be due to spirits result  versus recent infection We will repeat myeloma panel again in 2 weeks The patient has progressive decline of dementia I will have further discussion about goals of care with the patient and family members next month after myeloma panel is available In the meantime, she will continue Velcade every other week, dexamethasone daily at 1 mg along with Revlimid She will continue calcium with vitamin D supplement and Zometa every 3 months She will continue acyclovir for antimicrobial prophylaxis and aspirin for DVT prophylaxis  Acquired pancytopenia ([place]) She has intermittent pancytopenia We will continue treatment as scheduled She does not need any further dose reduction  Cancer associated pain Her cancer pain is improving with improved disease control. We will continue current pain management without changes.   Malignant cachexia ([place]) She has poor appetite and malignant cachexia She will continue low-dose dexamethasone  Dementia The patient has progressive dementia with decline in performance status She had recent recurrent falls and infection Family members are having difficulties caring for her and she have difficulties coping at home Her sister has requested a special needs form to be filled to request additional medical assistant to pay for personal care services I will try  my best ability to help her apply for this assistant.   No orders of the defined types were placed in this encounter.  All questions were answered. The patient knows to call the clinic with any problems, questions or concerns. No barriers to learning was detected. I spent 25 minutes counseling the patient face to face. The total time spent in the appointment was 40 minutes and more than 50% was on counseling and review of test results     Heath Lark, MD 11/26/2017 6:46 AM

## 2017-11-26 NOTE — Assessment & Plan Note (Signed)
She has intermittent pancytopenia We will continue treatment as scheduled She does not need any further dose reduction

## 2017-11-26 NOTE — Assessment & Plan Note (Signed)
Her cancer pain is improving with improved disease control. We will continue current pain management without changes.

## 2017-11-26 NOTE — Telephone Encounter (Signed)
Called patients sister, personal care services medical need form faxed to [facility] health care at [phone]. Verbalized understanding.

## 2017-11-26 NOTE — Assessment & Plan Note (Signed)
She has poor appetite and malignant cachexia She will continue low-dose dexamethasone

## 2017-11-26 NOTE — Assessment & Plan Note (Signed)
The patient has progressive dementia with decline in performance status She had recent recurrent falls and infection Family members are having difficulties caring for her and she have difficulties coping at home Her sister has requested a special needs form to be filled to request additional medical assistant to pay for personal care services I will try my best ability to help her apply for this assistant.

## 2017-11-26 NOTE — Assessment & Plan Note (Signed)
She has recent fluctuation of myeloma panel, could be due to spirits result versus recent infection We will repeat myeloma panel again in 2 weeks The patient has progressive decline of dementia I will have further discussion about goals of care with the patient and family members next month after myeloma panel is available In the meantime, she will continue Velcade every other week, dexamethasone daily at 1 mg along with Revlimid She will continue calcium with vitamin D supplement and Zometa every 3 months She will continue acyclovir for antimicrobial prophylaxis and aspirin for DVT prophylaxis

## 2017-12-01 ENCOUNTER — Other Ambulatory Visit: Payer: Self-pay

## 2017-12-01 MED ORDER — REVLIMID 10 MG PO CAPS: ORAL_CAPSULE | ORAL | 0 refills | Status: DC

## 2017-12-08 ENCOUNTER — Other Ambulatory Visit: Payer: Medicare HMO

## 2017-12-08 ENCOUNTER — Inpatient Hospital Stay: Payer: Medicare HMO

## 2017-12-08 ENCOUNTER — Encounter: Payer: Self-pay | Admitting: Neurology

## 2017-12-08 ENCOUNTER — Ambulatory Visit (INDEPENDENT_AMBULATORY_CARE_PROVIDER_SITE_OTHER): Payer: Medicare HMO | Admitting: Neurology

## 2017-12-08 VITALS — BP 120/66 | HR 90 | Ht 61.0 in | Wt 140.0 lb

## 2017-12-08 VITALS — BP 129/75 | HR 82 | Temp 98.0°F | Resp 17

## 2017-12-08 DIAGNOSIS — R64 Cachexia: Secondary | ICD-10-CM | POA: Diagnosis not present

## 2017-12-08 DIAGNOSIS — C9000 Multiple myeloma not having achieved remission: Secondary | ICD-10-CM | POA: Diagnosis not present

## 2017-12-08 DIAGNOSIS — R413 Other amnesia: Secondary | ICD-10-CM | POA: Diagnosis not present

## 2017-12-08 DIAGNOSIS — F0390 Unspecified dementia without behavioral disturbance: Secondary | ICD-10-CM

## 2017-12-08 DIAGNOSIS — G893 Neoplasm related pain (acute) (chronic): Secondary | ICD-10-CM | POA: Diagnosis not present

## 2017-12-08 DIAGNOSIS — C9001 Multiple myeloma in remission: Secondary | ICD-10-CM

## 2017-12-08 DIAGNOSIS — D61818 Other pancytopenia: Secondary | ICD-10-CM | POA: Diagnosis not present

## 2017-12-08 DIAGNOSIS — R197 Diarrhea, unspecified: Secondary | ICD-10-CM | POA: Diagnosis not present

## 2017-12-08 DIAGNOSIS — N920 Excessive and frequent menstruation with regular cycle: Secondary | ICD-10-CM | POA: Diagnosis not present

## 2017-12-08 DIAGNOSIS — F03B0 Unspecified dementia, moderate, without behavioral disturbance, psychotic disturbance, mood disturbance, and anxiety: Secondary | ICD-10-CM

## 2017-12-08 DIAGNOSIS — R531 Weakness: Secondary | ICD-10-CM | POA: Diagnosis not present

## 2017-12-08 DIAGNOSIS — C9002 Multiple myeloma in relapse: Secondary | ICD-10-CM

## 2017-12-08 DIAGNOSIS — Z5111 Encounter for antineoplastic chemotherapy: Secondary | ICD-10-CM | POA: Diagnosis not present

## 2017-12-08 LAB — CBC WITH DIFFERENTIAL/PLATELET
BASOS ABS: 0 10*3/uL (ref 0.0–0.1)
BASOS PCT: 1 %
Eosinophils Absolute: 0.1 10*3/uL (ref 0.0–0.5)
Eosinophils Relative: 2 %
HEMATOCRIT: 38.2 % (ref 34.8–46.6)
Hemoglobin: 12.1 g/dL (ref 11.6–15.9)
Lymphocytes Relative: 36 %
Lymphs Abs: 1.5 10*3/uL (ref 0.9–3.3)
MCH: 30.6 pg (ref 25.1–34.0)
MCHC: 31.7 g/dL (ref 31.5–36.0)
MCV: 96.7 fL (ref 79.5–101.0)
Monocytes Absolute: 0.4 10*3/uL (ref 0.1–0.9)
Monocytes Relative: 9 %
NEUTROS ABS: 2.2 10*3/uL (ref 1.5–6.5)
NEUTROS PCT: 52 %
Platelets: 197 10*3/uL (ref 145–400)
RBC: 3.95 MIL/uL (ref 3.70–5.45)
RDW: 15.9 % (ref 11.2–16.1)
WBC: 4.3 10*3/uL (ref 3.9–10.3)

## 2017-12-08 LAB — COMPREHENSIVE METABOLIC PANEL
ALBUMIN: 3.5 g/dL (ref 3.5–5.0)
ALT: 17 U/L (ref 0–55)
AST: 20 U/L (ref 5–34)
Alkaline Phosphatase: 54 U/L (ref 40–150)
Anion gap: 11 (ref 3–11)
BILIRUBIN TOTAL: 0.4 mg/dL (ref 0.2–1.2)
BUN: 12 mg/dL (ref 7–26)
CHLORIDE: 106 mmol/L (ref 98–109)
CO2: 26 mmol/L (ref 22–29)
CREATININE: 0.76 mg/dL (ref 0.60–1.10)
Calcium: 8.8 mg/dL (ref 8.4–10.4)
GFR calc Af Amer: >60 mL/min (ref >60)
Glucose, Bld: 68 mg/dL — ABNORMAL LOW (ref 70–140)
POTASSIUM: 3.9 mmol/L (ref 3.3–4.7)
Sodium: 143 mmol/L (ref 136–145)
TOTAL PROTEIN: 6.4 g/dL (ref 6.4–8.3)

## 2017-12-08 MED ORDER — PROCHLORPERAZINE MALEATE 10 MG PO TABS
10.0000 mg | ORAL_TABLET | Freq: Once | ORAL | Status: AC
  Administered 2017-12-08: 10 mg via ORAL

## 2017-12-08 MED ORDER — PROCHLORPERAZINE MALEATE 10 MG PO TABS
ORAL_TABLET | ORAL | Status: AC
  Filled 2017-12-08: qty 1

## 2017-12-08 MED ORDER — BORTEZOMIB CHEMO SQ INJECTION 3.5 MG (2.5MG/ML)
0.9750 mg/m2 | Freq: Once | INTRAMUSCULAR | Status: AC
  Administered 2017-12-08: 1.5 mg via SUBCUTANEOUS
  Filled 2017-12-08: qty 1.5

## 2017-12-08 NOTE — Patient Instructions (Addendum)
1. Bloodwork for TSH, B12, BUN, creatinine  Your provider has requested that you have labwork completed today. Please go to [facility] ([id number]) on the second floor of this building before leaving the office today. You do not need to check in. If you are not called within 15 minutes please check with the front desk.   2. Schedule MRI brain with and without contrast  We have sent a referral to [facility] for your MRI and they will call you directly to schedule your appt. They are located at [place]. If you need to contact them directly please call [phone].   3. We may start a medication to help with memory 4. Recommend increasing supervision at home 5. Follow-up in 4-5 months, call for any changes

## 2017-12-08 NOTE — Patient Instructions (Signed)
[facility] Discharge Instructions for Patients Receiving Chemotherapy  Today you received the following chemotherapy agents: Bortezomib (Velcade)  To help prevent nausea and vomiting after your treatment, we encourage you to take your nausea medication  as prescribed.    If you develop nausea and vomiting that is not controlled by your nausea medication, call the clinic.   BELOW ARE SYMPTOMS THAT SHOULD BE REPORTED IMMEDIATELY:  *FEVER GREATER THAN 100.5 F  *CHILLS WITH OR WITHOUT FEVER  NAUSEA AND VOMITING THAT IS NOT CONTROLLED WITH YOUR NAUSEA MEDICATION  *UNUSUAL SHORTNESS OF BREATH  *UNUSUAL BRUISING OR BLEEDING  TENDERNESS IN MOUTH AND THROAT WITH OR WITHOUT PRESENCE OF ULCERS  *URINARY PROBLEMS  *BOWEL PROBLEMS  UNUSUAL RASH Items with * indicate a potential emergency and should be followed up as soon as possible.  Feel free to call the clinic should you have any questions or concerns. The clinic phone number is [phone].  Please show the CHEMO ALERT CARD at check-in to the Emergency Department and triage nurse.   
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
## 2017-12-09 ENCOUNTER — Telehealth: Payer: Self-pay

## 2017-12-09 LAB — KAPPA/LAMBDA LIGHT CHAINS
Kappa free light chain: 104.3 mg/L — ABNORMAL HIGH (ref 3.3–19.4)
Kappa, lambda light chain ratio: 10.43 — ABNORMAL HIGH (ref 0.26–1.65)
LAMDA FREE LIGHT CHAINS: 10 mg/L (ref 5.7–26.3)

## 2017-12-09 LAB — BUN+CREAT
BUN/Creatinine Ratio: 17 (ref 12–28)
BUN: 11 mg/dL (ref 8–27)
CREATININE: 0.65 mg/dL (ref 0.57–1.00)
GFR calc Af Amer: 98 mL/min/{1.73_m2} (ref >59)
GFR calc non Af Amer: 85 mL/min/{1.73_m2} (ref >59)

## 2017-12-09 LAB — TSH: TSH: 3.04 m[IU]/L (ref 0.40–4.50)

## 2017-12-09 LAB — VITAMIN B12: Vitamin B-12: 393 pg/mL (ref 200–1100)

## 2017-12-09 NOTE — Telephone Encounter (Signed)
LMOM for pt's sister relaying message below.

## 2017-12-09 NOTE — Telephone Encounter (Signed)
-----   Message from Cameron Sprang, MD sent at 12/09/2017 10:49 AM EST ----- Pls let sister know bloodwork is normal, thanks

## 2017-12-10 LAB — MULTIPLE MYELOMA PANEL, SERUM
ALBUMIN/GLOB SERPL: 1.2 (ref 0.7–1.7)
ALPHA2 GLOB SERPL ELPH-MCNC: 0.8 g/dL (ref 0.4–1.0)
Albumin SerPl Elph-Mcnc: 3.2 g/dL (ref 2.9–4.4)
Alpha 1: 0.2 g/dL (ref 0.0–0.4)
B-Globulin SerPl Elph-Mcnc: 1.1 g/dL (ref 0.7–1.3)
GAMMA GLOB SERPL ELPH-MCNC: 0.6 g/dL (ref 0.4–1.8)
Globulin, Total: 2.7 g/dL (ref 2.2–3.9)
IGA: 186 mg/dL (ref 64–422)
IGM (IMMUNOGLOBULIN M), SRM: 15 mg/dL — AB (ref 26–217)
IgG (Immunoglobin G), Serum: 610 mg/dL — ABNORMAL LOW (ref 700–1600)
Total Protein ELP: 5.9 g/dL — ABNORMAL LOW (ref 6.0–8.5)

## 2017-12-17 DIAGNOSIS — C9002 Multiple myeloma in relapse: Secondary | ICD-10-CM | POA: Diagnosis not present

## 2017-12-18 DIAGNOSIS — C9002 Multiple myeloma in relapse: Secondary | ICD-10-CM | POA: Diagnosis not present

## 2017-12-19 DIAGNOSIS — C9002 Multiple myeloma in relapse: Secondary | ICD-10-CM | POA: Diagnosis not present

## 2017-12-21 DIAGNOSIS — C9002 Multiple myeloma in relapse: Secondary | ICD-10-CM | POA: Diagnosis not present

## 2017-12-22 ENCOUNTER — Telehealth: Payer: Self-pay | Admitting: Hematology and Oncology

## 2017-12-22 ENCOUNTER — Inpatient Hospital Stay: Payer: Medicare HMO

## 2017-12-22 ENCOUNTER — Inpatient Hospital Stay: Payer: Medicare HMO | Attending: Hematology and Oncology | Admitting: Hematology and Oncology

## 2017-12-22 VITALS — BP 138/85 | HR 73 | Temp 97.8°F | Resp 18 | Ht 61.0 in | Wt 137.4 lb

## 2017-12-22 VITALS — BP 140/78 | HR 68 | Temp 97.6°F | Resp 18

## 2017-12-22 DIAGNOSIS — F0390 Unspecified dementia without behavioral disturbance: Secondary | ICD-10-CM | POA: Diagnosis not present

## 2017-12-22 DIAGNOSIS — G893 Neoplasm related pain (acute) (chronic): Secondary | ICD-10-CM

## 2017-12-22 DIAGNOSIS — R630 Anorexia: Secondary | ICD-10-CM | POA: Diagnosis not present

## 2017-12-22 DIAGNOSIS — C9001 Multiple myeloma in remission: Secondary | ICD-10-CM

## 2017-12-22 DIAGNOSIS — C9000 Multiple myeloma not having achieved remission: Secondary | ICD-10-CM

## 2017-12-22 DIAGNOSIS — C9002 Multiple myeloma in relapse: Secondary | ICD-10-CM

## 2017-12-22 DIAGNOSIS — Z79899 Other long term (current) drug therapy: Secondary | ICD-10-CM | POA: Diagnosis not present

## 2017-12-22 DIAGNOSIS — Z5111 Encounter for antineoplastic chemotherapy: Secondary | ICD-10-CM | POA: Insufficient documentation

## 2017-12-22 DIAGNOSIS — R64 Cachexia: Secondary | ICD-10-CM

## 2017-12-22 DIAGNOSIS — M48061 Spinal stenosis, lumbar region without neurogenic claudication: Secondary | ICD-10-CM

## 2017-12-22 LAB — CBC WITH DIFFERENTIAL/PLATELET
Basophils Absolute: 0 10*3/uL (ref 0.0–0.1)
Basophils Relative: 0 %
EOS PCT: 3 %
Eosinophils Absolute: 0.2 10*3/uL (ref 0.0–0.5)
HCT: 36.8 % (ref 34.8–46.6)
HEMOGLOBIN: 12 g/dL (ref 11.6–15.9)
LYMPHS ABS: 1.3 10*3/uL (ref 0.9–3.3)
LYMPHS PCT: 22 %
MCH: 30.7 pg (ref 25.1–34.0)
MCHC: 32.7 g/dL (ref 31.5–36.0)
MCV: 93.8 fL (ref 79.5–101.0)
Monocytes Absolute: 0.6 10*3/uL (ref 0.1–0.9)
Monocytes Relative: 10 %
NEUTROS ABS: 3.7 10*3/uL (ref 1.5–6.5)
Neutrophils Relative %: 65 %
Platelets: 160 10*3/uL (ref 145–400)
RBC: 3.92 MIL/uL (ref 3.70–5.45)
RDW: 17.7 % — AB (ref 11.2–14.5)
WBC: 5.7 10*3/uL (ref 3.9–10.3)

## 2017-12-22 LAB — COMPREHENSIVE METABOLIC PANEL
ALK PHOS: 71 U/L (ref 40–150)
ALT: 20 U/L (ref 0–55)
ANION GAP: 10 (ref 3–11)
AST: 27 U/L (ref 5–34)
Albumin: 3.4 g/dL — ABNORMAL LOW (ref 3.5–5.0)
BUN: 13 mg/dL (ref 7–26)
CALCIUM: 9.2 mg/dL (ref 8.4–10.4)
CO2: 27 mmol/L (ref 22–29)
Chloride: 105 mmol/L (ref 98–109)
Creatinine, Ser: 0.75 mg/dL (ref 0.60–1.10)
GFR calc non Af Amer: >60 mL/min (ref >60)
Glucose, Bld: 87 mg/dL (ref 70–140)
Potassium: 5 mmol/L (ref 3.5–5.1)
SODIUM: 142 mmol/L (ref 136–145)
TOTAL PROTEIN: 6.5 g/dL (ref 6.4–8.3)
Total Bilirubin: 0.4 mg/dL (ref 0.2–1.2)

## 2017-12-22 MED ORDER — ZOLEDRONIC ACID 4 MG/100ML IV SOLN
4.0000 mg | Freq: Once | INTRAVENOUS | Status: DC
  Filled 2017-12-22: qty 100

## 2017-12-22 MED ORDER — BORTEZOMIB CHEMO SQ INJECTION 3.5 MG (2.5MG/ML)
0.9750 mg/m2 | Freq: Once | INTRAMUSCULAR | Status: AC
  Administered 2017-12-22: 1.5 mg via SUBCUTANEOUS
  Filled 2017-12-22: qty 1.5

## 2017-12-22 MED ORDER — PROCHLORPERAZINE MALEATE 10 MG PO TABS
10.0000 mg | ORAL_TABLET | Freq: Once | ORAL | Status: AC
  Administered 2017-12-22: 10 mg via ORAL

## 2017-12-22 MED ORDER — MORPHINE SULFATE ER 15 MG PO TBCR: 15.0000 mg | EXTENDED_RELEASE_TABLET | Freq: Two times a day (BID) | ORAL | 0 refills | Status: DC

## 2017-12-22 MED ORDER — ZOLEDRONIC ACID 4 MG/5ML IV CONC
3.5000 mg | Freq: Once | INTRAVENOUS | Status: AC
  Administered 2017-12-22: 3.5 mg via INTRAVENOUS
  Filled 2017-12-22: qty 4.38

## 2017-12-22 MED ORDER — OXYCODONE-ACETAMINOPHEN 5-325 MG PO TABS: 1.0000 | ORAL_TABLET | Freq: Four times a day (QID) | ORAL | 0 refills | Status: DC | PRN

## 2017-12-22 MED ORDER — PROCHLORPERAZINE MALEATE 10 MG PO TABS
ORAL_TABLET | ORAL | Status: AC
  Filled 2017-12-22: qty 1

## 2017-12-22 NOTE — Patient Instructions (Addendum)
[facility] Discharge Instructions for Patients Receiving Chemotherapy  Today you received the following chemotherapy agents Velcade, Zometa.  To help prevent nausea and vomiting after your treatment, we encourage you to take your nausea medication as directed   If you develop nausea and vomiting that is not controlled by your nausea medication, call the clinic.   BELOW ARE SYMPTOMS THAT SHOULD BE REPORTED IMMEDIATELY:  *FEVER GREATER THAN 100.5 F  *CHILLS WITH OR WITHOUT FEVER  NAUSEA AND VOMITING THAT IS NOT CONTROLLED WITH YOUR NAUSEA MEDICATION  *UNUSUAL SHORTNESS OF BREATH  *UNUSUAL BRUISING OR BLEEDING  TENDERNESS IN MOUTH AND THROAT WITH OR WITHOUT PRESENCE OF ULCERS  *URINARY PROBLEMS  *BOWEL PROBLEMS  UNUSUAL RASH Items with * indicate a potential emergency and should be followed up as soon as possible.  Feel free to call the clinic should you have any questions or concerns. The clinic phone number is [phone].  Please show the CHEMO ALERT CARD at check-in to the Emergency Department and triage nurse.   

## 2017-12-22 NOTE — Telephone Encounter (Signed)
Gave patient avs and calendar with appts per 2/5 los °

## 2017-12-23 ENCOUNTER — Encounter: Payer: Self-pay | Admitting: Hematology and Oncology

## 2017-12-23 DIAGNOSIS — C9002 Multiple myeloma in relapse: Secondary | ICD-10-CM | POA: Diagnosis not present

## 2017-12-23 NOTE — Assessment & Plan Note (Signed)
The patient has moderate dementia She is still undergoing evaluation by neurologist Her family members are pleased to see the results of the myeloma panel and would like her to continue treatment as discussed above

## 2017-12-23 NOTE — Assessment & Plan Note (Signed)
Recent myeloma panel showed near complete response to treatment In the meantime, she will continue Velcade every other week, dexamethasone daily at 1 mg along with Revlimid She will continue calcium with vitamin D supplement and Zometa every 3 months She will continue acyclovir for antimicrobial prophylaxis and aspirin for DVT prophylaxis

## 2017-12-23 NOTE — Assessment & Plan Note (Signed)
Her cancer pain is improving with improved disease control. We will continue current pain management without changes.  I have refilled her prescription pain medicine

## 2017-12-23 NOTE — Assessment & Plan Note (Addendum)
She has poor appetite and malignant cachexia She will continue low-dose dexamethasone I have encouraged nutritional supplement as tolerated

## 2017-12-23 NOTE — Progress Notes (Signed)
[facility] OFF[facility] PROGRESS NOTE  Patient Care Team: Susy Frizzle, MD as PCP - General (Family Medicine)  SUMMARY OF ONCOLOGIC HISTORY:   Multiple myeloma in relapse ([place])   01/09/2016 Imaging    MRI back showed Interval development of diffuse bone marrow infiltration by innumerable small lesions most consistent with multiple myeloma      01/23/2016 Bone Marrow Biopsy    Accession: [id number] Bone marrow biopsy showed 51% plasma cells      01/23/2016 Imaging    Skeletal survey showed lytic lesions      01/23/2016 Pathology Results    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 Revlimid was stopped in July and resumed in October      04/01/2016 Adverse Reaction    Treatment was placed on hold temporarily due to neutropenia       INTERVAL HISTORY: Please see below for problem oriented charting. She returns with her 2 sisters for further follow-up There is no recent falls She has seen neurologist with further workup pending Preliminary evaluation suggested moderate dementia She has no reported infection No recent worsening bone pain.  Her current prescription pain medicine is controlling her pain well She denies worsening peripheral neuropathy  REVIEW OF SYSTEMS:   Constitutional: Denies fevers, chills or abnormal weight loss Eyes: Denies blurriness of vision Ears, nose, mouth, throat, and face: Denies mucositis or sore throat Respiratory: Denies cough, dyspnea or wheezes Cardiovascular: Denies palpitation, chest discomfort or lower extremity swelling Gastrointestinal:  Denies nausea, heartburn or change in bowel habits Skin: Denies abnormal skin rashes Lymphatics: Denies new lymphadenopathy or easy bruising Neurological:Denies numbness, tingling or new weaknesses Behavioral/Psych: Mood is stable, no new changes  All other systems were reviewed with the patient and  are negative.  I have reviewed the past medical history, past surgical history, social history and family history with the patient and they are unchanged from previous note.  ALLERGIES:  is allergic to aspirin; pravastatin; and zocor [simvastatin].  MEDICATIONS:  Current Outpatient Medications  Medication Sig Dispense Refill  . acyclovir (ZOVIRAX) 400 MG tablet TAKE 1 TABLET BY MOUTH ONCE DAILY 90 tablet 3  . aspirin EC 81 MG tablet Take 81 mg by mouth daily.    . bortezomib IV (VELCADE) 3.5 MG injection Inject 1.3 mg/m2 into the vein once. Once every other week    . Cholecalciferol (VITAMIN D PO) Take by mouth.    . dexamethasone (DECADRON) 1 MG tablet Take 1 tablet (1 mg total) by mouth daily. 90 tablet 1  . dexamethasone (DECADRON) 2 MG tablet Take 2 mg by mouth daily.  1  . lisinopril (PRINIVIL,ZESTRIL) 40 MG tablet TAKE 1 TABLET BY MOUTH ONCE DAILY 30 tablet 11  . loperamide (IMODIUM) 2 MG capsule Take 1 capsule (2 mg total) by mouth 4 (four) times daily as needed for diarrhea or loose stools. 12 capsule 0  . morphine (MS CONTIN) 15 MG 12 hr tablet Take 1 tablet (15 mg total) by mouth every 12 (twelve) hours. 60 tablet 0  . oxyCODONE-acetaminophen (ROXICET) 5-325 MG tablet Take 1 tablet by mouth every 6 (six) hours as needed for severe pain. 90 tablet 0  . REVLIMID 10 MG capsule Take 1 capsule by mouth every day for 21 days on, then 7 days off. 21 capsule 0   No current facility-administered medications for this visit.  PHYSICAL EXAMINATION: ECOG PERFORMANCE STATUS: 1 - Symptomatic but completely ambulatory  Vitals:   12/22/17 0907  BP: 138/85  Pulse: 73  Resp: 18  Temp: 97.8 F (36.6 C)  SpO2: 100%   Filed Weights   12/22/17 0907  Weight: 137 lb 6.4 oz (62.3 kg)    GENERAL:alert, no distress and comfortable SKIN: skin color, texture, turgor are normal, no rashes or significant lesions EYES: normal, Conjunctiva are pink and non-injected, sclera clear OROPHARYNX:no  exudate, no erythema and lips, buccal mucosa, and tongue normal  NECK: supple, thyroid normal size, non-tender, without nodularity LYMPH:  no palpable lymphadenopathy in the cervical, axillary or inguinal LUNGS: clear to auscultation and percussion with normal breathing effort HEART: regular rate & rhythm and no murmurs and no lower extremity edema ABDOMEN:abdomen soft, non-tender and normal bowel sounds Musculoskeletal:no cyanosis of digits and no clubbing  NEURO: alert & oriented x 3 with fluent speech, no focal motor/sensory deficits  LABORATORY DATA:  I have reviewed the data as listed    Component Value Date/Time   NA 142 12/22/2017 0843   NA 145 11/03/2017 0930   K 5.0 12/22/2017 0843   K 3.6 11/03/2017 0930   CL 105 12/22/2017 0843   CO2 27 12/22/2017 0843   CO2 27 11/03/2017 0930   GLUCOSE 87 12/22/2017 0843   GLUCOSE 112 11/03/2017 0930   BUN 13 12/22/2017 0843   BUN 11 12/08/2017 1551   BUN 13.4 11/03/2017 0930   CREATININE 0.75 12/22/2017 0843   CREATININE 0.9 11/03/2017 0930   CALCIUM 9.2 12/22/2017 0843   CALCIUM 9.4 11/03/2017 0930   PROT 6.5 12/22/2017 0843   PROT 6.2 11/03/2017 0930   PROT 6.9 11/03/2017 0930   ALBUMIN 3.4 (L) 12/22/2017 0843   ALBUMIN 3.2 (L) 11/03/2017 0930   AST 27 12/22/2017 0843   AST 20 11/03/2017 0930   ALT 20 12/22/2017 0843   ALT 18 11/03/2017 0930   ALKPHOS 71 12/22/2017 0843   ALKPHOS 84 11/03/2017 0930   BILITOT 0.4 12/22/2017 0843   BILITOT 0.64 11/03/2017 0930   GFRNONAA >60 12/22/2017 0843   GFRNONAA 64 01/17/2016 0910   GFRAA >60 12/22/2017 0843   GFRAA 74 01/17/2016 0910    No results found for: SPEP, UPEP  Lab Results  Component Value Date   WBC 5.7 12/22/2017   NEUTROABS 3.7 12/22/2017   HGB 12.0 12/22/2017   HCT 36.8 12/22/2017   MCV 93.8 12/22/2017   PLT 160 12/22/2017      Chemistry      Component Value Date/Time   NA 142 12/22/2017 0843   NA 145 11/03/2017 0930   K 5.0 12/22/2017 0843   K 3.6  11/03/2017 0930   CL 105 12/22/2017 0843   CO2 27 12/22/2017 0843   CO2 27 11/03/2017 0930   BUN 13 12/22/2017 0843   BUN 11 12/08/2017 1551   BUN 13.4 11/03/2017 0930   CREATININE 0.75 12/22/2017 0843   CREATININE 0.9 11/03/2017 0930      Component Value Date/Time   CALCIUM 9.2 12/22/2017 0843   CALCIUM 9.4 11/03/2017 0930   ALKPHOS 71 12/22/2017 0843   ALKPHOS 84 11/03/2017 0930   AST 27 12/22/2017 0843   AST 20 11/03/2017 0930   ALT 20 12/22/2017 0843   ALT 18 11/03/2017 0930   BILITOT 0.4 12/22/2017 0843   BILITOT 0.64 11/03/2017 0930       ASSESSMENT & PLAN:  Multiple myeloma in relapse ([facility]) Recent myeloma panel showed  near complete response to treatment In the meantime, she will continue Velcade every other week, dexamethasone daily at 1 mg along with Revlimid She will continue calcium with vitamin D supplement and Zometa every 3 months She will continue acyclovir for antimicrobial prophylaxis and aspirin for DVT prophylaxis  Dementia The patient has moderate dementia She is still undergoing evaluation by neurologist Her family members are pleased to see the results of the myeloma panel and would like her to continue treatment as discussed above  Cancer associated pain Her cancer pain is improving with improved disease control. We will continue current pain management without changes.  I have refilled her prescription pain medicine  Malignant cachexia ([place]) She has poor appetite and malignant cachexia She will continue low-dose dexamethasone I have encouraged nutritional supplement as tolerated   Orders Placed This Encounter  Procedures  . Comprehensive metabolic panel    Standing Status:   Standing    Number of Occurrences:   33    Standing Expiration Date:   12/22/2018  . CBC with Differential/Platelet    Standing Status:   Standing    Number of Occurrences:   33    Standing Expiration Date:   12/22/2018   All questions were answered. The patient knows  to call the clinic with any problems, questions or concerns. No barriers to learning was detected. I spent 20 minutes counseling the patient face to face. The total time spent in the appointment was 25 minutes and more than 50% was on counseling and review of test results     Heath Lark, MD 12/23/2017 8:14 AM

## 2017-12-24 DIAGNOSIS — C9002 Multiple myeloma in relapse: Secondary | ICD-10-CM | POA: Diagnosis not present

## 2017-12-25 DIAGNOSIS — C9002 Multiple myeloma in relapse: Secondary | ICD-10-CM | POA: Diagnosis not present

## 2017-12-28 DIAGNOSIS — C9002 Multiple myeloma in relapse: Secondary | ICD-10-CM | POA: Diagnosis not present

## 2017-12-29 ENCOUNTER — Ambulatory Visit
Admission: RE | Admit: 2017-12-29 | Discharge: 2017-12-29 | Disposition: A | Discharge status: Home / Self Care | Payer: Medicare HMO | Source: Ambulatory Visit | Attending: Neurology | Admitting: Neurology

## 2017-12-29 DIAGNOSIS — C9002 Multiple myeloma in relapse: Secondary | ICD-10-CM | POA: Diagnosis not present

## 2017-12-29 DIAGNOSIS — R413 Other amnesia: Secondary | ICD-10-CM

## 2017-12-29 MED ORDER — GADOBENATE DIMEGLUMINE 529 MG/ML IV SOLN
13.0000 mL | Freq: Once | INTRAVENOUS | Status: AC | PRN
  Administered 2017-12-29: 13 mL via INTRAVENOUS

## 2017-12-30 DIAGNOSIS — C9002 Multiple myeloma in relapse: Secondary | ICD-10-CM | POA: Diagnosis not present

## 2017-12-31 DIAGNOSIS — C9002 Multiple myeloma in relapse: Secondary | ICD-10-CM | POA: Diagnosis not present

## 2018-01-01 DIAGNOSIS — C9002 Multiple myeloma in relapse: Secondary | ICD-10-CM | POA: Diagnosis not present

## 2018-01-02 DIAGNOSIS — C9002 Multiple myeloma in relapse: Secondary | ICD-10-CM | POA: Diagnosis not present

## 2018-01-03 DIAGNOSIS — C9002 Multiple myeloma in relapse: Secondary | ICD-10-CM | POA: Diagnosis not present

## 2018-01-04 ENCOUNTER — Other Ambulatory Visit: Payer: Self-pay | Admitting: *Deleted

## 2018-01-04 ENCOUNTER — Telehealth: Payer: Self-pay | Admitting: Hematology and Oncology

## 2018-01-04 DIAGNOSIS — C9002 Multiple myeloma in relapse: Secondary | ICD-10-CM | POA: Diagnosis not present

## 2018-01-04 MED ORDER — REVLIMID 10 MG PO CAPS: ORAL_CAPSULE | ORAL | 0 refills | Status: DC

## 2018-01-04 NOTE — Telephone Encounter (Signed)
Faxed records to [facility]

## 2018-01-05 ENCOUNTER — Inpatient Hospital Stay: Payer: Medicare HMO

## 2018-01-05 VITALS — BP 153/91 | HR 73 | Temp 97.9°F | Resp 16 | Wt 138.5 lb

## 2018-01-05 DIAGNOSIS — F0390 Unspecified dementia without behavioral disturbance: Secondary | ICD-10-CM | POA: Diagnosis not present

## 2018-01-05 DIAGNOSIS — R630 Anorexia: Secondary | ICD-10-CM | POA: Diagnosis not present

## 2018-01-05 DIAGNOSIS — C9001 Multiple myeloma in remission: Secondary | ICD-10-CM

## 2018-01-05 DIAGNOSIS — C9002 Multiple myeloma in relapse: Secondary | ICD-10-CM | POA: Diagnosis not present

## 2018-01-05 DIAGNOSIS — Z5111 Encounter for antineoplastic chemotherapy: Secondary | ICD-10-CM | POA: Diagnosis not present

## 2018-01-05 DIAGNOSIS — Z79899 Other long term (current) drug therapy: Secondary | ICD-10-CM | POA: Diagnosis not present

## 2018-01-05 DIAGNOSIS — G893 Neoplasm related pain (acute) (chronic): Secondary | ICD-10-CM | POA: Diagnosis not present

## 2018-01-05 LAB — CBC WITH DIFFERENTIAL/PLATELET
Basophils Absolute: 0.1 10*3/uL (ref 0.0–0.1)
Basophils Relative: 2 %
EOS PCT: 2 %
Eosinophils Absolute: 0.1 10*3/uL (ref 0.0–0.5)
HEMATOCRIT: 36.9 % (ref 34.8–46.6)
Hemoglobin: 11.9 g/dL (ref 11.6–15.9)
LYMPHS ABS: 1.8 10*3/uL (ref 0.9–3.3)
Lymphocytes Relative: 36 %
MCH: 30.3 pg (ref 25.1–34.0)
MCHC: 32.2 g/dL (ref 31.5–36.0)
MCV: 94.3 fL (ref 79.5–101.0)
MONO ABS: 0.6 10*3/uL (ref 0.1–0.9)
MONOS PCT: 13 %
Neutro Abs: 2.3 10*3/uL (ref 1.5–6.5)
Neutrophils Relative %: 47 %
PLATELETS: 159 10*3/uL (ref 145–400)
RBC: 3.91 MIL/uL (ref 3.70–5.45)
RDW: 18.3 % — AB (ref 11.2–14.5)
WBC: 4.9 10*3/uL (ref 3.9–10.3)

## 2018-01-05 LAB — COMPREHENSIVE METABOLIC PANEL
ALT: 22 U/L (ref 0–55)
ANION GAP: 9 (ref 3–11)
AST: 25 U/L (ref 5–34)
Albumin: 3.4 g/dL — ABNORMAL LOW (ref 3.5–5.0)
Alkaline Phosphatase: 63 U/L (ref 40–150)
BILIRUBIN TOTAL: 0.6 mg/dL (ref 0.2–1.2)
BUN: 16 mg/dL (ref 7–26)
CHLORIDE: 108 mmol/L (ref 98–109)
CO2: 27 mmol/L (ref 22–29)
Calcium: 9 mg/dL (ref 8.4–10.4)
Creatinine, Ser: 0.75 mg/dL (ref 0.60–1.10)
Glucose, Bld: 85 mg/dL (ref 70–140)
POTASSIUM: 4.1 mmol/L (ref 3.5–5.1)
Sodium: 144 mmol/L (ref 136–145)
TOTAL PROTEIN: 6.5 g/dL (ref 6.4–8.3)

## 2018-01-05 MED ORDER — PROCHLORPERAZINE MALEATE 10 MG PO TABS
ORAL_TABLET | ORAL | Status: AC
  Filled 2018-01-05: qty 1

## 2018-01-05 MED ORDER — BORTEZOMIB CHEMO SQ INJECTION 3.5 MG (2.5MG/ML)
0.9750 mg/m2 | Freq: Once | INTRAMUSCULAR | Status: AC
  Administered 2018-01-05: 1.5 mg via SUBCUTANEOUS
  Filled 2018-01-05: qty 1.5

## 2018-01-05 MED ORDER — PROCHLORPERAZINE MALEATE 10 MG PO TABS
10.0000 mg | ORAL_TABLET | Freq: Once | ORAL | Status: AC
  Administered 2018-01-05: 10 mg via ORAL

## 2018-01-05 MED FILL — MORPHINE SULF ER 15 MG TAB: 15 | 30 days supply | Qty: 60 | Fill #0

## 2018-01-05 MED FILL — LISINOPRIL 40 MG TABLET: 40 | 30 days supply | Qty: 30 | Fill #2

## 2018-01-05 MED FILL — OXYCOD/ACETAMINOPHEN 5-325M: 5-325 | 22 days supply | Qty: 90 | Fill #0

## 2018-01-05 NOTE — Patient Instructions (Signed)
[facility] Discharge Instructions for Patients Receiving Chemotherapy  Today you received the following chemotherapy agents Velcade.  To help prevent nausea and vomiting after your treatment, we encourage you to take your nausea medication as directed.  If you develop nausea and vomiting that is not controlled by your nausea medication, call the clinic.   BELOW ARE SYMPTOMS THAT SHOULD BE REPORTED IMMEDIATELY:  *FEVER GREATER THAN 100.5 F  *CHILLS WITH OR WITHOUT FEVER  NAUSEA AND VOMITING THAT IS NOT CONTROLLED WITH YOUR NAUSEA MEDICATION  *UNUSUAL SHORTNESS OF BREATH  *UNUSUAL BRUISING OR BLEEDING  TENDERNESS IN MOUTH AND THROAT WITH OR WITHOUT PRESENCE OF ULCERS  *URINARY PROBLEMS  *BOWEL PROBLEMS  UNUSUAL RASH Items with * indicate a potential emergency and should be followed up as soon as possible.  Feel free to call the clinic should you have any questions or concerns. The clinic phone number is [phone].  Please show the CHEMO ALERT CARD at check-in to the Emergency Department and triage nurse.   

## 2018-01-06 DIAGNOSIS — C9002 Multiple myeloma in relapse: Secondary | ICD-10-CM | POA: Diagnosis not present

## 2018-01-06 LAB — KAPPA/LAMBDA LIGHT CHAINS
KAPPA, LAMDA LIGHT CHAIN RATIO: 13.27 — AB (ref 0.26–1.65)
Kappa free light chain: 128.7 mg/L — ABNORMAL HIGH (ref 3.3–19.4)
Lambda free light chains: 9.7 mg/L (ref 5.7–26.3)

## 2018-01-07 DIAGNOSIS — C9002 Multiple myeloma in relapse: Secondary | ICD-10-CM | POA: Diagnosis not present

## 2018-01-07 LAB — MULTIPLE MYELOMA PANEL, SERUM
ALBUMIN SERPL ELPH-MCNC: 3.5 g/dL (ref 2.9–4.4)
ALPHA 1: 0.2 g/dL (ref 0.0–0.4)
ALPHA2 GLOB SERPL ELPH-MCNC: 0.8 g/dL (ref 0.4–1.0)
Albumin/Glob SerPl: 1.3 (ref 0.7–1.7)
B-Globulin SerPl Elph-Mcnc: 1.2 g/dL (ref 0.7–1.3)
Gamma Glob SerPl Elph-Mcnc: 0.5 g/dL (ref 0.4–1.8)
Globulin, Total: 2.7 g/dL (ref 2.2–3.9)
IGA: 199 mg/dL (ref 64–422)
IGG (IMMUNOGLOBIN G), SERUM: 636 mg/dL — AB (ref 700–1600)
IgM (Immunoglobulin M), Srm: 13 mg/dL — ABNORMAL LOW (ref 26–217)
Total Protein ELP: 6.2 g/dL (ref 6.0–8.5)

## 2018-01-08 DIAGNOSIS — C9002 Multiple myeloma in relapse: Secondary | ICD-10-CM | POA: Diagnosis not present

## 2018-01-09 DIAGNOSIS — C9002 Multiple myeloma in relapse: Secondary | ICD-10-CM | POA: Diagnosis not present

## 2018-01-10 DIAGNOSIS — C9002 Multiple myeloma in relapse: Secondary | ICD-10-CM | POA: Diagnosis not present

## 2018-01-11 DIAGNOSIS — C9002 Multiple myeloma in relapse: Secondary | ICD-10-CM | POA: Diagnosis not present

## 2018-01-12 DIAGNOSIS — C9002 Multiple myeloma in relapse: Secondary | ICD-10-CM | POA: Diagnosis not present

## 2018-01-13 DIAGNOSIS — C9002 Multiple myeloma in relapse: Secondary | ICD-10-CM | POA: Diagnosis not present

## 2018-01-14 DIAGNOSIS — C9002 Multiple myeloma in relapse: Secondary | ICD-10-CM | POA: Diagnosis not present

## 2018-01-15 DIAGNOSIS — C9002 Multiple myeloma in relapse: Secondary | ICD-10-CM | POA: Diagnosis not present

## 2018-01-16 DIAGNOSIS — C9002 Multiple myeloma in relapse: Secondary | ICD-10-CM | POA: Diagnosis not present

## 2018-01-17 DIAGNOSIS — C9002 Multiple myeloma in relapse: Secondary | ICD-10-CM | POA: Diagnosis not present

## 2018-01-18 ENCOUNTER — Other Ambulatory Visit: Payer: Self-pay | Admitting: Hematology and Oncology

## 2018-01-18 DIAGNOSIS — C9002 Multiple myeloma in relapse: Secondary | ICD-10-CM

## 2018-01-19 ENCOUNTER — Inpatient Hospital Stay: Payer: Medicare HMO

## 2018-01-19 ENCOUNTER — Inpatient Hospital Stay: Payer: Medicare HMO | Attending: Hematology and Oncology | Admitting: Hematology and Oncology

## 2018-01-19 ENCOUNTER — Encounter: Payer: Self-pay | Admitting: Hematology and Oncology

## 2018-01-19 ENCOUNTER — Telehealth: Payer: Self-pay | Admitting: Hematology and Oncology

## 2018-01-19 DIAGNOSIS — C9002 Multiple myeloma in relapse: Secondary | ICD-10-CM | POA: Diagnosis not present

## 2018-01-19 DIAGNOSIS — R609 Edema, unspecified: Secondary | ICD-10-CM | POA: Diagnosis not present

## 2018-01-19 DIAGNOSIS — I10 Essential (primary) hypertension: Secondary | ICD-10-CM | POA: Insufficient documentation

## 2018-01-19 DIAGNOSIS — C9000 Multiple myeloma not having achieved remission: Secondary | ICD-10-CM | POA: Diagnosis not present

## 2018-01-19 DIAGNOSIS — M898X8 Other specified disorders of bone, other site: Secondary | ICD-10-CM | POA: Diagnosis not present

## 2018-01-19 DIAGNOSIS — C9001 Multiple myeloma in remission: Secondary | ICD-10-CM

## 2018-01-19 DIAGNOSIS — F0390 Unspecified dementia without behavioral disturbance: Secondary | ICD-10-CM | POA: Diagnosis not present

## 2018-01-19 DIAGNOSIS — R600 Localized edema: Secondary | ICD-10-CM

## 2018-01-19 DIAGNOSIS — M7989 Other specified soft tissue disorders: Secondary | ICD-10-CM | POA: Insufficient documentation

## 2018-01-19 DIAGNOSIS — Z5111 Encounter for antineoplastic chemotherapy: Secondary | ICD-10-CM | POA: Diagnosis not present

## 2018-01-19 LAB — COMPREHENSIVE METABOLIC PANEL
ALT: 20 U/L (ref 0–55)
AST: 21 U/L (ref 5–34)
Albumin: 3.3 g/dL — ABNORMAL LOW (ref 3.5–5.0)
Alkaline Phosphatase: 69 U/L (ref 40–150)
Anion gap: 10 (ref 3–11)
BUN: 16 mg/dL (ref 7–26)
CO2: 28 mmol/L (ref 22–29)
CREATININE: 0.76 mg/dL (ref 0.60–1.10)
Calcium: 9.6 mg/dL (ref 8.4–10.4)
Chloride: 104 mmol/L (ref 98–109)
Glucose, Bld: 88 mg/dL (ref 70–140)
POTASSIUM: 4.2 mmol/L (ref 3.5–5.1)
Sodium: 142 mmol/L (ref 136–145)
Total Bilirubin: 0.4 mg/dL (ref 0.2–1.2)
Total Protein: 6.4 g/dL (ref 6.4–8.3)

## 2018-01-19 LAB — CBC WITH DIFFERENTIAL/PLATELET
Basophils Absolute: 0 10*3/uL (ref 0.0–0.1)
Basophils Relative: 1 %
Eosinophils Absolute: 0.2 10*3/uL (ref 0.0–0.5)
Eosinophils Relative: 3 %
HCT: 37.6 % (ref 34.8–46.6)
Hemoglobin: 11.8 g/dL (ref 11.6–15.9)
LYMPHS ABS: 1.8 10*3/uL (ref 0.9–3.3)
LYMPHS PCT: 35 %
MCH: 30.6 pg (ref 25.1–34.0)
MCHC: 31.4 g/dL — AB (ref 31.5–36.0)
MCV: 97.4 fL (ref 79.5–101.0)
MONO ABS: 0.6 10*3/uL (ref 0.1–0.9)
Monocytes Relative: 11 %
Neutro Abs: 2.6 10*3/uL (ref 1.5–6.5)
Neutrophils Relative %: 50 %
Platelets: 219 10*3/uL (ref 145–400)
RBC: 3.86 MIL/uL (ref 3.70–5.45)
RDW: 17.4 % — AB (ref 11.2–14.5)
WBC: 5.2 10*3/uL (ref 3.9–10.3)

## 2018-01-19 MED ORDER — ACYCLOVIR 400 MG PO TABS: 400.0000 mg | ORAL_TABLET | Freq: Every day | ORAL | 11 refills | Status: DC

## 2018-01-19 MED ORDER — PROCHLORPERAZINE MALEATE 10 MG PO TABS
10.0000 mg | ORAL_TABLET | Freq: Once | ORAL | Status: AC
  Administered 2018-01-19: 10 mg via ORAL

## 2018-01-19 MED ORDER — PROCHLORPERAZINE MALEATE 10 MG PO TABS
ORAL_TABLET | ORAL | Status: AC
  Filled 2018-01-19: qty 1

## 2018-01-19 MED ORDER — BORTEZOMIB CHEMO SQ INJECTION 3.5 MG (2.5MG/ML)
0.9750 mg/m2 | Freq: Once | INTRAMUSCULAR | Status: AC
  Administered 2018-01-19: 1.5 mg via SUBCUTANEOUS
  Filled 2018-01-19: qty 1.5

## 2018-01-19 MED FILL — ACYCLOVIR 400 MG TABLET: 400 | 90 days supply | Qty: 90 | Fill #0

## 2018-01-19 NOTE — Assessment & Plan Note (Signed)
Recent myeloma panel showed near complete response to treatment In the meantime, she will continue Velcade every other week, dexamethasone daily at 1 mg along with Revlimid She will continue calcium with vitamin D supplement and Zometa every 3 months She will continue acyclovir for antimicrobial prophylaxis and aspirin for DVT prophylaxis

## 2018-01-19 NOTE — Assessment & Plan Note (Signed)
She has intermittent elevated blood pressure, could be due to fluid retention She is not symptomatic Observe closely

## 2018-01-19 NOTE — Assessment & Plan Note (Signed)
She has chronic bilateral lower extremity swelling likely due to low albumin status, reduced mobility and fluid retention from chronic dexamethasone Recommend observation only I do not recommend diuretic therapy due to high risk of inducing severe dehydration

## 2018-01-19 NOTE — Telephone Encounter (Signed)
Gave patient AVS and calendar of upcoming march and April appointments.

## 2018-01-19 NOTE — Patient Instructions (Signed)
[facility] Discharge Instructions for Patients Receiving Chemotherapy  Today you received the following chemotherapy agents Velcade.  To help prevent nausea and vomiting after your treatment, we encourage you to take your nausea medication as directed.  If you develop nausea and vomiting that is not controlled by your nausea medication, call the clinic.   BELOW ARE SYMPTOMS THAT SHOULD BE REPORTED IMMEDIATELY:  *FEVER GREATER THAN 100.5 F  *CHILLS WITH OR WITHOUT FEVER  NAUSEA AND VOMITING THAT IS NOT CONTROLLED WITH YOUR NAUSEA MEDICATION  *UNUSUAL SHORTNESS OF BREATH  *UNUSUAL BRUISING OR BLEEDING  TENDERNESS IN MOUTH AND THROAT WITH OR WITHOUT PRESENCE OF ULCERS  *URINARY PROBLEMS  *BOWEL PROBLEMS  UNUSUAL RASH Items with * indicate a potential emergency and should be followed up as soon as possible.  Feel free to call the clinic should you have any questions or concerns. The clinic phone number is [phone].  Please show the CHEMO ALERT CARD at check-in to the Emergency Department and triage nurse.   

## 2018-01-19 NOTE — Progress Notes (Signed)
[facility] OFFICE PROGRESS NOTE  Patient Care Team: Susy Frizzle, MD as PCP - General (Family Medicine)  ASSESSMENT & PLAN:  Multiple myeloma in relapse [facility]) Recent myeloma panel showed near complete response to treatment In the meantime, she will continue Velcade every other week, dexamethasone daily at 1 mg along with Revlimid She will continue calcium with vitamin D supplement and Zometa every 3 months She will continue acyclovir for antimicrobial prophylaxis and aspirin for DVT prophylaxis  Bilateral leg edema She has chronic bilateral lower extremity swelling likely due to low albumin status, reduced mobility and fluid retention from chronic dexamethasone Recommend observation only I do not recommend diuretic therapy due to high risk of inducing severe dehydration  Essential hypertension She has intermittent elevated blood pressure, could be due to fluid retention She is not symptomatic Observe closely   No orders of the defined types were placed in this encounter.   INTERVAL HISTORY: Please see below for problem oriented charting. She returns with family members for further follow-up She denies recent infection Family members are concerned about bilateral lower extremity edema which are chronic in nature There were no recent reported falls Her chronic bone pain is stable.  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 Revlimid was  stopped in July and resumed in October      04/01/2016 Adverse Reaction    Treatment was placed on hold temporarily due to neutropenia       REVIEW OF SYSTEMS:   Constitutional: Denies fevers, chills or abnormal weight loss Eyes: Denies blurriness of vision Ears, nose, mouth, throat, and face: Denies mucositis or sore throat Respiratory: Denies cough, dyspnea or wheezes Cardiovascular: Denies palpitation, chest discomfort  Gastrointestinal:  Denies nausea, heartburn or change in bowel habits Skin: Denies abnormal skin rashes Lymphatics: Denies new lymphadenopathy or easy bruising Neurological:Denies numbness, tingling or new weaknesses Behavioral/Psych: Mood is stable, no new changes  All other systems were reviewed with the patient and are negative.  I have reviewed the past medical history, past surgical history, social history and family history with the patient and they are unchanged from previous note.  ALLERGIES:  is allergic to aspirin; pravastatin; and zocor [simvastatin].  MEDICATIONS:  Current Outpatient Medications  Medication Sig Dispense Refill  . acyclovir (ZOVIRAX) 400 MG tablet Take 1 tablet (400 mg total) by mouth daily. 90 tablet 11  . aspirin EC 81 MG tablet Take 81 mg by mouth daily.    . bortezomib IV (VELCADE) 3.5 MG injection Inject 1.3 mg/m2 into the vein once. Once every other week    . Cholecalciferol (VITAMIN D PO) Take by mouth.    . dexamethasone (DECADRON) 1 MG tablet Take 1 tablet (1 mg total) by mouth daily. 90 tablet 1  . dexamethasone (DECADRON) 2 MG tablet Take 2 mg by mouth daily.  1  . lisinopril (PRINIVIL,ZESTRIL) 40 MG tablet TAKE  1 TABLET BY MOUTH ONCE DAILY 30 tablet 11  . loperamide (IMODIUM) 2 MG capsule Take 1 capsule (2 mg total) by mouth 4 (four) times daily as needed for diarrhea or loose stools. 12 capsule 0  . morphine (MS CONTIN) 15 MG 12 hr tablet Take 1 tablet (15 mg total) by mouth every 12 (twelve) hours. 60 tablet 0  .  oxyCODONE-acetaminophen (ROXICET) 5-325 MG tablet Take 1 tablet by mouth every 6 (six) hours as needed for severe pain. 90 tablet 0  . REVLIMID 10 MG capsule Take 1 capsule by mouth every day for 21 days on, then 7 days off. 21 capsule 0   No current facility-administered medications for this visit.     PHYSICAL EXAMINATION: ECOG PERFORMANCE STATUS: 1 - Symptomatic but completely ambulatory  Vitals:   01/19/18 0850  BP: (!) 152/81  Pulse: 79  Resp: 18  Temp: 97.7 F (36.5 C)  SpO2: 100%   Filed Weights   01/19/18 0850  Weight: 143 lb 9.6 oz (65.1 kg)    GENERAL:alert, no distress and comfortable SKIN: skin color, texture, turgor are normal, no rashes or significant lesions EYES: normal, Conjunctiva are pink and non-injected, sclera clear OROPHARYNX:no exudate, no erythema and lips, buccal mucosa, and tongue normal  NECK: supple, thyroid normal size, non-tender, without nodularity LYMPH:  no palpable lymphadenopathy in the cervical, axillary or inguinal LUNGS: clear to auscultation and percussion with normal breathing effort HEART: regular rate & rhythm and no murmurs with mild bilateral lower extremity edema ABDOMEN:abdomen soft, non-tender and normal bowel sounds Musculoskeletal:no cyanosis of digits and no clubbing  NEURO: alert & oriented x 3 with fluent speech, no focal motor/sensory deficits  LABORATORY DATA:  I have reviewed the data as listed    Component Value Date/Time   NA 144 01/05/2018 0830   NA 145 11/03/2017 0930   K 4.1 01/05/2018 0830   K 3.6 11/03/2017 0930   CL 108 01/05/2018 0830   CO2 27 01/05/2018 0830   CO2 27 11/03/2017 0930   GLUCOSE 85 01/05/2018 0830   GLUCOSE 112 11/03/2017 0930   BUN 16 01/05/2018 0830   BUN 11 12/08/2017 1551   BUN 13.4 11/03/2017 0930   CREATININE 0.75 01/05/2018 0830   CREATININE 0.9 11/03/2017 0930   CALCIUM 9.0 01/05/2018 0830   CALCIUM 9.4 11/03/2017 0930   PROT 6.5 01/05/2018 0830   PROT 6.2 11/03/2017 0930    PROT 6.9 11/03/2017 0930   ALBUMIN 3.4 (L) 01/05/2018 0830   ALBUMIN 3.2 (L) 11/03/2017 0930   AST 25 01/05/2018 0830   AST 20 11/03/2017 0930   ALT 22 01/05/2018 0830   ALT 18 11/03/2017 0930   ALKPHOS 63 01/05/2018 0830   ALKPHOS 84 11/03/2017 0930   BILITOT 0.6 01/05/2018 0830   BILITOT 0.64 11/03/2017 0930   GFRNONAA >60 01/05/2018 0830   GFRNONAA 64 01/17/2016 0910   GFRAA >60 01/05/2018 0830   GFRAA 74 01/17/2016 0910    No results found for: SPEP, UPEP  Lab Results  Component Value Date   WBC 5.2 01/19/2018   NEUTROABS 2.6 01/19/2018   HGB 11.8 01/19/2018   HCT 37.6 01/19/2018   MCV 97.4 01/19/2018   PLT 219 01/19/2018      Chemistry      Component Value Date/Time   NA 144 01/05/2018 0830   NA 145 11/03/2017 0930   K 4.1 01/05/2018 0830   K 3.6 11/03/2017 0930   CL 108 01/05/2018 0830   CO2  27 01/05/2018 0830   CO2 27 11/03/2017 0930   BUN 16 01/05/2018 0830   BUN 11 12/08/2017 1551   BUN 13.4 11/03/2017 0930   CREATININE 0.75 01/05/2018 0830   CREATININE 0.9 11/03/2017 0930      Component Value Date/Time   CALCIUM 9.0 01/05/2018 0830   CALCIUM 9.4 11/03/2017 0930   ALKPHOS 63 01/05/2018 0830   ALKPHOS 84 11/03/2017 0930   AST 25 01/05/2018 0830   AST 20 11/03/2017 0930   ALT 22 01/05/2018 0830   ALT 18 11/03/2017 0930   BILITOT 0.6 01/05/2018 0830   BILITOT 0.64 11/03/2017 0930       RADIOGRAPHIC STUDIES: I have personally reviewed the radiological images as listed and agreed with the findings in the report. Mr Jeri Cos Wo Contrast  Result Date: 12/29/2017 CLINICAL DATA:  Memory loss dementia.  Multiple myeloma EXAM: MRI HEAD WITHOUT AND WITH CONTRAST TECHNIQUE: Multiplanar, multiecho pulse sequences of the brain and surrounding structures were obtained without and with intravenous contrast. CONTRAST:  13 mL MultiHance IV COMPARISON:  None. FINDINGS: Brain: Image quality degraded by mild motion. Ventricle size and cerebral volume normal for age.  Negative for acute infarct. Chronic ischemic changes in the white matter and right internal capsule anteriorly. Negative for mass. Chronic hemorrhage right internal capsule with associated volume loss and infarction. Normal enhancement postcontrast administration. Vascular: Normal arterial flow voids. Skull and upper cervical spine: No worrisome skull lesions. Sinuses/Orbits: Mucosal edema paranasal sinuses.  Normal orbit Other: None IMPRESSION: Cerebral volume normal for age. Negative for hydrocephalus. Chronic hemorrhage right basal ganglia. No acute abnormality. Electronically Signed   By: Franchot Gallo M.D.   On: 12/29/2017 15:02    All questions were answered. The patient knows to call the clinic with any problems, questions or concerns. No barriers to learning was detected.  I spent 15 minutes counseling the patient face to face. The total time spent in the appointment was 20 minutes and more than 50% was on counseling and review of test results  Heath Lark, MD 01/19/2018 9:11 AM

## 2018-01-20 ENCOUNTER — Other Ambulatory Visit: Payer: Self-pay | Admitting: Hematology and Oncology

## 2018-01-20 DIAGNOSIS — C9002 Multiple myeloma in relapse: Secondary | ICD-10-CM | POA: Diagnosis not present

## 2018-01-21 DIAGNOSIS — C9002 Multiple myeloma in relapse: Secondary | ICD-10-CM | POA: Diagnosis not present

## 2018-01-22 DIAGNOSIS — C9002 Multiple myeloma in relapse: Secondary | ICD-10-CM | POA: Diagnosis not present

## 2018-01-23 DIAGNOSIS — C9002 Multiple myeloma in relapse: Secondary | ICD-10-CM | POA: Diagnosis not present

## 2018-01-24 DIAGNOSIS — C9002 Multiple myeloma in relapse: Secondary | ICD-10-CM | POA: Diagnosis not present

## 2018-01-25 ENCOUNTER — Other Ambulatory Visit: Payer: Self-pay | Admitting: *Deleted

## 2018-01-25 DIAGNOSIS — C9002 Multiple myeloma in relapse: Secondary | ICD-10-CM | POA: Diagnosis not present

## 2018-01-25 MED ORDER — REVLIMID 10 MG PO CAPS: ORAL_CAPSULE | ORAL | 0 refills | Status: DC

## 2018-01-26 DIAGNOSIS — C9002 Multiple myeloma in relapse: Secondary | ICD-10-CM | POA: Diagnosis not present

## 2018-01-27 DIAGNOSIS — C9002 Multiple myeloma in relapse: Secondary | ICD-10-CM | POA: Diagnosis not present

## 2018-01-28 DIAGNOSIS — C9002 Multiple myeloma in relapse: Secondary | ICD-10-CM | POA: Diagnosis not present

## 2018-01-29 DIAGNOSIS — C9002 Multiple myeloma in relapse: Secondary | ICD-10-CM | POA: Diagnosis not present

## 2018-01-30 DIAGNOSIS — C9002 Multiple myeloma in relapse: Secondary | ICD-10-CM | POA: Diagnosis not present

## 2018-01-31 DIAGNOSIS — C9002 Multiple myeloma in relapse: Secondary | ICD-10-CM | POA: Diagnosis not present

## 2018-02-02 ENCOUNTER — Inpatient Hospital Stay: Payer: Medicare HMO

## 2018-02-02 VITALS — BP 135/65 | HR 66 | Temp 97.8°F | Resp 17 | Wt 147.8 lb

## 2018-02-02 DIAGNOSIS — R609 Edema, unspecified: Secondary | ICD-10-CM | POA: Diagnosis not present

## 2018-02-02 DIAGNOSIS — M7989 Other specified soft tissue disorders: Secondary | ICD-10-CM | POA: Diagnosis not present

## 2018-02-02 DIAGNOSIS — F0390 Unspecified dementia without behavioral disturbance: Secondary | ICD-10-CM | POA: Diagnosis not present

## 2018-02-02 DIAGNOSIS — C9002 Multiple myeloma in relapse: Secondary | ICD-10-CM

## 2018-02-02 DIAGNOSIS — C9000 Multiple myeloma not having achieved remission: Secondary | ICD-10-CM | POA: Diagnosis not present

## 2018-02-02 DIAGNOSIS — I10 Essential (primary) hypertension: Secondary | ICD-10-CM | POA: Diagnosis not present

## 2018-02-02 DIAGNOSIS — M898X8 Other specified disorders of bone, other site: Secondary | ICD-10-CM | POA: Diagnosis not present

## 2018-02-02 DIAGNOSIS — C9001 Multiple myeloma in remission: Secondary | ICD-10-CM

## 2018-02-02 DIAGNOSIS — Z5111 Encounter for antineoplastic chemotherapy: Secondary | ICD-10-CM | POA: Diagnosis not present

## 2018-02-02 LAB — COMPREHENSIVE METABOLIC PANEL
ALBUMIN: 3.2 g/dL — AB (ref 3.5–5.0)
ALT: 25 U/L (ref 0–55)
AST: 23 U/L (ref 5–34)
Alkaline Phosphatase: 62 U/L (ref 40–150)
Anion gap: 9 (ref 3–11)
BILIRUBIN TOTAL: 0.5 mg/dL (ref 0.2–1.2)
BUN: 11 mg/dL (ref 7–26)
CHLORIDE: 108 mmol/L (ref 98–109)
CO2: 26 mmol/L (ref 22–29)
Calcium: 8.6 mg/dL (ref 8.4–10.4)
Creatinine, Ser: 0.74 mg/dL (ref 0.60–1.10)
GFR calc Af Amer: >60 mL/min (ref >60)
GFR calc non Af Amer: >60 mL/min (ref >60)
Glucose, Bld: 81 mg/dL (ref 70–140)
POTASSIUM: 3.5 mmol/L (ref 3.5–5.1)
Sodium: 143 mmol/L (ref 136–145)
TOTAL PROTEIN: 6.2 g/dL — AB (ref 6.4–8.3)

## 2018-02-02 LAB — CBC WITH DIFFERENTIAL/PLATELET
Basophils Absolute: 0.1 10*3/uL (ref 0.0–0.1)
Basophils Relative: 1 %
EOS PCT: 4 %
Eosinophils Absolute: 0.2 10*3/uL (ref 0.0–0.5)
HCT: 36 % (ref 34.8–46.6)
Hemoglobin: 11.6 g/dL (ref 11.6–15.9)
LYMPHS ABS: 1.5 10*3/uL (ref 0.9–3.3)
LYMPHS PCT: 36 %
MCH: 30.4 pg (ref 25.1–34.0)
MCHC: 32.3 g/dL (ref 31.5–36.0)
MCV: 94.2 fL (ref 79.5–101.0)
MONO ABS: 0.4 10*3/uL (ref 0.1–0.9)
Monocytes Relative: 10 %
Neutro Abs: 1.9 10*3/uL (ref 1.5–6.5)
Neutrophils Relative %: 49 %
PLATELETS: 151 10*3/uL (ref 145–400)
RBC: 3.82 MIL/uL (ref 3.70–5.45)
RDW: 19.2 % — AB (ref 11.2–14.5)
WBC: 4 10*3/uL (ref 3.9–10.3)

## 2018-02-02 MED ORDER — PROCHLORPERAZINE MALEATE 10 MG PO TABS
ORAL_TABLET | ORAL | Status: AC
  Filled 2018-02-02: qty 1

## 2018-02-02 MED ORDER — PROCHLORPERAZINE MALEATE 10 MG PO TABS
10.0000 mg | ORAL_TABLET | Freq: Once | ORAL | Status: AC
  Administered 2018-02-02: 10 mg via ORAL

## 2018-02-02 MED ORDER — BORTEZOMIB CHEMO SQ INJECTION 3.5 MG (2.5MG/ML)
0.9750 mg/m2 | Freq: Once | INTRAMUSCULAR | Status: AC
  Administered 2018-02-02: 1.5 mg via SUBCUTANEOUS
  Filled 2018-02-02: qty 1.5

## 2018-02-02 NOTE — Patient Instructions (Signed)
[facility] Discharge Instructions for Patients Receiving Chemotherapy  Today you received the following chemotherapy agents: Bortezomib (Velcade)  To help prevent nausea and vomiting after your treatment, we encourage you to take your nausea medication  as prescribed.    If you develop nausea and vomiting that is not controlled by your nausea medication, call the clinic.   BELOW ARE SYMPTOMS THAT SHOULD BE REPORTED IMMEDIATELY:  *FEVER GREATER THAN 100.5 F  *CHILLS WITH OR WITHOUT FEVER  NAUSEA AND VOMITING THAT IS NOT CONTROLLED WITH YOUR NAUSEA MEDICATION  *UNUSUAL SHORTNESS OF BREATH  *UNUSUAL BRUISING OR BLEEDING  TENDERNESS IN MOUTH AND THROAT WITH OR WITHOUT PRESENCE OF ULCERS  *URINARY PROBLEMS  *BOWEL PROBLEMS  UNUSUAL RASH Items with * indicate a potential emergency and should be followed up as soon as possible.  Feel free to call the clinic should you have any questions or concerns. The clinic phone number is [phone].  Please show the CHEMO ALERT CARD at check-in to the Emergency Department and triage nurse.   

## 2018-02-03 LAB — KAPPA/LAMBDA LIGHT CHAINS
KAPPA FREE LGHT CHN: 135.4 mg/L — AB (ref 3.3–19.4)
KAPPA, LAMDA LIGHT CHAIN RATIO: 14.1 — AB (ref 0.26–1.65)
LAMDA FREE LIGHT CHAINS: 9.6 mg/L (ref 5.7–26.3)

## 2018-02-04 LAB — MULTIPLE MYELOMA PANEL, SERUM
ALBUMIN SERPL ELPH-MCNC: 3.2 g/dL (ref 2.9–4.4)
Albumin/Glob SerPl: 1.3 (ref 0.7–1.7)
Alpha 1: 0.2 g/dL (ref 0.0–0.4)
Alpha2 Glob SerPl Elph-Mcnc: 0.8 g/dL (ref 0.4–1.0)
B-Globulin SerPl Elph-Mcnc: 1.1 g/dL (ref 0.7–1.3)
GAMMA GLOB SERPL ELPH-MCNC: 0.5 g/dL (ref 0.4–1.8)
GLOBULIN, TOTAL: 2.5 g/dL (ref 2.2–3.9)
IGA: 223 mg/dL (ref 64–422)
IgG (Immunoglobin G), Serum: 620 mg/dL — ABNORMAL LOW (ref 700–1600)
IgM (Immunoglobulin M), Srm: 9 mg/dL — ABNORMAL LOW (ref 26–217)
M Protein SerPl Elph-Mcnc: 0.2 g/dL — ABNORMAL HIGH
Total Protein ELP: 5.7 g/dL — ABNORMAL LOW (ref 6.0–8.5)

## 2018-02-08 DIAGNOSIS — C9002 Multiple myeloma in relapse: Secondary | ICD-10-CM | POA: Diagnosis not present

## 2018-02-09 DIAGNOSIS — C9002 Multiple myeloma in relapse: Secondary | ICD-10-CM | POA: Diagnosis not present

## 2018-02-10 DIAGNOSIS — C9002 Multiple myeloma in relapse: Secondary | ICD-10-CM | POA: Diagnosis not present

## 2018-02-11 DIAGNOSIS — C9002 Multiple myeloma in relapse: Secondary | ICD-10-CM | POA: Diagnosis not present

## 2018-02-12 DIAGNOSIS — C9002 Multiple myeloma in relapse: Secondary | ICD-10-CM | POA: Diagnosis not present

## 2018-02-13 DIAGNOSIS — C9002 Multiple myeloma in relapse: Secondary | ICD-10-CM | POA: Diagnosis not present

## 2018-02-14 DIAGNOSIS — C9002 Multiple myeloma in relapse: Secondary | ICD-10-CM | POA: Diagnosis not present

## 2018-02-15 DIAGNOSIS — C9002 Multiple myeloma in relapse: Secondary | ICD-10-CM | POA: Diagnosis not present

## 2018-02-16 ENCOUNTER — Encounter: Payer: Self-pay | Admitting: Hematology and Oncology

## 2018-02-16 ENCOUNTER — Inpatient Hospital Stay: Payer: Medicare HMO

## 2018-02-16 ENCOUNTER — Inpatient Hospital Stay (HOSPITAL_BASED_OUTPATIENT_CLINIC_OR_DEPARTMENT_OTHER): Payer: Medicare HMO | Admitting: Hematology and Oncology

## 2018-02-16 ENCOUNTER — Inpatient Hospital Stay: Payer: Medicare HMO | Attending: Hematology and Oncology

## 2018-02-16 ENCOUNTER — Telehealth: Payer: Self-pay | Admitting: Hematology and Oncology

## 2018-02-16 DIAGNOSIS — F0390 Unspecified dementia without behavioral disturbance: Secondary | ICD-10-CM

## 2018-02-16 DIAGNOSIS — C9002 Multiple myeloma in relapse: Secondary | ICD-10-CM | POA: Diagnosis not present

## 2018-02-16 DIAGNOSIS — Z79899 Other long term (current) drug therapy: Secondary | ICD-10-CM

## 2018-02-16 DIAGNOSIS — Z5111 Encounter for antineoplastic chemotherapy: Secondary | ICD-10-CM

## 2018-02-16 DIAGNOSIS — G893 Neoplasm related pain (acute) (chronic): Secondary | ICD-10-CM | POA: Insufficient documentation

## 2018-02-16 DIAGNOSIS — M545 Low back pain: Secondary | ICD-10-CM | POA: Insufficient documentation

## 2018-02-16 DIAGNOSIS — Z7982 Long term (current) use of aspirin: Secondary | ICD-10-CM | POA: Insufficient documentation

## 2018-02-16 DIAGNOSIS — Z9221 Personal history of antineoplastic chemotherapy: Secondary | ICD-10-CM | POA: Insufficient documentation

## 2018-02-16 DIAGNOSIS — C9001 Multiple myeloma in remission: Secondary | ICD-10-CM

## 2018-02-16 LAB — CBC WITH DIFFERENTIAL/PLATELET
Basophils Absolute: 0.1 10*3/uL (ref 0.0–0.1)
Basophils Relative: 1 %
EOS ABS: 0.2 10*3/uL (ref 0.0–0.5)
Eosinophils Relative: 4 %
HEMATOCRIT: 41 % (ref 34.8–46.6)
HEMOGLOBIN: 12.7 g/dL (ref 11.6–15.9)
LYMPHS ABS: 1.4 10*3/uL (ref 0.9–3.3)
Lymphocytes Relative: 24 %
MCH: 30.5 pg (ref 25.1–34.0)
MCHC: 31 g/dL — AB (ref 31.5–36.0)
MCV: 98.3 fL (ref 79.5–101.0)
Monocytes Absolute: 0.3 10*3/uL (ref 0.1–0.9)
Monocytes Relative: 6 %
NEUTROS PCT: 65 %
Neutro Abs: 4 10*3/uL (ref 1.5–6.5)
Platelets: 174 10*3/uL (ref 145–400)
RBC: 4.17 MIL/uL (ref 3.70–5.45)
RDW: 18.3 % — ABNORMAL HIGH (ref 11.2–14.5)
WBC: 6 10*3/uL (ref 3.9–10.3)

## 2018-02-16 LAB — COMPREHENSIVE METABOLIC PANEL
ALK PHOS: 68 U/L (ref 40–150)
ALT: 26 U/L (ref 0–55)
ANION GAP: 9 (ref 3–11)
AST: 27 U/L (ref 5–34)
Albumin: 3.4 g/dL — ABNORMAL LOW (ref 3.5–5.0)
BILIRUBIN TOTAL: 0.4 mg/dL (ref 0.2–1.2)
BUN: 12 mg/dL (ref 7–26)
CALCIUM: 9.2 mg/dL (ref 8.4–10.4)
CO2: 28 mmol/L (ref 22–29)
Chloride: 107 mmol/L (ref 98–109)
Creatinine, Ser: 0.79 mg/dL (ref 0.60–1.10)
GFR calc non Af Amer: >60 mL/min (ref >60)
Glucose, Bld: 80 mg/dL (ref 70–140)
Potassium: 4.6 mmol/L (ref 3.5–5.1)
SODIUM: 144 mmol/L (ref 136–145)
TOTAL PROTEIN: 6.6 g/dL (ref 6.4–8.3)

## 2018-02-16 MED ORDER — PROCHLORPERAZINE MALEATE 10 MG PO TABS
ORAL_TABLET | ORAL | Status: AC
  Filled 2018-02-16: qty 1

## 2018-02-16 MED ORDER — LENALIDOMIDE 15 MG PO CAPS: ORAL_CAPSULE | ORAL | 11 refills | Status: DC

## 2018-02-16 MED ORDER — PROCHLORPERAZINE MALEATE 10 MG PO TABS
10.0000 mg | ORAL_TABLET | Freq: Once | ORAL | Status: AC
  Administered 2018-02-16: 10 mg via ORAL

## 2018-02-16 MED ORDER — BORTEZOMIB CHEMO SQ INJECTION 3.5 MG (2.5MG/ML)
0.9750 mg/m2 | Freq: Once | INTRAMUSCULAR | Status: AC
  Administered 2018-02-16: 1.5 mg via SUBCUTANEOUS
  Filled 2018-02-16: qty 1.5

## 2018-02-16 MED ORDER — DEXAMETHASONE 1 MG PO TABS: 1.0000 mg | ORAL_TABLET | Freq: Every day | ORAL | 1 refills | Status: DC

## 2018-02-16 MED FILL — DEXAMETHASONE 1 MG TABLET: 1 | 90 days supply | Qty: 90 | Fill #1

## 2018-02-16 NOTE — Assessment & Plan Note (Addendum)
Recent myeloma panel showed near complete response to treatment but with mildly progressive elevated light chains In the meantime, she will continue Velcade every other week, dexamethasone daily at 1 mg along with Revlimid After her current prescription is completed, I plan to increase the dose of Revlimid to 10 mg dose to be taken on days 1-21, rest 7 days She will continue calcium with vitamin D supplement and Zometa every 3 months She will continue acyclovir for antimicrobial prophylaxis and aspirin for DVT prophylaxis

## 2018-02-16 NOTE — Assessment & Plan Note (Signed)
Her cancer pain is stable. We will continue current pain management without changes.

## 2018-02-16 NOTE — Assessment & Plan Note (Signed)
The patient has moderate dementia Her sister is her dedicated healthcare power of attorney the treatment plan and decision to increase the dose of Revlimid

## 2018-02-16 NOTE — Progress Notes (Signed)
[place] OFFICE PROGRESS NOTE  Patient Care Team: Susy Frizzle, MD as PCP - General (Family Medicine)  ASSESSMENT & PLAN:  Multiple myeloma in relapse [facility]) Recent myeloma panel showed near complete response to treatment but with mildly progressive elevated light chains In the meantime, she will continue Velcade every other week, dexamethasone daily at 1 mg along with Revlimid After her current prescription is completed, I plan to increase the dose of Revlimid to 10 mg dose to be taken on days 1-21, rest 7 days She will continue calcium with vitamin D supplement and Zometa every 3 months She will continue acyclovir for antimicrobial prophylaxis and aspirin for DVT prophylaxis  Cancer associated pain Her cancer pain is stable. We will continue current pain management without changes.   Dementia The patient has moderate dementia Her sister is her dedicated healthcare power of attorney the treatment plan and decision to increase the dose of Revlimid   No orders of the defined types were placed in this encounter.   INTERVAL HISTORY: Please see below for problem oriented charting. She returns for further follow-up Her sister does not report any recent fall, infection or new pain Overall, she tolerated treatment well No peripheral neuropathy She is compliant taking all her medications as instructed  SUMMARY OF ONCOLOGIC HISTORY:   Multiple myeloma in relapse ([place])   01/09/2016 Imaging    MRI back showed Interval development of diffuse bone marrow infiltration by innumerable small lesions most consistent with multiple myeloma      01/23/2016 Bone Marrow Biopsy    Accession: [id number] Bone marrow biopsy showed 51% plasma cells      01/23/2016 Imaging    Skeletal survey showed lytic lesions      01/23/2016 Pathology Results    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  Revlimid was stopped in July and resumed in October      04/01/2016 Adverse Reaction    Treatment was placed on hold temporarily due to neutropenia       REVIEW OF SYSTEMS:   Constitutional: Denies fevers, chills or abnormal weight loss Eyes: Denies blurriness of vision Ears, nose, mouth, throat, and face: Denies mucositis or sore throat Respiratory: Denies cough, dyspnea or wheezes Cardiovascular: Denies palpitation, chest discomfort or lower extremity swelling Gastrointestinal:  Denies nausea, heartburn or change in bowel habits Skin: Denies abnormal skin rashes Lymphatics: Denies new lymphadenopathy or easy bruising Neurological:Denies numbness, tingling or new weaknesses Behavioral/Psych: Mood is stable, no new changes  All other systems were reviewed with the patient and are negative.  I have reviewed the past medical history, past surgical history, social history and family history with the patient and they are unchanged from previous note.  ALLERGIES:  is allergic to aspirin; pravastatin; and zocor [simvastatin].  MEDICATIONS:  Current Outpatient Medications  Medication Sig Dispense Refill  . acyclovir (ZOVIRAX) 400 MG tablet Take 1 tablet (400 mg total) by mouth daily. 90 tablet 11  . aspirin EC 81 MG tablet Take 81 mg by mouth daily.    . bortezomib IV (VELCADE) 3.5 MG injection Inject 1.3 mg/m2 into the vein once. Once every other week    . Cholecalciferol (VITAMIN D PO) Take by mouth.    . dexamethasone (DECADRON) 1 MG tablet Take 1 tablet (1 mg total) by mouth daily with breakfast. 90 tablet 1  . lenalidomide (REVLIMID) 15 MG  capsule Take 1 every day by mouth for 21 days, then off 7 days 21 capsule 11  . lisinopril (PRINIVIL,ZESTRIL) 40 MG tablet TAKE 1 TABLET BY MOUTH ONCE DAILY 30 tablet 11  . loperamide (IMODIUM) 2 MG capsule Take 1 capsule (2 mg total) by mouth 4 (four) times daily as needed for diarrhea or loose stools. 12 capsule 0  . morphine (MS CONTIN) 15 MG  12 hr tablet Take 1 tablet (15 mg total) by mouth every 12 (twelve) hours. 60 tablet 0  . oxyCODONE-acetaminophen (ROXICET) 5-325 MG tablet Take 1 tablet by mouth every 6 (six) hours as needed for severe pain. 90 tablet 0  . REVLIMID 10 MG capsule Take 1 capsule by mouth every day for 21 days on, then 7 days off. 21 capsule 0   No current facility-administered medications for this visit.     PHYSICAL EXAMINATION: ECOG PERFORMANCE STATUS: 1 - Symptomatic but completely ambulatory  Vitals:   02/16/18 0906  BP: 134/64  Pulse: 74  Resp: 18  Temp: 97.7 F (36.5 C)  SpO2: 99%   Filed Weights   02/16/18 0906  Weight: 150 lb 4.8 oz (68.2 kg)    GENERAL:alert, no distress and comfortable SKIN: skin color, texture, turgor are normal, no rashes or significant lesions EYES: normal, Conjunctiva are pink and non-injected, sclera clear OROPHARYNX:no exudate, no erythema and lips, buccal mucosa, and tongue normal  NECK: supple, thyroid normal size, non-tender, without nodularity LYMPH:  no palpable lymphadenopathy in the cervical, axillary or inguinal LUNGS: clear to auscultation and percussion with normal breathing effort HEART: regular rate & rhythm and no murmurs and no lower extremity edema ABDOMEN:abdomen soft, non-tender and normal bowel sounds Musculoskeletal:no cyanosis of digits and no clubbing  NEURO: alert & oriented x 3 with fluent speech, no focal motor/sensory deficits  LABORATORY DATA:  I have reviewed the data as listed    Component Value Date/Time   NA 144 02/16/2018 0834   NA 145 11/03/2017 0930   K 4.6 02/16/2018 0834   K 3.6 11/03/2017 0930   CL 107 02/16/2018 0834   CO2 28 02/16/2018 0834   CO2 27 11/03/2017 0930   GLUCOSE 80 02/16/2018 0834   GLUCOSE 112 11/03/2017 0930   BUN 12 02/16/2018 0834   BUN 11 12/08/2017 1551   BUN 13.4 11/03/2017 0930   CREATININE 0.79 02/16/2018 0834   CREATININE 0.9 11/03/2017 0930   CALCIUM 9.2 02/16/2018 0834   CALCIUM 9.4  11/03/2017 0930   PROT 6.6 02/16/2018 0834   PROT 6.2 11/03/2017 0930   PROT 6.9 11/03/2017 0930   ALBUMIN 3.4 (L) 02/16/2018 0834   ALBUMIN 3.2 (L) 11/03/2017 0930   AST 27 02/16/2018 0834   AST 20 11/03/2017 0930   ALT 26 02/16/2018 0834   ALT 18 11/03/2017 0930   ALKPHOS 68 02/16/2018 0834   ALKPHOS 84 11/03/2017 0930   BILITOT 0.4 02/16/2018 0834   BILITOT 0.64 11/03/2017 0930   GFRNONAA >60 02/16/2018 0834   GFRNONAA 64 01/17/2016 0910   GFRAA >60 02/16/2018 0834   GFRAA 74 01/17/2016 0910    No results found for: SPEP, UPEP  Lab Results  Component Value Date   WBC 6.0 02/16/2018   NEUTROABS 4.0 02/16/2018   HGB 12.7 02/16/2018   HCT 41.0 02/16/2018   MCV 98.3 02/16/2018   PLT 174 02/16/2018      Chemistry      Component Value Date/Time   NA 144 02/16/2018 0834   NA 145  11/03/2017 0930   K 4.6 02/16/2018 0834   K 3.6 11/03/2017 0930   CL 107 02/16/2018 0834   CO2 28 02/16/2018 0834   CO2 27 11/03/2017 0930   BUN 12 02/16/2018 0834   BUN 11 12/08/2017 1551   BUN 13.4 11/03/2017 0930   CREATININE 0.79 02/16/2018 0834   CREATININE 0.9 11/03/2017 0930      Component Value Date/Time   CALCIUM 9.2 02/16/2018 0834   CALCIUM 9.4 11/03/2017 0930   ALKPHOS 68 02/16/2018 0834   ALKPHOS 84 11/03/2017 0930   AST 27 02/16/2018 0834   AST 20 11/03/2017 0930   ALT 26 02/16/2018 0834   ALT 18 11/03/2017 0930   BILITOT 0.4 02/16/2018 0834   BILITOT 0.64 11/03/2017 0930       All questions were answered. The patient knows to call the clinic with any problems, questions or concerns. No barriers to learning was detected.  I spent 15 minutes counseling the patient face to face. The total time spent in the appointment was 20 minutes and more than 50% was on counseling and review of test results  Heath Lark, MD 02/16/2018 1:52 PM

## 2018-02-16 NOTE — Telephone Encounter (Signed)
Gave avs and calendar ° °

## 2018-02-16 NOTE — Patient Instructions (Signed)
[facility] Discharge Instructions for Patients Receiving Chemotherapy  Today you received the following chemotherapy agents: Bortezomib (Velcade)  To help prevent nausea and vomiting after your treatment, we encourage you to take your nausea medication  as prescribed.    If you develop nausea and vomiting that is not controlled by your nausea medication, call the clinic.   BELOW ARE SYMPTOMS THAT SHOULD BE REPORTED IMMEDIATELY:  *FEVER GREATER THAN 100.5 F  *CHILLS WITH OR WITHOUT FEVER  NAUSEA AND VOMITING THAT IS NOT CONTROLLED WITH YOUR NAUSEA MEDICATION  *UNUSUAL SHORTNESS OF BREATH  *UNUSUAL BRUISING OR BLEEDING  TENDERNESS IN MOUTH AND THROAT WITH OR WITHOUT PRESENCE OF ULCERS  *URINARY PROBLEMS  *BOWEL PROBLEMS  UNUSUAL RASH Items with * indicate a potential emergency and should be followed up as soon as possible.  Feel free to call the clinic should you have any questions or concerns. The clinic phone number is [phone].  Please show the CHEMO ALERT CARD at check-in to the Emergency Department and triage nurse.   

## 2018-02-17 DIAGNOSIS — C9002 Multiple myeloma in relapse: Secondary | ICD-10-CM | POA: Diagnosis not present

## 2018-02-17 LAB — KAPPA/LAMBDA LIGHT CHAINS
KAPPA FREE LGHT CHN: 181.9 mg/L — AB (ref 3.3–19.4)
Kappa, lambda light chain ratio: 19.77 — ABNORMAL HIGH (ref 0.26–1.65)
Lambda free light chains: 9.2 mg/L (ref 5.7–26.3)

## 2018-02-18 DIAGNOSIS — C9002 Multiple myeloma in relapse: Secondary | ICD-10-CM | POA: Diagnosis not present

## 2018-02-18 LAB — MULTIPLE MYELOMA PANEL, SERUM
ALBUMIN SERPL ELPH-MCNC: 3.2 g/dL (ref 2.9–4.4)
Albumin/Glob SerPl: 1.2 (ref 0.7–1.7)
Alpha 1: 0.3 g/dL (ref 0.0–0.4)
Alpha2 Glob SerPl Elph-Mcnc: 0.8 g/dL (ref 0.4–1.0)
B-GLOBULIN SERPL ELPH-MCNC: 1.3 g/dL (ref 0.7–1.3)
GAMMA GLOB SERPL ELPH-MCNC: 0.5 g/dL (ref 0.4–1.8)
GLOBULIN, TOTAL: 2.9 g/dL (ref 2.2–3.9)
IGA: 241 mg/dL (ref 64–422)
IGG (IMMUNOGLOBIN G), SERUM: 655 mg/dL — AB (ref 700–1600)
IgM (Immunoglobulin M), Srm: 11 mg/dL — ABNORMAL LOW (ref 26–217)
M PROTEIN SERPL ELPH-MCNC: 0.3 g/dL — AB
Total Protein ELP: 6.1 g/dL (ref 6.0–8.5)

## 2018-02-19 ENCOUNTER — Other Ambulatory Visit: Payer: Self-pay | Admitting: *Deleted

## 2018-02-19 DIAGNOSIS — C9002 Multiple myeloma in relapse: Secondary | ICD-10-CM | POA: Diagnosis not present

## 2018-02-19 MED ORDER — LENALIDOMIDE 15 MG PO CAPS: ORAL_CAPSULE | ORAL | 11 refills | Status: DC

## 2018-02-20 DIAGNOSIS — C9002 Multiple myeloma in relapse: Secondary | ICD-10-CM | POA: Diagnosis not present

## 2018-02-21 DIAGNOSIS — C9002 Multiple myeloma in relapse: Secondary | ICD-10-CM | POA: Diagnosis not present

## 2018-02-22 DIAGNOSIS — C9002 Multiple myeloma in relapse: Secondary | ICD-10-CM | POA: Diagnosis not present

## 2018-02-23 DIAGNOSIS — C9002 Multiple myeloma in relapse: Secondary | ICD-10-CM | POA: Diagnosis not present

## 2018-02-24 DIAGNOSIS — C9002 Multiple myeloma in relapse: Secondary | ICD-10-CM | POA: Diagnosis not present

## 2018-02-25 DIAGNOSIS — C9002 Multiple myeloma in relapse: Secondary | ICD-10-CM | POA: Diagnosis not present

## 2018-02-26 DIAGNOSIS — C9002 Multiple myeloma in relapse: Secondary | ICD-10-CM | POA: Diagnosis not present

## 2018-02-27 DIAGNOSIS — C9002 Multiple myeloma in relapse: Secondary | ICD-10-CM | POA: Diagnosis not present

## 2018-02-28 DIAGNOSIS — C9002 Multiple myeloma in relapse: Secondary | ICD-10-CM | POA: Diagnosis not present

## 2018-03-01 DIAGNOSIS — C9002 Multiple myeloma in relapse: Secondary | ICD-10-CM | POA: Diagnosis not present

## 2018-03-02 ENCOUNTER — Telehealth: Payer: Self-pay

## 2018-03-02 ENCOUNTER — Inpatient Hospital Stay: Payer: Medicare HMO

## 2018-03-02 ENCOUNTER — Other Ambulatory Visit: Payer: Self-pay

## 2018-03-02 VITALS — BP 146/74 | HR 64 | Temp 97.9°F | Resp 16

## 2018-03-02 DIAGNOSIS — G893 Neoplasm related pain (acute) (chronic): Secondary | ICD-10-CM | POA: Diagnosis not present

## 2018-03-02 DIAGNOSIS — F0390 Unspecified dementia without behavioral disturbance: Secondary | ICD-10-CM | POA: Diagnosis not present

## 2018-03-02 DIAGNOSIS — C9002 Multiple myeloma in relapse: Secondary | ICD-10-CM

## 2018-03-02 DIAGNOSIS — Z79899 Other long term (current) drug therapy: Secondary | ICD-10-CM | POA: Diagnosis not present

## 2018-03-02 DIAGNOSIS — M545 Low back pain: Secondary | ICD-10-CM | POA: Diagnosis not present

## 2018-03-02 DIAGNOSIS — M48061 Spinal stenosis, lumbar region without neurogenic claudication: Secondary | ICD-10-CM

## 2018-03-02 DIAGNOSIS — Z7982 Long term (current) use of aspirin: Secondary | ICD-10-CM | POA: Diagnosis not present

## 2018-03-02 DIAGNOSIS — Z5111 Encounter for antineoplastic chemotherapy: Secondary | ICD-10-CM | POA: Diagnosis not present

## 2018-03-02 DIAGNOSIS — Z9221 Personal history of antineoplastic chemotherapy: Secondary | ICD-10-CM | POA: Diagnosis not present

## 2018-03-02 DIAGNOSIS — C9001 Multiple myeloma in remission: Secondary | ICD-10-CM

## 2018-03-02 LAB — COMPREHENSIVE METABOLIC PANEL
ALK PHOS: 72 U/L (ref 40–150)
ALT: 20 U/L (ref 0–55)
ANION GAP: 10 (ref 3–11)
AST: 18 U/L (ref 5–34)
Albumin: 3.4 g/dL — ABNORMAL LOW (ref 3.5–5.0)
BUN: 15 mg/dL (ref 7–26)
CHLORIDE: 107 mmol/L (ref 98–109)
CO2: 26 mmol/L (ref 22–29)
Calcium: 9.1 mg/dL (ref 8.4–10.4)
Creatinine, Ser: 0.76 mg/dL (ref 0.60–1.10)
Glucose, Bld: 81 mg/dL (ref 70–140)
Potassium: 3.5 mmol/L (ref 3.5–5.1)
SODIUM: 143 mmol/L (ref 136–145)
TOTAL PROTEIN: 6.3 g/dL — AB (ref 6.4–8.3)
Total Bilirubin: 0.3 mg/dL (ref 0.2–1.2)

## 2018-03-02 LAB — CBC WITH DIFFERENTIAL/PLATELET
Basophils Absolute: 0 10*3/uL (ref 0.0–0.1)
Basophils Relative: 1 %
EOS ABS: 0.1 10*3/uL (ref 0.0–0.5)
EOS PCT: 3 %
HCT: 37.6 % (ref 34.8–46.6)
Hemoglobin: 12 g/dL (ref 11.6–15.9)
LYMPHS ABS: 2.6 10*3/uL (ref 0.9–3.3)
LYMPHS PCT: 48 %
MCH: 30.7 pg (ref 25.1–34.0)
MCHC: 31.9 g/dL (ref 31.5–36.0)
MCV: 96.2 fL (ref 79.5–101.0)
MONO ABS: 0.6 10*3/uL (ref 0.1–0.9)
Monocytes Relative: 11 %
Neutro Abs: 1.9 10*3/uL (ref 1.5–6.5)
Neutrophils Relative %: 37 %
PLATELETS: 183 10*3/uL (ref 145–400)
RBC: 3.91 MIL/uL (ref 3.70–5.45)
RDW: 17.3 % — ABNORMAL HIGH (ref 11.2–14.5)
WBC: 5.3 10*3/uL (ref 3.9–10.3)

## 2018-03-02 MED ORDER — PROCHLORPERAZINE MALEATE 10 MG PO TABS
10.0000 mg | ORAL_TABLET | Freq: Once | ORAL | Status: AC
  Administered 2018-03-02: 10 mg via ORAL

## 2018-03-02 MED ORDER — BORTEZOMIB CHEMO SQ INJECTION 3.5 MG (2.5MG/ML)
0.9750 mg/m2 | Freq: Once | INTRAMUSCULAR | Status: AC
  Administered 2018-03-02: 1.5 mg via SUBCUTANEOUS
  Filled 2018-03-02: qty 1.5

## 2018-03-02 MED ORDER — OXYCODONE-ACETAMINOPHEN 5-325 MG PO TABS: 1.0000 | ORAL_TABLET | Freq: Four times a day (QID) | ORAL | 0 refills | Status: DC | PRN

## 2018-03-02 MED ORDER — PROCHLORPERAZINE MALEATE 10 MG PO TABS
ORAL_TABLET | ORAL | Status: AC
  Filled 2018-03-02: qty 1

## 2018-03-02 MED FILL — OXYCODONE-ACETAMINOPHEN 5-3: 5-325 | 22 days supply | Qty: 90 | Fill #0

## 2018-03-02 NOTE — Telephone Encounter (Signed)
Nurse called from infusion room. Sister requesting refill on Oxycodone.

## 2018-03-02 NOTE — Patient Instructions (Signed)
[facility] Discharge Instructions for Patients Receiving Chemotherapy  Today you received the following chemotherapy agents Velcade.  To help prevent nausea and vomiting after your treatment, we encourage you to take your nausea medication as directed.  If you develop nausea and vomiting that is not controlled by your nausea medication, call the clinic.   BELOW ARE SYMPTOMS THAT SHOULD BE REPORTED IMMEDIATELY:  *FEVER GREATER THAN 100.5 F  *CHILLS WITH OR WITHOUT FEVER  NAUSEA AND VOMITING THAT IS NOT CONTROLLED WITH YOUR NAUSEA MEDICATION  *UNUSUAL SHORTNESS OF BREATH  *UNUSUAL BRUISING OR BLEEDING  TENDERNESS IN MOUTH AND THROAT WITH OR WITHOUT PRESENCE OF ULCERS  *URINARY PROBLEMS  *BOWEL PROBLEMS  UNUSUAL RASH Items with * indicate a potential emergency and should be followed up as soon as possible.  Feel free to call the clinic should you have any questions or concerns. The clinic phone number is [phone].  Please show the CHEMO ALERT CARD at check-in to the Emergency Department and triage nurse.   

## 2018-03-03 DIAGNOSIS — C9002 Multiple myeloma in relapse: Secondary | ICD-10-CM | POA: Diagnosis not present

## 2018-03-04 DIAGNOSIS — C9002 Multiple myeloma in relapse: Secondary | ICD-10-CM | POA: Diagnosis not present

## 2018-03-05 DIAGNOSIS — C9002 Multiple myeloma in relapse: Secondary | ICD-10-CM | POA: Diagnosis not present

## 2018-03-06 DIAGNOSIS — C9002 Multiple myeloma in relapse: Secondary | ICD-10-CM | POA: Diagnosis not present

## 2018-03-07 DIAGNOSIS — C9002 Multiple myeloma in relapse: Secondary | ICD-10-CM | POA: Diagnosis not present

## 2018-03-08 DIAGNOSIS — C9002 Multiple myeloma in relapse: Secondary | ICD-10-CM | POA: Diagnosis not present

## 2018-03-09 DIAGNOSIS — C9002 Multiple myeloma in relapse: Secondary | ICD-10-CM | POA: Diagnosis not present

## 2018-03-10 DIAGNOSIS — C9002 Multiple myeloma in relapse: Secondary | ICD-10-CM | POA: Diagnosis not present

## 2018-03-11 DIAGNOSIS — C9002 Multiple myeloma in relapse: Secondary | ICD-10-CM | POA: Diagnosis not present

## 2018-03-12 DIAGNOSIS — C9002 Multiple myeloma in relapse: Secondary | ICD-10-CM | POA: Diagnosis not present

## 2018-03-13 DIAGNOSIS — C9002 Multiple myeloma in relapse: Secondary | ICD-10-CM | POA: Diagnosis not present

## 2018-03-14 DIAGNOSIS — C9002 Multiple myeloma in relapse: Secondary | ICD-10-CM | POA: Diagnosis not present

## 2018-03-15 DIAGNOSIS — C9002 Multiple myeloma in relapse: Secondary | ICD-10-CM | POA: Diagnosis not present

## 2018-03-16 ENCOUNTER — Inpatient Hospital Stay (HOSPITAL_BASED_OUTPATIENT_CLINIC_OR_DEPARTMENT_OTHER): Payer: Medicare HMO | Admitting: Hematology and Oncology

## 2018-03-16 ENCOUNTER — Telehealth: Payer: Self-pay | Admitting: Hematology and Oncology

## 2018-03-16 ENCOUNTER — Inpatient Hospital Stay: Payer: Medicare HMO

## 2018-03-16 ENCOUNTER — Encounter: Payer: Self-pay | Admitting: Hematology and Oncology

## 2018-03-16 DIAGNOSIS — M545 Low back pain: Secondary | ICD-10-CM | POA: Diagnosis not present

## 2018-03-16 DIAGNOSIS — Z5111 Encounter for antineoplastic chemotherapy: Secondary | ICD-10-CM

## 2018-03-16 DIAGNOSIS — Z9221 Personal history of antineoplastic chemotherapy: Secondary | ICD-10-CM

## 2018-03-16 DIAGNOSIS — Z7982 Long term (current) use of aspirin: Secondary | ICD-10-CM

## 2018-03-16 DIAGNOSIS — C9002 Multiple myeloma in relapse: Secondary | ICD-10-CM

## 2018-03-16 DIAGNOSIS — C9001 Multiple myeloma in remission: Secondary | ICD-10-CM

## 2018-03-16 DIAGNOSIS — G893 Neoplasm related pain (acute) (chronic): Secondary | ICD-10-CM

## 2018-03-16 DIAGNOSIS — Z79899 Other long term (current) drug therapy: Secondary | ICD-10-CM | POA: Diagnosis not present

## 2018-03-16 DIAGNOSIS — F0390 Unspecified dementia without behavioral disturbance: Secondary | ICD-10-CM

## 2018-03-16 LAB — CBC WITH DIFFERENTIAL/PLATELET
Basophils Absolute: 0.1 10*3/uL (ref 0.0–0.1)
Basophils Relative: 1 %
EOS ABS: 0.2 10*3/uL (ref 0.0–0.5)
Eosinophils Relative: 3 %
HCT: 38.6 % (ref 34.8–46.6)
HEMOGLOBIN: 12.6 g/dL (ref 11.6–15.9)
Lymphocytes Relative: 21 %
Lymphs Abs: 1.3 10*3/uL (ref 0.9–3.3)
MCH: 31 pg (ref 25.1–34.0)
MCHC: 32.6 g/dL (ref 31.5–36.0)
MCV: 94.9 fL (ref 79.5–101.0)
MONO ABS: 0.5 10*3/uL (ref 0.1–0.9)
MONOS PCT: 8 %
NEUTROS PCT: 67 %
Neutro Abs: 3.9 10*3/uL (ref 1.5–6.5)
Platelets: 164 10*3/uL (ref 145–400)
RBC: 4.06 MIL/uL (ref 3.70–5.45)
RDW: 17.8 % — ABNORMAL HIGH (ref 11.2–14.5)
WBC: 5.9 10*3/uL (ref 3.9–10.3)

## 2018-03-16 LAB — COMPREHENSIVE METABOLIC PANEL
ALK PHOS: 72 U/L (ref 40–150)
ALT: 28 U/L (ref 0–55)
ANION GAP: 10 (ref 3–11)
AST: 26 U/L (ref 5–34)
Albumin: 3.4 g/dL — ABNORMAL LOW (ref 3.5–5.0)
BILIRUBIN TOTAL: 0.5 mg/dL (ref 0.2–1.2)
BUN: 13 mg/dL (ref 7–26)
CALCIUM: 9.5 mg/dL (ref 8.4–10.4)
CO2: 29 mmol/L (ref 22–29)
Chloride: 104 mmol/L (ref 98–109)
Creatinine, Ser: 0.81 mg/dL (ref 0.60–1.10)
GFR calc non Af Amer: >60 mL/min (ref >60)
Glucose, Bld: 104 mg/dL (ref 70–140)
Potassium: 3.7 mmol/L (ref 3.5–5.1)
SODIUM: 143 mmol/L (ref 136–145)
TOTAL PROTEIN: 6.7 g/dL (ref 6.4–8.3)

## 2018-03-16 MED ORDER — MORPHINE SULFATE ER 15 MG PO TBCR: 15.0000 mg | EXTENDED_RELEASE_TABLET | Freq: Two times a day (BID) | ORAL | 0 refills | Status: DC

## 2018-03-16 MED ORDER — PROCHLORPERAZINE MALEATE 10 MG PO TABS
ORAL_TABLET | ORAL | Status: AC
  Filled 2018-03-16: qty 1

## 2018-03-16 MED ORDER — PROCHLORPERAZINE MALEATE 10 MG PO TABS
10.0000 mg | ORAL_TABLET | Freq: Once | ORAL | Status: AC
  Administered 2018-03-16: 10 mg via ORAL

## 2018-03-16 MED ORDER — BORTEZOMIB CHEMO SQ INJECTION 3.5 MG (2.5MG/ML)
0.9750 mg/m2 | Freq: Once | INTRAMUSCULAR | Status: AC
  Administered 2018-03-16: 1.5 mg via SUBCUTANEOUS
  Filled 2018-03-16: qty 1.5

## 2018-03-16 MED FILL — MORPHINE SULF ER 15 MG TAB: 15 | 30 days supply | Qty: 60 | Fill #0

## 2018-03-16 NOTE — Telephone Encounter (Signed)
Gave patient AVs and calendar of upcoming May appointments.  °

## 2018-03-16 NOTE — Assessment & Plan Note (Signed)
She has worsening back pain with radicular pain which raised suspicion for possible sciatica type nerve pain rather than cancer associated pain We discussed the risk and benefit of imaging studies Ultimately, her family members are in agreement to schedule long-acting pain medicine twice a day along with her breakthrough pain medicine to see if we can get her pain better controlled.

## 2018-03-16 NOTE — Assessment & Plan Note (Signed)
Recent myeloma panel showed near complete response to treatment but with mildly progressive elevated light chains We have recently increased the dose of Revlimid.  Repeat myeloma panel from today is pending I am concerned about possibility of disease progression due to worsening pain We discussed imaging studies but the family members decline imaging studies For now, I recommend scheduled MS Contin along with her prescription pain medicine I plan to see her back in 2 weeks for further assessment She will continue calcium with vitamin D supplement and Zometa every 3 months She will continue acyclovir for antimicrobial prophylaxis and aspirin for DVT prophylaxis

## 2018-03-16 NOTE — Telephone Encounter (Signed)
Faxed ROI to [facility] on 03/16/18, Release ID [id number]

## 2018-03-16 NOTE — Progress Notes (Signed)
[place] OFFICE PROGRESS NOTE  Patient Care Team: Susy Frizzle, MD as PCP - General (Family Medicine)  ASSESSMENT & PLAN:  Multiple myeloma in relapse [facility]) Recent myeloma panel showed near complete response to treatment but with mildly progressive elevated light chains We have recently increased the dose of Revlimid.  Repeat myeloma panel from today is pending I am concerned about possibility of disease progression due to worsening pain We discussed imaging studies but the family members decline imaging studies For now, I recommend scheduled MS Contin along with her prescription pain medicine I plan to see her back in 2 weeks for further assessment She will continue calcium with vitamin D supplement and Zometa every 3 months She will continue acyclovir for antimicrobial prophylaxis and aspirin for DVT prophylaxis  Cancer associated pain She has worsening back pain with radicular pain which raised suspicion for possible sciatica type nerve pain rather than cancer associated pain We discussed the risk and benefit of imaging studies Ultimately, her family members are in agreement to schedule long-acting pain medicine twice a day along with her breakthrough pain medicine to see if we can get her pain better controlled.   No orders of the defined types were placed in this encounter.   INTERVAL HISTORY: Please see below for problem oriented charting. She returns with her sisters for further follow-up She has been complaining of lower back pain radiating down to the leg, worse on the left compared to the right She reported no recent infection, fever or chills No recent falls.  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 Revlimid was stopped in July and resumed in October      04/01/2016 Adverse Reaction    Treatment was placed on hold temporarily due to neutropenia       REVIEW OF SYSTEMS:   Constitutional: Denies fevers, chills or abnormal weight loss Eyes: Denies blurriness of vision Ears, nose, mouth, throat, and face: Denies mucositis or sore throat Respiratory: Denies cough, dyspnea or wheezes Cardiovascular: Denies palpitation, chest discomfort or lower extremity swelling Gastrointestinal:  Denies nausea, heartburn or change in bowel habits Skin: Denies abnormal skin rashes Lymphatics: Denies new lymphadenopathy or easy bruising Neurological:Denies numbness, tingling or new weaknesses Behavioral/Psych: Mood is stable, no new changes  All other systems were reviewed with the patient and are negative.  I have reviewed the past medical history, past surgical history, social history and family history with the patient and they are unchanged from previous note.  ALLERGIES:  is allergic to aspirin; pravastatin; and zocor [simvastatin].  MEDICATIONS:  Current Outpatient Medications  Medication Sig Dispense Refill  . acyclovir (ZOVIRAX) 400 MG tablet Take 1 tablet (400 mg total) by mouth daily. 90 tablet 11  . aspirin EC 81 MG tablet Take 81 mg by mouth daily.    . bortezomib IV (  VELCADE) 3.5 MG injection Inject 1.3 mg/m2 into the vein once. Once every other week    . Cholecalciferol (VITAMIN D PO) Take by mouth.    . dexamethasone (DECADRON) 1 MG tablet Take 1 tablet (1 mg total) by mouth daily with breakfast. 90 tablet 1  . lenalidomide (REVLIMID) 15 MG capsule Take  1 every day by mouth for 21 days, then off 7 days 21 capsule 11  . lisinopril (PRINIVIL,ZESTRIL) 40 MG tablet TAKE 1 TABLET BY MOUTH ONCE DAILY 30 tablet 11  . loperamide (IMODIUM) 2 MG capsule Take 1 capsule (2 mg total) by mouth 4 (four) times daily as needed for diarrhea or loose stools. 12 capsule 0  . morphine (MS CONTIN) 15 MG 12 hr tablet Take 1 tablet (15 mg total) by mouth every 12 (twelve) hours. 60 tablet 0  . oxyCODONE-acetaminophen (ROXICET) 5-325 MG tablet Take 1 tablet by mouth every 6 (six) hours as needed for severe pain. 90 tablet 0   No current facility-administered medications for this visit.     PHYSICAL EXAMINATION: ECOG PERFORMANCE STATUS: 2 - Symptomatic, <50% confined to bed  Vitals:   03/16/18 0908  BP: 127/69  Pulse: 68  Resp: 18  Temp: 97.6 F (36.4 C)  SpO2: 100%   Filed Weights   03/16/18 0908  Weight: 148 lb 4.8 oz (67.3 kg)    GENERAL:alert, no distress and comfortable SKIN: skin color, texture, turgor are normal, no rashes or significant lesions EYES: normal, Conjunctiva are pink and non-injected, sclera clear OROPHARYNX:no exudate, no erythema and lips, buccal mucosa, and tongue normal  NECK: supple, thyroid normal size, non-tender, without nodularity LYMPH:  no palpable lymphadenopathy in the cervical, axillary or inguinal LUNGS: clear to auscultation and percussion with normal breathing effort HEART: regular rate & rhythm and no murmurs and no lower extremity edema ABDOMEN:abdomen soft, non-tender and normal bowel sounds Musculoskeletal:no cyanosis of digits and no clubbing  NEURO: alert & oriented x 3 with fluent speech, no focal motor/sensory deficits  LABORATORY DATA:  I have reviewed the data as listed    Component Value Date/Time   NA 143 03/16/2018 0816   NA 145 11/03/2017 0930   K 3.7 03/16/2018 0816   K 3.6 11/03/2017 0930   CL 104 03/16/2018 0816   CO2 29 03/16/2018 0816   CO2 27 11/03/2017 0930   GLUCOSE 104 03/16/2018  0816   GLUCOSE 112 11/03/2017 0930   BUN 13 03/16/2018 0816   BUN 11 12/08/2017 1551   BUN 13.4 11/03/2017 0930   CREATININE 0.81 03/16/2018 0816   CREATININE 0.9 11/03/2017 0930   CALCIUM 9.5 03/16/2018 0816   CALCIUM 9.4 11/03/2017 0930   PROT 6.7 03/16/2018 0816   PROT 6.2 11/03/2017 0930   PROT 6.9 11/03/2017 0930   ALBUMIN 3.4 (L) 03/16/2018 0816   ALBUMIN 3.2 (L) 11/03/2017 0930   AST 26 03/16/2018 0816   AST 20 11/03/2017 0930   ALT 28 03/16/2018 0816   ALT 18 11/03/2017 0930   ALKPHOS 72 03/16/2018 0816   ALKPHOS 84 11/03/2017 0930   BILITOT 0.5 03/16/2018 0816   BILITOT 0.64 11/03/2017 0930   GFRNONAA >60 03/16/2018 0816   GFRNONAA 64 01/17/2016 0910   GFRAA >60 03/16/2018 0816   GFRAA 74 01/17/2016 0910    No results found for: SPEP, UPEP  Lab Results  Component Value Date   WBC 5.9 03/16/2018   NEUTROABS 3.9 03/16/2018   HGB 12.6 03/16/2018   HCT 38.6 03/16/2018  MCV 94.9 03/16/2018   PLT 164 03/16/2018      Chemistry      Component Value Date/Time   NA 143 03/16/2018 0816   NA 145 11/03/2017 0930   K 3.7 03/16/2018 0816   K 3.6 11/03/2017 0930   CL 104 03/16/2018 0816   CO2 29 03/16/2018 0816   CO2 27 11/03/2017 0930   BUN 13 03/16/2018 0816   BUN 11 12/08/2017 1551   BUN 13.4 11/03/2017 0930   CREATININE 0.81 03/16/2018 0816   CREATININE 0.9 11/03/2017 0930      Component Value Date/Time   CALCIUM 9.5 03/16/2018 0816   CALCIUM 9.4 11/03/2017 0930   ALKPHOS 72 03/16/2018 0816   ALKPHOS 84 11/03/2017 0930   AST 26 03/16/2018 0816   AST 20 11/03/2017 0930   ALT 28 03/16/2018 0816   ALT 18 11/03/2017 0930   BILITOT 0.5 03/16/2018 0816   BILITOT 0.64 11/03/2017 0930      All questions were answered. The patient knows to call the clinic with any problems, questions or concerns. No barriers to learning was detected.  I spent 15 minutes counseling the patient face to face. The total time spent in the appointment was 20 minutes and more  than 50% was on counseling and review of test results  Heath Lark, MD 03/16/2018 3:58 PM

## 2018-03-16 NOTE — Patient Instructions (Signed)
[facility] Discharge Instructions for Patients Receiving Chemotherapy  Today you received the following chemotherapy agents Velcade.  To help prevent nausea and vomiting after your treatment, we encourage you to take your nausea medication as directed.  If you develop nausea and vomiting that is not controlled by your nausea medication, call the clinic.   BELOW ARE SYMPTOMS THAT SHOULD BE REPORTED IMMEDIATELY:  *FEVER GREATER THAN 100.5 F  *CHILLS WITH OR WITHOUT FEVER  NAUSEA AND VOMITING THAT IS NOT CONTROLLED WITH YOUR NAUSEA MEDICATION  *UNUSUAL SHORTNESS OF BREATH  *UNUSUAL BRUISING OR BLEEDING  TENDERNESS IN MOUTH AND THROAT WITH OR WITHOUT PRESENCE OF ULCERS  *URINARY PROBLEMS  *BOWEL PROBLEMS  UNUSUAL RASH Items with * indicate a potential emergency and should be followed up as soon as possible.  Feel free to call the clinic should you have any questions or concerns. The clinic phone number is [phone].  Please show the CHEMO ALERT CARD at check-in to the Emergency Department and triage nurse.   

## 2018-03-17 DIAGNOSIS — C9002 Multiple myeloma in relapse: Secondary | ICD-10-CM | POA: Diagnosis not present

## 2018-03-17 LAB — KAPPA/LAMBDA LIGHT CHAINS
KAPPA FREE LGHT CHN: 195.3 mg/L — AB (ref 3.3–19.4)
KAPPA, LAMDA LIGHT CHAIN RATIO: 18.6 — AB (ref 0.26–1.65)
Lambda free light chains: 10.5 mg/L (ref 5.7–26.3)

## 2018-03-17 MED FILL — LISINOPRIL 40 MG TABLET: 40 | 30 days supply | Qty: 30 | Fill #3

## 2018-03-18 DIAGNOSIS — C9002 Multiple myeloma in relapse: Secondary | ICD-10-CM | POA: Diagnosis not present

## 2018-03-18 LAB — MULTIPLE MYELOMA PANEL, SERUM
ALBUMIN SERPL ELPH-MCNC: 3.3 g/dL (ref 2.9–4.4)
ALBUMIN/GLOB SERPL: 1.2 (ref 0.7–1.7)
Alpha 1: 0.3 g/dL (ref 0.0–0.4)
Alpha2 Glob SerPl Elph-Mcnc: 0.9 g/dL (ref 0.4–1.0)
B-GLOBULIN SERPL ELPH-MCNC: 1.2 g/dL (ref 0.7–1.3)
GAMMA GLOB SERPL ELPH-MCNC: 0.5 g/dL (ref 0.4–1.8)
GLOBULIN, TOTAL: 2.9 g/dL (ref 2.2–3.9)
IgA: 248 mg/dL (ref 64–422)
IgG (Immunoglobin G), Serum: 638 mg/dL — ABNORMAL LOW (ref 700–1600)
IgM (Immunoglobulin M), Srm: 9 mg/dL — ABNORMAL LOW (ref 26–217)
M PROTEIN SERPL ELPH-MCNC: 0.3 g/dL — AB
Total Protein ELP: 6.2 g/dL (ref 6.0–8.5)

## 2018-03-19 DIAGNOSIS — C9002 Multiple myeloma in relapse: Secondary | ICD-10-CM | POA: Diagnosis not present

## 2018-03-20 DIAGNOSIS — C9002 Multiple myeloma in relapse: Secondary | ICD-10-CM | POA: Diagnosis not present

## 2018-03-21 DIAGNOSIS — C9002 Multiple myeloma in relapse: Secondary | ICD-10-CM | POA: Diagnosis not present

## 2018-03-22 DIAGNOSIS — C9002 Multiple myeloma in relapse: Secondary | ICD-10-CM | POA: Diagnosis not present

## 2018-03-23 ENCOUNTER — Telehealth: Payer: Self-pay | Admitting: *Deleted

## 2018-03-23 DIAGNOSIS — C9002 Multiple myeloma in relapse: Secondary | ICD-10-CM | POA: Diagnosis not present

## 2018-03-23 NOTE — Telephone Encounter (Signed)
"  [place] calling in reference to fax sent 03-20-2018 for Revlimid to [phone].  Was fax received and is request in process?"  Unable to respond or confirm request.  Provider folder empty.  Most recent Revlimid refill per EMR dated April 03-2018.  Transferred to collaborative to assist with this request.

## 2018-03-24 DIAGNOSIS — C9002 Multiple myeloma in relapse: Secondary | ICD-10-CM | POA: Diagnosis not present

## 2018-03-25 DIAGNOSIS — C9002 Multiple myeloma in relapse: Secondary | ICD-10-CM | POA: Diagnosis not present

## 2018-03-26 DIAGNOSIS — C9002 Multiple myeloma in relapse: Secondary | ICD-10-CM | POA: Diagnosis not present

## 2018-03-27 DIAGNOSIS — C9002 Multiple myeloma in relapse: Secondary | ICD-10-CM | POA: Diagnosis not present

## 2018-03-28 DIAGNOSIS — C9002 Multiple myeloma in relapse: Secondary | ICD-10-CM | POA: Diagnosis not present

## 2018-03-29 DIAGNOSIS — C9002 Multiple myeloma in relapse: Secondary | ICD-10-CM | POA: Diagnosis not present

## 2018-03-30 ENCOUNTER — Inpatient Hospital Stay: Payer: Medicare HMO | Attending: Hematology and Oncology

## 2018-03-30 ENCOUNTER — Telehealth: Payer: Self-pay | Admitting: Hematology and Oncology

## 2018-03-30 ENCOUNTER — Inpatient Hospital Stay: Payer: Medicare HMO

## 2018-03-30 ENCOUNTER — Encounter: Payer: Self-pay | Admitting: Hematology and Oncology

## 2018-03-30 ENCOUNTER — Inpatient Hospital Stay (HOSPITAL_BASED_OUTPATIENT_CLINIC_OR_DEPARTMENT_OTHER): Payer: Medicare HMO | Admitting: Hematology and Oncology

## 2018-03-30 DIAGNOSIS — Z5111 Encounter for antineoplastic chemotherapy: Secondary | ICD-10-CM | POA: Diagnosis not present

## 2018-03-30 DIAGNOSIS — C9002 Multiple myeloma in relapse: Secondary | ICD-10-CM | POA: Insufficient documentation

## 2018-03-30 DIAGNOSIS — Z7982 Long term (current) use of aspirin: Secondary | ICD-10-CM | POA: Insufficient documentation

## 2018-03-30 DIAGNOSIS — F0390 Unspecified dementia without behavioral disturbance: Secondary | ICD-10-CM | POA: Insufficient documentation

## 2018-03-30 DIAGNOSIS — Z79899 Other long term (current) drug therapy: Secondary | ICD-10-CM | POA: Diagnosis not present

## 2018-03-30 DIAGNOSIS — G893 Neoplasm related pain (acute) (chronic): Secondary | ICD-10-CM | POA: Insufficient documentation

## 2018-03-30 DIAGNOSIS — C9000 Multiple myeloma not having achieved remission: Secondary | ICD-10-CM

## 2018-03-30 DIAGNOSIS — Z9221 Personal history of antineoplastic chemotherapy: Secondary | ICD-10-CM

## 2018-03-30 DIAGNOSIS — L03818 Cellulitis of other sites: Secondary | ICD-10-CM | POA: Diagnosis not present

## 2018-03-30 DIAGNOSIS — L0390 Cellulitis, unspecified: Secondary | ICD-10-CM | POA: Insufficient documentation

## 2018-03-30 DIAGNOSIS — C9001 Multiple myeloma in remission: Secondary | ICD-10-CM

## 2018-03-30 LAB — CBC WITH DIFFERENTIAL/PLATELET
BASOS ABS: 0.1 10*3/uL (ref 0.0–0.1)
BASOS PCT: 2 %
EOS ABS: 0.2 10*3/uL (ref 0.0–0.5)
EOS PCT: 4 %
HCT: 39.5 % (ref 34.8–46.6)
HEMOGLOBIN: 12.4 g/dL (ref 11.6–15.9)
LYMPHS ABS: 2.3 10*3/uL (ref 0.9–3.3)
Lymphocytes Relative: 50 %
MCH: 30.8 pg (ref 25.1–34.0)
MCHC: 31.4 g/dL — ABNORMAL LOW (ref 31.5–36.0)
MCV: 98.3 fL (ref 79.5–101.0)
Monocytes Absolute: 0.4 10*3/uL (ref 0.1–0.9)
Monocytes Relative: 9 %
Neutro Abs: 1.6 10*3/uL (ref 1.5–6.5)
Neutrophils Relative %: 35 %
PLATELETS: 142 10*3/uL — AB (ref 145–400)
RBC: 4.02 MIL/uL (ref 3.70–5.45)
RDW: 16.6 % — ABNORMAL HIGH (ref 11.2–14.5)
WBC: 4.6 10*3/uL (ref 3.9–10.3)

## 2018-03-30 LAB — COMPREHENSIVE METABOLIC PANEL
ALBUMIN: 3.4 g/dL — AB (ref 3.5–5.0)
ALK PHOS: 62 U/L (ref 40–150)
ALT: 18 U/L (ref 0–55)
AST: 17 U/L (ref 5–34)
Anion gap: 7 (ref 3–11)
BUN: 11 mg/dL (ref 7–26)
CALCIUM: 8.6 mg/dL (ref 8.4–10.4)
CHLORIDE: 108 mmol/L (ref 98–109)
CO2: 28 mmol/L (ref 22–29)
CREATININE: 0.78 mg/dL (ref 0.60–1.10)
GFR calc non Af Amer: >60 mL/min (ref >60)
GLUCOSE: 87 mg/dL (ref 70–140)
Potassium: 4.2 mmol/L (ref 3.5–5.1)
SODIUM: 143 mmol/L (ref 136–145)
Total Bilirubin: 0.6 mg/dL (ref 0.2–1.2)
Total Protein: 6.3 g/dL — ABNORMAL LOW (ref 6.4–8.3)

## 2018-03-30 MED ORDER — PROCHLORPERAZINE MALEATE 10 MG PO TABS
ORAL_TABLET | ORAL | Status: AC
  Filled 2018-03-30: qty 1

## 2018-03-30 MED ORDER — PROCHLORPERAZINE MALEATE 10 MG PO TABS
10.0000 mg | ORAL_TABLET | Freq: Once | ORAL | Status: AC
  Administered 2018-03-30: 10 mg via ORAL

## 2018-03-30 MED ORDER — BORTEZOMIB CHEMO SQ INJECTION 3.5 MG (2.5MG/ML)
0.9750 mg/m2 | Freq: Once | INTRAMUSCULAR | Status: AC
  Administered 2018-03-30: 1.5 mg via SUBCUTANEOUS
  Filled 2018-03-30: qty 1.5

## 2018-03-30 MED ORDER — AMOXICILLIN 500 MG PO TABS: 500.0000 mg | ORAL_TABLET | Freq: Two times a day (BID) | ORAL | 0 refills | Status: DC

## 2018-03-30 MED ORDER — ZOLEDRONIC ACID 4 MG/5ML IV CONC
3.5000 mg | Freq: Once | INTRAVENOUS | Status: AC
  Administered 2018-03-30: 3.5 mg via INTRAVENOUS
  Filled 2018-03-30: qty 4.38

## 2018-03-30 MED FILL — AMOXICILLIN 500 MG CAPSULE: 500 | 10 days supply | Qty: 20 | Fill #0

## 2018-03-30 NOTE — Assessment & Plan Note (Signed)
Clinically, she has worsening signs of dementia She has appointment to see neurologist next month for further follow-up I do not plan to escalate her treatment due to perceived worsening dementia

## 2018-03-30 NOTE — Telephone Encounter (Signed)
Gave pt avs and calendar with appts per 5/14 los.

## 2018-03-30 NOTE — Assessment & Plan Note (Signed)
She has signs of cellulitis I recommend antibiotic treatment She does not need to hold treatment today I have educated the family to call me if the cellulitis is not improving with oral antibiotics

## 2018-03-30 NOTE — Assessment & Plan Note (Signed)
She has worsening back pain with radicular pain which raised suspicion for possible sciatica type nerve pain rather than cancer associated pain We discussed the risk and benefit of imaging studies recently but they declined Ultimately, her family members are in agreement to schedule long-acting pain medicine twice a day along with her breakthrough pain medicine as needed So far, her pain is stable

## 2018-03-30 NOTE — Progress Notes (Signed)
[place] OFFICE PROGRESS NOTE  Patient Care Team: Susy Frizzle, MD as PCP - General (Family Medicine)  ASSESSMENT & PLAN:  Multiple myeloma in relapse [facility]) Recent myeloma panel showed near complete response to treatment but with mildly elevated light chains We have recently increased the dose of Revlimid. She is frail and weak.  I do not feel comfortable increasing the dose of Revlimid further She will continue current prescription Revlimid along with Velcade every other week She will continue calcium with vitamin D supplement and Zometa every 3 months She will continue acyclovir for antimicrobial prophylaxis and aspirin for DVT prophylaxis  Cancer associated pain She has worsening back pain with radicular pain which raised suspicion for possible sciatica type nerve pain rather than cancer associated pain We discussed the risk and benefit of imaging studies recently but they declined Ultimately, her family members are in agreement to schedule long-acting pain medicine twice a day along with her breakthrough pain medicine as needed So far, her pain is stable  Dementia Clinically, she has worsening signs of dementia She has appointment to see neurologist next month for further follow-up I do not plan to escalate her treatment due to perceived worsening dementia  Cellulitis She has signs of cellulitis I recommend antibiotic treatment She does not need to hold treatment today I have educated the family to call me if the cellulitis is not improving with oral antibiotics   No orders of the defined types were placed in this encounter.   INTERVAL HISTORY: Please see below for problem oriented charting. She returns for further follow-up with her sisters She had new onset of cellulitis affecting the left wrist that the family just noted a few days ago There were no reported fever or chills She has pressure sores on her back She denies worsening pain No recent  history of fall; the family noted worsening dementia Her appetite is stable without recent weight change She have chronic bilateral lower extremity edema due to poor mobility and fluid retention from steroids, stable The patient denies worsening peripheral neuropathy  SUMMARY OF ONCOLOGIC HISTORY:   Multiple myeloma in relapse ([place])   01/09/2016 Imaging    MRI back showed Interval development of diffuse bone marrow infiltration by innumerable small lesions most consistent with multiple myeloma      01/23/2016 Bone Marrow Biopsy    Accession: [id number] Bone marrow biopsy showed 51% plasma cells      01/23/2016 Imaging    Skeletal survey showed lytic lesions      01/23/2016 Pathology Results    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 Revlimid was stopped in July and resumed in October      04/01/2016 Adverse Reaction    Treatment was placed on hold temporarily due to neutropenia       REVIEW OF SYSTEMS:   Constitutional: Denies fevers, chills or abnormal weight loss Eyes: Denies blurriness of vision Ears, nose, mouth, throat, and face: Denies mucositis or sore throat Respiratory: Denies cough, dyspnea or wheezes Cardiovascular: Denies palpitation, chest discomfort Gastrointestinal:  Denies nausea, heartburn or change in bowel habits Lymphatics: Denies new lymphadenopathy or easy bruising Neurological:Denies numbness, tingling or new weaknesses Behavioral/Psych: Mood is stable, no new changes  All other systems were reviewed with the patient and are negative.  I have reviewed the past medical history, past surgical history, social history and family  history with the patient and they are unchanged from previous note.  ALLERGIES:  is allergic to aspirin; pravastatin; and zocor [simvastatin].  MEDICATIONS:  Current Outpatient Medications  Medication Sig Dispense Refill  . acyclovir  (ZOVIRAX) 400 MG tablet Take 1 tablet (400 mg total) by mouth daily. 90 tablet 11  . amoxicillin (AMOXIL) 500 MG tablet Take 1 tablet (500 mg total) by mouth 2 (two) times daily. 20 tablet 0  . aspirin EC 81 MG tablet Take 81 mg by mouth daily.    . bortezomib IV (VELCADE) 3.5 MG injection Inject 1.3 mg/m2 into the vein once. Once every other week    . Cholecalciferol (VITAMIN D PO) Take by mouth.    . dexamethasone (DECADRON) 1 MG tablet Take 1 tablet (1 mg total) by mouth daily with breakfast. 90 tablet 1  . lenalidomide (REVLIMID) 15 MG capsule Take 1 every day by mouth for 21 days, then off 7 days 21 capsule 11  . lisinopril (PRINIVIL,ZESTRIL) 40 MG tablet TAKE 1 TABLET BY MOUTH ONCE DAILY 30 tablet 11  . loperamide (IMODIUM) 2 MG capsule Take 1 capsule (2 mg total) by mouth 4 (four) times daily as needed for diarrhea or loose stools. 12 capsule 0  . morphine (MS CONTIN) 15 MG 12 hr tablet Take 1 tablet (15 mg total) by mouth every 12 (twelve) hours. 60 tablet 0  . oxyCODONE-acetaminophen (ROXICET) 5-325 MG tablet Take 1 tablet by mouth every 6 (six) hours as needed for severe pain. 90 tablet 0   No current facility-administered medications for this visit.     PHYSICAL EXAMINATION: ECOG PERFORMANCE STATUS: 1 - Symptomatic but completely ambulatory  Vitals:   03/30/18 0851  BP: (!) 156/79  Pulse: 67  Resp: 18  Temp: 97.6 F (36.4 C)  SpO2: 98%   Filed Weights   03/30/18 0851  Weight: 152 lb 3.2 oz (69 kg)    GENERAL:alert, no distress and comfortable SKIN: Noted mild cellulitis on the left wrist.  No signs of bleeding.  Noted multiple pressure sores on her back EYES: normal, Conjunctiva are pink and non-injected, sclera clear OROPHARYNX:no exudate, no erythema and lips, buccal mucosa, and tongue normal  NECK: supple, thyroid normal size, non-tender, without nodularity LYMPH:  no palpable lymphadenopathy in the cervical, axillary or inguinal LUNGS: clear to auscultation and  percussion with normal breathing effort HEART: regular rate & rhythm and no murmurs and no lower extremity edema ABDOMEN:abdomen soft, non-tender and normal bowel sounds Musculoskeletal:no cyanosis of digits and no clubbing  NEURO: alert & oriented x 3 with fluent speech, no focal motor/sensory deficits  LABORATORY DATA:  I have reviewed the data as listed    Component Value Date/Time   NA 143 03/30/2018 0823   NA 145 11/03/2017 0930   K 4.2 03/30/2018 0823   K 3.6 11/03/2017 0930   CL 108 03/30/2018 0823   CO2 28 03/30/2018 0823   CO2 27 11/03/2017 0930   GLUCOSE 87 03/30/2018 0823   GLUCOSE 112 11/03/2017 0930   BUN 11 03/30/2018 0823   BUN 11 12/08/2017 1551   BUN 13.4 11/03/2017 0930   CREATININE 0.78 03/30/2018 0823   CREATININE 0.9 11/03/2017 0930   CALCIUM 8.6 03/30/2018 0823   CALCIUM 9.4 11/03/2017 0930   PROT 6.3 (L) 03/30/2018 0823   PROT 6.2 11/03/2017 0930   PROT 6.9 11/03/2017 0930   ALBUMIN 3.4 (L) 03/30/2018 0823   ALBUMIN 3.2 (L) 11/03/2017 0930   AST 17 03/30/2018 8110  AST 20 11/03/2017 0930   ALT 18 03/30/2018 0823   ALT 18 11/03/2017 0930   ALKPHOS 62 03/30/2018 0823   ALKPHOS 84 11/03/2017 0930   BILITOT 0.6 03/30/2018 0823   BILITOT 0.64 11/03/2017 0930   GFRNONAA >60 03/30/2018 0823   GFRNONAA 64 01/17/2016 0910   GFRAA >60 03/30/2018 0823   GFRAA 74 01/17/2016 0910    No results found for: SPEP, UPEP  Lab Results  Component Value Date   WBC 4.6 03/30/2018   NEUTROABS 1.6 03/30/2018   HGB 12.4 03/30/2018   HCT 39.5 03/30/2018   MCV 98.3 03/30/2018   PLT 142 (L) 03/30/2018      Chemistry      Component Value Date/Time   NA 143 03/30/2018 0823   NA 145 11/03/2017 0930   K 4.2 03/30/2018 0823   K 3.6 11/03/2017 0930   CL 108 03/30/2018 0823   CO2 28 03/30/2018 0823   CO2 27 11/03/2017 0930   BUN 11 03/30/2018 0823   BUN 11 12/08/2017 1551   BUN 13.4 11/03/2017 0930   CREATININE 0.78 03/30/2018 0823   CREATININE 0.9  11/03/2017 0930      Component Value Date/Time   CALCIUM 8.6 03/30/2018 0823   CALCIUM 9.4 11/03/2017 0930   ALKPHOS 62 03/30/2018 0823   ALKPHOS 84 11/03/2017 0930   AST 17 03/30/2018 0823   AST 20 11/03/2017 0930   ALT 18 03/30/2018 0823   ALT 18 11/03/2017 0930   BILITOT 0.6 03/30/2018 0823   BILITOT 0.64 11/03/2017 0930       All questions were answered. The patient knows to call the clinic with any problems, questions or concerns. No barriers to learning was detected.  I spent 20 minutes counseling the patient face to face. The total time spent in the appointment was 25 minutes and more than 50% was on counseling and review of test results  Heath Lark, MD 03/30/2018 10:34 AM

## 2018-03-30 NOTE — Assessment & Plan Note (Signed)
Recent myeloma panel showed near complete response to treatment but with mildly elevated light chains We have recently increased the dose of Revlimid. She is frail and weak.  I do not feel comfortable increasing the dose of Revlimid further She will continue current prescription Revlimid along with Velcade every other week She will continue calcium with vitamin D supplement and Zometa every 3 months She will continue acyclovir for antimicrobial prophylaxis and aspirin for DVT prophylaxis

## 2018-03-30 NOTE — Patient Instructions (Signed)
[facility] Discharge Instructions for Patients Receiving Chemotherapy  Today you received the following chemotherapy agents velcade  To help prevent nausea and vomiting after your treatment, we encourage you to take your nausea medication as directed.   If you develop nausea and vomiting that is not controlled by your nausea medication, call the clinic.   BELOW ARE SYMPTOMS THAT SHOULD BE REPORTED IMMEDIATELY:  *FEVER GREATER THAN 100.5 F  *CHILLS WITH OR WITHOUT FEVER  NAUSEA AND VOMITING THAT IS NOT CONTROLLED WITH YOUR NAUSEA MEDICATION  *UNUSUAL SHORTNESS OF BREATH  *UNUSUAL BRUISING OR BLEEDING  TENDERNESS IN MOUTH AND THROAT WITH OR WITHOUT PRESENCE OF ULCERS  *URINARY PROBLEMS  *BOWEL PROBLEMS  UNUSUAL RASH Items with * indicate a potential emergency and should be followed up as soon as possible.  Feel free to call the clinic should you have any questions or concerns. The clinic phone number is [phone].  Please show the CHEMO ALERT CARD at check-in to the Emergency Department and triage nurse.   

## 2018-03-31 ENCOUNTER — Other Ambulatory Visit: Payer: Self-pay | Admitting: *Deleted

## 2018-03-31 DIAGNOSIS — C9002 Multiple myeloma in relapse: Secondary | ICD-10-CM | POA: Diagnosis not present

## 2018-03-31 MED ORDER — LENALIDOMIDE 15 MG PO CAPS: ORAL_CAPSULE | ORAL | 11 refills | Status: DC

## 2018-04-01 DIAGNOSIS — C9002 Multiple myeloma in relapse: Secondary | ICD-10-CM | POA: Diagnosis not present

## 2018-04-02 DIAGNOSIS — C9002 Multiple myeloma in relapse: Secondary | ICD-10-CM | POA: Diagnosis not present

## 2018-04-03 DIAGNOSIS — C9002 Multiple myeloma in relapse: Secondary | ICD-10-CM | POA: Diagnosis not present

## 2018-04-04 DIAGNOSIS — C9002 Multiple myeloma in relapse: Secondary | ICD-10-CM | POA: Diagnosis not present

## 2018-04-05 DIAGNOSIS — C9002 Multiple myeloma in relapse: Secondary | ICD-10-CM | POA: Diagnosis not present

## 2018-04-06 DIAGNOSIS — C9002 Multiple myeloma in relapse: Secondary | ICD-10-CM | POA: Diagnosis not present

## 2018-04-07 DIAGNOSIS — C9002 Multiple myeloma in relapse: Secondary | ICD-10-CM | POA: Diagnosis not present

## 2018-04-08 DIAGNOSIS — C9002 Multiple myeloma in relapse: Secondary | ICD-10-CM | POA: Diagnosis not present

## 2018-04-09 DIAGNOSIS — C9002 Multiple myeloma in relapse: Secondary | ICD-10-CM | POA: Diagnosis not present

## 2018-04-10 DIAGNOSIS — C9002 Multiple myeloma in relapse: Secondary | ICD-10-CM | POA: Diagnosis not present

## 2018-04-11 DIAGNOSIS — C9002 Multiple myeloma in relapse: Secondary | ICD-10-CM | POA: Diagnosis not present

## 2018-04-12 DIAGNOSIS — C9002 Multiple myeloma in relapse: Secondary | ICD-10-CM | POA: Diagnosis not present

## 2018-04-13 ENCOUNTER — Inpatient Hospital Stay: Payer: Medicare HMO

## 2018-04-13 VITALS — BP 152/81 | HR 70 | Temp 98.4°F | Resp 18

## 2018-04-13 DIAGNOSIS — G893 Neoplasm related pain (acute) (chronic): Secondary | ICD-10-CM | POA: Diagnosis not present

## 2018-04-13 DIAGNOSIS — Z7982 Long term (current) use of aspirin: Secondary | ICD-10-CM | POA: Diagnosis not present

## 2018-04-13 DIAGNOSIS — C9002 Multiple myeloma in relapse: Secondary | ICD-10-CM | POA: Diagnosis not present

## 2018-04-13 DIAGNOSIS — Z79899 Other long term (current) drug therapy: Secondary | ICD-10-CM | POA: Diagnosis not present

## 2018-04-13 DIAGNOSIS — Z5111 Encounter for antineoplastic chemotherapy: Secondary | ICD-10-CM | POA: Diagnosis not present

## 2018-04-13 DIAGNOSIS — Z9221 Personal history of antineoplastic chemotherapy: Secondary | ICD-10-CM | POA: Diagnosis not present

## 2018-04-13 DIAGNOSIS — C9001 Multiple myeloma in remission: Secondary | ICD-10-CM

## 2018-04-13 DIAGNOSIS — F0390 Unspecified dementia without behavioral disturbance: Secondary | ICD-10-CM | POA: Diagnosis not present

## 2018-04-13 DIAGNOSIS — L03818 Cellulitis of other sites: Secondary | ICD-10-CM | POA: Diagnosis not present

## 2018-04-13 LAB — CBC WITH DIFFERENTIAL/PLATELET
BASOS ABS: 0 10*3/uL (ref 0.0–0.1)
Basophils Relative: 0 %
Eosinophils Absolute: 0.2 10*3/uL (ref 0.0–0.5)
Eosinophils Relative: 4 %
HEMATOCRIT: 37.6 % (ref 34.8–46.6)
Hemoglobin: 12.2 g/dL (ref 11.6–15.9)
LYMPHS ABS: 1.4 10*3/uL (ref 0.9–3.3)
Lymphocytes Relative: 37 %
MCH: 31 pg (ref 25.1–34.0)
MCHC: 32.4 g/dL (ref 31.5–36.0)
MCV: 95.7 fL (ref 79.5–101.0)
MONO ABS: 0.2 10*3/uL (ref 0.1–0.9)
Monocytes Relative: 6 %
NEUTROS ABS: 1.9 10*3/uL (ref 1.5–6.5)
Neutrophils Relative %: 53 %
Platelets: 171 10*3/uL (ref 145–400)
RBC: 3.93 MIL/uL (ref 3.70–5.45)
RDW: 17.9 % — AB (ref 11.2–14.5)
WBC: 3.7 10*3/uL — ABNORMAL LOW (ref 3.9–10.3)

## 2018-04-13 LAB — COMPREHENSIVE METABOLIC PANEL
ALT: 20 U/L (ref 14–54)
AST: 27 U/L (ref 15–41)
Albumin: 3.6 g/dL (ref 3.5–5.0)
Alkaline Phosphatase: 62 U/L (ref 38–126)
Anion gap: 12 (ref 5–15)
BILIRUBIN TOTAL: 0.2 mg/dL — AB (ref 0.3–1.2)
BUN: 12 mg/dL (ref 6–20)
CO2: 26 mmol/L (ref 22–32)
CREATININE: 0.69 mg/dL (ref 0.44–1.00)
Calcium: 8.8 mg/dL — ABNORMAL LOW (ref 8.9–10.3)
Chloride: 105 mmol/L (ref 101–111)
Glucose, Bld: 89 mg/dL (ref 65–99)
Potassium: 3.4 mmol/L — ABNORMAL LOW (ref 3.5–5.1)
Sodium: 143 mmol/L (ref 135–145)
TOTAL PROTEIN: 6.6 g/dL (ref 6.5–8.1)

## 2018-04-13 MED ORDER — BORTEZOMIB CHEMO SQ INJECTION 3.5 MG (2.5MG/ML)
0.9750 mg/m2 | Freq: Once | INTRAMUSCULAR | Status: AC
  Administered 2018-04-13: 1.5 mg via SUBCUTANEOUS
  Filled 2018-04-13: qty 1.5

## 2018-04-13 MED ORDER — PROCHLORPERAZINE MALEATE 10 MG PO TABS
10.0000 mg | ORAL_TABLET | Freq: Once | ORAL | Status: AC
  Administered 2018-04-13: 10 mg via ORAL

## 2018-04-13 MED ORDER — PROCHLORPERAZINE MALEATE 10 MG PO TABS
ORAL_TABLET | ORAL | Status: AC
  Filled 2018-04-13: qty 1

## 2018-04-13 NOTE — Patient Instructions (Signed)
[facility] Discharge Instructions for Patients Receiving Chemotherapy  Today you received the following chemotherapy agents Velcade.  To help prevent nausea and vomiting after your treatment, we encourage you to take your nausea medication as directed.  If you develop nausea and vomiting that is not controlled by your nausea medication, call the clinic.   BELOW ARE SYMPTOMS THAT SHOULD BE REPORTED IMMEDIATELY:  *FEVER GREATER THAN 100.5 F  *CHILLS WITH OR WITHOUT FEVER  NAUSEA AND VOMITING THAT IS NOT CONTROLLED WITH YOUR NAUSEA MEDICATION  *UNUSUAL SHORTNESS OF BREATH  *UNUSUAL BRUISING OR BLEEDING  TENDERNESS IN MOUTH AND THROAT WITH OR WITHOUT PRESENCE OF ULCERS  *URINARY PROBLEMS  *BOWEL PROBLEMS  UNUSUAL RASH Items with * indicate a potential emergency and should be followed up as soon as possible.  Feel free to call the clinic should you have any questions or concerns. The clinic phone number is [phone].  Please show the CHEMO ALERT CARD at check-in to the Emergency Department and triage nurse.   

## 2018-04-14 DIAGNOSIS — C9002 Multiple myeloma in relapse: Secondary | ICD-10-CM | POA: Diagnosis not present

## 2018-04-14 LAB — KAPPA/LAMBDA LIGHT CHAINS
KAPPA FREE LGHT CHN: 330.4 mg/L — AB (ref 3.3–19.4)
KAPPA, LAMDA LIGHT CHAIN RATIO: 40.79 — AB (ref 0.26–1.65)
LAMDA FREE LIGHT CHAINS: 8.1 mg/L (ref 5.7–26.3)

## 2018-04-15 DIAGNOSIS — C9002 Multiple myeloma in relapse: Secondary | ICD-10-CM | POA: Diagnosis not present

## 2018-04-16 DIAGNOSIS — C9002 Multiple myeloma in relapse: Secondary | ICD-10-CM | POA: Diagnosis not present

## 2018-04-17 DIAGNOSIS — C9002 Multiple myeloma in relapse: Secondary | ICD-10-CM | POA: Diagnosis not present

## 2018-04-18 DIAGNOSIS — C9002 Multiple myeloma in relapse: Secondary | ICD-10-CM | POA: Diagnosis not present

## 2018-04-18 LAB — MULTIPLE MYELOMA PANEL, SERUM
ALBUMIN SERPL ELPH-MCNC: 3.3 g/dL (ref 2.9–4.4)
Albumin/Glob SerPl: 1.3 (ref 0.7–1.7)
Alpha 1: 0.2 g/dL (ref 0.0–0.4)
Alpha2 Glob SerPl Elph-Mcnc: 0.8 g/dL (ref 0.4–1.0)
B-Globulin SerPl Elph-Mcnc: 1.1 g/dL (ref 0.7–1.3)
Gamma Glob SerPl Elph-Mcnc: 0.4 g/dL (ref 0.4–1.8)
Globulin, Total: 2.6 g/dL (ref 2.2–3.9)
IGA: 246 mg/dL (ref 64–422)
IGM (IMMUNOGLOBULIN M), SRM: 14 mg/dL — AB (ref 26–217)
IgG (Immunoglobin G), Serum: 611 mg/dL — ABNORMAL LOW (ref 700–1600)
M Protein SerPl Elph-Mcnc: 0.2 g/dL — ABNORMAL HIGH
TOTAL PROTEIN ELP: 5.9 g/dL — AB (ref 6.0–8.5)

## 2018-04-19 DIAGNOSIS — C9002 Multiple myeloma in relapse: Secondary | ICD-10-CM | POA: Diagnosis not present

## 2018-04-20 ENCOUNTER — Ambulatory Visit (INDEPENDENT_AMBULATORY_CARE_PROVIDER_SITE_OTHER): Payer: Medicare HMO | Admitting: Neurology

## 2018-04-20 ENCOUNTER — Encounter: Payer: Self-pay | Admitting: Neurology

## 2018-04-20 VITALS — BP 106/68 | HR 82 | Ht 62.0 in | Wt 151.0 lb

## 2018-04-20 DIAGNOSIS — F0390 Unspecified dementia without behavioral disturbance: Secondary | ICD-10-CM | POA: Diagnosis not present

## 2018-04-20 DIAGNOSIS — C9002 Multiple myeloma in relapse: Secondary | ICD-10-CM | POA: Diagnosis not present

## 2018-04-20 DIAGNOSIS — F03B0 Unspecified dementia, moderate, without behavioral disturbance, psychotic disturbance, mood disturbance, and anxiety: Secondary | ICD-10-CM

## 2018-04-20 MED ORDER — DONEPEZIL HCL 10 MG PO TABS: ORAL_TABLET | ORAL | 11 refills | Status: DC

## 2018-04-20 MED FILL — DONEPEZIL HCL 10 MG TABLET: 10 | 30 days supply | Qty: 30 | Fill #0
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
## 2018-04-20 NOTE — Patient Instructions (Signed)
1. Start Donepezil 10mg : Take 1/2 tablet daily for 2 weeks, then increase to 1 tablet daily 2. Continue monitoring home safety and increased home supervision 3. Follow-up in 6 months, call for any changes  FALL PRECAUTIONS: Be cautious when walking. Scan the area for obstacles that may increase the risk of trips and falls. When getting up in the mornings, sit up at the edge of the bed for a few minutes before getting out of bed. Consider elevating the bed at the head end to avoid drop of blood pressure when getting up. Walk always in a well-lit room (use night lights in the walls). Avoid area rugs or power cords from appliances in the middle of the walkways. Use a walker or a cane if necessary and consider physical therapy for balance exercise. Get your eyesight checked regularly.  FINANCIAL OVERSIGHT: Supervision, especially oversight when making financial decisions or transactions is also recommended.  HOME SAFETY: Consider the safety of the kitchen when operating appliances like stoves, microwave oven, and blender. Consider having supervision and share cooking responsibilities until no longer able to participate in those. Accidents with firearms and other hazards in the house should be identified and addressed as well.  DRIVING: Regarding driving, in patients with progressive memory problems, driving will be impaired. We advise to have someone else do the driving if trouble finding directions or if minor accidents are reported. Independent driving assessment is available to determine safety of driving.  ABILITY TO BE LEFT ALONE: If patient is unable to contact 911 operator, consider using LifeLine, or when the need is there, arrange for someone to stay with patients. Smoking is a fire hazard, consider supervision or cessation. Risk of wandering should be assessed by caregiver and if detected at any point, supervision and safe proof recommendations should be instituted.  MEDICATION SUPERVISION:  Inability to self-administer medication needs to be constantly addressed. Implement a mechanism to ensure safe administration of the medications.  RECOMMENDATIONS FOR ALL PATIENTS WITH MEMORY PROBLEMS: 1. Continue to exercise (Recommend 30 minutes of walking everyday, or 3 hours every week) 2. Increase social interactions - continue going to [place] and enjoy social gatherings with friends and family 3. Eat healthy, avoid fried foods and eat more fruits and vegetables 4. Maintain adequate blood pressure, blood sugar, and blood cholesterol level. Reducing the risk of stroke and cardiovascular disease also helps promoting better memory. 5. Avoid stressful situations. Live a simple life and avoid aggravations. Organize your time and prepare for the next day in anticipation. 6. Sleep well, avoid any interruptions of sleep and avoid any distractions in the bedroom that may interfere with adequate sleep quality 7. Avoid sugar, avoid sweets as there is a strong link between excessive sugar intake, diabetes, and cognitive impairment The Mediterranean diet has been shown to help patients reduce the risk of progressive memory disorders and reduces cardiovascular risk. This includes eating fish, eat fruits and green leafy vegetables, nuts like almonds and hazelnuts, walnuts, and also use olive oil. Avoid fast foods and fried foods as much as possible. Avoid sweets and sugar as sugar use has been linked to worsening of memory function.  There is always a concern of gradual progression of memory problems. If this is the case, then we may need to adjust level of care according to patient needs. Support, both to the patient and caregiver, should then be put into place.

## 2018-04-21 DIAGNOSIS — C9002 Multiple myeloma in relapse: Secondary | ICD-10-CM | POA: Diagnosis not present

## 2018-04-22 DIAGNOSIS — C9002 Multiple myeloma in relapse: Secondary | ICD-10-CM | POA: Diagnosis not present

## 2018-04-23 DIAGNOSIS — C9002 Multiple myeloma in relapse: Secondary | ICD-10-CM | POA: Diagnosis not present

## 2018-04-24 DIAGNOSIS — C9002 Multiple myeloma in relapse: Secondary | ICD-10-CM | POA: Diagnosis not present

## 2018-04-25 DIAGNOSIS — C9002 Multiple myeloma in relapse: Secondary | ICD-10-CM | POA: Diagnosis not present

## 2018-04-26 ENCOUNTER — Other Ambulatory Visit: Payer: Self-pay | Admitting: *Deleted

## 2018-04-26 DIAGNOSIS — C9002 Multiple myeloma in relapse: Secondary | ICD-10-CM | POA: Diagnosis not present

## 2018-04-26 MED ORDER — LENALIDOMIDE 15 MG PO CAPS: ORAL_CAPSULE | ORAL | 11 refills | Status: DC

## 2018-04-26 MED FILL — ACYCLOVIR 400 MG TABLET: 400 | 90 days supply | Qty: 90 | Fill #1

## 2018-04-27 ENCOUNTER — Inpatient Hospital Stay: Payer: Medicare HMO | Attending: Hematology and Oncology | Admitting: Hematology and Oncology

## 2018-04-27 ENCOUNTER — Inpatient Hospital Stay: Payer: Medicare HMO

## 2018-04-27 ENCOUNTER — Telehealth: Payer: Self-pay | Admitting: Hematology and Oncology

## 2018-04-27 ENCOUNTER — Encounter: Payer: Self-pay | Admitting: Hematology and Oncology

## 2018-04-27 DIAGNOSIS — G893 Neoplasm related pain (acute) (chronic): Secondary | ICD-10-CM

## 2018-04-27 DIAGNOSIS — Z79899 Other long term (current) drug therapy: Secondary | ICD-10-CM | POA: Diagnosis not present

## 2018-04-27 DIAGNOSIS — C9002 Multiple myeloma in relapse: Secondary | ICD-10-CM | POA: Diagnosis not present

## 2018-04-27 DIAGNOSIS — F0390 Unspecified dementia without behavioral disturbance: Secondary | ICD-10-CM | POA: Diagnosis not present

## 2018-04-27 DIAGNOSIS — D61818 Other pancytopenia: Secondary | ICD-10-CM | POA: Insufficient documentation

## 2018-04-27 DIAGNOSIS — R531 Weakness: Secondary | ICD-10-CM | POA: Insufficient documentation

## 2018-04-27 DIAGNOSIS — Z5111 Encounter for antineoplastic chemotherapy: Secondary | ICD-10-CM | POA: Diagnosis not present

## 2018-04-27 DIAGNOSIS — C9001 Multiple myeloma in remission: Secondary | ICD-10-CM

## 2018-04-27 LAB — CBC WITH DIFFERENTIAL/PLATELET
BASOS ABS: 0 10*3/uL (ref 0.0–0.1)
Basophils Relative: 1 %
Eosinophils Absolute: 0.3 10*3/uL (ref 0.0–0.5)
Eosinophils Relative: 9 %
HEMATOCRIT: 38.2 % (ref 34.8–46.6)
Hemoglobin: 12.6 g/dL (ref 11.6–15.9)
LYMPHS PCT: 34 %
Lymphs Abs: 1.2 10*3/uL (ref 0.9–3.3)
MCH: 31.1 pg (ref 25.1–34.0)
MCHC: 32.8 g/dL (ref 31.5–36.0)
MCV: 94.6 fL (ref 79.5–101.0)
MONO ABS: 0.3 10*3/uL (ref 0.1–0.9)
Monocytes Relative: 7 %
NEUTROS ABS: 1.7 10*3/uL (ref 1.5–6.5)
Neutrophils Relative %: 49 %
Platelets: 130 10*3/uL — ABNORMAL LOW (ref 145–400)
RBC: 4.04 MIL/uL (ref 3.70–5.45)
RDW: 17.9 % — ABNORMAL HIGH (ref 11.2–14.5)
WBC: 3.5 10*3/uL — ABNORMAL LOW (ref 3.9–10.3)

## 2018-04-27 LAB — COMPREHENSIVE METABOLIC PANEL
ALT: 19 U/L (ref 0–55)
AST: 22 U/L (ref 5–34)
Albumin: 3.6 g/dL (ref 3.5–5.0)
Alkaline Phosphatase: 81 U/L (ref 40–150)
Anion gap: 10 (ref 3–11)
BILIRUBIN TOTAL: 0.6 mg/dL (ref 0.2–1.2)
BUN: 10 mg/dL (ref 7–26)
CALCIUM: 8.8 mg/dL (ref 8.4–10.4)
CO2: 27 mmol/L (ref 22–29)
CREATININE: 0.76 mg/dL (ref 0.60–1.10)
Chloride: 107 mmol/L (ref 98–109)
GFR calc Af Amer: >60 mL/min (ref >60)
Glucose, Bld: 82 mg/dL (ref 70–140)
Potassium: 3.6 mmol/L (ref 3.5–5.1)
Sodium: 144 mmol/L (ref 136–145)
TOTAL PROTEIN: 6.6 g/dL (ref 6.4–8.3)

## 2018-04-27 MED ORDER — PROCHLORPERAZINE MALEATE 10 MG PO TABS
ORAL_TABLET | ORAL | Status: AC
  Filled 2018-04-27: qty 1

## 2018-04-27 MED ORDER — MORPHINE SULFATE ER 15 MG PO TBCR: 15.0000 mg | EXTENDED_RELEASE_TABLET | Freq: Two times a day (BID) | ORAL | 0 refills | Status: DC

## 2018-04-27 MED ORDER — BORTEZOMIB CHEMO SQ INJECTION 3.5 MG (2.5MG/ML)
0.9750 mg/m2 | Freq: Once | INTRAMUSCULAR | Status: AC
  Administered 2018-04-27: 1.5 mg via SUBCUTANEOUS
  Filled 2018-04-27: qty 0.6

## 2018-04-27 MED ORDER — PROCHLORPERAZINE MALEATE 10 MG PO TABS
10.0000 mg | ORAL_TABLET | Freq: Once | ORAL | Status: AC
Start: 2018-04-27 — End: 2018-04-27
  Administered 2018-04-27: 10 mg via ORAL

## 2018-04-27 NOTE — Assessment & Plan Note (Signed)
She has worsening back pain with radicular pain which raised suspicion for possible sciatica type nerve pain rather than cancer associated pain We discussed the risk and benefit of imaging studies recently but they declined Ultimately, her family members are in agreement to schedule long-acting pain medicine twice a day along with her breakthrough pain medicine as needed So far, her pain is stable

## 2018-04-27 NOTE — Assessment & Plan Note (Signed)
She has intermittent pancytopenia We will continue treatment as scheduled She does not need any further dose reduction

## 2018-04-27 NOTE — Assessment & Plan Note (Signed)
Recent myeloma panel showed near complete response to treatment but with mildly elevated light chains We have recently increased the dose of Revlimid. She is frail and weak.  I do not feel comfortable increasing the dose of Revlimid further She will continue current prescription Revlimid along with Velcade every other week She will continue calcium with vitamin D supplement and Zometa every 3 months She will continue acyclovir for antimicrobial prophylaxis and aspirin for DVT prophylaxis

## 2018-04-27 NOTE — Progress Notes (Signed)
[place] OFFICE PROGRESS NOTE  Patient Care Team: Susy Frizzle, MD as PCP - General (Family Medicine)  ASSESSMENT & PLAN:  Multiple myeloma in relapse [facility]) Recent myeloma panel showed near complete response to treatment but with mildly elevated light chains We have recently increased the dose of Revlimid. She is frail and weak.  I do not feel comfortable increasing the dose of Revlimid further She will continue current prescription Revlimid along with Velcade every other week She will continue calcium with vitamin D supplement and Zometa every 3 months She will continue acyclovir for antimicrobial prophylaxis and aspirin for DVT prophylaxis  Acquired pancytopenia ([place]) She has intermittent pancytopenia We will continue treatment as scheduled She does not need any further dose reduction  Cancer associated pain She has worsening back pain with radicular pain which raised suspicion for possible sciatica type nerve pain rather than cancer associated pain We discussed the risk and benefit of imaging studies recently but they declined Ultimately, her family members are in agreement to schedule long-acting pain medicine twice a day along with her breakthrough pain medicine as needed So far, her pain is stable  Dementia Clinically, she has worsening signs of dementia She has appointment to see neurologist I do not plan to escalate her treatment due to perceived worsening dementia   No orders of the defined types were placed in this encounter.   INTERVAL HISTORY: Please see below for problem oriented charting. She returns with her sisters She denies recent falls Back pain is stable No perceived peripheral neuropathy Denies recent infection The patient denies any recent signs or symptoms of bleeding such as spontaneous epistaxis, hematuria or hematochezia. 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 Revlimid was stopped in July and resumed in October      04/01/2016 Adverse Reaction    Treatment was placed on hold temporarily due to neutropenia       REVIEW OF SYSTEMS:   Constitutional: Denies fevers, chills or abnormal weight loss Eyes: Denies blurriness of vision Ears, nose, mouth, throat, and face: Denies mucositis or sore throat Respiratory: Denies cough, dyspnea or wheezes Cardiovascular: Denies palpitation, chest discomfort or lower extremity swelling Gastrointestinal:  Denies nausea, heartburn or change in bowel habits Skin: Denies abnormal skin rashes Lymphatics: Denies new lymphadenopathy or easy bruising Neurological:Denies numbness, tingling or new weaknesses Behavioral/Psych: Mood is stable, no new changes  All other systems were reviewed with the patient and are negative.  I have reviewed the past medical history, past surgical history, social history and family history with the patient and they are unchanged from previous note.  ALLERGIES:  is allergic to aspirin; pravastatin; and zocor [simvastatin].  MEDICATIONS:  Current Outpatient Medications  Medication Sig Dispense Refill  . acyclovir (ZOVIRAX) 400 MG tablet Take 1 tablet (400 mg total) by mouth daily. 90 tablet 11  . aspirin EC  81 MG tablet Take 81 mg by mouth daily.    . bortezomib IV (VELCADE) 3.5 MG injection Inject 1.3 mg/m2 into the vein once. Once every other week    . Cholecalciferol (VITAMIN D PO) Take by mouth.     . dexamethasone (DECADRON) 1 MG tablet Take 1 tablet (1 mg total) by mouth daily with breakfast. 90 tablet 1  . donepezil (ARICEPT) 10 MG tablet Take 1/2 tablet daily for 2 weeks, then increase to 1 tablet daily 30 tablet 11  . lenalidomide (REVLIMID) 15 MG capsule Take 1 every day by mouth for 21 days, then off 7 days 21 capsule 11  . lisinopril (PRINIVIL,ZESTRIL) 40 MG tablet TAKE 1 TABLET BY MOUTH ONCE DAILY 30 tablet 11  . loperamide (IMODIUM) 2 MG capsule Take 1 capsule (2 mg total) by mouth 4 (four) times daily as needed for diarrhea or loose stools. 12 capsule 0  . morphine (MS CONTIN) 15 MG 12 hr tablet Take 1 tablet (15 mg total) by mouth every 12 (twelve) hours. 60 tablet 0  . oxyCODONE-acetaminophen (ROXICET) 5-325 MG tablet Take 1 tablet by mouth every 6 (six) hours as needed for severe pain. 90 tablet 0   No current facility-administered medications for this visit.     PHYSICAL EXAMINATION: ECOG PERFORMANCE STATUS: 1 - Symptomatic but completely ambulatory  Vitals:   04/27/18 0933  BP: 137/67  Pulse: 62  Resp: 18  Temp: 98 F (36.7 C)  SpO2: 100%   Filed Weights   04/27/18 0933  Weight: 149 lb 9.6 oz (67.9 kg)    GENERAL:alert, no distress and comfortable SKIN: skin color, texture, turgor are normal, no rashes or significant lesions EYES: normal, Conjunctiva are pink and non-injected, sclera clear OROPHARYNX:no exudate, no erythema and lips, buccal mucosa, and tongue normal  NECK: supple, thyroid normal size, non-tender, without nodularity LYMPH:  no palpable lymphadenopathy in the cervical, axillary or inguinal LUNGS: clear to auscultation and percussion with normal breathing effort HEART: regular rate & rhythm and no murmurs and no lower extremity edema ABDOMEN:abdomen soft, non-tender and normal bowel sounds Musculoskeletal:no cyanosis of digits and no clubbing  NEURO: alert & oriented x 3 with fluent speech, no focal motor/sensory deficits  LABORATORY DATA:   I have reviewed the data as listed    Component Value Date/Time   NA 143 04/13/2018 0828   NA 145 11/03/2017 0930   K 3.4 (L) 04/13/2018 0828   K 3.6 11/03/2017 0930   CL 105 04/13/2018 0828   CO2 26 04/13/2018 0828   CO2 27 11/03/2017 0930   GLUCOSE 89 04/13/2018 0828   GLUCOSE 112 11/03/2017 0930   BUN 12 04/13/2018 0828   BUN 11 12/08/2017 1551   BUN 13.4 11/03/2017 0930   CREATININE 0.69 04/13/2018 0828   CREATININE 0.9 11/03/2017 0930   CALCIUM 8.8 (L) 04/13/2018 0828   CALCIUM 9.4 11/03/2017 0930   PROT 6.6 04/13/2018 0828   PROT 6.2 11/03/2017 0930   PROT 6.9 11/03/2017 0930   ALBUMIN 3.6 04/13/2018 0828   ALBUMIN 3.2 (L) 11/03/2017 0930   AST 27 04/13/2018 0828   AST 20 11/03/2017 0930   ALT 20 04/13/2018 0828   ALT 18 11/03/2017 0930   ALKPHOS 62 04/13/2018 0828   ALKPHOS 84 11/03/2017 0930   BILITOT 0.2 (L) 04/13/2018 0828   BILITOT 0.64 11/03/2017 0930   GFRNONAA >60 04/13/2018 0828   GFRNONAA 64 01/17/2016 0910   GFRAA >60 04/13/2018 0828   GFRAA 74 01/17/2016  5809    No results found for: SPEP, UPEP  Lab Results  Component Value Date   WBC 3.5 (L) 04/27/2018   NEUTROABS 1.7 04/27/2018   HGB 12.6 04/27/2018   HCT 38.2 04/27/2018   MCV 94.6 04/27/2018   PLT 130 (L) 04/27/2018      Chemistry      Component Value Date/Time   NA 143 04/13/2018 0828   NA 145 11/03/2017 0930   K 3.4 (L) 04/13/2018 0828   K 3.6 11/03/2017 0930   CL 105 04/13/2018 0828   CO2 26 04/13/2018 0828   CO2 27 11/03/2017 0930   BUN 12 04/13/2018 0828   BUN 11 12/08/2017 1551   BUN 13.4 11/03/2017 0930   CREATININE 0.69 04/13/2018 0828   CREATININE 0.9 11/03/2017 0930      Component Value Date/Time   CALCIUM 8.8 (L) 04/13/2018 0828   CALCIUM 9.4 11/03/2017 0930   ALKPHOS 62 04/13/2018 0828   ALKPHOS 84 11/03/2017 0930   AST 27 04/13/2018 0828   AST 20 11/03/2017 0930   ALT 20 04/13/2018 0828   ALT 18 11/03/2017 0930   BILITOT 0.2 (L) 04/13/2018 0828   BILITOT  0.64 11/03/2017 0930      All questions were answered. The patient knows to call the clinic with any problems, questions or concerns. No barriers to learning was detected.  I spent 15 minutes counseling the patient face to face. The total time spent in the appointment was 20 minutes and more than 50% was on counseling and review of test results  Heath Lark, MD 04/27/2018 9:40 AM

## 2018-04-27 NOTE — Assessment & Plan Note (Signed)
Clinically, she has worsening signs of dementia She has appointment to see neurologist I do not plan to escalate her treatment due to perceived worsening dementia

## 2018-04-27 NOTE — Patient Instructions (Signed)
[facility] Discharge Instructions for Patients Receiving Chemotherapy  Today you received the following chemotherapy agents velcade  To help prevent nausea and vomiting after your treatment, we encourage you to take your nausea medication as directed.   If you develop nausea and vomiting that is not controlled by your nausea medication, call the clinic.   BELOW ARE SYMPTOMS THAT SHOULD BE REPORTED IMMEDIATELY:  *FEVER GREATER THAN 100.5 F  *CHILLS WITH OR WITHOUT FEVER  NAUSEA AND VOMITING THAT IS NOT CONTROLLED WITH YOUR NAUSEA MEDICATION  *UNUSUAL SHORTNESS OF BREATH  *UNUSUAL BRUISING OR BLEEDING  TENDERNESS IN MOUTH AND THROAT WITH OR WITHOUT PRESENCE OF ULCERS  *URINARY PROBLEMS  *BOWEL PROBLEMS  UNUSUAL RASH Items with * indicate a potential emergency and should be followed up as soon as possible.  Feel free to call the clinic should you have any questions or concerns. The clinic phone number is [phone].  Please show the CHEMO ALERT CARD at check-in to the Emergency Department and triage nurse.   

## 2018-04-27 NOTE — Telephone Encounter (Signed)
Gave avs and calendar ° °

## 2018-04-28 ENCOUNTER — Encounter: Payer: Self-pay | Admitting: Neurology

## 2018-04-28 DIAGNOSIS — C9002 Multiple myeloma in relapse: Secondary | ICD-10-CM | POA: Diagnosis not present

## 2018-04-29 DIAGNOSIS — C9002 Multiple myeloma in relapse: Secondary | ICD-10-CM | POA: Diagnosis not present

## 2018-04-30 DIAGNOSIS — C9002 Multiple myeloma in relapse: Secondary | ICD-10-CM | POA: Diagnosis not present

## 2018-05-01 DIAGNOSIS — C9002 Multiple myeloma in relapse: Secondary | ICD-10-CM | POA: Diagnosis not present

## 2018-05-02 DIAGNOSIS — C9002 Multiple myeloma in relapse: Secondary | ICD-10-CM | POA: Diagnosis not present

## 2018-05-03 DIAGNOSIS — C9002 Multiple myeloma in relapse: Secondary | ICD-10-CM | POA: Diagnosis not present

## 2018-05-04 DIAGNOSIS — C9002 Multiple myeloma in relapse: Secondary | ICD-10-CM | POA: Diagnosis not present

## 2018-05-05 DIAGNOSIS — C9002 Multiple myeloma in relapse: Secondary | ICD-10-CM | POA: Diagnosis not present

## 2018-05-06 DIAGNOSIS — C9002 Multiple myeloma in relapse: Secondary | ICD-10-CM | POA: Diagnosis not present

## 2018-05-06 MED FILL — MORPHINE SULF ER 15 MG TAB: 15 | 30 days supply | Qty: 60 | Fill #0

## 2018-05-07 DIAGNOSIS — C9002 Multiple myeloma in relapse: Secondary | ICD-10-CM | POA: Diagnosis not present

## 2018-05-08 DIAGNOSIS — C9002 Multiple myeloma in relapse: Secondary | ICD-10-CM | POA: Diagnosis not present

## 2018-05-09 DIAGNOSIS — C9002 Multiple myeloma in relapse: Secondary | ICD-10-CM | POA: Diagnosis not present

## 2018-05-10 DIAGNOSIS — C9002 Multiple myeloma in relapse: Secondary | ICD-10-CM | POA: Diagnosis not present

## 2018-05-10 MED FILL — LISINOPRIL 40 MG TABLET: 40 | 30 days supply | Qty: 30 | Fill #4

## 2018-05-11 ENCOUNTER — Inpatient Hospital Stay: Payer: Medicare HMO

## 2018-05-11 VITALS — BP 146/89 | HR 61 | Temp 98.4°F | Resp 17

## 2018-05-11 DIAGNOSIS — C9002 Multiple myeloma in relapse: Secondary | ICD-10-CM

## 2018-05-11 DIAGNOSIS — Z5111 Encounter for antineoplastic chemotherapy: Secondary | ICD-10-CM | POA: Diagnosis not present

## 2018-05-11 DIAGNOSIS — F0390 Unspecified dementia without behavioral disturbance: Secondary | ICD-10-CM | POA: Diagnosis not present

## 2018-05-11 DIAGNOSIS — R531 Weakness: Secondary | ICD-10-CM | POA: Diagnosis not present

## 2018-05-11 DIAGNOSIS — D61818 Other pancytopenia: Secondary | ICD-10-CM | POA: Diagnosis not present

## 2018-05-11 DIAGNOSIS — C9001 Multiple myeloma in remission: Secondary | ICD-10-CM

## 2018-05-11 DIAGNOSIS — Z79899 Other long term (current) drug therapy: Secondary | ICD-10-CM | POA: Diagnosis not present

## 2018-05-11 DIAGNOSIS — G893 Neoplasm related pain (acute) (chronic): Secondary | ICD-10-CM | POA: Diagnosis not present

## 2018-05-11 LAB — CBC WITH DIFFERENTIAL/PLATELET
Basophils Absolute: 0.1 10*3/uL (ref 0.0–0.1)
Basophils Relative: 3 %
EOS PCT: 3 %
Eosinophils Absolute: 0.1 10*3/uL (ref 0.0–0.5)
HCT: 37.7 % (ref 34.8–46.6)
Hemoglobin: 12.4 g/dL (ref 11.6–15.9)
LYMPHS ABS: 1.2 10*3/uL (ref 0.9–3.3)
LYMPHS PCT: 39 %
MCH: 31.4 pg (ref 25.1–34.0)
MCHC: 33 g/dL (ref 31.5–36.0)
MCV: 95.2 fL (ref 79.5–101.0)
Monocytes Absolute: 0.3 10*3/uL (ref 0.1–0.9)
Monocytes Relative: 11 %
Neutro Abs: 1.3 10*3/uL — ABNORMAL LOW (ref 1.5–6.5)
Neutrophils Relative %: 44 %
PLATELETS: 184 10*3/uL (ref 145–400)
RBC: 3.96 MIL/uL (ref 3.70–5.45)
RDW: 17.9 % — ABNORMAL HIGH (ref 11.2–14.5)
WBC: 3 10*3/uL — AB (ref 3.9–10.3)

## 2018-05-11 MED ORDER — BORTEZOMIB CHEMO SQ INJECTION 3.5 MG (2.5MG/ML)
0.9750 mg/m2 | Freq: Once | INTRAMUSCULAR | Status: AC
  Administered 2018-05-11: 1.5 mg via SUBCUTANEOUS
  Filled 2018-05-11: qty 0.6

## 2018-05-11 MED ORDER — PROCHLORPERAZINE MALEATE 10 MG PO TABS
10.0000 mg | ORAL_TABLET | Freq: Once | ORAL | Status: AC
  Administered 2018-05-11: 10 mg via ORAL

## 2018-05-11 MED ORDER — PROCHLORPERAZINE MALEATE 10 MG PO TABS
ORAL_TABLET | ORAL | Status: AC
  Filled 2018-05-11: qty 1

## 2018-05-11 NOTE — Progress Notes (Signed)
Okay to threat without CMP results and ANC 1.3 per Dr. Alvy Bimler.

## 2018-05-11 NOTE — Patient Instructions (Signed)
[facility] Discharge Instructions for Patients Receiving Chemotherapy  Today you received the following chemotherapy agents Velcade.  To help prevent nausea and vomiting after your treatment, we encourage you to take your nausea medication as directed.  If you develop nausea and vomiting that is not controlled by your nausea medication, call the clinic.   BELOW ARE SYMPTOMS THAT SHOULD BE REPORTED IMMEDIATELY:  *FEVER GREATER THAN 100.5 F  *CHILLS WITH OR WITHOUT FEVER  NAUSEA AND VOMITING THAT IS NOT CONTROLLED WITH YOUR NAUSEA MEDICATION  *UNUSUAL SHORTNESS OF BREATH  *UNUSUAL BRUISING OR BLEEDING  TENDERNESS IN MOUTH AND THROAT WITH OR WITHOUT PRESENCE OF ULCERS  *URINARY PROBLEMS  *BOWEL PROBLEMS  UNUSUAL RASH Items with * indicate a potential emergency and should be followed up as soon as possible.  Feel free to call the clinic should you have any questions or concerns. The clinic phone number is [phone].  Please show the CHEMO ALERT CARD at check-in to the Emergency Department and triage nurse.   

## 2018-05-12 DIAGNOSIS — C9002 Multiple myeloma in relapse: Secondary | ICD-10-CM | POA: Diagnosis not present

## 2018-05-12 LAB — KAPPA/LAMBDA LIGHT CHAINS
Kappa free light chain: 281 mg/L — ABNORMAL HIGH (ref 3.3–19.4)
Kappa, lambda light chain ratio: 34.27 — ABNORMAL HIGH (ref 0.26–1.65)
Lambda free light chains: 8.2 mg/L (ref 5.7–26.3)

## 2018-05-13 DIAGNOSIS — C9002 Multiple myeloma in relapse: Secondary | ICD-10-CM | POA: Diagnosis not present

## 2018-05-13 LAB — MULTIPLE MYELOMA PANEL, SERUM
ALBUMIN/GLOB SERPL: 1.3 (ref 0.7–1.7)
Albumin SerPl Elph-Mcnc: 3.3 g/dL (ref 2.9–4.4)
Alpha 1: 0.2 g/dL (ref 0.0–0.4)
Alpha2 Glob SerPl Elph-Mcnc: 0.7 g/dL (ref 0.4–1.0)
B-GLOBULIN SERPL ELPH-MCNC: 1.1 g/dL (ref 0.7–1.3)
Gamma Glob SerPl Elph-Mcnc: 0.5 g/dL (ref 0.4–1.8)
Globulin, Total: 2.6 g/dL (ref 2.2–3.9)
IGM (IMMUNOGLOBULIN M), SRM: 10 mg/dL — AB (ref 26–217)
IgA: 283 mg/dL (ref 64–422)
IgG (Immunoglobin G), Serum: 632 mg/dL — ABNORMAL LOW (ref 700–1600)
M PROTEIN SERPL ELPH-MCNC: 0.2 g/dL — AB
TOTAL PROTEIN ELP: 5.9 g/dL — AB (ref 6.0–8.5)

## 2018-05-14 DIAGNOSIS — C9002 Multiple myeloma in relapse: Secondary | ICD-10-CM | POA: Diagnosis not present

## 2018-05-15 DIAGNOSIS — C9002 Multiple myeloma in relapse: Secondary | ICD-10-CM | POA: Diagnosis not present

## 2018-05-16 DIAGNOSIS — C9002 Multiple myeloma in relapse: Secondary | ICD-10-CM | POA: Diagnosis not present

## 2018-05-17 DIAGNOSIS — C9002 Multiple myeloma in relapse: Secondary | ICD-10-CM | POA: Diagnosis not present

## 2018-05-18 ENCOUNTER — Other Ambulatory Visit: Payer: Self-pay

## 2018-05-18 DIAGNOSIS — C9002 Multiple myeloma in relapse: Secondary | ICD-10-CM | POA: Diagnosis not present

## 2018-05-18 MED ORDER — LENALIDOMIDE 15 MG PO CAPS: ORAL_CAPSULE | ORAL | 11 refills | Status: DC

## 2018-05-19 ENCOUNTER — Other Ambulatory Visit: Payer: Self-pay | Admitting: Hematology and Oncology

## 2018-05-19 DIAGNOSIS — C9002 Multiple myeloma in relapse: Secondary | ICD-10-CM | POA: Diagnosis not present

## 2018-05-19 MED FILL — DEXAMETHASONE 1 MG TABLET: 1 | 90 days supply | Qty: 90 | Fill #0

## 2018-05-20 DIAGNOSIS — C9002 Multiple myeloma in relapse: Secondary | ICD-10-CM | POA: Diagnosis not present

## 2018-05-21 DIAGNOSIS — C9002 Multiple myeloma in relapse: Secondary | ICD-10-CM | POA: Diagnosis not present

## 2018-05-22 DIAGNOSIS — C9002 Multiple myeloma in relapse: Secondary | ICD-10-CM | POA: Diagnosis not present

## 2018-05-23 DIAGNOSIS — C9002 Multiple myeloma in relapse: Secondary | ICD-10-CM | POA: Diagnosis not present

## 2018-05-24 DIAGNOSIS — C9002 Multiple myeloma in relapse: Secondary | ICD-10-CM | POA: Diagnosis not present

## 2018-05-25 ENCOUNTER — Telehealth: Payer: Self-pay | Admitting: Hematology and Oncology

## 2018-05-25 ENCOUNTER — Inpatient Hospital Stay (HOSPITAL_BASED_OUTPATIENT_CLINIC_OR_DEPARTMENT_OTHER): Payer: Medicare HMO | Admitting: Hematology and Oncology

## 2018-05-25 ENCOUNTER — Inpatient Hospital Stay: Payer: Medicare HMO

## 2018-05-25 ENCOUNTER — Inpatient Hospital Stay: Payer: Medicare HMO | Attending: Hematology and Oncology

## 2018-05-25 ENCOUNTER — Encounter: Payer: Self-pay | Admitting: Hematology and Oncology

## 2018-05-25 DIAGNOSIS — C9002 Multiple myeloma in relapse: Secondary | ICD-10-CM | POA: Diagnosis not present

## 2018-05-25 DIAGNOSIS — F0390 Unspecified dementia without behavioral disturbance: Secondary | ICD-10-CM | POA: Insufficient documentation

## 2018-05-25 DIAGNOSIS — Z5111 Encounter for antineoplastic chemotherapy: Secondary | ICD-10-CM

## 2018-05-25 DIAGNOSIS — G893 Neoplasm related pain (acute) (chronic): Secondary | ICD-10-CM | POA: Diagnosis not present

## 2018-05-25 DIAGNOSIS — Z79899 Other long term (current) drug therapy: Secondary | ICD-10-CM

## 2018-05-25 DIAGNOSIS — C9001 Multiple myeloma in remission: Secondary | ICD-10-CM

## 2018-05-25 DIAGNOSIS — D61818 Other pancytopenia: Secondary | ICD-10-CM | POA: Diagnosis not present

## 2018-05-25 DIAGNOSIS — M48061 Spinal stenosis, lumbar region without neurogenic claudication: Secondary | ICD-10-CM

## 2018-05-25 LAB — CBC WITH DIFFERENTIAL/PLATELET
BASOS ABS: 0 10*3/uL (ref 0.0–0.1)
Basophils Relative: 0 %
Eosinophils Absolute: 0.2 10*3/uL (ref 0.0–0.5)
Eosinophils Relative: 5 %
HCT: 41.5 % (ref 34.8–46.6)
Hemoglobin: 13.6 g/dL (ref 11.6–15.9)
Lymphocytes Relative: 30 %
Lymphs Abs: 1.3 10*3/uL (ref 0.9–3.3)
MCH: 31.3 pg (ref 25.1–34.0)
MCHC: 32.8 g/dL (ref 31.5–36.0)
MCV: 95.6 fL (ref 79.5–101.0)
MONO ABS: 0.3 10*3/uL (ref 0.1–0.9)
Monocytes Relative: 7 %
NEUTROS ABS: 2.5 10*3/uL (ref 1.5–6.5)
Neutrophils Relative %: 58 %
PLATELETS: 120 10*3/uL — AB (ref 145–400)
RBC: 4.34 MIL/uL (ref 3.70–5.45)
RDW: 17.9 % — AB (ref 11.2–14.5)
WBC: 4.3 10*3/uL (ref 3.9–10.3)

## 2018-05-25 LAB — COMPREHENSIVE METABOLIC PANEL
ALT: 21 U/L (ref 0–44)
AST: 27 U/L (ref 15–41)
Albumin: 3.7 g/dL (ref 3.5–5.0)
Alkaline Phosphatase: 74 U/L (ref 38–126)
Anion gap: 11 (ref 5–15)
BUN: 12 mg/dL (ref 8–23)
CALCIUM: 9.1 mg/dL (ref 8.9–10.3)
CHLORIDE: 107 mmol/L (ref 98–111)
CO2: 28 mmol/L (ref 22–32)
CREATININE: 1.05 mg/dL — AB (ref 0.44–1.00)
GFR calc Af Amer: 57 mL/min — ABNORMAL LOW (ref >60)
GFR calc non Af Amer: 49 mL/min — ABNORMAL LOW (ref >60)
Glucose, Bld: 105 mg/dL — ABNORMAL HIGH (ref 70–99)
POTASSIUM: 3.7 mmol/L (ref 3.5–5.1)
Sodium: 146 mmol/L — ABNORMAL HIGH (ref 135–145)
TOTAL PROTEIN: 6.7 g/dL (ref 6.5–8.1)
Total Bilirubin: 1 mg/dL (ref 0.3–1.2)

## 2018-05-25 MED ORDER — OXYCODONE-ACETAMINOPHEN 5-325 MG PO TABS: 1.0000 | ORAL_TABLET | Freq: Four times a day (QID) | ORAL | 0 refills | Status: DC | PRN

## 2018-05-25 MED ORDER — PROCHLORPERAZINE MALEATE 10 MG PO TABS
10.0000 mg | ORAL_TABLET | Freq: Once | ORAL | Status: AC
  Administered 2018-05-25: 10 mg via ORAL

## 2018-05-25 MED ORDER — PROCHLORPERAZINE MALEATE 10 MG PO TABS
ORAL_TABLET | ORAL | Status: AC
  Filled 2018-05-25: qty 1

## 2018-05-25 MED ORDER — BORTEZOMIB CHEMO SQ INJECTION 3.5 MG (2.5MG/ML)
0.9750 mg/m2 | Freq: Once | INTRAMUSCULAR | Status: AC
  Administered 2018-05-25: 1.5 mg via SUBCUTANEOUS
  Filled 2018-05-25: qty 0.6

## 2018-05-25 NOTE — Assessment & Plan Note (Signed)
She has intermittent poorly controlled pain I recommend MS Contin twice a day along with oxycodone as needed for breakthrough pain

## 2018-05-25 NOTE — Telephone Encounter (Signed)
Gave avs and calendar ° °

## 2018-05-25 NOTE — Progress Notes (Signed)
[place] OFFICE PROGRESS NOTE  Patient Care Team: Susy Frizzle, MD as PCP - General (Family Medicine)  ASSESSMENT & PLAN:  Multiple myeloma in relapse [facility]) Recent myeloma panel showed near complete response to treatment but with mildly elevated light chains We have recently increased the dose of Revlimid. She is frail and weak.  I do not feel comfortable increasing the dose of Revlimid further Thankfully, she has no signs of end organ damage She will continue current prescription Revlimid along with Velcade every other week She will continue calcium with vitamin D supplement and Zometa every 3 months She will continue acyclovir for antimicrobial prophylaxis and aspirin for DVT prophylaxis   Acquired pancytopenia ([place]) She has intermittent thrombocytopenia and anemia, likely due to her disease We will monitor carefully  Cancer associated pain She has intermittent poorly controlled pain I recommend MS Contin twice a day along with oxycodone as needed for breakthrough pain  Dementia She has moderate dementia and was started on Aricept recently Her sister did not notice any improvement I recommend her sister could contact the patient's neurologist for further management   No orders of the defined types were placed in this encounter.   INTERVAL HISTORY: Please see below for problem oriented charting. She returns with her 2 sisters for further follow-up She complained of intermittent pain Her sisters felt that her pain is not well controlled She has lost some weight due to poor appetite There were no recent change in mental status such as confusion There were no recent reported bleeding or infection  SUMMARY OF ONCOLOGIC HISTORY:   Multiple myeloma in relapse ([place])   01/09/2016 Imaging    MRI back showed Interval development of diffuse bone marrow infiltration by innumerable small lesions most consistent with multiple myeloma      01/23/2016 Bone Marrow  Biopsy    Accession: [id number] Bone marrow biopsy showed 51% plasma cells      01/23/2016 Imaging    Skeletal survey showed lytic lesions      01/23/2016 Pathology Results    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 Revlimid was stopped in July and resumed in October      04/01/2016 Adverse Reaction    Treatment was placed on hold temporarily due to neutropenia       REVIEW OF SYSTEMS:   Constitutional: Denies fevers, chills  Eyes: Denies blurriness of vision Ears, nose, mouth, throat, and face: Denies mucositis or sore throat Respiratory: Denies cough, dyspnea or wheezes Cardiovascular: Denies palpitation, chest discomfort or lower extremity swelling Gastrointestinal:  Denies nausea, heartburn or change in bowel habits Skin: Denies abnormal skin rashes Lymphatics: Denies new lymphadenopathy or easy bruising Neurological:Denies numbness, tingling or new weaknesses Behavioral/Psych: Mood is stable, no new changes  All other systems were reviewed with the patient and are negative.  I have reviewed the past medical history, past surgical history, social history and family history with the patient and they are unchanged from previous note.  ALLERGIES:  is allergic to pravastatin and zocor [simvastatin].  MEDICATIONS:  Current Outpatient Medications  Medication Sig Dispense Refill  . acyclovir (ZOVIRAX) 400 MG tablet Take 1 tablet (400 mg total) by mouth daily. 90 tablet 11  . aspirin EC 81 MG tablet Take 81 mg by mouth daily.    . bortezomib IV (VELCADE) 3.5 MG injection Inject 1.3 mg/m2 into the vein  once. Once every other week    . Cholecalciferol (VITAMIN D PO) Take by mouth.    . dexamethasone (DECADRON) 1 MG tablet TAKE 1 TABLET BY MOUTH ONCE DAILY 90 tablet 1  . donepezil (ARICEPT) 10 MG tablet Take 1/2 tablet daily for 2 weeks, then increase to 1 tablet daily 30 tablet 11  .  lenalidomide (REVLIMID) 15 MG capsule Take 1 every day by mouth for 21 days, then off 7 days 21 capsule 11  . loperamide (IMODIUM) 2 MG capsule Take 1 capsule (2 mg total) by mouth 4 (four) times daily as needed for diarrhea or loose stools. 12 capsule 0  . morphine (MS CONTIN) 15 MG 12 hr tablet Take 1 tablet (15 mg total) by mouth every 12 (twelve) hours. 60 tablet 0  . oxyCODONE-acetaminophen (ROXICET) 5-325 MG tablet Take 1 tablet by mouth every 6 (six) hours as needed for severe pain. 90 tablet 0   No current facility-administered medications for this visit.     PHYSICAL EXAMINATION: ECOG PERFORMANCE STATUS: 2 - Symptomatic, <50% confined to bed  Vitals:   05/25/18 0915  BP: 109/61  Pulse: 96  Resp: 18  Temp: 98.7 F (37.1 C)  SpO2: 97%   Filed Weights   05/25/18 0915  Weight: 144 lb 4.8 oz (65.5 kg)    GENERAL:alert, no distress and comfortable SKIN: skin color, texture, turgor are normal, no rashes or significant lesions EYES: normal, Conjunctiva are pink and non-injected, sclera clear OROPHARYNX:no exudate, no erythema and lips, buccal mucosa, and tongue normal  NECK: supple, thyroid normal size, non-tender, without nodularity LYMPH:  no palpable lymphadenopathy in the cervical, axillary or inguinal LUNGS: clear to auscultation and percussion with normal breathing effort HEART: regular rate & rhythm and no murmurs and no lower extremity edema ABDOMEN:abdomen soft, non-tender and normal bowel sounds Musculoskeletal:no cyanosis of digits and no clubbing  NEURO: alert with fluent speech, no focal motor/sensory deficits  LABORATORY DATA:  I have reviewed the data as listed    Component Value Date/Time   NA 146 (H) 05/25/2018 0836   NA 145 11/03/2017 0930   K 3.7 05/25/2018 0836   K 3.6 11/03/2017 0930   CL 107 05/25/2018 0836   CO2 28 05/25/2018 0836   CO2 27 11/03/2017 0930   GLUCOSE 105 (H) 05/25/2018 0836   GLUCOSE 112 11/03/2017 0930   BUN 12 05/25/2018 0836    BUN 11 12/08/2017 1551   BUN 13.4 11/03/2017 0930   CREATININE 1.05 (H) 05/25/2018 0836   CREATININE 0.9 11/03/2017 0930   CALCIUM 9.1 05/25/2018 0836   CALCIUM 9.4 11/03/2017 0930   PROT 6.7 05/25/2018 0836   PROT 6.2 11/03/2017 0930   PROT 6.9 11/03/2017 0930   ALBUMIN 3.7 05/25/2018 0836   ALBUMIN 3.2 (L) 11/03/2017 0930   AST 27 05/25/2018 0836   AST 20 11/03/2017 0930   ALT 21 05/25/2018 0836   ALT 18 11/03/2017 0930   ALKPHOS 74 05/25/2018 0836   ALKPHOS 84 11/03/2017 0930   BILITOT 1.0 05/25/2018 0836   BILITOT 0.64 11/03/2017 0930   GFRNONAA 49 (L) 05/25/2018 0836   GFRNONAA 64 01/17/2016 0910   GFRAA 57 (L) 05/25/2018 0836   GFRAA 74 01/17/2016 0910    No results found for: SPEP, UPEP  Lab Results  Component Value Date   WBC 4.3 05/25/2018   NEUTROABS 2.5 05/25/2018   HGB 13.6 05/25/2018   HCT 41.5 05/25/2018   MCV 95.6 05/25/2018   PLT 120 (L)  05/25/2018      Chemistry      Component Value Date/Time   NA 146 (H) 05/25/2018 0836   NA 145 11/03/2017 0930   K 3.7 05/25/2018 0836   K 3.6 11/03/2017 0930   CL 107 05/25/2018 0836   CO2 28 05/25/2018 0836   CO2 27 11/03/2017 0930   BUN 12 05/25/2018 0836   BUN 11 12/08/2017 1551   BUN 13.4 11/03/2017 0930   CREATININE 1.05 (H) 05/25/2018 0836   CREATININE 0.9 11/03/2017 0930      Component Value Date/Time   CALCIUM 9.1 05/25/2018 0836   CALCIUM 9.4 11/03/2017 0930   ALKPHOS 74 05/25/2018 0836   ALKPHOS 84 11/03/2017 0930   AST 27 05/25/2018 0836   AST 20 11/03/2017 0930   ALT 21 05/25/2018 0836   ALT 18 11/03/2017 0930   BILITOT 1.0 05/25/2018 0836   BILITOT 0.64 11/03/2017 0930       All questions were answered. The patient knows to call the clinic with any problems, questions or concerns. No barriers to learning was detected.  I spent 15 minutes counseling the patient face to face. The total time spent in the appointment was 20 minutes and more than 50% was on counseling and review of test  results  Heath Lark, MD 05/25/2018 2:44 PM

## 2018-05-25 NOTE — Assessment & Plan Note (Signed)
Recent myeloma panel showed near complete response to treatment but with mildly elevated light chains We have recently increased the dose of Revlimid. She is frail and weak.  I do not feel comfortable increasing the dose of Revlimid further Thankfully, she has no signs of end organ damage She will continue current prescription Revlimid along with Velcade every other week She will continue calcium with vitamin D supplement and Zometa every 3 months She will continue acyclovir for antimicrobial prophylaxis and aspirin for DVT prophylaxis

## 2018-05-25 NOTE — Assessment & Plan Note (Signed)
She has moderate dementia and was started on Aricept recently Her sister did not notice any improvement I recommend her sister could contact the patient's neurologist for further management

## 2018-05-25 NOTE — Telephone Encounter (Signed)
Per MD can do lab after her f/u

## 2018-05-25 NOTE — Patient Instructions (Signed)
[facility] Discharge Instructions for Patients Receiving Chemotherapy  Today you received the following chemotherapy agents Velcade.  To help prevent nausea and vomiting after your treatment, we encourage you to take your nausea medication as directed.  If you develop nausea and vomiting that is not controlled by your nausea medication, call the clinic.   BELOW ARE SYMPTOMS THAT SHOULD BE REPORTED IMMEDIATELY:  *FEVER GREATER THAN 100.5 F  *CHILLS WITH OR WITHOUT FEVER  NAUSEA AND VOMITING THAT IS NOT CONTROLLED WITH YOUR NAUSEA MEDICATION  *UNUSUAL SHORTNESS OF BREATH  *UNUSUAL BRUISING OR BLEEDING  TENDERNESS IN MOUTH AND THROAT WITH OR WITHOUT PRESENCE OF ULCERS  *URINARY PROBLEMS  *BOWEL PROBLEMS  UNUSUAL RASH Items with * indicate a potential emergency and should be followed up as soon as possible.  Feel free to call the clinic should you have any questions or concerns. The clinic phone number is [phone].  Please show the CHEMO ALERT CARD at check-in to the Emergency Department and triage nurse.   

## 2018-05-25 NOTE — Assessment & Plan Note (Signed)
She has intermittent thrombocytopenia and anemia, likely due to her disease We will monitor carefully

## 2018-05-26 DIAGNOSIS — C9002 Multiple myeloma in relapse: Secondary | ICD-10-CM | POA: Diagnosis not present

## 2018-05-27 DIAGNOSIS — C9002 Multiple myeloma in relapse: Secondary | ICD-10-CM | POA: Diagnosis not present

## 2018-05-28 DIAGNOSIS — C9002 Multiple myeloma in relapse: Secondary | ICD-10-CM | POA: Diagnosis not present

## 2018-05-29 DIAGNOSIS — C9002 Multiple myeloma in relapse: Secondary | ICD-10-CM | POA: Diagnosis not present

## 2018-05-30 DIAGNOSIS — C9002 Multiple myeloma in relapse: Secondary | ICD-10-CM | POA: Diagnosis not present

## 2018-05-31 DIAGNOSIS — C9002 Multiple myeloma in relapse: Secondary | ICD-10-CM | POA: Diagnosis not present

## 2018-06-01 DIAGNOSIS — C9002 Multiple myeloma in relapse: Secondary | ICD-10-CM | POA: Diagnosis not present

## 2018-06-01 MED FILL — OXYCODONE-ACETAMINOPHEN 5-3: 5-325 | 23 days supply | Qty: 90 | Fill #0

## 2018-06-01 MED FILL — DONEPEZIL HCL 10 MG TABLET: 10 | 30 days supply | Qty: 30 | Fill #1

## 2018-06-02 DIAGNOSIS — C9002 Multiple myeloma in relapse: Secondary | ICD-10-CM | POA: Diagnosis not present

## 2018-06-03 DIAGNOSIS — C9002 Multiple myeloma in relapse: Secondary | ICD-10-CM | POA: Diagnosis not present

## 2018-06-04 DIAGNOSIS — C9002 Multiple myeloma in relapse: Secondary | ICD-10-CM | POA: Diagnosis not present

## 2018-06-05 DIAGNOSIS — C9002 Multiple myeloma in relapse: Secondary | ICD-10-CM | POA: Diagnosis not present

## 2018-06-06 DIAGNOSIS — C9002 Multiple myeloma in relapse: Secondary | ICD-10-CM | POA: Diagnosis not present

## 2018-06-07 DIAGNOSIS — C9002 Multiple myeloma in relapse: Secondary | ICD-10-CM | POA: Diagnosis not present

## 2018-06-08 ENCOUNTER — Inpatient Hospital Stay: Payer: Medicare HMO

## 2018-06-08 VITALS — BP 161/79 | HR 69 | Temp 97.9°F | Resp 18

## 2018-06-08 DIAGNOSIS — Z5111 Encounter for antineoplastic chemotherapy: Secondary | ICD-10-CM | POA: Diagnosis not present

## 2018-06-08 DIAGNOSIS — Z79899 Other long term (current) drug therapy: Secondary | ICD-10-CM | POA: Diagnosis not present

## 2018-06-08 DIAGNOSIS — F0390 Unspecified dementia without behavioral disturbance: Secondary | ICD-10-CM | POA: Diagnosis not present

## 2018-06-08 DIAGNOSIS — G893 Neoplasm related pain (acute) (chronic): Secondary | ICD-10-CM | POA: Diagnosis not present

## 2018-06-08 DIAGNOSIS — C9002 Multiple myeloma in relapse: Secondary | ICD-10-CM

## 2018-06-08 DIAGNOSIS — D61818 Other pancytopenia: Secondary | ICD-10-CM | POA: Diagnosis not present

## 2018-06-08 DIAGNOSIS — C9001 Multiple myeloma in remission: Secondary | ICD-10-CM

## 2018-06-08 LAB — CBC WITH DIFFERENTIAL/PLATELET
BASOS ABS: 0.1 10*3/uL (ref 0.0–0.1)
Basophils Relative: 2 %
EOS PCT: 4 %
Eosinophils Absolute: 0.2 10*3/uL (ref 0.0–0.5)
HEMATOCRIT: 38.6 % (ref 34.8–46.6)
HEMOGLOBIN: 12.8 g/dL (ref 11.6–15.9)
LYMPHS PCT: 31 %
Lymphs Abs: 1.2 10*3/uL (ref 0.9–3.3)
MCH: 31.3 pg (ref 25.1–34.0)
MCHC: 33.1 g/dL (ref 31.5–36.0)
MCV: 94.4 fL (ref 79.5–101.0)
Monocytes Absolute: 0.4 10*3/uL (ref 0.1–0.9)
Monocytes Relative: 9 %
NEUTROS ABS: 2.1 10*3/uL (ref 1.5–6.5)
NEUTROS PCT: 54 %
PLATELETS: 145 10*3/uL (ref 145–400)
RBC: 4.09 MIL/uL (ref 3.70–5.45)
RDW: 18 % — ABNORMAL HIGH (ref 11.2–14.5)
WBC: 4 10*3/uL (ref 3.9–10.3)

## 2018-06-08 LAB — COMPREHENSIVE METABOLIC PANEL
ALT: 16 U/L (ref 0–44)
AST: 17 U/L (ref 15–41)
Albumin: 3.6 g/dL (ref 3.5–5.0)
Alkaline Phosphatase: 71 U/L (ref 38–126)
Anion gap: 10 (ref 5–15)
BUN: 10 mg/dL (ref 8–23)
CHLORIDE: 106 mmol/L (ref 98–111)
CO2: 29 mmol/L (ref 22–32)
CREATININE: 0.78 mg/dL (ref 0.44–1.00)
Calcium: 9.3 mg/dL (ref 8.9–10.3)
GFR calc Af Amer: >60 mL/min (ref >60)
GFR calc non Af Amer: >60 mL/min (ref >60)
Glucose, Bld: 95 mg/dL (ref 70–99)
Potassium: 3.7 mmol/L (ref 3.5–5.1)
Sodium: 145 mmol/L (ref 135–145)
Total Bilirubin: 0.6 mg/dL (ref 0.3–1.2)
Total Protein: 6.4 g/dL — ABNORMAL LOW (ref 6.5–8.1)

## 2018-06-08 MED ORDER — PROCHLORPERAZINE MALEATE 10 MG PO TABS
ORAL_TABLET | ORAL | Status: AC
  Filled 2018-06-08: qty 1

## 2018-06-08 MED ORDER — BORTEZOMIB CHEMO SQ INJECTION 3.5 MG (2.5MG/ML)
0.9750 mg/m2 | Freq: Once | INTRAMUSCULAR | Status: AC
  Administered 2018-06-08: 1.5 mg via SUBCUTANEOUS
  Filled 2018-06-08: qty 0.6

## 2018-06-08 MED ORDER — PROCHLORPERAZINE MALEATE 10 MG PO TABS
10.0000 mg | ORAL_TABLET | Freq: Once | ORAL | Status: AC
  Administered 2018-06-08: 10 mg via ORAL

## 2018-06-08 NOTE — Patient Instructions (Signed)
[facility] Discharge Instructions for Patients Receiving Chemotherapy  Today you received the following chemotherapy agents Velcade.  To help prevent nausea and vomiting after your treatment, we encourage you to take your nausea medication as directed.  If you develop nausea and vomiting that is not controlled by your nausea medication, call the clinic.   BELOW ARE SYMPTOMS THAT SHOULD BE REPORTED IMMEDIATELY:  *FEVER GREATER THAN 100.5 F  *CHILLS WITH OR WITHOUT FEVER  NAUSEA AND VOMITING THAT IS NOT CONTROLLED WITH YOUR NAUSEA MEDICATION  *UNUSUAL SHORTNESS OF BREATH  *UNUSUAL BRUISING OR BLEEDING  TENDERNESS IN MOUTH AND THROAT WITH OR WITHOUT PRESENCE OF ULCERS  *URINARY PROBLEMS  *BOWEL PROBLEMS  UNUSUAL RASH Items with * indicate a potential emergency and should be followed up as soon as possible.  Feel free to call the clinic should you have any questions or concerns. The clinic phone number is [phone].  Please show the CHEMO ALERT CARD at check-in to the Emergency Department and triage nurse.   

## 2018-06-09 DIAGNOSIS — C9002 Multiple myeloma in relapse: Secondary | ICD-10-CM | POA: Diagnosis not present

## 2018-06-09 LAB — MULTIPLE MYELOMA PANEL, SERUM
ALBUMIN SERPL ELPH-MCNC: 3.3 g/dL (ref 2.9–4.4)
ALPHA 1: 0.3 g/dL (ref 0.0–0.4)
ALPHA2 GLOB SERPL ELPH-MCNC: 0.9 g/dL (ref 0.4–1.0)
Albumin/Glob SerPl: 1.2 (ref 0.7–1.7)
B-Globulin SerPl Elph-Mcnc: 1.2 g/dL (ref 0.7–1.3)
Gamma Glob SerPl Elph-Mcnc: 0.5 g/dL (ref 0.4–1.8)
Globulin, Total: 2.8 g/dL (ref 2.2–3.9)
IGA: 256 mg/dL (ref 64–422)
IGG (IMMUNOGLOBIN G), SERUM: 561 mg/dL — AB (ref 700–1600)
IGM (IMMUNOGLOBULIN M), SRM: 10 mg/dL — AB (ref 26–217)
M Protein SerPl Elph-Mcnc: 0.2 g/dL — ABNORMAL HIGH
Total Protein ELP: 6.1 g/dL (ref 6.0–8.5)

## 2018-06-09 LAB — KAPPA/LAMBDA LIGHT CHAINS
KAPPA FREE LGHT CHN: 207.7 mg/L — AB (ref 3.3–19.4)
Kappa, lambda light chain ratio: 26.63 — ABNORMAL HIGH (ref 0.26–1.65)
LAMDA FREE LIGHT CHAINS: 7.8 mg/L (ref 5.7–26.3)

## 2018-06-10 DIAGNOSIS — C9002 Multiple myeloma in relapse: Secondary | ICD-10-CM | POA: Diagnosis not present

## 2018-06-11 DIAGNOSIS — C9002 Multiple myeloma in relapse: Secondary | ICD-10-CM | POA: Diagnosis not present

## 2018-06-12 DIAGNOSIS — C9002 Multiple myeloma in relapse: Secondary | ICD-10-CM | POA: Diagnosis not present

## 2018-06-13 DIAGNOSIS — C9002 Multiple myeloma in relapse: Secondary | ICD-10-CM | POA: Diagnosis not present

## 2018-06-14 DIAGNOSIS — C9002 Multiple myeloma in relapse: Secondary | ICD-10-CM | POA: Diagnosis not present

## 2018-06-15 DIAGNOSIS — C9002 Multiple myeloma in relapse: Secondary | ICD-10-CM | POA: Diagnosis not present

## 2018-06-16 DIAGNOSIS — C9002 Multiple myeloma in relapse: Secondary | ICD-10-CM | POA: Diagnosis not present

## 2018-06-17 ENCOUNTER — Other Ambulatory Visit: Payer: Self-pay

## 2018-06-17 DIAGNOSIS — C9002 Multiple myeloma in relapse: Secondary | ICD-10-CM | POA: Diagnosis not present

## 2018-06-17 MED ORDER — LENALIDOMIDE 15 MG PO CAPS: ORAL_CAPSULE | ORAL | 11 refills | Status: DC

## 2018-06-18 DIAGNOSIS — C9002 Multiple myeloma in relapse: Secondary | ICD-10-CM | POA: Diagnosis not present

## 2018-06-19 DIAGNOSIS — C9002 Multiple myeloma in relapse: Secondary | ICD-10-CM | POA: Diagnosis not present

## 2018-06-20 DIAGNOSIS — C9002 Multiple myeloma in relapse: Secondary | ICD-10-CM | POA: Diagnosis not present

## 2018-06-21 DIAGNOSIS — C9002 Multiple myeloma in relapse: Secondary | ICD-10-CM | POA: Diagnosis not present

## 2018-06-22 ENCOUNTER — Encounter: Payer: Self-pay | Admitting: Hematology and Oncology

## 2018-06-22 ENCOUNTER — Inpatient Hospital Stay: Payer: Medicare HMO

## 2018-06-22 ENCOUNTER — Telehealth: Payer: Self-pay | Admitting: Hematology and Oncology

## 2018-06-22 ENCOUNTER — Inpatient Hospital Stay (HOSPITAL_BASED_OUTPATIENT_CLINIC_OR_DEPARTMENT_OTHER): Payer: Medicare HMO | Admitting: Hematology and Oncology

## 2018-06-22 ENCOUNTER — Inpatient Hospital Stay: Payer: Medicare HMO | Attending: Hematology and Oncology

## 2018-06-22 DIAGNOSIS — C9002 Multiple myeloma in relapse: Secondary | ICD-10-CM

## 2018-06-22 DIAGNOSIS — I10 Essential (primary) hypertension: Secondary | ICD-10-CM

## 2018-06-22 DIAGNOSIS — E893 Postprocedural hypopituitarism: Secondary | ICD-10-CM | POA: Insufficient documentation

## 2018-06-22 DIAGNOSIS — Z5111 Encounter for antineoplastic chemotherapy: Secondary | ICD-10-CM | POA: Diagnosis not present

## 2018-06-22 DIAGNOSIS — Z79899 Other long term (current) drug therapy: Secondary | ICD-10-CM

## 2018-06-22 DIAGNOSIS — F0390 Unspecified dementia without behavioral disturbance: Secondary | ICD-10-CM

## 2018-06-22 DIAGNOSIS — M48061 Spinal stenosis, lumbar region without neurogenic claudication: Secondary | ICD-10-CM

## 2018-06-22 DIAGNOSIS — Z7982 Long term (current) use of aspirin: Secondary | ICD-10-CM

## 2018-06-22 DIAGNOSIS — R531 Weakness: Secondary | ICD-10-CM | POA: Diagnosis not present

## 2018-06-22 DIAGNOSIS — Z9221 Personal history of antineoplastic chemotherapy: Secondary | ICD-10-CM | POA: Insufficient documentation

## 2018-06-22 DIAGNOSIS — G893 Neoplasm related pain (acute) (chronic): Secondary | ICD-10-CM

## 2018-06-22 DIAGNOSIS — C9001 Multiple myeloma in remission: Secondary | ICD-10-CM

## 2018-06-22 LAB — CBC WITH DIFFERENTIAL/PLATELET
BASOS ABS: 0 10*3/uL (ref 0.0–0.1)
BASOS PCT: 1 %
EOS ABS: 0.2 10*3/uL (ref 0.0–0.5)
Eosinophils Relative: 8 %
HEMATOCRIT: 39.3 % (ref 34.8–46.6)
HEMOGLOBIN: 12.6 g/dL (ref 11.6–15.9)
Lymphocytes Relative: 45 %
Lymphs Abs: 1.4 10*3/uL (ref 0.9–3.3)
MCH: 31.3 pg (ref 25.1–34.0)
MCHC: 32.1 g/dL (ref 31.5–36.0)
MCV: 97.8 fL (ref 79.5–101.0)
MONOS PCT: 8 %
Monocytes Absolute: 0.2 10*3/uL (ref 0.1–0.9)
NEUTROS ABS: 1.1 10*3/uL — AB (ref 1.5–6.5)
NEUTROS PCT: 38 %
Platelets: 83 10*3/uL — ABNORMAL LOW (ref 145–400)
RBC: 4.02 MIL/uL (ref 3.70–5.45)
RDW: 16.7 % — ABNORMAL HIGH (ref 11.2–14.5)
WBC: 2.9 10*3/uL — AB (ref 3.9–10.3)

## 2018-06-22 LAB — COMPREHENSIVE METABOLIC PANEL
ALBUMIN: 3.6 g/dL (ref 3.5–5.0)
ALK PHOS: 60 U/L (ref 38–126)
ALT: 18 U/L (ref 0–44)
AST: 19 U/L (ref 15–41)
Anion gap: 14 (ref 5–15)
BILIRUBIN TOTAL: 0.7 mg/dL (ref 0.3–1.2)
BUN: 10 mg/dL (ref 8–23)
CALCIUM: 8.7 mg/dL — AB (ref 8.9–10.3)
CO2: 23 mmol/L (ref 22–32)
CREATININE: 0.75 mg/dL (ref 0.44–1.00)
Chloride: 108 mmol/L (ref 98–111)
GFR calc Af Amer: >60 mL/min (ref >60)
GLUCOSE: 87 mg/dL (ref 70–99)
Potassium: 3.5 mmol/L (ref 3.5–5.1)
Sodium: 145 mmol/L (ref 135–145)
TOTAL PROTEIN: 6.5 g/dL (ref 6.5–8.1)

## 2018-06-22 MED ORDER — MORPHINE SULFATE ER 15 MG PO TBCR: 15.0000 mg | EXTENDED_RELEASE_TABLET | Freq: Two times a day (BID) | ORAL | 0 refills | Status: DC

## 2018-06-22 MED ORDER — PROCHLORPERAZINE MALEATE 10 MG PO TABS
ORAL_TABLET | ORAL | Status: AC
  Filled 2018-06-22: qty 1

## 2018-06-22 MED ORDER — OXYCODONE-ACETAMINOPHEN 5-325 MG PO TABS: 1.0000 | ORAL_TABLET | Freq: Four times a day (QID) | ORAL | 0 refills | Status: DC | PRN

## 2018-06-22 MED ORDER — BORTEZOMIB CHEMO SQ INJECTION 3.5 MG (2.5MG/ML): 0.9750 mg/m2 | Freq: Once | INTRAMUSCULAR | Status: DC

## 2018-06-22 MED ORDER — PROCHLORPERAZINE MALEATE 10 MG PO TABS
10.0000 mg | ORAL_TABLET | Freq: Once | ORAL | Status: AC
  Administered 2018-06-22: 10 mg via ORAL

## 2018-06-22 MED FILL — MORPHINE SULF ER 15 MG TAB: 15 | 30 days supply | Qty: 60 | Fill #0

## 2018-06-22 NOTE — Progress Notes (Signed)
[facility] OFF[facility] PROGRESS NOTE  Patient Care Team: Susy Frizzle, MD as PCP - General (Family Medicine)  ASSESSMENT & PLAN:  Multiple myeloma in relapse [facility]) Recent myeloma panel showed near complete response to treatment but with mildly elevated light chains We have recently increased the dose of Revlimid. She is frail and weak.  I do not feel comfortable increasing the dose of Revlimid further Thankfully, she has no signs of end organ damage She will continue current prescription Revlimid along with Velcade every other week She will continue calcium with vitamin D supplement and Zometa every 3 months She will continue acyclovir for antimicrobial prophylaxis and aspirin for DVT prophylaxis   Cancer associated pain She has intermittent poorly controlled pain I recommend MS Contin twice a day along with oxycodone as needed for breakthrough pain I refilled her prescriptions today  Dementia She has moderate dementia and was started on Aricept recently Her sister did not notice any improvement I recommend her sister could contact the patient's neurologist for further management  Essential hypertension Her blood pressure is stable with reduced dose prescription of lisinopril.  She will continue the same   No orders of the defined types were placed in this encounter.   INTERVAL HISTORY: Please see below for problem oriented charting. She returns with her sisters for further follow-up She denies peripheral neuropathy No recent fall or infection She continues to have poorly controlled pain, however, stable with current prescription pain medicine Her sister resume low-dose lisinopril for blood pressure control.  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 Revlimid was stopped in July and resumed in October      04/01/2016 Adverse Reaction    Treatment was placed on hold temporarily due to neutropenia       REVIEW OF SYSTEMS:   Constitutional: Denies fevers, chills  Eyes: Denies blurriness of vision Ears, nose, mouth, throat, and face: Denies mucositis or sore throat Respiratory: Denies cough, dyspnea or wheezes Cardiovascular: Denies palpitation, chest discomfort or lower extremity swelling Gastrointestinal:  Denies nausea, heartburn or change in bowel habits Skin: Denies abnormal skin rashes Lymphatics: Denies new lymphadenopathy or easy bruising Neurological:Denies numbness, tingling or new weaknesses Behavioral/Psych: Mood is stable, no new changes  All other systems were reviewed with the patient and are negative.  I have reviewed the past medical history, past surgical history, social history and family history with the patient and they are unchanged from previous note.  ALLERGIES:  is allergic to pravastatin and zocor [simvastatin].  MEDICATIONS:  Current Outpatient Medications  Medication Sig Dispense Refill  . acyclovir (ZOVIRAX) 400 MG tablet Take 1 tablet (400 mg total) by mouth daily. 90 tablet 11  . aspirin EC 81 MG tablet Take 81 mg by mouth daily.    . bortezomib IV (VELCADE) 3.5 MG injection Inject 1.3 mg/m2 into the vein  once. Once every other week    . Cholecalciferol (VITAMIN D PO) Take by mouth.    . dexamethasone (DECADRON) 1 MG tablet TAKE 1 TABLET BY MOUTH ONCE DAILY 90 tablet 1  . donepezil (ARICEPT) 10 MG tablet Take 1/2 tablet daily for 2 weeks, then  increase to 1 tablet daily 30 tablet 11  . lenalidomide (REVLIMID) 15 MG capsule Take 1 every day by mouth for 21 days, then off 7 days 21 capsule 11  . loperamide (IMODIUM) 2 MG capsule Take 1 capsule (2 mg total) by mouth 4 (four) times daily as needed for diarrhea or loose stools. 12 capsule 0  . morphine (MS CONTIN) 15 MG 12 hr tablet Take 1 tablet (15 mg total) by mouth every 12 (twelve) hours. 60 tablet 0  . oxyCODONE-acetaminophen (ROXICET) 5-325 MG tablet Take 1 tablet by mouth every 6 (six) hours as needed for severe pain. 90 tablet 0   No current facility-administered medications for this visit.     PHYSICAL EXAMINATION: ECOG PERFORMANCE STATUS: 2 - Symptomatic, <50% confined to bed  Vitals:   06/22/18 0816  BP: (!) 146/48  Pulse: 64  Resp: 18  Temp: 97.7 F (36.5 C)  SpO2: 100%   Filed Weights   06/22/18 0816  Weight: 141 lb 6.4 oz (64.1 kg)    GENERAL:alert, no distress and comfortable SKIN: skin color, texture, turgor are normal, no rashes or significant lesions EYES: normal, Conjunctiva are pink and non-injected, sclera clear OROPHARYNX:no exudate, no erythema and lips, buccal mucosa, and tongue normal  NECK: supple, thyroid normal size, non-tender, without nodularity LYMPH:  no palpable lymphadenopathy in the cervical, axillary or inguinal LUNGS: clear to auscultation and percussion with normal breathing effort HEART: regular rate & rhythm and no murmurs and no lower extremity edema ABDOMEN:abdomen soft, non-tender and normal bowel sounds Musculoskeletal:no cyanosis of digits and no clubbing  NEURO: alert & oriented x 3 with fluent speech, no focal motor/sensory deficits  LABORATORY DATA:  I have reviewed the data as listed    Component Value Date/Time   NA 145 06/08/2018 1022   NA 145 11/03/2017 0930   K 3.7 06/08/2018 1022   K 3.6 11/03/2017 0930   CL 106 06/08/2018 1022   CO2 29 06/08/2018 1022   CO2 27 11/03/2017 0930   GLUCOSE 95 06/08/2018 1022    GLUCOSE 112 11/03/2017 0930   BUN 10 06/08/2018 1022   BUN 11 12/08/2017 1551   BUN 13.4 11/03/2017 0930   CREATININE 0.78 06/08/2018 1022   CREATININE 0.9 11/03/2017 0930   CALCIUM 9.3 06/08/2018 1022   CALCIUM 9.4 11/03/2017 0930   PROT 6.4 (L) 06/08/2018 1022   PROT 6.2 11/03/2017 0930   PROT 6.9 11/03/2017 0930   ALBUMIN 3.6 06/08/2018 1022   ALBUMIN 3.2 (L) 11/03/2017 0930   AST 17 06/08/2018 1022   AST 20 11/03/2017 0930   ALT 16 06/08/2018 1022   ALT 18 11/03/2017 0930   ALKPHOS 71 06/08/2018 1022   ALKPHOS 84 11/03/2017 0930   BILITOT 0.6 06/08/2018 1022   BILITOT 0.64 11/03/2017 0930   GFRNONAA >60 06/08/2018 1022   GFRNONAA 64 01/17/2016 0910   GFRAA >60 06/08/2018 1022   GFRAA 74 01/17/2016 0910    No results found for: SPEP, UPEP  Lab Results  Component Value Date   WBC 4.0 06/08/2018   NEUTROABS 2.1 06/08/2018   HGB 12.8 06/08/2018   HCT 38.6 06/08/2018   MCV 94.4 06/08/2018   PLT 145  06/08/2018      Chemistry      Component Value Date/Time   NA 145 06/08/2018 1022   NA 145 11/03/2017 0930   K 3.7 06/08/2018 1022   K 3.6 11/03/2017 0930   CL 106 06/08/2018 1022   CO2 29 06/08/2018 1022   CO2 27 11/03/2017 0930   BUN 10 06/08/2018 1022   BUN 11 12/08/2017 1551   BUN 13.4 11/03/2017 0930   CREATININE 0.78 06/08/2018 1022   CREATININE 0.9 11/03/2017 0930      Component Value Date/Time   CALCIUM 9.3 06/08/2018 1022   CALCIUM 9.4 11/03/2017 0930   ALKPHOS 71 06/08/2018 1022   ALKPHOS 84 11/03/2017 0930   AST 17 06/08/2018 1022   AST 20 11/03/2017 0930   ALT 16 06/08/2018 1022   ALT 18 11/03/2017 0930   BILITOT 0.6 06/08/2018 1022   BILITOT 0.64 11/03/2017 0930      All questions were answered. The patient knows to call the clinic with any problems, questions or concerns. No barriers to learning was detected.  I spent 15 minutes counseling the patient face to face. The total time spent in the appointment was 20 minutes and more than 50%  was on counseling and review of test results  Heath Lark, MD 06/22/2018 8:48 AM

## 2018-06-22 NOTE — Assessment & Plan Note (Signed)
She has moderate dementia and was started on Aricept recently Her sister did not notice any improvement I recommend her sister could contact the patient's neurologist for further management

## 2018-06-22 NOTE — Telephone Encounter (Signed)
Gave avs and calendar per MD ok to see patient before lab

## 2018-06-22 NOTE — Progress Notes (Signed)
Hold Velcade today due to neutropenia. OK to continue Revlimid at home. Discussed with sister, Teressa Senter. Verbalized understanding.

## 2018-06-22 NOTE — Assessment & Plan Note (Signed)
She has intermittent poorly controlled pain I recommend MS Contin twice a day along with oxycodone as needed for breakthrough pain I refilled her prescriptions today

## 2018-06-22 NOTE — Telephone Encounter (Signed)
I review CBC today.  Due to pancytopenia, I recommend holding off Velcade injection.

## 2018-06-22 NOTE — Assessment & Plan Note (Signed)
Recent myeloma panel showed near complete response to treatment but with mildly elevated light chains We have recently increased the dose of Revlimid. She is frail and weak.  I do not feel comfortable increasing the dose of Revlimid further Thankfully, she has no signs of end organ damage She will continue current prescription Revlimid along with Velcade every other week She will continue calcium with vitamin D supplement and Zometa every 3 months She will continue acyclovir for antimicrobial prophylaxis and aspirin for DVT prophylaxis

## 2018-06-22 NOTE — Assessment & Plan Note (Signed)
Her blood pressure is stable with reduced dose prescription of lisinopril.  She will continue the same

## 2018-06-23 DIAGNOSIS — C9002 Multiple myeloma in relapse: Secondary | ICD-10-CM | POA: Diagnosis not present

## 2018-06-24 DIAGNOSIS — C9002 Multiple myeloma in relapse: Secondary | ICD-10-CM | POA: Diagnosis not present

## 2018-06-25 DIAGNOSIS — C9002 Multiple myeloma in relapse: Secondary | ICD-10-CM | POA: Diagnosis not present

## 2018-06-25 MED FILL — DONEPEZIL HCL 10 MG TABLET: 10 | 30 days supply | Qty: 30 | Fill #2

## 2018-06-26 DIAGNOSIS — C9002 Multiple myeloma in relapse: Secondary | ICD-10-CM | POA: Diagnosis not present

## 2018-06-27 DIAGNOSIS — C9002 Multiple myeloma in relapse: Secondary | ICD-10-CM | POA: Diagnosis not present

## 2018-06-28 DIAGNOSIS — C9002 Multiple myeloma in relapse: Secondary | ICD-10-CM | POA: Diagnosis not present

## 2018-06-29 DIAGNOSIS — C9002 Multiple myeloma in relapse: Secondary | ICD-10-CM | POA: Diagnosis not present

## 2018-06-30 DIAGNOSIS — C9002 Multiple myeloma in relapse: Secondary | ICD-10-CM | POA: Diagnosis not present

## 2018-07-01 DIAGNOSIS — C9002 Multiple myeloma in relapse: Secondary | ICD-10-CM | POA: Diagnosis not present

## 2018-07-02 DIAGNOSIS — C9002 Multiple myeloma in relapse: Secondary | ICD-10-CM | POA: Diagnosis not present

## 2018-07-03 DIAGNOSIS — C9002 Multiple myeloma in relapse: Secondary | ICD-10-CM | POA: Diagnosis not present

## 2018-07-04 DIAGNOSIS — C9002 Multiple myeloma in relapse: Secondary | ICD-10-CM | POA: Diagnosis not present

## 2018-07-05 DIAGNOSIS — C9002 Multiple myeloma in relapse: Secondary | ICD-10-CM | POA: Diagnosis not present

## 2018-07-06 ENCOUNTER — Inpatient Hospital Stay: Payer: Medicare HMO

## 2018-07-06 ENCOUNTER — Other Ambulatory Visit: Payer: Self-pay

## 2018-07-06 VITALS — BP 130/75 | HR 68 | Temp 98.0°F | Resp 18

## 2018-07-06 DIAGNOSIS — R531 Weakness: Secondary | ICD-10-CM | POA: Diagnosis not present

## 2018-07-06 DIAGNOSIS — Z9221 Personal history of antineoplastic chemotherapy: Secondary | ICD-10-CM | POA: Diagnosis not present

## 2018-07-06 DIAGNOSIS — C9002 Multiple myeloma in relapse: Secondary | ICD-10-CM | POA: Diagnosis not present

## 2018-07-06 DIAGNOSIS — Z79899 Other long term (current) drug therapy: Secondary | ICD-10-CM | POA: Diagnosis not present

## 2018-07-06 DIAGNOSIS — I10 Essential (primary) hypertension: Secondary | ICD-10-CM | POA: Diagnosis not present

## 2018-07-06 DIAGNOSIS — F0390 Unspecified dementia without behavioral disturbance: Secondary | ICD-10-CM | POA: Diagnosis not present

## 2018-07-06 DIAGNOSIS — Z7982 Long term (current) use of aspirin: Secondary | ICD-10-CM | POA: Diagnosis not present

## 2018-07-06 DIAGNOSIS — C9000 Multiple myeloma not having achieved remission: Secondary | ICD-10-CM

## 2018-07-06 DIAGNOSIS — E893 Postprocedural hypopituitarism: Secondary | ICD-10-CM | POA: Diagnosis not present

## 2018-07-06 DIAGNOSIS — Z5111 Encounter for antineoplastic chemotherapy: Secondary | ICD-10-CM | POA: Diagnosis not present

## 2018-07-06 DIAGNOSIS — C9001 Multiple myeloma in remission: Secondary | ICD-10-CM

## 2018-07-06 LAB — CBC WITH DIFFERENTIAL/PLATELET
BASOS PCT: 2 %
Basophils Absolute: 0 10*3/uL (ref 0.0–0.1)
EOS ABS: 0.1 10*3/uL (ref 0.0–0.5)
EOS PCT: 3 %
HCT: 36.9 % (ref 34.8–46.6)
Hemoglobin: 12.3 g/dL (ref 11.6–15.9)
Lymphocytes Relative: 49 %
Lymphs Abs: 1.2 10*3/uL (ref 0.9–3.3)
MCH: 32 pg (ref 25.1–34.0)
MCHC: 33.4 g/dL (ref 31.5–36.0)
MCV: 95.7 fL (ref 79.5–101.0)
Monocytes Absolute: 0.1 10*3/uL (ref 0.1–0.9)
Monocytes Relative: 5 %
Neutro Abs: 1 10*3/uL — ABNORMAL LOW (ref 1.5–6.5)
Neutrophils Relative %: 41 %
PLATELETS: 127 10*3/uL — AB (ref 145–400)
RBC: 3.85 MIL/uL (ref 3.70–5.45)
RDW: 18.8 % — ABNORMAL HIGH (ref 11.2–14.5)
WBC: 2.4 10*3/uL — ABNORMAL LOW (ref 3.9–10.3)

## 2018-07-06 LAB — CMP (CANCER CENTER ONLY)
ALK PHOS: 55 U/L (ref 38–126)
ALT: 17 U/L (ref 0–44)
ANION GAP: 9 (ref 5–15)
AST: 19 U/L (ref 15–41)
Albumin: 3.4 g/dL — ABNORMAL LOW (ref 3.5–5.0)
BUN: 11 mg/dL (ref 8–23)
CHLORIDE: 107 mmol/L (ref 98–111)
CO2: 30 mmol/L (ref 22–32)
CREATININE: 0.71 mg/dL (ref 0.44–1.00)
Calcium: 8.8 mg/dL — ABNORMAL LOW (ref 8.9–10.3)
GFR, Estimated: >60 mL/min (ref >60)
GLUCOSE: 81 mg/dL (ref 70–99)
Potassium: 3.7 mmol/L (ref 3.5–5.1)
SODIUM: 146 mmol/L — AB (ref 135–145)
Total Bilirubin: 0.7 mg/dL (ref 0.3–1.2)
Total Protein: 5.9 g/dL — ABNORMAL LOW (ref 6.5–8.1)

## 2018-07-06 MED ORDER — PROCHLORPERAZINE MALEATE 10 MG PO TABS
10.0000 mg | ORAL_TABLET | Freq: Once | ORAL | Status: AC
  Administered 2018-07-06: 10 mg via ORAL

## 2018-07-06 MED ORDER — LENALIDOMIDE 15 MG PO CAPS: ORAL_CAPSULE | ORAL | 11 refills | Status: DC

## 2018-07-06 MED ORDER — ZOLEDRONIC ACID 4 MG/5ML IV CONC
3.5000 mg | Freq: Once | INTRAVENOUS | Status: AC
  Administered 2018-07-06: 3.5 mg via INTRAVENOUS
  Filled 2018-07-06: qty 4.38

## 2018-07-06 MED ORDER — PROCHLORPERAZINE MALEATE 10 MG PO TABS
ORAL_TABLET | ORAL | Status: AC
  Filled 2018-07-06: qty 1

## 2018-07-06 MED ORDER — BORTEZOMIB CHEMO SQ INJECTION 3.5 MG (2.5MG/ML)
0.9750 mg/m2 | Freq: Once | INTRAMUSCULAR | Status: AC
  Administered 2018-07-06: 1.5 mg via SUBCUTANEOUS
  Filled 2018-07-06: qty 0.6

## 2018-07-06 MED ORDER — SODIUM CHLORIDE 0.9 % IV SOLN
INTRAVENOUS | Status: DC
  Administered 2018-07-06: 10:00:00 via INTRAVENOUS
  Filled 2018-07-06 (×2): qty 250

## 2018-07-06 NOTE — Progress Notes (Signed)
Per Dr.  Alvy Bimler, okay to treat with ANC of 1.0 and plt of 127

## 2018-07-06 NOTE — Patient Instructions (Addendum)
[place] Discharge Instructions for Patients Receiving Chemotherapy  Today you received the following chemotherapy agents Velcade  To help prevent nausea and vomiting after your treatment, we encourage you to take your nausea medication as directed   If you develop nausea and vomiting that is not controlled by your nausea medication, call the clinic.   BELOW ARE SYMPTOMS THAT SHOULD BE REPORTED IMMEDIATELY:  *FEVER GREATER THAN 100.5 F  *CHILLS WITH OR WITHOUT FEVER  NAUSEA AND VOMITING THAT IS NOT CONTROLLED WITH YOUR NAUSEA MEDICATION  *UNUSUAL SHORTNESS OF BREATH  *UNUSUAL BRUISING OR BLEEDING  TENDERNESS IN MOUTH AND THROAT WITH OR WITHOUT PRESENCE OF ULCERS  *URINARY PROBLEMS  *BOWEL PROBLEMS  UNUSUAL RASH Items with * indicate a potential emergency and should be followed up as soon as possible.  Feel free to call the clinic should you have any questions or concerns. The clinic phone number is [phone].  Please show the [place] at check-in to the Emergency Department and triage nurse.     Zoledronic Acid (Zometa) injection (Hypercalcemia, Oncology) What is this medicine? ZOLEDRONIC ACID (ZOE le dron ik AS id) lowers the amount of calcium loss from bone. It is used to treat too much calcium in your blood from cancer. It is also used to prevent complications of cancer that has spread to the bone. This medicine may be used for other purposes; ask your health care provider or pharmacist if you have questions. COMMON BRAND NAME(S): Zometa What should I tell my health care provider before I take this medicine? They need to know if you have any of these conditions: -aspirin-sensitive asthma -cancer, especially if you are receiving medicines used to treat cancer -dental disease or wear dentures -infection -kidney disease -receiving corticosteroids like dexamethasone or prednisone -an unusual or allergic reaction to zoledronic acid, other  medicines, foods, dyes, or preservatives -pregnant or trying to get pregnant -breast-feeding How should I use this medicine? This medicine is for infusion into a vein. It is given by a health care professional in a hospital or clinic setting. Talk to your pediatrician regarding the use of this medicine in children. Special care may be needed. Overdosage: If you think you have taken too much of this medicine contact a poison control center or emergency room at once. NOTE: This medicine is only for you. Do not share this medicine with others. What if I miss a dose? It is important not to miss your dose. Call your doctor or health care professional if you are unable to keep an appointment. What may interact with this medicine? -certain antibiotics given by injection -NSAIDs, medicines for pain and inflammation, like ibuprofen or naproxen -some diuretics like bumetanide, furosemide -teriparatide -thalidomide This list may not describe all possible interactions. Give your health care provider a list of all the medicines, herbs, non-prescription drugs, or dietary supplements you use. Also tell them if you smoke, drink alcohol, or use illegal drugs. Some items may interact with your medicine. What should I watch for while using this medicine? Visit your doctor or health care professional for regular checkups. It may be some time before you see the benefit from this medicine. Do not stop taking your medicine unless your doctor tells you to. Your doctor may order blood tests or other tests to see how you are doing. Women should inform their doctor if they wish to become pregnant or think they might be pregnant. There is a potential for serious side effects to an  unborn child. Talk to your health care professional or pharmacist for more information. You should make sure that you get enough calcium and vitamin D while you are taking this medicine. Discuss the foods you eat and the vitamins you take with  your health care professional. Some people who take this medicine have severe bone, joint, and/or muscle pain. This medicine may also increase your risk for jaw problems or a broken thigh bone. Tell your doctor right away if you have severe pain in your jaw, bones, joints, or muscles. Tell your doctor if you have any pain that does not go away or that gets worse. Tell your dentist and dental surgeon that you are taking this medicine. You should not have major dental surgery while on this medicine. See your dentist to have a dental exam and fix any dental problems before starting this medicine. Take good care of your teeth while on this medicine. Make sure you see your dentist for regular follow-up appointments. What side effects may I notice from receiving this medicine? Side effects that you should report to your doctor or health care professional as soon as possible: -allergic reactions like skin rash, itching or hives, swelling of the face, lips, or tongue -anxiety, confusion, or depression -breathing problems -changes in vision -eye pain -feeling faint or lightheaded, falls -jaw pain, especially after dental work -mouth sores -muscle cramps, stiffness, or weakness -redness, blistering, peeling or loosening of the skin, including inside the mouth -trouble passing urine or change in the amount of urine Side effects that usually do not require medical attention (report to your doctor or health care professional if they continue or are bothersome): -bone, joint, or muscle pain -constipation -diarrhea -fever -hair loss -irritation at site where injected -loss of appetite -nausea, vomiting -stomach upset -trouble sleeping -trouble swallowing -weak or tired This list may not describe all possible side effects. Call your doctor for medical advice about side effects. You may report side effects to FDA at 1-800-FDA-1088. Where should I keep my medicine? This drug is given in a hospital or  clinic and will not be stored at home. NOTE: This sheet is a summary. It may not cover all possible information. If you have questions about this medicine, talk to your doctor, pharmacist, or health care provider.  2018 [employer]vier/Gold Standard (2014-04-01 14:19:39)

## 2018-07-07 DIAGNOSIS — C9002 Multiple myeloma in relapse: Secondary | ICD-10-CM | POA: Diagnosis not present

## 2018-07-07 LAB — KAPPA/LAMBDA LIGHT CHAINS
KAPPA FREE LGHT CHN: 156.5 mg/L — AB (ref 3.3–19.4)
KAPPA, LAMDA LIGHT CHAIN RATIO: 34.78 — AB (ref 0.26–1.65)
Lambda free light chains: 4.5 mg/L — ABNORMAL LOW (ref 5.7–26.3)

## 2018-07-08 DIAGNOSIS — C9002 Multiple myeloma in relapse: Secondary | ICD-10-CM | POA: Diagnosis not present

## 2018-07-08 LAB — MULTIPLE MYELOMA PANEL, SERUM
ALBUMIN SERPL ELPH-MCNC: 3.3 g/dL (ref 2.9–4.4)
Albumin/Glob SerPl: 1.6 (ref 0.7–1.7)
Alpha 1: 0.2 g/dL (ref 0.0–0.4)
Alpha2 Glob SerPl Elph-Mcnc: 0.7 g/dL (ref 0.4–1.0)
B-GLOBULIN SERPL ELPH-MCNC: 0.9 g/dL (ref 0.7–1.3)
GAMMA GLOB SERPL ELPH-MCNC: 0.4 g/dL (ref 0.4–1.8)
GLOBULIN, TOTAL: 2.2 g/dL (ref 2.2–3.9)
IgA: 213 mg/dL (ref 64–422)
IgG (Immunoglobin G), Serum: 487 mg/dL — ABNORMAL LOW (ref 700–1600)
IgM (Immunoglobulin M), Srm: 6 mg/dL — ABNORMAL LOW (ref 26–217)
M PROTEIN SERPL ELPH-MCNC: 0.2 g/dL — AB
TOTAL PROTEIN ELP: 5.5 g/dL — AB (ref 6.0–8.5)

## 2018-07-09 DIAGNOSIS — C9002 Multiple myeloma in relapse: Secondary | ICD-10-CM | POA: Diagnosis not present

## 2018-07-10 DIAGNOSIS — C9002 Multiple myeloma in relapse: Secondary | ICD-10-CM | POA: Diagnosis not present

## 2018-07-11 DIAGNOSIS — C9002 Multiple myeloma in relapse: Secondary | ICD-10-CM | POA: Diagnosis not present

## 2018-07-12 DIAGNOSIS — C9002 Multiple myeloma in relapse: Secondary | ICD-10-CM | POA: Diagnosis not present

## 2018-07-13 DIAGNOSIS — C9002 Multiple myeloma in relapse: Secondary | ICD-10-CM | POA: Diagnosis not present

## 2018-07-14 DIAGNOSIS — C9002 Multiple myeloma in relapse: Secondary | ICD-10-CM | POA: Diagnosis not present

## 2018-07-15 DIAGNOSIS — C9002 Multiple myeloma in relapse: Secondary | ICD-10-CM | POA: Diagnosis not present

## 2018-07-16 DIAGNOSIS — C9002 Multiple myeloma in relapse: Secondary | ICD-10-CM | POA: Diagnosis not present

## 2018-07-17 DIAGNOSIS — C9002 Multiple myeloma in relapse: Secondary | ICD-10-CM | POA: Diagnosis not present

## 2018-07-18 DIAGNOSIS — C9002 Multiple myeloma in relapse: Secondary | ICD-10-CM | POA: Diagnosis not present

## 2018-07-19 DIAGNOSIS — C9002 Multiple myeloma in relapse: Secondary | ICD-10-CM | POA: Diagnosis not present

## 2018-07-20 ENCOUNTER — Inpatient Hospital Stay: Payer: Medicare HMO

## 2018-07-20 ENCOUNTER — Other Ambulatory Visit: Payer: Self-pay | Admitting: Hematology and Oncology

## 2018-07-20 ENCOUNTER — Telehealth: Payer: Self-pay | Admitting: Hematology and Oncology

## 2018-07-20 ENCOUNTER — Encounter: Payer: Self-pay | Admitting: Hematology and Oncology

## 2018-07-20 ENCOUNTER — Inpatient Hospital Stay (HOSPITAL_BASED_OUTPATIENT_CLINIC_OR_DEPARTMENT_OTHER): Payer: Medicare HMO | Admitting: Hematology and Oncology

## 2018-07-20 ENCOUNTER — Inpatient Hospital Stay: Payer: Medicare HMO | Attending: Hematology and Oncology

## 2018-07-20 DIAGNOSIS — C9001 Multiple myeloma in remission: Secondary | ICD-10-CM

## 2018-07-20 DIAGNOSIS — R600 Localized edema: Secondary | ICD-10-CM

## 2018-07-20 DIAGNOSIS — C9002 Multiple myeloma in relapse: Secondary | ICD-10-CM | POA: Insufficient documentation

## 2018-07-20 DIAGNOSIS — Z79899 Other long term (current) drug therapy: Secondary | ICD-10-CM | POA: Insufficient documentation

## 2018-07-20 DIAGNOSIS — R609 Edema, unspecified: Secondary | ICD-10-CM

## 2018-07-20 DIAGNOSIS — D61818 Other pancytopenia: Secondary | ICD-10-CM

## 2018-07-20 DIAGNOSIS — F0390 Unspecified dementia without behavioral disturbance: Secondary | ICD-10-CM | POA: Diagnosis not present

## 2018-07-20 DIAGNOSIS — M48061 Spinal stenosis, lumbar region without neurogenic claudication: Secondary | ICD-10-CM

## 2018-07-20 DIAGNOSIS — R29898 Other symptoms and signs involving the musculoskeletal system: Secondary | ICD-10-CM

## 2018-07-20 DIAGNOSIS — G893 Neoplasm related pain (acute) (chronic): Secondary | ICD-10-CM | POA: Insufficient documentation

## 2018-07-20 DIAGNOSIS — R531 Weakness: Secondary | ICD-10-CM

## 2018-07-20 DIAGNOSIS — Z7982 Long term (current) use of aspirin: Secondary | ICD-10-CM | POA: Diagnosis not present

## 2018-07-20 DIAGNOSIS — Z5111 Encounter for antineoplastic chemotherapy: Secondary | ICD-10-CM

## 2018-07-20 LAB — CBC WITH DIFFERENTIAL/PLATELET
BASOS ABS: 0 10*3/uL (ref 0.0–0.1)
BASOS PCT: 2 %
Eosinophils Absolute: 0.1 10*3/uL (ref 0.0–0.5)
Eosinophils Relative: 4 %
HCT: 36.5 % (ref 34.8–46.6)
Hemoglobin: 12.2 g/dL (ref 11.6–15.9)
LYMPHS PCT: 39 %
Lymphs Abs: 1.2 10*3/uL (ref 0.9–3.3)
MCH: 32.4 pg (ref 25.1–34.0)
MCHC: 33.5 g/dL (ref 31.5–36.0)
MCV: 96.8 fL (ref 79.5–101.0)
MONO ABS: 0.3 10*3/uL (ref 0.1–0.9)
Monocytes Relative: 11 %
Neutro Abs: 1.3 10*3/uL — ABNORMAL LOW (ref 1.5–6.5)
Neutrophils Relative %: 44 %
Platelets: 125 10*3/uL — ABNORMAL LOW (ref 145–400)
RBC: 3.77 MIL/uL (ref 3.70–5.45)
RDW: 18.4 % — AB (ref 11.2–14.5)
WBC: 3 10*3/uL — AB (ref 3.9–10.3)

## 2018-07-20 LAB — COMPREHENSIVE METABOLIC PANEL
ALBUMIN: 3.6 g/dL (ref 3.5–5.0)
ALT: 16 U/L (ref 0–44)
AST: 18 U/L (ref 15–41)
Alkaline Phosphatase: 62 U/L (ref 38–126)
Anion gap: 11 (ref 5–15)
BUN: 16 mg/dL (ref 8–23)
CHLORIDE: 111 mmol/L (ref 98–111)
CO2: 25 mmol/L (ref 22–32)
CREATININE: 0.78 mg/dL (ref 0.44–1.00)
Calcium: 9 mg/dL (ref 8.9–10.3)
GFR calc Af Amer: >60 mL/min (ref >60)
GLUCOSE: 80 mg/dL (ref 70–99)
POTASSIUM: 3.6 mmol/L (ref 3.5–5.1)
Sodium: 147 mmol/L — ABNORMAL HIGH (ref 135–145)
Total Bilirubin: 0.5 mg/dL (ref 0.3–1.2)
Total Protein: 6.2 g/dL — ABNORMAL LOW (ref 6.5–8.1)

## 2018-07-20 MED ORDER — PROCHLORPERAZINE MALEATE 10 MG PO TABS
ORAL_TABLET | ORAL | Status: AC
  Filled 2018-07-20: qty 1

## 2018-07-20 MED ORDER — BORTEZOMIB CHEMO SQ INJECTION 3.5 MG (2.5MG/ML)
0.9750 mg/m2 | Freq: Once | INTRAMUSCULAR | Status: AC
  Administered 2018-07-20: 1.5 mg via SUBCUTANEOUS
  Filled 2018-07-20: qty 0.6

## 2018-07-20 MED ORDER — PROCHLORPERAZINE MALEATE 10 MG PO TABS
10.0000 mg | ORAL_TABLET | Freq: Once | ORAL | Status: AC
  Administered 2018-07-20: 10 mg via ORAL

## 2018-07-20 MED ORDER — MORPHINE SULFATE ER 15 MG PO TBCR: 15.0000 mg | EXTENDED_RELEASE_TABLET | Freq: Two times a day (BID) | ORAL | 0 refills | Status: DC

## 2018-07-20 MED ORDER — OXYCODONE-ACETAMINOPHEN 5-325 MG PO TABS: 1.0000 | ORAL_TABLET | Freq: Four times a day (QID) | ORAL | 0 refills | Status: DC | PRN

## 2018-07-20 MED FILL — ACYCLOVIR 400 MG TABLET: 400 | 90 days supply | Qty: 90 | Fill #2

## 2018-07-20 MED FILL — LISINOPRIL 40 MG TABLET: 40 | 30 days supply | Qty: 30 | Fill #5

## 2018-07-20 NOTE — Assessment & Plan Note (Signed)
She has intermittent poorly controlled pain I recommend MS Contin twice a day along with oxycodone as needed for breakthrough pain I refilled her prescriptions today

## 2018-07-20 NOTE — Assessment & Plan Note (Signed)
Recent myeloma panel showed near complete response to treatment but with mildly elevated light chains We have recently increased the dose of Revlimid. She is frail and weak.  I do not feel comfortable increasing the dose of Revlimid further Thankfully, she has no signs of end organ damage She will continue current prescription Revlimid along with Velcade every other week She will continue calcium with vitamin D supplement and Zometa every 3 months She will continue acyclovir for antimicrobial prophylaxis and aspirin for DVT prophylaxis

## 2018-07-20 NOTE — Progress Notes (Signed)
[place] OFFICE PROGRESS NOTE  Patient Care Team: Susy Frizzle, MD as PCP - General (Family Medicine)  ASSESSMENT & PLAN:  Multiple myeloma in relapse [facility]) Recent myeloma panel showed near complete response to treatment but with mildly elevated light chains We have recently increased the dose of Revlimid. She is frail and weak.  I do not feel comfortable increasing the dose of Revlimid further Thankfully, she has no signs of end organ damage She will continue current prescription Revlimid along with Velcade every other week She will continue calcium with vitamin D supplement and Zometa every 3 months She will continue acyclovir for antimicrobial prophylaxis and aspirin for DVT prophylaxis   Acquired pancytopenia ([place]) She has intermittent thrombocytopenia and neutropenia, likely due to her disease and treatment side effects We will monitor carefully  Bilateral leg edema She has chronic bilateral intermittent leg swelling This is likely due to poor mobility It could be also due to fluid retention from steroids I recommend monitor carefully and would not recommend diuretic therapy due to high risk of falls  Bilateral leg weakness The patient is prone to falls She also has signs of steroid myopathy I recommend physical therapy evaluation and see if an appropriate walker is needed  Cancer associated pain She has intermittent poorly controlled pain I recommend MS Contin twice a day along with oxycodone as needed for breakthrough pain I refilled her prescriptions today  Dementia She has moderate dementia and was started on Aricept recently Her sister did not notice any improvement I recommend her sister could contact the patient's neurologist for further management   Orders Placed This Encounter  Procedures  . Ambulatory referral to Physical Therapy    Referral Priority:   Routine    Referral Type:   Physical Medicine    Referral Reason:   Specialty  Services Required    Requested Specialty:   Physical Therapy    Number of Visits Requested:   1    INTERVAL HISTORY: Please see below for problem oriented charting. She returns for further follow-up with her sisters She feels well Denies recent infection, fever or chills Denies bone pain Her current prescription pain medicine is controlling her pain well Her gait is unsteady and family members are afraid of high risk of falls  SUMMARY OF ONCOLOGIC HISTORY:   Multiple myeloma in relapse ([place])   01/09/2016 Imaging    MRI back showed Interval development of diffuse bone marrow infiltration by innumerable small lesions most consistent with multiple myeloma    01/23/2016 Bone Marrow Biopsy    Accession: [id number] Bone marrow biopsy showed 51% plasma cells    01/23/2016 Imaging    Skeletal survey showed lytic lesions    01/23/2016 Pathology Results    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 Revlimid was stopped in July and resumed in October    04/01/2016 Adverse Reaction    Treatment was placed on hold temporarily due to neutropenia     REVIEW OF SYSTEMS:   Constitutional: Denies fevers, chills or abnormal weight loss Eyes: Denies blurriness of vision Ears, nose, mouth, throat, and face: Denies mucositis or sore throat Respiratory: Denies cough, dyspnea or wheezes Cardiovascular: Denies palpitation, chest discomfort or lower extremity swelling Gastrointestinal:  Denies nausea, heartburn or change in bowel habits Skin: Denies abnormal skin rashes Lymphatics: Denies new lymphadenopathy or easy bruising Neurological:Denies numbness, tingling or new  weaknesses Behavioral/Psych: Mood is stable, no new changes  All other systems were reviewed with the patient and are negative.  I have reviewed the past medical history, past surgical history, social history and family history with the patient and  they are unchanged from previous note.  ALLERGIES:  is allergic to pravastatin and zocor [simvastatin].  MEDICATIONS:  Current Outpatient Medications  Medication Sig Dispense Refill  . acyclovir (ZOVIRAX) 400 MG tablet Take 1 tablet (400 mg total) by mouth daily. 90 tablet 11  . aspirin EC 81 MG tablet Take 81 mg by mouth daily.    . bortezomib IV (VELCADE) 3.5 MG injection Inject 1.3 mg/m2 into the vein once. Once every other week    . Cholecalciferol (VITAMIN D PO) Take by mouth.    . dexamethasone (DECADRON) 1 MG tablet TAKE 1 TABLET BY MOUTH ONCE DAILY 90 tablet 1  . donepezil (ARICEPT) 10 MG tablet Take 1/2 tablet daily for 2 weeks, then increase to 1 tablet daily 30 tablet 11  . lenalidomide (REVLIMID) 15 MG capsule Take 1 every day by mouth for 21 days, then off 7 days 21 capsule 11  . loperamide (IMODIUM) 2 MG capsule Take 1 capsule (2 mg total) by mouth 4 (four) times daily as needed for diarrhea or loose stools. 12 capsule 0  . morphine (MS CONTIN) 15 MG 12 hr tablet Take 1 tablet (15 mg total) by mouth every 12 (twelve) hours. 60 tablet 0  . oxyCODONE-acetaminophen (ROXICET) 5-325 MG tablet Take 1 tablet by mouth every 6 (six) hours as needed for severe pain. 90 tablet 0   No current facility-administered medications for this visit.     PHYSICAL EXAMINATION: ECOG PERFORMANCE STATUS: 2 - Symptomatic, <50% confined to bed  Vitals:   07/20/18 0826  BP: (!) 142/68  Pulse: 65  Resp: 17  Temp: 97.8 F (36.6 C)  SpO2: 100%   Filed Weights   07/20/18 0826  Weight: 141 lb 3.2 oz (64 kg)    GENERAL:alert, no distress and comfortable SKIN: skin color, texture, turgor are normal, no rashes or significant lesions EYES: normal, Conjunctiva are pink and non-injected, sclera clear OROPHARYNX:no exudate, no erythema and lips, buccal mucosa, and tongue normal  NECK: supple, thyroid normal size, non-tender, without nodularity LYMPH:  no palpable lymphadenopathy in the cervical,  axillary or inguinal LUNGS: clear to auscultation and percussion with normal breathing effort HEART: regular rate & rhythm and no murmurs with moderate bilateral lower extremity edema ABDOMEN:abdomen soft, non-tender and normal bowel sounds Musculoskeletal:no cyanosis of digits and no clubbing  NEURO: alert & oriented x 3 with fluent speech, no focal motor/sensory deficits  LABORATORY DATA:  I have reviewed the data as listed    Component Value Date/Time   NA 147 (H) 07/20/2018 0837   NA 145 11/03/2017 0930   K 3.6 07/20/2018 0837   K 3.6 11/03/2017 0930   CL 111 07/20/2018 0837   CO2 25 07/20/2018 0837   CO2 27 11/03/2017 0930   GLUCOSE 80 07/20/2018 0837   GLUCOSE 112 11/03/2017 0930   BUN 16 07/20/2018 0837   BUN 11 12/08/2017 1551   BUN 13.4 11/03/2017 0930   CREATININE 0.78 07/20/2018 0837   CREATININE 0.71 07/06/2018 0912   CREATININE 0.9 11/03/2017 0930   CALCIUM 9.0 07/20/2018 0837   CALCIUM 9.4 11/03/2017 0930   PROT 6.2 (L) 07/20/2018 0837   PROT 6.2 11/03/2017 0930   PROT 6.9 11/03/2017 0930   ALBUMIN 3.6 07/20/2018 0837  ALBUMIN 3.2 (L) 11/03/2017 0930   AST 18 07/20/2018 0837   AST 19 07/06/2018 0912   AST 20 11/03/2017 0930   ALT 16 07/20/2018 0837   ALT 17 07/06/2018 0912   ALT 18 11/03/2017 0930   ALKPHOS 62 07/20/2018 0837   ALKPHOS 84 11/03/2017 0930   BILITOT 0.5 07/20/2018 0837   BILITOT 0.7 07/06/2018 0912   BILITOT 0.64 11/03/2017 0930   GFRNONAA >60 07/20/2018 0837   GFRNONAA >60 07/06/2018 0912   GFRNONAA 64 01/17/2016 0910   GFRAA >60 07/20/2018 0837   GFRAA >60 07/06/2018 0912   GFRAA 74 01/17/2016 0910    No results found for: SPEP, UPEP  Lab Results  Component Value Date   WBC 3.0 (L) 07/20/2018   NEUTROABS 1.3 (L) 07/20/2018   HGB 12.2 07/20/2018   HCT 36.5 07/20/2018   MCV 96.8 07/20/2018   PLT 125 (L) 07/20/2018      Chemistry      Component Value Date/Time   NA 147 (H) 07/20/2018 0837   NA 145 11/03/2017 0930   K  3.6 07/20/2018 0837   K 3.6 11/03/2017 0930   CL 111 07/20/2018 0837   CO2 25 07/20/2018 0837   CO2 27 11/03/2017 0930   BUN 16 07/20/2018 0837   BUN 11 12/08/2017 1551   BUN 13.4 11/03/2017 0930   CREATININE 0.78 07/20/2018 0837   CREATININE 0.71 07/06/2018 0912   CREATININE 0.9 11/03/2017 0930      Component Value Date/Time   CALCIUM 9.0 07/20/2018 0837   CALCIUM 9.4 11/03/2017 0930   ALKPHOS 62 07/20/2018 0837   ALKPHOS 84 11/03/2017 0930   AST 18 07/20/2018 0837   AST 19 07/06/2018 0912   AST 20 11/03/2017 0930   ALT 16 07/20/2018 0837   ALT 17 07/06/2018 0912   ALT 18 11/03/2017 0930   BILITOT 0.5 07/20/2018 0837   BILITOT 0.7 07/06/2018 0912   BILITOT 0.64 11/03/2017 0930      All questions were answered. The patient knows to call the clinic with any problems, questions or concerns. No barriers to learning was detected.  I spent 20 minutes counseling the patient face to face. The total time spent in the appointment was 25 minutes and more than 50% was on counseling and review of test results  Heath Lark, MD 07/20/2018 11:26 AM

## 2018-07-20 NOTE — Patient Instructions (Signed)
[facility] Discharge Instructions for Patients Receiving Chemotherapy  Today you received the following chemotherapy agents Velcade  To help prevent nausea and vomiting after your treatment, we encourage you to take your nausea medication as directed   If you develop nausea and vomiting that is not controlled by your nausea medication, call the clinic.   BELOW ARE SYMPTOMS THAT SHOULD BE REPORTED IMMEDIATELY:  *FEVER GREATER THAN 100.5 F  *CHILLS WITH OR WITHOUT FEVER  NAUSEA AND VOMITING THAT IS NOT CONTROLLED WITH YOUR NAUSEA MEDICATION  *UNUSUAL SHORTNESS OF BREATH  *UNUSUAL BRUISING OR BLEEDING  TENDERNESS IN MOUTH AND THROAT WITH OR WITHOUT PRESENCE OF ULCERS  *URINARY PROBLEMS  *BOWEL PROBLEMS  UNUSUAL RASH Items with * indicate a potential emergency and should be followed up as soon as possible.  Feel free to call the clinic should you have any questions or concerns. The clinic phone number is [phone].  Please show the [place] at check-in to the Emergency Department and triage nurse.     Zoledronic Acid (Zometa) injection (Hypercalcemia, Oncology) What is this medicine? ZOLEDRONIC ACID (ZOE le dron ik AS id) lowers the amount of calcium loss from bone. It is used to treat too much calcium in your blood from cancer. It is also used to prevent complications of cancer that has spread to the bone. This medicine may be used for other purposes; ask your health care provider or pharmacist if you have questions. COMMON BRAND NAME(S): Zometa What should I tell my health care provider before I take this medicine? They need to know if you have any of these conditions: -aspirin-sensitive asthma -cancer, especially if you are receiving medicines used to treat cancer -dental disease or wear dentures -infection -kidney disease -receiving corticosteroids like dexamethasone or prednisone -an unusual or allergic reaction to zoledronic acid, other  medicines, foods, dyes, or preservatives -pregnant or trying to get pregnant -breast-feeding How should I use this medicine? This medicine is for infusion into a vein. It is given by a health care professional in a hospital or clinic setting. Talk to your pediatrician regarding the use of this medicine in children. Special care may be needed. Overdosage: If you think you have taken too much of this medicine contact a poison control center or emergency room at once. NOTE: This medicine is only for you. Do not share this medicine with others. What if I miss a dose? It is important not to miss your dose. Call your doctor or health care professional if you are unable to keep an appointment. What may interact with this medicine? -certain antibiotics given by injection -NSAIDs, medicines for pain and inflammation, like ibuprofen or naproxen -some diuretics like bumetanide, furosemide -teriparatide -thalidomide This list may not describe all possible interactions. Give your health care provider a list of all the medicines, herbs, non-prescription drugs, or dietary supplements you use. Also tell them if you smoke, drink alcohol, or use illegal drugs. Some items may interact with your medicine. What should I watch for while using this medicine? Visit your doctor or health care professional for regular checkups. It may be some time before you see the benefit from this medicine. Do not stop taking your medicine unless your doctor tells you to. Your doctor may order blood tests or other tests to see how you are doing. Women should inform their doctor if they wish to become pregnant or think they might be pregnant. There is a potential for serious side effects to an  unborn child. Talk to your health care professional or pharmacist for more information. You should make sure that you get enough calcium and vitamin D while you are taking this medicine. Discuss the foods you eat and the vitamins you take with  your health care professional. Some people who take this medicine have severe bone, joint, and/or muscle pain. This medicine may also increase your risk for jaw problems or a broken thigh bone. Tell your doctor right away if you have severe pain in your jaw, bones, joints, or muscles. Tell your doctor if you have any pain that does not go away or that gets worse. Tell your dentist and dental surgeon that you are taking this medicine. You should not have major dental surgery while on this medicine. See your dentist to have a dental exam and fix any dental problems before starting this medicine. Take good care of your teeth while on this medicine. Make sure you see your dentist for regular follow-up appointments. What side effects may I notice from receiving this medicine? Side effects that you should report to your doctor or health care professional as soon as possible: -allergic reactions like skin rash, itching or hives, swelling of the face, lips, or tongue -anxiety, confusion, or depression -breathing problems -changes in vision -eye pain -feeling faint or lightheaded, falls -jaw pain, especially after dental work -mouth sores -muscle cramps, stiffness, or weakness -redness, blistering, peeling or loosening of the skin, including inside the mouth -trouble passing urine or change in the amount of urine Side effects that usually do not require medical attention (report to your doctor or health care professional if they continue or are bothersome): -bone, joint, or muscle pain -constipation -diarrhea -fever -hair loss -irritation at site where injected -loss of appetite -nausea, vomiting -stomach upset -trouble sleeping -trouble swallowing -weak or tired This list may not describe all possible side effects. Call your doctor for medical advice about side effects. You may report side effects to FDA at 1-800-FDA-1088. Where should I keep my medicine? This drug is given in a hospital or  clinic and will not be stored at home. NOTE: This sheet is a summary. It may not cover all possible information. If you have questions about this medicine, talk to your doctor, pharmacist, or health care provider.  2018 Elsevier/Gold Standard (2014-04-01 14:19:39)

## 2018-07-20 NOTE — Telephone Encounter (Signed)
Gave pt avs and calendar  °

## 2018-07-20 NOTE — Assessment & Plan Note (Signed)
She has chronic bilateral intermittent leg swelling This is likely due to poor mobility It could be also due to fluid retention from steroids I recommend monitor carefully and would not recommend diuretic therapy due to high risk of falls

## 2018-07-20 NOTE — Assessment & Plan Note (Signed)
She has moderate dementia and was started on Aricept recently Her sister did not notice any improvement I recommend her sister could contact the patient's neurologist for further management

## 2018-07-20 NOTE — Assessment & Plan Note (Signed)
The patient is prone to falls She also has signs of steroid myopathy I recommend physical therapy evaluation and see if an appropriate walker is needed

## 2018-07-20 NOTE — Assessment & Plan Note (Signed)
She has intermittent thrombocytopenia and neutropenia, likely due to her disease and treatment side effects We will monitor carefully

## 2018-07-21 DIAGNOSIS — C9002 Multiple myeloma in relapse: Secondary | ICD-10-CM | POA: Diagnosis not present

## 2018-07-22 DIAGNOSIS — C9002 Multiple myeloma in relapse: Secondary | ICD-10-CM | POA: Diagnosis not present

## 2018-07-23 DIAGNOSIS — C9002 Multiple myeloma in relapse: Secondary | ICD-10-CM | POA: Diagnosis not present

## 2018-07-24 DIAGNOSIS — C9002 Multiple myeloma in relapse: Secondary | ICD-10-CM | POA: Diagnosis not present

## 2018-07-25 DIAGNOSIS — C9002 Multiple myeloma in relapse: Secondary | ICD-10-CM | POA: Diagnosis not present

## 2018-07-28 MED FILL — DONEPEZIL HCL 10 MG TABLET: 10 | 30 days supply | Qty: 30 | Fill #3

## 2018-07-30 ENCOUNTER — Ambulatory Visit: Payer: Medicare HMO | Attending: Hematology and Oncology | Admitting: Physical Therapy

## 2018-07-30 DIAGNOSIS — M48061 Spinal stenosis, lumbar region without neurogenic claudication: Secondary | ICD-10-CM | POA: Diagnosis not present

## 2018-07-30 DIAGNOSIS — R2681 Unsteadiness on feet: Secondary | ICD-10-CM | POA: Diagnosis not present

## 2018-07-30 DIAGNOSIS — R29898 Other symptoms and signs involving the musculoskeletal system: Secondary | ICD-10-CM | POA: Diagnosis not present

## 2018-07-30 DIAGNOSIS — R262 Difficulty in walking, not elsewhere classified: Secondary | ICD-10-CM

## 2018-07-30 NOTE — Therapy (Signed)
[place], [place], [address] Phone: [phone]   Fax:  [phone]  Physical Therapy Evaluation  Patient Details  Name: Kimberly Friedman MRN: [id number] Date of Birth: 12-Mar-1936 Referring Provider: Heath Lark, MD   Encounter Date: 07/30/2018  PT End of Session - 07/30/18 1027    Visit Number  1    Number of Visits  8    Date for PT Re-Evaluation  09/10/18    Authorization Type  MCR    PT Start Time  0830    PT Stop Time  0925    PT Time Calculation (min)  55 min    Activity Tolerance  Patient tolerated treatment well;Patient limited by fatigue    Behavior During Therapy  --   difficulty following commands      Past Medical History:  Diagnosis Date  . DDD (degenerative disc disease), lumbar   . Hypertension   . Multiple myeloma ([place]) 01/21/2016  . PMR (polymyalgia rheumatica) ([facility])     Past Surgical History:  Procedure Laterality Date  . HEMORRHOID SURGERY      There were no vitals filed for this visit.   Subjective Assessment - 07/30/18 0937    Subjective  Pt is poor historian, she first says she has a lot of pain, and then she says she is not having pain. She has dementia and her sister was present to help answer questions and take history. Her sister does relay that patient may need AD for ambulation due to falls risk, unsteady of her feet, and fatiques quickly. She is also having CA treatments which can be contibuting to her weakness, and pain.    Patient is accompained by:  Family member   sister   Pertinent History  PMH: CA (multiple myeloma in relapse),mod dementia    Limitations  Standing;Walking;House hold activities    How long can you stand comfortably?  2 min    How long can you walk comfortably?  2-3 min    Patient Stated Goals  get stronger and walk better    Currently in Pain?  Yes    Pain Score  7     Pain Location  Back    Pain Orientation  Lower;Mid    Pain Descriptors /  Indicators  Aching    Pain Type  Chronic pain    Pain Onset  More than a month ago    Pain Frequency  Intermittent    Multiple Pain Sites  No         [facility] PT Assessment - 07/30/18 0001      Assessment   Medical Diagnosis  spinal stenosis, balance and gait deficits, falls risk    Referring Provider  Heath Lark, MD    Hand Dominance  Right    Next MD Visit  --   next week   Prior Therapy  no PT      Precautions   Precautions  Fall      Balance Screen   Has the patient fallen in the past 6 months  --   maybe, she denies but has dementia and family thinks she has     [place] residence    Home Equipment  --   Pt will need rollator or RW for safety   Additional Comments  independent residence but gets assistance with ADL's      Prior Function   Level of Independence  Needs assistance  with ADLs   can dress her self but needs A with other ADL's and transfer     Cognition   Overall Cognitive Status  History of cognitive impairments - at baseline   dementia     Observation/Other Assessments   Focus on Therapeutic Outcomes (FOTO)   not done due to dementia, poor historian      Posture/Postural Control   Posture Comments  slumped posture, fwd head, T kyphosis      ROM / Strength   AROM / PROM / Strength  AROM;Strength      AROM   AROM Assessment Site  Lumbar    Lumbar Flexion  WFL    Lumbar Extension  25%    Lumbar - Right Side Bend  75%    Lumbar - Left Side Bend  75%    Lumbar - Right Rotation  50%    Lumbar - Left Rotation  50%      Strength   Overall Strength Comments  LE strength 4/5 MMT grossly      Ambulation/Gait   Gait Comments  unsteady, wide BOS      Balance   Balance Assessed  Yes      Standardized Balance Assessment   Standardized Balance Assessment  Berg Balance Test;Five Times Sit to Stand    Five times sit to stand comments   22      Berg Balance Test   Sit to Stand  Able to stand  independently using  hands    Standing Unsupported  Able to stand 2 minutes with supervision    Sitting with Back Unsupported but Feet Supported on Floor or Stool  Able to sit safely and securely 2 minutes    Stand to Sit  Controls descent by using hands    Transfers  Able to transfer safely, definite need of hands    Standing Unsupported with Eyes Closed  Able to stand 10 seconds with supervision    Standing Ubsupported with Feet Together  Able to place feet together independently but unable to hold for 30 seconds    From Standing, Reach Forward with Outstretched Arm  Can reach forward >5 cm safely (2")    From Standing Position, Pick up Object from Floor  Able to pick up shoe, needs supervision    From Standing Position, Turn to Look Behind Over each Shoulder  Looks behind one side only/other side shows less weight shift    Turn 360 Degrees  Needs close supervision or verbal cueing    Standing Unsupported, Alternately Place Feet on Step/Stool  Able to complete 4 steps without aid or supervision    Standing Unsupported, One Foot in [employer] balance while stepping or standing    Standing on One Leg  Unable to try or needs assist to prevent fall    Total Score  32                Objective measurements completed on examination: See above findings.              PT Education - 07/30/18 0947    Education Details  HEP, need for AD    Person(s) Educated  Patient    Methods  Explanation;Demonstration;Verbal cues;Handout    Comprehension  Verbalized understanding;Need further instruction          PT Long Term Goals - 07/30/18 1021      PT LONG TERM GOAL #1   Title  Pt will be I and compliant  with HEP. 6 weeks 09/10/18    Status  New      PT LONG TERM GOAL #2   Title  Pt will improve LE strength to at least 4+/5 MMT. 6 weeks 09/10/18    Status  New      PT LONG TERM GOAL #3   Title  Pt will improve BERG balance to at least 45/56 to show improved balance and decrease falls risk. 6  weeks 09/10/18    Status  New             Plan - 07/30/18 0951    Clinical Impression Statement  Pt presents with back pain and lumbar stenosis, bilat leg weakness and edema, falls risk, Ca associated pain. Balance tests today place patient at a high falls risk therefore Assistive device is recommended for patient. She fatigues quickly therefore rollator would be most beneficial for her. She was trialed with [facility] but she has difficulty following commands and had difficulyt with technique making balance more unsteady. She will also benefit from skilled PT to address her deficits listed below.    History and Personal Factors relevant to plan of care:   CA (multiple myeloma in relapse),mod dementia    Clinical Presentation  Unstable    Clinical Presentation due to:   CA (multiple myeloma in relapse),mod dementia    Clinical Decision Making  High    Rehab Potential  Fair    PT Frequency  2x / week    PT Duration  6 weeks    PT Treatment/Interventions  Cryotherapy;Moist Heat;Gait training;Stair training;Functional mobility training;Therapeutic exercise;Balance training;Neuromuscular re-education;Manual techniques;Energy conservation;Passive range of motion       Patient will benefit from skilled therapeutic intervention in order to improve the following deficits and impairments:  Abnormal gait, Decreased activity tolerance, Decreased balance, Decreased endurance, Decreased safety awareness, Decreased range of motion, Decreased strength, Difficulty walking, Hypomobility, Increased edema, Pain, Decreased cognition, Decreased mobility  Visit Diagnosis: Bilateral leg weakness  Difficulty walking  Spinal stenosis of lumbar region without neurogenic claudication  Gait instability     Problem List Patient Active Problem List   Diagnosis Date Noted  . Dementia 09/22/2017  . Fall at home, initial encounter 09/08/2017  . Epistaxis 03/31/2017  . Viral illness 11/13/2016  . Goals of care,  counseling/discussion 08/19/2016  . Peripheral edema 08/06/2016  . Bilateral leg edema 04/08/2016  . Drug-induced neutropenia ([place]) 04/08/2016  . Memory deficits 04/08/2016  . Anemia due to antineoplastic chemotherapy 02/19/2016  . Protein-calorie malnutrition, mild ([place]) 02/19/2016  . Hypotension due to drugs 01/22/2016  . Cancer associated pain 01/22/2016  . Malignant cachexia ([place]) 01/22/2016  . Acquired pancytopenia ([place]) 01/22/2016  . Multiple myeloma in relapse ([place]) 01/21/2016  . PMR (polymyalgia rheumatica) ([facility])   . DDD (degenerative disc disease), lumbar   . Essential hypertension   . Bilateral leg weakness 01/25/2013  . Difficulty in walking(719.7) 01/25/2013    Debbe Odea, PT, DPT 07/30/2018, 10:30 AM  [facility] [address] [place], [place], [address] Phone: [phone]   Fax:  [phone]  Name: Kimberly Friedman MRN: [id number] Date of Birth: 27-Jan-1936

## 2018-08-02 DIAGNOSIS — C9002 Multiple myeloma in relapse: Secondary | ICD-10-CM | POA: Diagnosis not present

## 2018-08-03 ENCOUNTER — Inpatient Hospital Stay: Payer: Medicare HMO

## 2018-08-03 VITALS — BP 149/68 | HR 54 | Temp 98.5°F | Resp 16

## 2018-08-03 DIAGNOSIS — C9002 Multiple myeloma in relapse: Secondary | ICD-10-CM

## 2018-08-03 DIAGNOSIS — Z79899 Other long term (current) drug therapy: Secondary | ICD-10-CM | POA: Diagnosis not present

## 2018-08-03 DIAGNOSIS — R609 Edema, unspecified: Secondary | ICD-10-CM | POA: Diagnosis not present

## 2018-08-03 DIAGNOSIS — Z5111 Encounter for antineoplastic chemotherapy: Secondary | ICD-10-CM | POA: Diagnosis not present

## 2018-08-03 DIAGNOSIS — R531 Weakness: Secondary | ICD-10-CM | POA: Diagnosis not present

## 2018-08-03 DIAGNOSIS — F0390 Unspecified dementia without behavioral disturbance: Secondary | ICD-10-CM | POA: Diagnosis not present

## 2018-08-03 DIAGNOSIS — G893 Neoplasm related pain (acute) (chronic): Secondary | ICD-10-CM | POA: Diagnosis not present

## 2018-08-03 DIAGNOSIS — D61818 Other pancytopenia: Secondary | ICD-10-CM | POA: Diagnosis not present

## 2018-08-03 DIAGNOSIS — Z7982 Long term (current) use of aspirin: Secondary | ICD-10-CM | POA: Diagnosis not present

## 2018-08-03 DIAGNOSIS — C9001 Multiple myeloma in remission: Secondary | ICD-10-CM

## 2018-08-03 LAB — COMPREHENSIVE METABOLIC PANEL
ALT: 16 U/L (ref 0–44)
AST: 19 U/L (ref 15–41)
Albumin: 3.5 g/dL (ref 3.5–5.0)
Alkaline Phosphatase: 55 U/L (ref 38–126)
Anion gap: 11 (ref 5–15)
BUN: 11 mg/dL (ref 8–23)
CHLORIDE: 108 mmol/L (ref 98–111)
CO2: 29 mmol/L (ref 22–32)
CREATININE: 0.78 mg/dL (ref 0.44–1.00)
Calcium: 9 mg/dL (ref 8.9–10.3)
GFR calc Af Amer: >60 mL/min (ref >60)
GFR calc non Af Amer: >60 mL/min (ref >60)
Glucose, Bld: 81 mg/dL (ref 70–99)
Potassium: 3.5 mmol/L (ref 3.5–5.1)
SODIUM: 148 mmol/L — AB (ref 135–145)
Total Bilirubin: 0.6 mg/dL (ref 0.3–1.2)
Total Protein: 6.1 g/dL — ABNORMAL LOW (ref 6.5–8.1)

## 2018-08-03 LAB — CBC WITH DIFFERENTIAL/PLATELET
BASOS ABS: 0.1 10*3/uL (ref 0.0–0.1)
Basophils Relative: 2 %
EOS ABS: 0.1 10*3/uL (ref 0.0–0.5)
EOS PCT: 2 %
HCT: 38.9 % (ref 34.8–46.6)
Hemoglobin: 12.4 g/dL (ref 11.6–15.9)
LYMPHS PCT: 44 %
Lymphs Abs: 1.5 10*3/uL (ref 0.9–3.3)
MCH: 31.8 pg (ref 25.1–34.0)
MCHC: 31.9 g/dL (ref 31.5–36.0)
MCV: 99.7 fL (ref 79.5–101.0)
Monocytes Absolute: 0.2 10*3/uL (ref 0.1–0.9)
Monocytes Relative: 5 %
Neutro Abs: 1.7 10*3/uL (ref 1.5–6.5)
Neutrophils Relative %: 47 %
Platelets: 153 10*3/uL (ref 145–400)
RBC: 3.9 MIL/uL (ref 3.70–5.45)
RDW: 17.7 % — ABNORMAL HIGH (ref 11.2–14.5)
WBC: 3.5 10*3/uL — AB (ref 3.9–10.3)

## 2018-08-03 MED ORDER — BORTEZOMIB CHEMO SQ INJECTION 3.5 MG (2.5MG/ML)
0.9750 mg/m2 | Freq: Once | INTRAMUSCULAR | Status: AC
  Administered 2018-08-03: 1.5 mg via SUBCUTANEOUS
  Filled 2018-08-03: qty 0.6

## 2018-08-03 MED ORDER — PROCHLORPERAZINE MALEATE 10 MG PO TABS
10.0000 mg | ORAL_TABLET | Freq: Once | ORAL | Status: AC
  Administered 2018-08-03: 10 mg via ORAL

## 2018-08-03 MED ORDER — PROCHLORPERAZINE MALEATE 10 MG PO TABS
ORAL_TABLET | ORAL | Status: AC
  Filled 2018-08-03: qty 1

## 2018-08-03 NOTE — Patient Instructions (Signed)
[facility] Discharge Instructions for Patients Receiving Chemotherapy  Today you received the following chemotherapy agents Velcade To help prevent nausea and vomiting after your treatment, we encourage you to take your nausea medication as prescribed.   If you develop nausea and vomiting that is not controlled by your nausea medication, call the clinic.   BELOW ARE SYMPTOMS THAT SHOULD BE REPORTED IMMEDIATELY:  *FEVER GREATER THAN 100.5 F  *CHILLS WITH OR WITHOUT FEVER  NAUSEA AND VOMITING THAT IS NOT CONTROLLED WITH YOUR NAUSEA MEDICATION  *UNUSUAL SHORTNESS OF BREATH  *UNUSUAL BRUISING OR BLEEDING  TENDERNESS IN MOUTH AND THROAT WITH OR WITHOUT PRESENCE OF ULCERS  *URINARY PROBLEMS  *BOWEL PROBLEMS  UNUSUAL RASH Items with * indicate a potential emergency and should be followed up as soon as possible.  Feel free to call the clinic should you have any questions or concerns. The clinic phone number is [phone].  Please show the CHEMO ALERT CARD at check-in to the Emergency Department and triage nurse.   

## 2018-08-04 DIAGNOSIS — C9002 Multiple myeloma in relapse: Secondary | ICD-10-CM | POA: Diagnosis not present

## 2018-08-04 LAB — MULTIPLE MYELOMA PANEL, SERUM
ALBUMIN/GLOB SERPL: 1.5 (ref 0.7–1.7)
ALPHA2 GLOB SERPL ELPH-MCNC: 0.7 g/dL (ref 0.4–1.0)
Albumin SerPl Elph-Mcnc: 3.3 g/dL (ref 2.9–4.4)
Alpha 1: 0.2 g/dL (ref 0.0–0.4)
B-GLOBULIN SERPL ELPH-MCNC: 1 g/dL (ref 0.7–1.3)
Gamma Glob SerPl Elph-Mcnc: 0.4 g/dL (ref 0.4–1.8)
Globulin, Total: 2.3 g/dL (ref 2.2–3.9)
IGG (IMMUNOGLOBIN G), SERUM: 458 mg/dL — AB (ref 700–1600)
IGM (IMMUNOGLOBULIN M), SRM: 7 mg/dL — AB (ref 26–217)
IgA: 200 mg/dL (ref 64–422)
M Protein SerPl Elph-Mcnc: 0.2 g/dL — ABNORMAL HIGH
Total Protein ELP: 5.6 g/dL — ABNORMAL LOW (ref 6.0–8.5)

## 2018-08-04 LAB — KAPPA/LAMBDA LIGHT CHAINS
KAPPA FREE LGHT CHN: 145 mg/L — AB (ref 3.3–19.4)
Kappa, lambda light chain ratio: 19.08 — ABNORMAL HIGH (ref 0.26–1.65)
LAMDA FREE LIGHT CHAINS: 7.6 mg/L (ref 5.7–26.3)

## 2018-08-05 DIAGNOSIS — C9002 Multiple myeloma in relapse: Secondary | ICD-10-CM | POA: Diagnosis not present

## 2018-08-06 DIAGNOSIS — C9002 Multiple myeloma in relapse: Secondary | ICD-10-CM | POA: Diagnosis not present

## 2018-08-07 DIAGNOSIS — C9002 Multiple myeloma in relapse: Secondary | ICD-10-CM | POA: Diagnosis not present

## 2018-08-08 DIAGNOSIS — C9002 Multiple myeloma in relapse: Secondary | ICD-10-CM | POA: Diagnosis not present

## 2018-08-09 DIAGNOSIS — C9002 Multiple myeloma in relapse: Secondary | ICD-10-CM | POA: Diagnosis not present

## 2018-08-10 DIAGNOSIS — C9002 Multiple myeloma in relapse: Secondary | ICD-10-CM | POA: Diagnosis not present

## 2018-08-10 MED FILL — MORPHINE SULF ER 15 MG TAB: 15 | 30 days supply | Qty: 60 | Fill #0

## 2018-08-11 ENCOUNTER — Other Ambulatory Visit: Payer: Self-pay

## 2018-08-11 DIAGNOSIS — C9002 Multiple myeloma in relapse: Secondary | ICD-10-CM | POA: Diagnosis not present

## 2018-08-11 MED ORDER — LENALIDOMIDE 15 MG PO CAPS: ORAL_CAPSULE | ORAL | 11 refills | Status: DC

## 2018-08-12 DIAGNOSIS — C9002 Multiple myeloma in relapse: Secondary | ICD-10-CM | POA: Diagnosis not present

## 2018-08-13 DIAGNOSIS — C9002 Multiple myeloma in relapse: Secondary | ICD-10-CM | POA: Diagnosis not present

## 2018-08-14 DIAGNOSIS — C9002 Multiple myeloma in relapse: Secondary | ICD-10-CM | POA: Diagnosis not present

## 2018-08-15 DIAGNOSIS — C9002 Multiple myeloma in relapse: Secondary | ICD-10-CM | POA: Diagnosis not present

## 2018-08-16 DIAGNOSIS — C9002 Multiple myeloma in relapse: Secondary | ICD-10-CM | POA: Diagnosis not present

## 2018-08-17 ENCOUNTER — Inpatient Hospital Stay (HOSPITAL_BASED_OUTPATIENT_CLINIC_OR_DEPARTMENT_OTHER): Payer: Medicare HMO | Admitting: Hematology and Oncology

## 2018-08-17 ENCOUNTER — Inpatient Hospital Stay: Payer: Medicare HMO

## 2018-08-17 ENCOUNTER — Encounter: Payer: Self-pay | Admitting: Hematology and Oncology

## 2018-08-17 ENCOUNTER — Inpatient Hospital Stay: Payer: Medicare HMO | Attending: Hematology and Oncology

## 2018-08-17 DIAGNOSIS — Z9002 Acquired absence of larynx: Secondary | ICD-10-CM

## 2018-08-17 DIAGNOSIS — Z5111 Encounter for antineoplastic chemotherapy: Secondary | ICD-10-CM | POA: Insufficient documentation

## 2018-08-17 DIAGNOSIS — C9002 Multiple myeloma in relapse: Secondary | ICD-10-CM

## 2018-08-17 DIAGNOSIS — R64 Cachexia: Secondary | ICD-10-CM | POA: Insufficient documentation

## 2018-08-17 DIAGNOSIS — G893 Neoplasm related pain (acute) (chronic): Secondary | ICD-10-CM

## 2018-08-17 DIAGNOSIS — C9001 Multiple myeloma in remission: Secondary | ICD-10-CM

## 2018-08-17 DIAGNOSIS — R29898 Other symptoms and signs involving the musculoskeletal system: Secondary | ICD-10-CM

## 2018-08-17 DIAGNOSIS — Z23 Encounter for immunization: Secondary | ICD-10-CM | POA: Diagnosis not present

## 2018-08-17 DIAGNOSIS — Z7982 Long term (current) use of aspirin: Secondary | ICD-10-CM | POA: Insufficient documentation

## 2018-08-17 DIAGNOSIS — D61818 Other pancytopenia: Secondary | ICD-10-CM | POA: Insufficient documentation

## 2018-08-17 DIAGNOSIS — E870 Hyperosmolality and hypernatremia: Secondary | ICD-10-CM | POA: Diagnosis not present

## 2018-08-17 DIAGNOSIS — Z79899 Other long term (current) drug therapy: Secondary | ICD-10-CM | POA: Diagnosis not present

## 2018-08-17 LAB — COMPREHENSIVE METABOLIC PANEL
ALBUMIN: 3.4 g/dL — AB (ref 3.5–5.0)
ALK PHOS: 55 U/L (ref 38–126)
ALT: 22 U/L (ref 0–44)
ANION GAP: 11 (ref 5–15)
AST: 19 U/L (ref 15–41)
BUN: 11 mg/dL (ref 8–23)
CALCIUM: 8.5 mg/dL — AB (ref 8.9–10.3)
CO2: 26 mmol/L (ref 22–32)
CREATININE: 0.78 mg/dL (ref 0.44–1.00)
Chloride: 110 mmol/L (ref 98–111)
GFR calc Af Amer: >60 mL/min (ref >60)
GFR calc non Af Amer: >60 mL/min (ref >60)
GLUCOSE: 100 mg/dL — AB (ref 70–99)
Potassium: 3.6 mmol/L (ref 3.5–5.1)
Sodium: 147 mmol/L — ABNORMAL HIGH (ref 135–145)
Total Bilirubin: 0.8 mg/dL (ref 0.3–1.2)
Total Protein: 5.9 g/dL — ABNORMAL LOW (ref 6.5–8.1)

## 2018-08-17 LAB — CBC WITH DIFFERENTIAL/PLATELET
BASOS ABS: 0.1 10*3/uL (ref 0.0–0.1)
BASOS PCT: 2 %
EOS ABS: 0.2 10*3/uL (ref 0.0–0.5)
Eosinophils Relative: 7 %
HEMATOCRIT: 36.5 % (ref 34.8–46.6)
Hemoglobin: 11.9 g/dL (ref 11.6–15.9)
Lymphocytes Relative: 45 %
Lymphs Abs: 1.5 10*3/uL (ref 0.9–3.3)
MCH: 32.2 pg (ref 25.1–34.0)
MCHC: 32.7 g/dL (ref 31.5–36.0)
MCV: 98.6 fL (ref 79.5–101.0)
Monocytes Absolute: 0.2 10*3/uL (ref 0.1–0.9)
Monocytes Relative: 6 %
NEUTROS ABS: 1.4 10*3/uL — AB (ref 1.5–6.5)
NEUTROS PCT: 40 %
Platelets: 92 10*3/uL — ABNORMAL LOW (ref 145–400)
RBC: 3.7 MIL/uL (ref 3.70–5.45)
RDW: 18.9 % — ABNORMAL HIGH (ref 11.2–14.5)
WBC: 3.4 10*3/uL — AB (ref 3.9–10.3)

## 2018-08-17 MED ORDER — INFLUENZA VAC SPLIT QUAD 0.5 ML IM SUSY
PREFILLED_SYRINGE | INTRAMUSCULAR | Status: AC
  Filled 2018-08-17: qty 0.5

## 2018-08-17 MED ORDER — PROCHLORPERAZINE MALEATE 10 MG PO TABS
ORAL_TABLET | ORAL | Status: AC
  Filled 2018-08-17: qty 1

## 2018-08-17 MED ORDER — PROCHLORPERAZINE MALEATE 10 MG PO TABS
10.0000 mg | ORAL_TABLET | Freq: Once | ORAL | Status: AC
  Administered 2018-08-17: 10 mg via ORAL

## 2018-08-17 MED ORDER — BORTEZOMIB CHEMO SQ INJECTION 3.5 MG (2.5MG/ML)
0.9750 mg/m2 | Freq: Once | INTRAMUSCULAR | Status: AC
  Administered 2018-08-17: 1.5 mg via SUBCUTANEOUS
  Filled 2018-08-17: qty 0.6

## 2018-08-17 MED ORDER — INFLUENZA VAC SPLIT QUAD 0.5 ML IM SUSY
0.5000 mL | PREFILLED_SYRINGE | Freq: Once | INTRAMUSCULAR | Status: AC
  Administered 2018-08-17: 0.5 mL via INTRAMUSCULAR

## 2018-08-17 MED FILL — DEXAMETHASONE 1 MG TABLET: 1 | 90 days supply | Qty: 90 | Fill #1

## 2018-08-17 NOTE — Assessment & Plan Note (Signed)
Recent myeloma panel showed near complete response to treatment but with mildly elevated light chains We have recently increased the dose of Revlimid. She is frail and weak.  I do not feel comfortable increasing the dose of Revlimid further Thankfully, she has no signs of end organ damage She will continue current prescription Revlimid along with Velcade every other week She will continue calcium with vitamin D supplement and Zometa every 3 months She will continue acyclovir for antimicrobial prophylaxis and aspirin for DVT prophylaxis

## 2018-08-17 NOTE — Assessment & Plan Note (Signed)
She was evaluated by physical therapist recently I gave her prescription for walker as requested She will continue physical therapy

## 2018-08-17 NOTE — Progress Notes (Signed)
[place] OFFICE PROGRESS NOTE  Patient Care Team: Susy Frizzle, MD as PCP - General (Family Medicine)  ASSESSMENT & PLAN:  Multiple myeloma in relapse [facility]) Recent myeloma panel showed near complete response to treatment but with mildly elevated light chains We have recently increased the dose of Revlimid. She is frail and weak.  I do not feel comfortable increasing the dose of Revlimid further Thankfully, she has no signs of end organ damage She will continue current prescription Revlimid along with Velcade every other week She will continue calcium with vitamin D supplement and Zometa every 3 months She will continue acyclovir for antimicrobial prophylaxis and aspirin for DVT prophylaxis   Acquired pancytopenia ([place]) She has mild pancytopenia due to treatment She is not symptomatic Observe only We will continue treatment as scheduled We discussed the importance of preventive care and reviewed the vaccination programs. She does not have any prior allergic reactions to influenza vaccination. She agrees to proceed with influenza vaccination today and we will administer it today at the clinic.    Cancer associated pain She has intermittent poorly controlled pain I recommend MS Contin twice a day along with oxycodone as needed for breakthrough pain I refilled her prescriptions today  Bilateral leg weakness She was evaluated by physical therapist recently I gave her prescription for walker as requested She will continue physical therapy   No orders of the defined types were placed in this encounter.   INTERVAL HISTORY: Please see below for problem oriented charting. She returns with assistance for further follow-up She denies recent fall She has been seen by physical therapist recently and requests a walker Her pain is well controlled Denies peripheral neuropathy No recent infection, fever or chills  SUMMARY OF ONCOLOGIC HISTORY:   Multiple myeloma  in relapse ([place])   01/09/2016 Imaging    MRI back showed Interval development of diffuse bone marrow infiltration by innumerable small lesions most consistent with multiple myeloma    01/23/2016 Bone Marrow Biopsy    Accession: [id number] Bone marrow biopsy showed 51% plasma cells    01/23/2016 Imaging    Skeletal survey showed lytic lesions    01/23/2016 Pathology Results    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 Revlimid was stopped in July and resumed in October    04/01/2016 Adverse Reaction    Treatment was placed on hold temporarily due to neutropenia     REVIEW OF SYSTEMS:   Constitutional: Denies fevers, chills or abnormal weight loss Eyes: Denies blurriness of vision Ears, nose, mouth, throat, and face: Denies mucositis or sore throat Respiratory: Denies cough, dyspnea or wheezes Cardiovascular: Denies palpitation, chest discomfort or lower extremity swelling Gastrointestinal:  Denies nausea, heartburn or change in bowel habits Skin: Denies abnormal skin rashes Lymphatics: Denies new lymphadenopathy or easy bruising Neurological:Denies numbness, tingling or new weaknesses Behavioral/Psych: Mood is stable, no new changes  All other systems were reviewed with the patient and are negative.  I have reviewed the past medical history, past surgical history, social history and family history with the patient and they are unchanged from previous note.  ALLERGIES:  is allergic to pravastatin and zocor [simvastatin].  MEDICATIONS:  Current Outpatient Medications  Medication Sig Dispense Refill  . acyclovir (ZOVIRAX) 400 MG tablet Take 1 tablet (400 mg total) by mouth daily. 90 tablet 11  . aspirin EC 81 MG tablet Take 81  mg by mouth daily.    . bortezomib IV (VELCADE) 3.5 MG injection Inject 1.3 mg/m2 into the vein once. Once every other week    . Cholecalciferol (VITAMIN D PO) Take by mouth.     . dexamethasone (DECADRON) 1 MG tablet TAKE 1 TABLET BY MOUTH ONCE DAILY 90 tablet 1  . donepezil (ARICEPT) 10 MG tablet Take 1/2 tablet daily for 2 weeks, then increase to 1 tablet daily 30 tablet 11  . lenalidomide (REVLIMID) 15 MG capsule Take 1 every day by mouth for 21 days, then off 7 days 21 capsule 11  . loperamide (IMODIUM) 2 MG capsule Take 1 capsule (2 mg total) by mouth 4 (four) times daily as needed for diarrhea or loose stools. 12 capsule 0  . morphine (MS CONTIN) 15 MG 12 hr tablet Take 1 tablet (15 mg total) by mouth every 12 (twelve) hours. 60 tablet 0  . oxyCODONE-acetaminophen (ROXICET) 5-325 MG tablet Take 1 tablet by mouth every 6 (six) hours as needed for severe pain. 90 tablet 0   No current facility-administered medications for this visit.     PHYSICAL EXAMINATION: ECOG PERFORMANCE STATUS: 1 - Symptomatic but completely ambulatory  Vitals:   08/17/18 0852  BP: 140/67  Pulse: 63  Resp: 18  Temp: 97.6 F (36.4 C)  SpO2: 98%   Filed Weights   08/17/18 0852  Weight: 143 lb 6.4 oz (65 kg)    GENERAL:alert, no distress and comfortable SKIN: skin color, texture, turgor are normal, no rashes or significant lesions EYES: normal, Conjunctiva are pink and non-injected, sclera clear OROPHARYNX:no exudate, no erythema and lips, buccal mucosa, and tongue normal  NECK: supple, thyroid normal size, non-tender, without nodularity LYMPH:  no palpable lymphadenopathy in the cervical, axillary or inguinal LUNGS: clear to auscultation and percussion with normal breathing effort HEART: regular rate & rhythm and no murmurs and no lower extremity edema ABDOMEN:abdomen soft, non-tender and normal bowel sounds Musculoskeletal:no cyanosis of digits and no clubbing  NEURO: alert & oriented x 3 with fluent speech, no focal motor/sensory deficits  LABORATORY DATA:  I have reviewed the data as listed    Component Value Date/Time   NA 147 (H) 08/17/2018 0817   NA 145 11/03/2017  0930   K 3.6 08/17/2018 0817   K 3.6 11/03/2017 0930   CL 110 08/17/2018 0817   CO2 26 08/17/2018 0817   CO2 27 11/03/2017 0930   GLUCOSE 100 (H) 08/17/2018 0817   GLUCOSE 112 11/03/2017 0930   BUN 11 08/17/2018 0817   BUN 11 12/08/2017 1551   BUN 13.4 11/03/2017 0930   CREATININE 0.78 08/17/2018 0817   CREATININE 0.71 07/06/2018 0912   CREATININE 0.9 11/03/2017 0930   CALCIUM 8.5 (L) 08/17/2018 0817   CALCIUM 9.4 11/03/2017 0930   PROT 5.9 (L) 08/17/2018 0817   PROT 6.2 11/03/2017 0930   PROT 6.9 11/03/2017 0930   ALBUMIN 3.4 (L) 08/17/2018 0817   ALBUMIN 3.2 (L) 11/03/2017 0930   AST 19 08/17/2018 0817   AST 19 07/06/2018 0912   AST 20 11/03/2017 0930   ALT 22 08/17/2018 0817   ALT 17 07/06/2018 0912   ALT 18 11/03/2017 0930   ALKPHOS 55 08/17/2018 0817   ALKPHOS 84 11/03/2017 0930   BILITOT 0.8 08/17/2018 0817   BILITOT 0.7 07/06/2018 0912   BILITOT 0.64 11/03/2017 0930   GFRNONAA >60 08/17/2018 0817   GFRNONAA >60 07/06/2018 0912   GFRNONAA 64 01/17/2016 0910   GFRAA >60 08/17/2018  [place] >60 07/06/2018 0912   GFRAA 74 01/17/2016 0910    No results found for: SPEP, UPEP  Lab Results  Component Value Date   WBC 3.4 (L) 08/17/2018   NEUTROABS 1.4 (L) 08/17/2018   HGB 11.9 08/17/2018   HCT 36.5 08/17/2018   MCV 98.6 08/17/2018   PLT 92 (L) 08/17/2018      Chemistry      Component Value Date/Time   NA 147 (H) 08/17/2018 0817   NA 145 11/03/2017 0930   K 3.6 08/17/2018 0817   K 3.6 11/03/2017 0930   CL 110 08/17/2018 0817   CO2 26 08/17/2018 0817   CO2 27 11/03/2017 0930   BUN 11 08/17/2018 0817   BUN 11 12/08/2017 1551   BUN 13.4 11/03/2017 0930   CREATININE 0.78 08/17/2018 0817   CREATININE 0.71 07/06/2018 0912   CREATININE 0.9 11/03/2017 0930      Component Value Date/Time   CALCIUM 8.5 (L) 08/17/2018 0817   CALCIUM 9.4 11/03/2017 0930   ALKPHOS 55 08/17/2018 0817   ALKPHOS 84 11/03/2017 0930   AST 19 08/17/2018 0817   AST 19  07/06/2018 0912   AST 20 11/03/2017 0930   ALT 22 08/17/2018 0817   ALT 17 07/06/2018 0912   ALT 18 11/03/2017 0930   BILITOT 0.8 08/17/2018 0817   BILITOT 0.7 07/06/2018 0912   BILITOT 0.64 11/03/2017 0930     All questions were answered. The patient knows to call the clinic with any problems, questions or concerns. No barriers to learning was detected.  I spent 15 minutes counseling the patient face to face. The total time spent in the appointment was 20 minutes and more than 50% was on counseling and review of test results  Heath Lark, MD 08/17/2018 2:33 PM

## 2018-08-17 NOTE — Patient Instructions (Signed)
[facility] Discharge Instructions for Patients Receiving Chemotherapy  Today you received the following chemotherapy agent :  Velcade.  To help prevent nausea and vomiting after your treatment, we encourage you to take your nausea medication as prescribed.   If you develop nausea and vomiting that is not controlled by your nausea medication, call the clinic.   BELOW ARE SYMPTOMS THAT SHOULD BE REPORTED IMMEDIATELY:  *FEVER GREATER THAN 100.5 F  *CHILLS WITH OR WITHOUT FEVER  NAUSEA AND VOMITING THAT IS NOT CONTROLLED WITH YOUR NAUSEA MEDICATION  *UNUSUAL SHORTNESS OF BREATH  *UNUSUAL BRUISING OR BLEEDING  TENDERNESS IN MOUTH AND THROAT WITH OR WITHOUT PRESENCE OF ULCERS  *URINARY PROBLEMS  *BOWEL PROBLEMS  UNUSUAL RASH Items with * indicate a potential emergency and should be followed up as soon as possible.  Feel free to call the clinic should you have any questions or concerns. The clinic phone number is [phone].  Please show the CHEMO ALERT CARD at check-in to the Emergency Department and triage nurse.   

## 2018-08-17 NOTE — Assessment & Plan Note (Signed)
She has mild pancytopenia due to treatment She is not symptomatic Observe only We will continue treatment as scheduled We discussed the importance of preventive care and reviewed the vaccination programs. She does not have any prior allergic reactions to influenza vaccination. She agrees to proceed with influenza vaccination today and we will administer it today at the clinic.

## 2018-08-17 NOTE — Assessment & Plan Note (Signed)
She has intermittent poorly controlled pain I recommend MS Contin twice a day along with oxycodone as needed for breakthrough pain I refilled her prescriptions today

## 2018-08-18 DIAGNOSIS — C9002 Multiple myeloma in relapse: Secondary | ICD-10-CM | POA: Diagnosis not present

## 2018-08-19 DIAGNOSIS — C9002 Multiple myeloma in relapse: Secondary | ICD-10-CM | POA: Diagnosis not present

## 2018-08-20 DIAGNOSIS — C9002 Multiple myeloma in relapse: Secondary | ICD-10-CM | POA: Diagnosis not present

## 2018-08-21 DIAGNOSIS — C9002 Multiple myeloma in relapse: Secondary | ICD-10-CM | POA: Diagnosis not present

## 2018-08-22 DIAGNOSIS — C9002 Multiple myeloma in relapse: Secondary | ICD-10-CM | POA: Diagnosis not present

## 2018-08-23 DIAGNOSIS — C9002 Multiple myeloma in relapse: Secondary | ICD-10-CM | POA: Diagnosis not present

## 2018-08-24 DIAGNOSIS — C9002 Multiple myeloma in relapse: Secondary | ICD-10-CM | POA: Diagnosis not present

## 2018-08-25 DIAGNOSIS — C9002 Multiple myeloma in relapse: Secondary | ICD-10-CM | POA: Diagnosis not present

## 2018-08-26 DIAGNOSIS — C9002 Multiple myeloma in relapse: Secondary | ICD-10-CM | POA: Diagnosis not present

## 2018-08-27 DIAGNOSIS — C9002 Multiple myeloma in relapse: Secondary | ICD-10-CM | POA: Diagnosis not present

## 2018-08-28 DIAGNOSIS — C9002 Multiple myeloma in relapse: Secondary | ICD-10-CM | POA: Diagnosis not present

## 2018-08-29 DIAGNOSIS — C9002 Multiple myeloma in relapse: Secondary | ICD-10-CM | POA: Diagnosis not present

## 2018-08-30 DIAGNOSIS — C9002 Multiple myeloma in relapse: Secondary | ICD-10-CM | POA: Diagnosis not present

## 2018-08-31 ENCOUNTER — Inpatient Hospital Stay: Payer: Medicare HMO

## 2018-08-31 VITALS — BP 149/59 | HR 60 | Temp 98.3°F | Resp 18

## 2018-08-31 DIAGNOSIS — C9002 Multiple myeloma in relapse: Secondary | ICD-10-CM

## 2018-08-31 DIAGNOSIS — Z79899 Other long term (current) drug therapy: Secondary | ICD-10-CM | POA: Diagnosis not present

## 2018-08-31 DIAGNOSIS — Z7982 Long term (current) use of aspirin: Secondary | ICD-10-CM | POA: Diagnosis not present

## 2018-08-31 DIAGNOSIS — G893 Neoplasm related pain (acute) (chronic): Secondary | ICD-10-CM | POA: Diagnosis not present

## 2018-08-31 DIAGNOSIS — C9001 Multiple myeloma in remission: Secondary | ICD-10-CM

## 2018-08-31 DIAGNOSIS — E870 Hyperosmolality and hypernatremia: Secondary | ICD-10-CM | POA: Diagnosis not present

## 2018-08-31 DIAGNOSIS — D61818 Other pancytopenia: Secondary | ICD-10-CM | POA: Diagnosis not present

## 2018-08-31 DIAGNOSIS — Z23 Encounter for immunization: Secondary | ICD-10-CM | POA: Diagnosis not present

## 2018-08-31 DIAGNOSIS — Z5111 Encounter for antineoplastic chemotherapy: Secondary | ICD-10-CM | POA: Diagnosis not present

## 2018-08-31 DIAGNOSIS — R64 Cachexia: Secondary | ICD-10-CM | POA: Diagnosis not present

## 2018-08-31 LAB — COMPREHENSIVE METABOLIC PANEL
ALT: 18 U/L (ref 0–44)
ANION GAP: 14 (ref 5–15)
AST: 21 U/L (ref 15–41)
Albumin: 3.6 g/dL (ref 3.5–5.0)
Alkaline Phosphatase: 54 U/L (ref 38–126)
BUN: 8 mg/dL (ref 8–23)
CO2: 26 mmol/L (ref 22–32)
Calcium: 8.9 mg/dL (ref 8.9–10.3)
Chloride: 106 mmol/L (ref 98–111)
Creatinine, Ser: 0.76 mg/dL (ref 0.44–1.00)
GFR calc Af Amer: >60 mL/min (ref >60)
GFR calc non Af Amer: >60 mL/min (ref >60)
Glucose, Bld: 78 mg/dL (ref 70–99)
Potassium: 4 mmol/L (ref 3.5–5.1)
SODIUM: 146 mmol/L — AB (ref 135–145)
TOTAL PROTEIN: 6.1 g/dL — AB (ref 6.5–8.1)
Total Bilirubin: 0.6 mg/dL (ref 0.3–1.2)

## 2018-08-31 LAB — CBC WITH DIFFERENTIAL/PLATELET
Abs Immature Granulocytes: 0.02 10*3/uL (ref 0.00–0.07)
BASOS ABS: 0.1 10*3/uL (ref 0.0–0.1)
Basophils Relative: 2 %
EOS ABS: 0.1 10*3/uL (ref 0.0–0.5)
EOS PCT: 4 %
HCT: 39.7 % (ref 36.0–46.0)
HEMOGLOBIN: 12.8 g/dL (ref 12.0–15.0)
IMMATURE GRANULOCYTES: 1 %
LYMPHS ABS: 1.4 10*3/uL (ref 0.7–4.0)
LYMPHS PCT: 45 %
MCH: 32.3 pg (ref 26.0–34.0)
MCHC: 32.2 g/dL (ref 30.0–36.0)
MCV: 100.3 fL — ABNORMAL HIGH (ref 80.0–100.0)
Monocytes Absolute: 0.2 10*3/uL (ref 0.1–1.0)
Monocytes Relative: 5 %
NEUTROS PCT: 43 %
NRBC: 0 % (ref 0.0–0.2)
Neutro Abs: 1.3 10*3/uL — ABNORMAL LOW (ref 1.7–7.7)
Platelets: 113 10*3/uL — ABNORMAL LOW (ref 150–400)
RBC: 3.96 MIL/uL (ref 3.87–5.11)
RDW: 17.1 % — AB (ref 11.5–15.5)
WBC: 3.1 10*3/uL — AB (ref 4.0–10.5)

## 2018-08-31 MED ORDER — PROCHLORPERAZINE MALEATE 10 MG PO TABS
10.0000 mg | ORAL_TABLET | Freq: Once | ORAL | Status: AC
  Administered 2018-08-31: 10 mg via ORAL

## 2018-08-31 MED ORDER — PROCHLORPERAZINE MALEATE 10 MG PO TABS
ORAL_TABLET | ORAL | Status: AC
  Filled 2018-08-31: qty 1

## 2018-08-31 MED ORDER — BORTEZOMIB CHEMO SQ INJECTION 3.5 MG (2.5MG/ML)
0.9750 mg/m2 | Freq: Once | INTRAMUSCULAR | Status: AC
  Administered 2018-08-31: 1.5 mg via SUBCUTANEOUS
  Filled 2018-08-31: qty 0.6

## 2018-08-31 MED FILL — DONEPEZIL HCL 10 MG TABLET: 10 | 30 days supply | Qty: 30 | Fill #4

## 2018-08-31 NOTE — Patient Instructions (Signed)
[facility] Discharge Instructions for Patients Receiving Chemotherapy  Today you received the following chemotherapy agent :  Velcade.  To help prevent nausea and vomiting after your treatment, we encourage you to take your nausea medication as prescribed.   If you develop nausea and vomiting that is not controlled by your nausea medication, call the clinic.   BELOW ARE SYMPTOMS THAT SHOULD BE REPORTED IMMEDIATELY:  *FEVER GREATER THAN 100.5 F  *CHILLS WITH OR WITHOUT FEVER  NAUSEA AND VOMITING THAT IS NOT CONTROLLED WITH YOUR NAUSEA MEDICATION  *UNUSUAL SHORTNESS OF BREATH  *UNUSUAL BRUISING OR BLEEDING  TENDERNESS IN MOUTH AND THROAT WITH OR WITHOUT PRESENCE OF ULCERS  *URINARY PROBLEMS  *BOWEL PROBLEMS  UNUSUAL RASH Items with * indicate a potential emergency and should be followed up as soon as possible.  Feel free to call the clinic should you have any questions or concerns. The clinic phone number is [phone].  Please show the CHEMO ALERT CARD at check-in to the Emergency Department and triage nurse.   

## 2018-09-01 DIAGNOSIS — C9002 Multiple myeloma in relapse: Secondary | ICD-10-CM | POA: Diagnosis not present

## 2018-09-01 LAB — KAPPA/LAMBDA LIGHT CHAINS
KAPPA, LAMDA LIGHT CHAIN RATIO: 21.36 — AB (ref 0.26–1.65)
Kappa free light chain: 158.1 mg/L — ABNORMAL HIGH (ref 3.3–19.4)
Lambda free light chains: 7.4 mg/L (ref 5.7–26.3)

## 2018-09-02 DIAGNOSIS — C9002 Multiple myeloma in relapse: Secondary | ICD-10-CM | POA: Diagnosis not present

## 2018-09-02 LAB — MULTIPLE MYELOMA PANEL, SERUM
ALPHA2 GLOB SERPL ELPH-MCNC: 0.6 g/dL (ref 0.4–1.0)
Albumin SerPl Elph-Mcnc: 3.4 g/dL (ref 2.9–4.4)
Albumin/Glob SerPl: 1.6 (ref 0.7–1.7)
Alpha 1: 0.2 g/dL (ref 0.0–0.4)
B-GLOBULIN SERPL ELPH-MCNC: 1 g/dL (ref 0.7–1.3)
Gamma Glob SerPl Elph-Mcnc: 0.4 g/dL (ref 0.4–1.8)
Globulin, Total: 2.2 g/dL (ref 2.2–3.9)
IGG (IMMUNOGLOBIN G), SERUM: 475 mg/dL — AB (ref 700–1600)
IGM (IMMUNOGLOBULIN M), SRM: 10 mg/dL — AB (ref 26–217)
IgA: 197 mg/dL (ref 64–422)
TOTAL PROTEIN ELP: 5.6 g/dL — AB (ref 6.0–8.5)

## 2018-09-03 DIAGNOSIS — C9002 Multiple myeloma in relapse: Secondary | ICD-10-CM | POA: Diagnosis not present

## 2018-09-04 DIAGNOSIS — C9002 Multiple myeloma in relapse: Secondary | ICD-10-CM | POA: Diagnosis not present

## 2018-09-05 DIAGNOSIS — C9002 Multiple myeloma in relapse: Secondary | ICD-10-CM | POA: Diagnosis not present

## 2018-09-06 ENCOUNTER — Other Ambulatory Visit: Payer: Self-pay

## 2018-09-06 DIAGNOSIS — C9002 Multiple myeloma in relapse: Secondary | ICD-10-CM | POA: Diagnosis not present

## 2018-09-06 MED ORDER — LENALIDOMIDE 15 MG PO CAPS: ORAL_CAPSULE | ORAL | 11 refills | Status: DC

## 2018-09-07 DIAGNOSIS — C9002 Multiple myeloma in relapse: Secondary | ICD-10-CM | POA: Diagnosis not present

## 2018-09-08 DIAGNOSIS — C9002 Multiple myeloma in relapse: Secondary | ICD-10-CM | POA: Diagnosis not present

## 2018-09-09 DIAGNOSIS — C9002 Multiple myeloma in relapse: Secondary | ICD-10-CM | POA: Diagnosis not present

## 2018-09-10 DIAGNOSIS — C9002 Multiple myeloma in relapse: Secondary | ICD-10-CM | POA: Diagnosis not present

## 2018-09-11 DIAGNOSIS — C9002 Multiple myeloma in relapse: Secondary | ICD-10-CM | POA: Diagnosis not present

## 2018-09-12 DIAGNOSIS — C9002 Multiple myeloma in relapse: Secondary | ICD-10-CM | POA: Diagnosis not present

## 2018-09-13 DIAGNOSIS — C9002 Multiple myeloma in relapse: Secondary | ICD-10-CM | POA: Diagnosis not present

## 2018-09-14 ENCOUNTER — Inpatient Hospital Stay: Payer: Medicare HMO

## 2018-09-14 ENCOUNTER — Inpatient Hospital Stay (HOSPITAL_BASED_OUTPATIENT_CLINIC_OR_DEPARTMENT_OTHER): Payer: Medicare HMO | Admitting: Hematology and Oncology

## 2018-09-14 ENCOUNTER — Telehealth: Payer: Self-pay | Admitting: Hematology and Oncology

## 2018-09-14 ENCOUNTER — Encounter: Payer: Self-pay | Admitting: Hematology and Oncology

## 2018-09-14 DIAGNOSIS — Z5111 Encounter for antineoplastic chemotherapy: Secondary | ICD-10-CM | POA: Diagnosis not present

## 2018-09-14 DIAGNOSIS — E870 Hyperosmolality and hypernatremia: Secondary | ICD-10-CM | POA: Diagnosis not present

## 2018-09-14 DIAGNOSIS — Z79899 Other long term (current) drug therapy: Secondary | ICD-10-CM

## 2018-09-14 DIAGNOSIS — C9001 Multiple myeloma in remission: Secondary | ICD-10-CM

## 2018-09-14 DIAGNOSIS — C9002 Multiple myeloma in relapse: Secondary | ICD-10-CM

## 2018-09-14 DIAGNOSIS — G893 Neoplasm related pain (acute) (chronic): Secondary | ICD-10-CM | POA: Diagnosis not present

## 2018-09-14 DIAGNOSIS — D61818 Other pancytopenia: Secondary | ICD-10-CM

## 2018-09-14 DIAGNOSIS — Z23 Encounter for immunization: Secondary | ICD-10-CM | POA: Diagnosis not present

## 2018-09-14 DIAGNOSIS — Z7982 Long term (current) use of aspirin: Secondary | ICD-10-CM

## 2018-09-14 DIAGNOSIS — R64 Cachexia: Secondary | ICD-10-CM

## 2018-09-14 LAB — COMPREHENSIVE METABOLIC PANEL
ALBUMIN: 3.5 g/dL (ref 3.5–5.0)
ALT: 17 U/L (ref 0–44)
ANION GAP: 11 (ref 5–15)
AST: 15 U/L (ref 15–41)
Alkaline Phosphatase: 55 U/L (ref 38–126)
BILIRUBIN TOTAL: 0.9 mg/dL (ref 0.3–1.2)
BUN: 13 mg/dL (ref 8–23)
CO2: 27 mmol/L (ref 22–32)
Calcium: 8.7 mg/dL — ABNORMAL LOW (ref 8.9–10.3)
Chloride: 109 mmol/L (ref 98–111)
Creatinine, Ser: 0.82 mg/dL (ref 0.44–1.00)
GFR calc Af Amer: >60 mL/min (ref >60)
GLUCOSE: 95 mg/dL (ref 70–99)
Potassium: 3.6 mmol/L (ref 3.5–5.1)
Sodium: 147 mmol/L — ABNORMAL HIGH (ref 135–145)
TOTAL PROTEIN: 6 g/dL — AB (ref 6.5–8.1)

## 2018-09-14 LAB — CBC WITH DIFFERENTIAL/PLATELET
Abs Immature Granulocytes: 0.02 10*3/uL (ref 0.00–0.07)
BASOS ABS: 0 10*3/uL (ref 0.0–0.1)
Basophils Relative: 1 %
EOS PCT: 5 %
Eosinophils Absolute: 0.2 10*3/uL (ref 0.0–0.5)
HEMATOCRIT: 38.3 % (ref 36.0–46.0)
Hemoglobin: 12.3 g/dL (ref 12.0–15.0)
Immature Granulocytes: 1 %
LYMPHS ABS: 1.5 10*3/uL (ref 0.7–4.0)
Lymphocytes Relative: 51 %
MCH: 32.1 pg (ref 26.0–34.0)
MCHC: 32.1 g/dL (ref 30.0–36.0)
MCV: 100 fL (ref 80.0–100.0)
Monocytes Absolute: 0.2 10*3/uL (ref 0.1–1.0)
Monocytes Relative: 6 %
NRBC: 0 % (ref 0.0–0.2)
Neutro Abs: 1.1 10*3/uL — ABNORMAL LOW (ref 1.7–7.7)
Neutrophils Relative %: 36 %
Platelets: 93 10*3/uL — ABNORMAL LOW (ref 150–400)
RBC: 3.83 MIL/uL — AB (ref 3.87–5.11)
RDW: 16.7 % — AB (ref 11.5–15.5)
WBC: 3 10*3/uL — AB (ref 4.0–10.5)

## 2018-09-14 MED ORDER — PROCHLORPERAZINE MALEATE 10 MG PO TABS
10.0000 mg | ORAL_TABLET | Freq: Once | ORAL | Status: AC
  Administered 2018-09-14: 10 mg via ORAL

## 2018-09-14 MED ORDER — PROCHLORPERAZINE MALEATE 10 MG PO TABS
ORAL_TABLET | ORAL | Status: AC
  Filled 2018-09-14: qty 1

## 2018-09-14 MED ORDER — BORTEZOMIB CHEMO SQ INJECTION 3.5 MG (2.5MG/ML)
0.9750 mg/m2 | Freq: Once | INTRAMUSCULAR | Status: AC
  Administered 2018-09-14: 1.5 mg via SUBCUTANEOUS
  Filled 2018-09-14: qty 0.6

## 2018-09-14 MED ORDER — MORPHINE SULFATE ER 15 MG PO TBCR: 15.0000 mg | EXTENDED_RELEASE_TABLET | Freq: Two times a day (BID) | ORAL | 0 refills | Status: DC

## 2018-09-14 MED FILL — LISINOPRIL 40 MG TABLET: 40 | 30 days supply | Qty: 30 | Fill #6

## 2018-09-14 MED FILL — OXYCODONE-ACETAMINOPHEN 5-3: 5-325 | 23 days supply | Qty: 90 | Fill #0

## 2018-09-14 NOTE — Progress Notes (Signed)
[place] OFFICE PROGRESS NOTE  Patient Care Team: Susy Frizzle, MD as PCP - General (Family Medicine)  ASSESSMENT & PLAN:  Multiple myeloma in relapse [facility]) Recent myeloma panel showed near complete response to treatment but with mildly elevated light chains We have recently increased the dose of Revlimid and she tolerated that well Thankfully, she has no signs of end organ damage She will continue current prescription Revlimid along with Velcade every other week She will continue calcium with vitamin D supplement and Zometa every 3 months She will continue acyclovir for antimicrobial prophylaxis and aspirin for DVT prophylaxis   Acquired pancytopenia ([place]) She has mild pancytopenia due to treatment She is not symptomatic Observe only We will continue treatment as scheduled  Cancer associated pain She has chronic back pain from her disease I recommend MS Contin twice a day along with oxycodone as needed for breakthrough pain I refilled her prescriptions today  Malignant cachexia ([place]) She has poor oral intake and has lost some weight recently She is also found to have mild hypernatremia secondary to poor oral fluid intake I encouraged her to drink more fluids as tolerated   No orders of the defined types were placed in this encounter.   INTERVAL HISTORY: Please see below for problem oriented charting. She returns with her sisters for further follow-up She is doing well No recent infection, fever or chills Her chronic back pain is stable No recent falls She is noted to have lost some weight and is frequently noted to have mild hypernatremia  SUMMARY OF ONCOLOGIC HISTORY:   Multiple myeloma in relapse ([place])   01/09/2016 Imaging    MRI back showed Interval development of diffuse bone marrow infiltration by innumerable small lesions most consistent with multiple myeloma    01/23/2016 Bone Marrow Biopsy    Accession: [id number] Bone marrow biopsy  showed 51% plasma cells    01/23/2016 Imaging    Skeletal survey showed lytic lesions    01/23/2016 Pathology Results    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 Revlimid was stopped in July and resumed in October    04/01/2016 Adverse Reaction    Treatment was placed on hold temporarily due to neutropenia     REVIEW OF SYSTEMS:   Constitutional: Denies fevers, chills or abnormal weight loss Eyes: Denies blurriness of vision Ears, nose, mouth, throat, and face: Denies mucositis or sore throat Respiratory: Denies cough, dyspnea or wheezes Cardiovascular: Denies palpitation, chest discomfort or lower extremity swelling Gastrointestinal:  Denies nausea, heartburn or change in bowel habits Skin: Denies abnormal skin rashes Lymphatics: Denies new lymphadenopathy or easy bruising Neurological:Denies numbness, tingling or new weaknesses Behavioral/Psych: Mood is stable, no new changes  All other systems were reviewed with the patient and are negative.  I have reviewed the past medical history, past surgical history, social history and family history with the patient and they are unchanged from previous note.  ALLERGIES:  is allergic to pravastatin and zocor [simvastatin].  MEDICATIONS:  Current Outpatient Medications  Medication Sig Dispense Refill  . acyclovir (ZOVIRAX) 400 MG tablet Take 1 tablet (400 mg total) by mouth daily. 90 tablet 11  . aspirin EC 81 MG tablet Take 81 mg by mouth daily.    . bortezomib IV (VELCADE) 3.5 MG injection Inject 1.3 mg/m2 into the vein once. Once every other week    . Cholecalciferol (VITAMIN D  PO) Take by mouth.    . dexamethasone (DECADRON) 1 MG tablet TAKE 1 TABLET BY MOUTH ONCE DAILY 90 tablet 1  . donepezil (ARICEPT) 10 MG tablet Take 1/2 tablet daily for 2 weeks, then increase to 1 tablet daily 30 tablet 11  . lenalidomide (REVLIMID) 15 MG capsule Take 1  every day by mouth for 21 days, then off 7 days 21 capsule 11  . loperamide (IMODIUM) 2 MG capsule Take 1 capsule (2 mg total) by mouth 4 (four) times daily as needed for diarrhea or loose stools. 12 capsule 0  . morphine (MS CONTIN) 15 MG 12 hr tablet Take 1 tablet (15 mg total) by mouth every 12 (twelve) hours. 60 tablet 0  . oxyCODONE-acetaminophen (ROXICET) 5-325 MG tablet Take 1 tablet by mouth every 6 (six) hours as needed for severe pain. 90 tablet 0   No current facility-administered medications for this visit.     PHYSICAL EXAMINATION: ECOG PERFORMANCE STATUS: 1 - Symptomatic but completely ambulatory  Vitals:   09/14/18 0817  BP: 116/70  Pulse: 70  Resp: 17  Temp: 97.6 F (36.4 C)  SpO2: 99%   Filed Weights   09/14/18 0817  Weight: 140 lb 3.2 oz (63.6 kg)    GENERAL:alert, no distress and comfortable SKIN: skin color, texture, turgor are normal, no rashes or significant lesions EYES: normal, Conjunctiva are pink and non-injected, sclera clear OROPHARYNX:no exudate, no erythema and lips, buccal mucosa, and tongue normal  NECK: supple, thyroid normal size, non-tender, without nodularity LYMPH:  no palpable lymphadenopathy in the cervical, axillary or inguinal LUNGS: clear to auscultation and percussion with normal breathing effort HEART: regular rate & rhythm and no murmurs and no lower extremity edema ABDOMEN:abdomen soft, non-tender and normal bowel sounds Musculoskeletal:no cyanosis of digits and no clubbing  NEURO: alert & oriented x 3 with fluent speech, no focal motor/sensory deficits  LABORATORY DATA:  I have reviewed the data as listed    Component Value Date/Time   NA 146 (H) 08/31/2018 0802   NA 145 11/03/2017 0930   K 4.0 08/31/2018 0802   K 3.6 11/03/2017 0930   CL 106 08/31/2018 0802   CO2 26 08/31/2018 0802   CO2 27 11/03/2017 0930   GLUCOSE 78 08/31/2018 0802   GLUCOSE 112 11/03/2017 0930   BUN 8 08/31/2018 0802   BUN 11 12/08/2017 1551   BUN  13.4 11/03/2017 0930   CREATININE 0.76 08/31/2018 0802   CREATININE 0.71 07/06/2018 0912   CREATININE 0.9 11/03/2017 0930   CALCIUM 8.9 08/31/2018 0802   CALCIUM 9.4 11/03/2017 0930   PROT 6.1 (L) 08/31/2018 0802   PROT 6.2 11/03/2017 0930   PROT 6.9 11/03/2017 0930   ALBUMIN 3.6 08/31/2018 0802   ALBUMIN 3.2 (L) 11/03/2017 0930   AST 21 08/31/2018 0802   AST 19 07/06/2018 0912   AST 20 11/03/2017 0930   ALT 18 08/31/2018 0802   ALT 17 07/06/2018 0912   ALT 18 11/03/2017 0930   ALKPHOS 54 08/31/2018 0802   ALKPHOS 84 11/03/2017 0930   BILITOT 0.6 08/31/2018 0802   BILITOT 0.7 07/06/2018 0912   BILITOT 0.64 11/03/2017 0930   GFRNONAA >60 08/31/2018 0802   GFRNONAA >60 07/06/2018 0912   GFRNONAA 64 01/17/2016 0910   GFRAA >60 08/31/2018 0802   GFRAA >60 07/06/2018 0912   GFRAA 74 01/17/2016 0910    No results found for: SPEP, UPEP  Lab Results  Component Value Date   WBC 3.0 (L) 09/14/2018  NEUTROABS 1.1 (L) 09/14/2018   HGB 12.3 09/14/2018   HCT 38.3 09/14/2018   MCV 100.0 09/14/2018   PLT 93 (L) 09/14/2018      Chemistry      Component Value Date/Time   NA 146 (H) 08/31/2018 0802   NA 145 11/03/2017 0930   K 4.0 08/31/2018 0802   K 3.6 11/03/2017 0930   CL 106 08/31/2018 0802   CO2 26 08/31/2018 0802   CO2 27 11/03/2017 0930   BUN 8 08/31/2018 0802   BUN 11 12/08/2017 1551   BUN 13.4 11/03/2017 0930   CREATININE 0.76 08/31/2018 0802   CREATININE 0.71 07/06/2018 0912   CREATININE 0.9 11/03/2017 0930      Component Value Date/Time   CALCIUM 8.9 08/31/2018 0802   CALCIUM 9.4 11/03/2017 0930   ALKPHOS 54 08/31/2018 0802   ALKPHOS 84 11/03/2017 0930   AST 21 08/31/2018 0802   AST 19 07/06/2018 0912   AST 20 11/03/2017 0930   ALT 18 08/31/2018 0802   ALT 17 07/06/2018 0912   ALT 18 11/03/2017 0930   BILITOT 0.6 08/31/2018 0802   BILITOT 0.7 07/06/2018 0912   BILITOT 0.64 11/03/2017 0930      All questions were answered. The patient knows to call  the clinic with any problems, questions or concerns. No barriers to learning was detected.  I spent 15 minutes counseling the patient face to face. The total time spent in the appointment was 20 minutes and more than 50% was on counseling and review of test results  Heath Lark, MD 09/14/2018 8:25 AM

## 2018-09-14 NOTE — Telephone Encounter (Signed)
Gave avs  ° °

## 2018-09-14 NOTE — Patient Instructions (Signed)
[facility] Discharge Instructions for Patients Receiving Chemotherapy  Today you received the following chemotherapy agents Velcade.  To help prevent nausea and vomiting after your treatment, we encourage you to take your nausea medication as directed.  If you develop nausea and vomiting that is not controlled by your nausea medication, call the clinic.   BELOW ARE SYMPTOMS THAT SHOULD BE REPORTED IMMEDIATELY:  *FEVER GREATER THAN 100.5 F  *CHILLS WITH OR WITHOUT FEVER  NAUSEA AND VOMITING THAT IS NOT CONTROLLED WITH YOUR NAUSEA MEDICATION  *UNUSUAL SHORTNESS OF BREATH  *UNUSUAL BRUISING OR BLEEDING  TENDERNESS IN MOUTH AND THROAT WITH OR WITHOUT PRESENCE OF ULCERS  *URINARY PROBLEMS  *BOWEL PROBLEMS  UNUSUAL RASH Items with * indicate a potential emergency and should be followed up as soon as possible.  Feel free to call the clinic should you have any questions or concerns. The clinic phone number is [phone].  Please show the CHEMO ALERT CARD at check-in to the Emergency Department and triage nurse.   

## 2018-09-14 NOTE — Assessment & Plan Note (Signed)
Recent myeloma panel showed near complete response to treatment but with mildly elevated light chains We have recently increased the dose of Revlimid and she tolerated that well Thankfully, she has no signs of end organ damage She will continue current prescription Revlimid along with Velcade every other week She will continue calcium with vitamin D supplement and Zometa every 3 months She will continue acyclovir for antimicrobial prophylaxis and aspirin for DVT prophylaxis  

## 2018-09-14 NOTE — Assessment & Plan Note (Signed)
She has mild pancytopenia due to treatment She is not symptomatic Observe only We will continue treatment as scheduled 

## 2018-09-14 NOTE — Assessment & Plan Note (Signed)
She has chronic back pain from her disease I recommend MS Contin twice a day along with oxycodone as needed for breakthrough pain I refilled her prescriptions today

## 2018-09-14 NOTE — Assessment & Plan Note (Signed)
She has poor oral intake and has lost some weight recently She is also found to have mild hypernatremia secondary to poor oral fluid intake I encouraged her to drink more fluids as tolerated 

## 2018-09-15 DIAGNOSIS — C9002 Multiple myeloma in relapse: Secondary | ICD-10-CM | POA: Diagnosis not present

## 2018-09-16 DIAGNOSIS — C9002 Multiple myeloma in relapse: Secondary | ICD-10-CM | POA: Diagnosis not present

## 2018-09-17 DIAGNOSIS — C9002 Multiple myeloma in relapse: Secondary | ICD-10-CM | POA: Diagnosis not present

## 2018-09-18 DIAGNOSIS — C9002 Multiple myeloma in relapse: Secondary | ICD-10-CM | POA: Diagnosis not present

## 2018-09-19 DIAGNOSIS — C9002 Multiple myeloma in relapse: Secondary | ICD-10-CM | POA: Diagnosis not present

## 2018-09-20 DIAGNOSIS — C9002 Multiple myeloma in relapse: Secondary | ICD-10-CM | POA: Diagnosis not present

## 2018-09-21 DIAGNOSIS — C9002 Multiple myeloma in relapse: Secondary | ICD-10-CM | POA: Diagnosis not present

## 2018-09-22 DIAGNOSIS — C9002 Multiple myeloma in relapse: Secondary | ICD-10-CM | POA: Diagnosis not present

## 2018-09-23 DIAGNOSIS — C9002 Multiple myeloma in relapse: Secondary | ICD-10-CM | POA: Diagnosis not present

## 2018-09-24 DIAGNOSIS — C9002 Multiple myeloma in relapse: Secondary | ICD-10-CM | POA: Diagnosis not present

## 2018-09-25 DIAGNOSIS — C9002 Multiple myeloma in relapse: Secondary | ICD-10-CM | POA: Diagnosis not present

## 2018-09-26 DIAGNOSIS — C9002 Multiple myeloma in relapse: Secondary | ICD-10-CM | POA: Diagnosis not present

## 2018-09-27 DIAGNOSIS — C9002 Multiple myeloma in relapse: Secondary | ICD-10-CM | POA: Diagnosis not present

## 2018-09-27 MED FILL — DONEPEZIL HCL 10 MG TABLET: 10 | 30 days supply | Qty: 30 | Fill #5

## 2018-09-28 ENCOUNTER — Inpatient Hospital Stay: Payer: Medicare HMO | Attending: Hematology and Oncology

## 2018-09-28 ENCOUNTER — Telehealth: Payer: Self-pay | Admitting: *Deleted

## 2018-09-28 ENCOUNTER — Inpatient Hospital Stay: Payer: Medicare HMO

## 2018-09-28 VITALS — BP 135/73 | HR 62 | Temp 98.4°F | Resp 17 | Wt 141.2 lb

## 2018-09-28 DIAGNOSIS — C9001 Multiple myeloma in remission: Secondary | ICD-10-CM

## 2018-09-28 DIAGNOSIS — Z5111 Encounter for antineoplastic chemotherapy: Secondary | ICD-10-CM | POA: Diagnosis not present

## 2018-09-28 DIAGNOSIS — C9002 Multiple myeloma in relapse: Secondary | ICD-10-CM

## 2018-09-28 DIAGNOSIS — C9000 Multiple myeloma not having achieved remission: Secondary | ICD-10-CM | POA: Diagnosis not present

## 2018-09-28 DIAGNOSIS — Z79899 Other long term (current) drug therapy: Secondary | ICD-10-CM | POA: Insufficient documentation

## 2018-09-28 LAB — CBC WITH DIFFERENTIAL/PLATELET
Abs Immature Granulocytes: 0.05 10*3/uL (ref 0.00–0.07)
BASOS ABS: 0.1 10*3/uL (ref 0.0–0.1)
Basophils Relative: 2 %
EOS PCT: 2 %
Eosinophils Absolute: 0.1 10*3/uL (ref 0.0–0.5)
HEMATOCRIT: 37.5 % (ref 36.0–46.0)
Hemoglobin: 12.2 g/dL (ref 12.0–15.0)
Immature Granulocytes: 2 %
LYMPHS ABS: 1.4 10*3/uL (ref 0.7–4.0)
Lymphocytes Relative: 46 %
MCH: 32.7 pg (ref 26.0–34.0)
MCHC: 32.5 g/dL (ref 30.0–36.0)
MCV: 100.5 fL — ABNORMAL HIGH (ref 80.0–100.0)
Monocytes Absolute: 0.2 10*3/uL (ref 0.1–1.0)
Monocytes Relative: 6 %
NRBC: 0 % (ref 0.0–0.2)
Neutro Abs: 1.3 10*3/uL — ABNORMAL LOW (ref 1.7–7.7)
Neutrophils Relative %: 42 %
Platelets: 142 10*3/uL — ABNORMAL LOW (ref 150–400)
RBC: 3.73 MIL/uL — AB (ref 3.87–5.11)
RDW: 17 % — AB (ref 11.5–15.5)
WBC: 3 10*3/uL — AB (ref 4.0–10.5)

## 2018-09-28 LAB — COMPREHENSIVE METABOLIC PANEL
ALBUMIN: 3.5 g/dL (ref 3.5–5.0)
ALT: 19 U/L (ref 0–44)
AST: 20 U/L (ref 15–41)
Alkaline Phosphatase: 56 U/L (ref 38–126)
Anion gap: 9 (ref 5–15)
BILIRUBIN TOTAL: 0.7 mg/dL (ref 0.3–1.2)
BUN: 10 mg/dL (ref 8–23)
CO2: 29 mmol/L (ref 22–32)
Calcium: 9.1 mg/dL (ref 8.9–10.3)
Chloride: 108 mmol/L (ref 98–111)
Creatinine, Ser: 0.75 mg/dL (ref 0.44–1.00)
GFR calc Af Amer: >60 mL/min (ref >60)
GFR calc non Af Amer: >60 mL/min (ref >60)
GLUCOSE: 91 mg/dL (ref 70–99)
POTASSIUM: 3.7 mmol/L (ref 3.5–5.1)
Sodium: 146 mmol/L — ABNORMAL HIGH (ref 135–145)
TOTAL PROTEIN: 6 g/dL — AB (ref 6.5–8.1)

## 2018-09-28 MED ORDER — BORTEZOMIB CHEMO SQ INJECTION 3.5 MG (2.5MG/ML)
0.9750 mg/m2 | Freq: Once | INTRAMUSCULAR | Status: AC
  Administered 2018-09-28: 1.5 mg via SUBCUTANEOUS
  Filled 2018-09-28: qty 0.6

## 2018-09-28 MED ORDER — PROCHLORPERAZINE MALEATE 10 MG PO TABS
ORAL_TABLET | ORAL | Status: AC
  Filled 2018-09-28: qty 1

## 2018-09-28 MED ORDER — PROCHLORPERAZINE MALEATE 10 MG PO TABS
10.0000 mg | ORAL_TABLET | Freq: Once | ORAL | Status: AC
  Administered 2018-09-28: 10 mg via ORAL

## 2018-09-28 NOTE — Patient Instructions (Signed)
[facility] Discharge Instructions for Patients Receiving Chemotherapy  Today you received the following chemotherapy agent :  Velcade.  To help prevent nausea and vomiting after your treatment, we encourage you to take your nausea medication as prescribed.   If you develop nausea and vomiting that is not controlled by your nausea medication, call the clinic.   BELOW ARE SYMPTOMS THAT SHOULD BE REPORTED IMMEDIATELY:  *FEVER GREATER THAN 100.5 F  *CHILLS WITH OR WITHOUT FEVER  NAUSEA AND VOMITING THAT IS NOT CONTROLLED WITH YOUR NAUSEA MEDICATION  *UNUSUAL SHORTNESS OF BREATH  *UNUSUAL BRUISING OR BLEEDING  TENDERNESS IN MOUTH AND THROAT WITH OR WITHOUT PRESENCE OF ULCERS  *URINARY PROBLEMS  *BOWEL PROBLEMS  UNUSUAL RASH Items with * indicate a potential emergency and should be followed up as soon as possible.  Feel free to call the clinic should you have any questions or concerns. The clinic phone number is [phone].  Please show the CHEMO ALERT CARD at check-in to the Emergency Department and triage nurse.   

## 2018-09-28 NOTE — Telephone Encounter (Signed)
Per MD review per call from treatment room nurse with [place] of 1.3 for velcade - ok to treat. Called to treatment room RN.

## 2018-09-29 DIAGNOSIS — C9002 Multiple myeloma in relapse: Secondary | ICD-10-CM | POA: Diagnosis not present

## 2018-09-29 LAB — KAPPA/LAMBDA LIGHT CHAINS
KAPPA FREE LGHT CHN: 174.9 mg/L — AB (ref 3.3–19.4)
KAPPA, LAMDA LIGHT CHAIN RATIO: 26.5 — AB (ref 0.26–1.65)
LAMDA FREE LIGHT CHAINS: 6.6 mg/L (ref 5.7–26.3)

## 2018-09-30 DIAGNOSIS — C9002 Multiple myeloma in relapse: Secondary | ICD-10-CM | POA: Diagnosis not present

## 2018-10-01 DIAGNOSIS — C9002 Multiple myeloma in relapse: Secondary | ICD-10-CM | POA: Diagnosis not present

## 2018-10-01 LAB — MULTIPLE MYELOMA PANEL, SERUM
ALBUMIN/GLOB SERPL: 1.4 (ref 0.7–1.7)
ALPHA 1: 0.2 g/dL (ref 0.0–0.4)
Albumin SerPl Elph-Mcnc: 3.2 g/dL (ref 2.9–4.4)
Alpha2 Glob SerPl Elph-Mcnc: 0.7 g/dL (ref 0.4–1.0)
B-Globulin SerPl Elph-Mcnc: 1 g/dL (ref 0.7–1.3)
Gamma Glob SerPl Elph-Mcnc: 0.4 g/dL (ref 0.4–1.8)
Globulin, Total: 2.3 g/dL (ref 2.2–3.9)
IgA: 192 mg/dL (ref 64–422)
IgG (Immunoglobin G), Serum: 441 mg/dL — ABNORMAL LOW (ref 700–1600)
IgM (Immunoglobulin M), Srm: 9 mg/dL — ABNORMAL LOW (ref 26–217)
M Protein SerPl Elph-Mcnc: 0.2 g/dL — ABNORMAL HIGH
Total Protein ELP: 5.5 g/dL — ABNORMAL LOW (ref 6.0–8.5)

## 2018-10-02 DIAGNOSIS — C9002 Multiple myeloma in relapse: Secondary | ICD-10-CM | POA: Diagnosis not present

## 2018-10-03 DIAGNOSIS — C9002 Multiple myeloma in relapse: Secondary | ICD-10-CM | POA: Diagnosis not present

## 2018-10-04 DIAGNOSIS — C9002 Multiple myeloma in relapse: Secondary | ICD-10-CM | POA: Diagnosis not present

## 2018-10-05 DIAGNOSIS — C9002 Multiple myeloma in relapse: Secondary | ICD-10-CM | POA: Diagnosis not present

## 2018-10-06 DIAGNOSIS — C9002 Multiple myeloma in relapse: Secondary | ICD-10-CM | POA: Diagnosis not present

## 2018-10-07 ENCOUNTER — Telehealth: Payer: Self-pay | Admitting: *Deleted

## 2018-10-07 DIAGNOSIS — C9002 Multiple myeloma in relapse: Secondary | ICD-10-CM | POA: Diagnosis not present

## 2018-10-07 NOTE — Telephone Encounter (Addendum)
"  Kimberly Friedman's sister [name] [phone]) called reporting   "[name] has used too much medication today.  We use a pill box.    At 9:30 am I gave Acyclovir 400 mg, Dexamethasone 1 mg, Donepezil 10 mg, Lenalidomide 15 mg and MS Contin 15 mg at 9:30 am.    Aid arrived at 10:00 this morning; missed today's care note; called to let me know she has gave medications but these were for Friday morning.  My sister didn't tell her as she has a little dementia.    What do we need to do?"  Provided [place] Poison Control number [phone]. Oral Chemotherapy Pharmacist instructed NOT to take tomorrow's Revlimid and she may experience diarrhea. No life threatening problems with dexamethasone.  With increase of blood concentrations of Acyclovir  and MS Contin cause dangerous side effects.  Morphine affects lungs, breathing with high levels.  High chance Poison Control will advise going to ED.   No further questions or needs at this time.  Teressa Senter will "call Poison control, return home, Denying need to call 911.  I will be able to drive."

## 2018-10-08 DIAGNOSIS — C9002 Multiple myeloma in relapse: Secondary | ICD-10-CM | POA: Diagnosis not present

## 2018-10-09 DIAGNOSIS — C9002 Multiple myeloma in relapse: Secondary | ICD-10-CM | POA: Diagnosis not present

## 2018-10-10 DIAGNOSIS — C9002 Multiple myeloma in relapse: Secondary | ICD-10-CM | POA: Diagnosis not present

## 2018-10-11 DIAGNOSIS — C9002 Multiple myeloma in relapse: Secondary | ICD-10-CM | POA: Diagnosis not present

## 2018-10-12 ENCOUNTER — Other Ambulatory Visit: Payer: Self-pay | Admitting: *Deleted

## 2018-10-12 ENCOUNTER — Inpatient Hospital Stay: Payer: Medicare HMO

## 2018-10-12 VITALS — BP 134/85 | HR 53 | Temp 97.9°F | Resp 16

## 2018-10-12 DIAGNOSIS — C9002 Multiple myeloma in relapse: Secondary | ICD-10-CM

## 2018-10-12 DIAGNOSIS — Z79899 Other long term (current) drug therapy: Secondary | ICD-10-CM | POA: Diagnosis not present

## 2018-10-12 DIAGNOSIS — C9000 Multiple myeloma not having achieved remission: Secondary | ICD-10-CM | POA: Diagnosis not present

## 2018-10-12 DIAGNOSIS — C9001 Multiple myeloma in remission: Secondary | ICD-10-CM

## 2018-10-12 DIAGNOSIS — Z5111 Encounter for antineoplastic chemotherapy: Secondary | ICD-10-CM | POA: Diagnosis not present

## 2018-10-12 LAB — CBC WITH DIFFERENTIAL/PLATELET
ABS IMMATURE GRANULOCYTES: 0.02 10*3/uL (ref 0.00–0.07)
BASOS ABS: 0 10*3/uL (ref 0.0–0.1)
BASOS PCT: 1 %
Eosinophils Absolute: 0.2 10*3/uL (ref 0.0–0.5)
Eosinophils Relative: 5 %
HCT: 37.7 % (ref 36.0–46.0)
HEMOGLOBIN: 12.4 g/dL (ref 12.0–15.0)
Immature Granulocytes: 1 %
LYMPHS PCT: 38 %
Lymphs Abs: 1.4 10*3/uL (ref 0.7–4.0)
MCH: 32.8 pg (ref 26.0–34.0)
MCHC: 32.9 g/dL (ref 30.0–36.0)
MCV: 99.7 fL (ref 80.0–100.0)
MONO ABS: 0.3 10*3/uL (ref 0.1–1.0)
Monocytes Relative: 9 %
NEUTROS ABS: 1.7 10*3/uL (ref 1.7–7.7)
NRBC: 0 % (ref 0.0–0.2)
Neutrophils Relative %: 46 %
PLATELETS: 115 10*3/uL — AB (ref 150–400)
RBC: 3.78 MIL/uL — AB (ref 3.87–5.11)
RDW: 16.2 % — ABNORMAL HIGH (ref 11.5–15.5)
WBC: 3.7 10*3/uL — AB (ref 4.0–10.5)

## 2018-10-12 LAB — COMPREHENSIVE METABOLIC PANEL
ALBUMIN: 3.6 g/dL (ref 3.5–5.0)
ALK PHOS: 67 U/L (ref 38–126)
ALT: 21 U/L (ref 0–44)
ANION GAP: 11 (ref 5–15)
AST: 25 U/L (ref 15–41)
BUN: 12 mg/dL (ref 8–23)
CHLORIDE: 111 mmol/L (ref 98–111)
CO2: 26 mmol/L (ref 22–32)
Calcium: 9.1 mg/dL (ref 8.9–10.3)
Creatinine, Ser: 0.8 mg/dL (ref 0.44–1.00)
GFR calc non Af Amer: >60 mL/min (ref >60)
Glucose, Bld: 81 mg/dL (ref 70–99)
Potassium: 3.8 mmol/L (ref 3.5–5.1)
SODIUM: 148 mmol/L — AB (ref 135–145)
Total Bilirubin: 0.6 mg/dL (ref 0.3–1.2)
Total Protein: 6.2 g/dL — ABNORMAL LOW (ref 6.5–8.1)

## 2018-10-12 MED ORDER — ZOLEDRONIC ACID 4 MG/5ML IV CONC
3.5000 mg | Freq: Once | INTRAVENOUS | Status: AC
  Administered 2018-10-12: 3.5 mg via INTRAVENOUS
  Filled 2018-10-12: qty 4.38

## 2018-10-12 MED ORDER — PROCHLORPERAZINE MALEATE 10 MG PO TABS
10.0000 mg | ORAL_TABLET | Freq: Once | ORAL | Status: AC
  Administered 2018-10-12: 10 mg via ORAL

## 2018-10-12 MED ORDER — PROCHLORPERAZINE MALEATE 10 MG PO TABS
ORAL_TABLET | ORAL | Status: AC
  Filled 2018-10-12: qty 1

## 2018-10-12 MED ORDER — BORTEZOMIB CHEMO SQ INJECTION 3.5 MG (2.5MG/ML)
0.9750 mg/m2 | Freq: Once | INTRAMUSCULAR | Status: AC
  Administered 2018-10-12: 1.5 mg via SUBCUTANEOUS
  Filled 2018-10-12: qty 0.6

## 2018-10-12 MED FILL — MORPHINE SULF ER 15 MG TAB: 15 | 30 days supply | Qty: 60 | Fill #0

## 2018-10-12 NOTE — Patient Instructions (Signed)
[facility] Discharge Instructions for Patients Receiving Chemotherapy  Today you received the following chemotherapy agents Velcade, Zometa.  To help prevent nausea and vomiting after your treatment, we encourage you to take your nausea medication as directed   If you develop nausea and vomiting that is not controlled by your nausea medication, call the clinic.   BELOW ARE SYMPTOMS THAT SHOULD BE REPORTED IMMEDIATELY:  *FEVER GREATER THAN 100.5 F  *CHILLS WITH OR WITHOUT FEVER  NAUSEA AND VOMITING THAT IS NOT CONTROLLED WITH YOUR NAUSEA MEDICATION  *UNUSUAL SHORTNESS OF BREATH  *UNUSUAL BRUISING OR BLEEDING  TENDERNESS IN MOUTH AND THROAT WITH OR WITHOUT PRESENCE OF ULCERS  *URINARY PROBLEMS  *BOWEL PROBLEMS  UNUSUAL RASH Items with * indicate a potential emergency and should be followed up as soon as possible.  Feel free to call the clinic should you have any questions or concerns. The clinic phone number is [phone].  Please show the [place] at check-in to the Emergency Department and triage nurse.

## 2018-10-13 DIAGNOSIS — C9002 Multiple myeloma in relapse: Secondary | ICD-10-CM | POA: Diagnosis not present

## 2018-10-13 LAB — KAPPA/LAMBDA LIGHT CHAINS
Kappa free light chain: 134.2 mg/L — ABNORMAL HIGH (ref 3.3–19.4)
Kappa, lambda light chain ratio: 15.43 — ABNORMAL HIGH (ref 0.26–1.65)
Lambda free light chains: 8.7 mg/L (ref 5.7–26.3)

## 2018-10-14 DIAGNOSIS — C9002 Multiple myeloma in relapse: Secondary | ICD-10-CM | POA: Diagnosis not present

## 2018-10-15 DIAGNOSIS — C9002 Multiple myeloma in relapse: Secondary | ICD-10-CM | POA: Diagnosis not present

## 2018-10-16 DIAGNOSIS — C9002 Multiple myeloma in relapse: Secondary | ICD-10-CM | POA: Diagnosis not present

## 2018-10-17 DIAGNOSIS — C9002 Multiple myeloma in relapse: Secondary | ICD-10-CM | POA: Diagnosis not present

## 2018-10-18 ENCOUNTER — Other Ambulatory Visit: Payer: Self-pay | Admitting: Hematology and Oncology

## 2018-10-18 DIAGNOSIS — C9002 Multiple myeloma in relapse: Secondary | ICD-10-CM

## 2018-10-18 LAB — MULTIPLE MYELOMA PANEL, SERUM
ALPHA2 GLOB SERPL ELPH-MCNC: 0.7 g/dL (ref 0.4–1.0)
Albumin SerPl Elph-Mcnc: 3.6 g/dL (ref 2.9–4.4)
Albumin/Glob SerPl: 1.6 (ref 0.7–1.7)
Alpha 1: 0.2 g/dL (ref 0.0–0.4)
B-Globulin SerPl Elph-Mcnc: 1.1 g/dL (ref 0.7–1.3)
Gamma Glob SerPl Elph-Mcnc: 0.3 g/dL — ABNORMAL LOW (ref 0.4–1.8)
Globulin, Total: 2.3 g/dL (ref 2.2–3.9)
IGG (IMMUNOGLOBIN G), SERUM: 451 mg/dL — AB (ref 700–1600)
IgA: 213 mg/dL (ref 64–422)
IgM (Immunoglobulin M), Srm: 7 mg/dL — ABNORMAL LOW (ref 26–217)
M Protein SerPl Elph-Mcnc: 0.2 g/dL — ABNORMAL HIGH
TOTAL PROTEIN ELP: 5.9 g/dL — AB (ref 6.0–8.5)

## 2018-10-18 MED FILL — ACYCLOVIR 400 MG TABLET: 400 | 90 days supply | Qty: 90 | Fill #3

## 2018-10-22 ENCOUNTER — Other Ambulatory Visit: Payer: Self-pay

## 2018-10-22 MED ORDER — LENALIDOMIDE 15 MG PO CAPS: ORAL_CAPSULE | ORAL | 11 refills | Status: DC

## 2018-10-25 DIAGNOSIS — C9002 Multiple myeloma in relapse: Secondary | ICD-10-CM | POA: Diagnosis not present

## 2018-10-26 ENCOUNTER — Inpatient Hospital Stay: Payer: Medicare HMO | Attending: Hematology and Oncology

## 2018-10-26 ENCOUNTER — Encounter: Payer: Self-pay | Admitting: Hematology and Oncology

## 2018-10-26 ENCOUNTER — Inpatient Hospital Stay: Payer: Medicare HMO

## 2018-10-26 ENCOUNTER — Telehealth: Payer: Self-pay | Admitting: Hematology and Oncology

## 2018-10-26 ENCOUNTER — Inpatient Hospital Stay (HOSPITAL_BASED_OUTPATIENT_CLINIC_OR_DEPARTMENT_OTHER): Payer: Medicare HMO | Admitting: Hematology and Oncology

## 2018-10-26 DIAGNOSIS — C9002 Multiple myeloma in relapse: Secondary | ICD-10-CM

## 2018-10-26 DIAGNOSIS — Z5111 Encounter for antineoplastic chemotherapy: Secondary | ICD-10-CM | POA: Diagnosis not present

## 2018-10-26 DIAGNOSIS — I10 Essential (primary) hypertension: Secondary | ICD-10-CM

## 2018-10-26 DIAGNOSIS — C9001 Multiple myeloma in remission: Secondary | ICD-10-CM

## 2018-10-26 DIAGNOSIS — D61818 Other pancytopenia: Secondary | ICD-10-CM

## 2018-10-26 DIAGNOSIS — E870 Hyperosmolality and hypernatremia: Secondary | ICD-10-CM | POA: Diagnosis not present

## 2018-10-26 DIAGNOSIS — Z7982 Long term (current) use of aspirin: Secondary | ICD-10-CM | POA: Insufficient documentation

## 2018-10-26 DIAGNOSIS — Z79899 Other long term (current) drug therapy: Secondary | ICD-10-CM | POA: Insufficient documentation

## 2018-10-26 DIAGNOSIS — G893 Neoplasm related pain (acute) (chronic): Secondary | ICD-10-CM | POA: Insufficient documentation

## 2018-10-26 LAB — CBC WITH DIFFERENTIAL/PLATELET
Abs Immature Granulocytes: 0.04 10*3/uL (ref 0.00–0.07)
BASOS ABS: 0.1 10*3/uL (ref 0.0–0.1)
BASOS PCT: 2 %
Eosinophils Absolute: 0 10*3/uL (ref 0.0–0.5)
Eosinophils Relative: 1 %
HCT: 35.7 % — ABNORMAL LOW (ref 36.0–46.0)
Hemoglobin: 11.6 g/dL — ABNORMAL LOW (ref 12.0–15.0)
IMMATURE GRANULOCYTES: 1 %
Lymphocytes Relative: 44 %
Lymphs Abs: 1.8 10*3/uL (ref 0.7–4.0)
MCH: 32.5 pg (ref 26.0–34.0)
MCHC: 32.5 g/dL (ref 30.0–36.0)
MCV: 100 fL (ref 80.0–100.0)
MONO ABS: 0.3 10*3/uL (ref 0.1–1.0)
Monocytes Relative: 9 %
NEUTROS ABS: 1.6 10*3/uL — AB (ref 1.7–7.7)
NEUTROS PCT: 43 %
NRBC: 0 % (ref 0.0–0.2)
PLATELETS: 142 10*3/uL — AB (ref 150–400)
RBC: 3.57 MIL/uL — AB (ref 3.87–5.11)
RDW: 16.1 % — ABNORMAL HIGH (ref 11.5–15.5)
WBC: 3.8 10*3/uL — AB (ref 4.0–10.5)

## 2018-10-26 LAB — COMPREHENSIVE METABOLIC PANEL
ALBUMIN: 3.2 g/dL — AB (ref 3.5–5.0)
ALT: 16 U/L (ref 0–44)
AST: 21 U/L (ref 15–41)
Alkaline Phosphatase: 75 U/L (ref 38–126)
Anion gap: 10 (ref 5–15)
BUN: 8 mg/dL (ref 8–23)
CHLORIDE: 110 mmol/L (ref 98–111)
CO2: 27 mmol/L (ref 22–32)
Calcium: 8.8 mg/dL — ABNORMAL LOW (ref 8.9–10.3)
Creatinine, Ser: 0.73 mg/dL (ref 0.44–1.00)
GFR calc non Af Amer: >60 mL/min (ref >60)
GLUCOSE: 96 mg/dL (ref 70–99)
Potassium: 3.7 mmol/L (ref 3.5–5.1)
SODIUM: 147 mmol/L — AB (ref 135–145)
TOTAL PROTEIN: 5.9 g/dL — AB (ref 6.5–8.1)
Total Bilirubin: 0.4 mg/dL (ref 0.3–1.2)

## 2018-10-26 MED ORDER — BORTEZOMIB CHEMO SQ INJECTION 3.5 MG (2.5MG/ML)
0.9750 mg/m2 | Freq: Once | INTRAMUSCULAR | Status: AC
  Administered 2018-10-26: 1.5 mg via SUBCUTANEOUS
  Filled 2018-10-26: qty 0.6

## 2018-10-26 MED ORDER — PROCHLORPERAZINE MALEATE 10 MG PO TABS
ORAL_TABLET | ORAL | Status: AC
  Filled 2018-10-26: qty 1

## 2018-10-26 MED ORDER — PROCHLORPERAZINE MALEATE 10 MG PO TABS
10.0000 mg | ORAL_TABLET | Freq: Once | ORAL | Status: AC
  Administered 2018-10-26: 10 mg via ORAL

## 2018-10-26 NOTE — Assessment & Plan Note (Signed)
She has chronic back pain from her disease I recommend MS Contin twice a day along with oxycodone as needed for breakthrough pain 

## 2018-10-26 NOTE — Assessment & Plan Note (Signed)
She is noted to have intermittent elevated blood pressure along with hypernatremia On further questioning, it appears that the patient might have excessive salt ingestion We discussed the importance of dietary modification.

## 2018-10-26 NOTE — Assessment & Plan Note (Signed)
Recent myeloma panel showed near complete response to treatment but with mildly elevated light chains We have recently increased the dose of Revlimid and she tolerated that well Thankfully, she has no signs of end organ damage She will continue current prescription Revlimid along with Velcade every other week She will continue calcium with vitamin D supplement and Zometa every 3 months She will continue acyclovir for antimicrobial prophylaxis and aspirin for DVT prophylaxis

## 2018-10-26 NOTE — Assessment & Plan Note (Signed)
She has mild pancytopenia due to treatment She is not symptomatic Observe only We will continue treatment as scheduled

## 2018-10-26 NOTE — Telephone Encounter (Signed)
Gave avs and calendar 12/31 at capacity in tx area

## 2018-10-26 NOTE — Progress Notes (Signed)
[facility] OFFICE PROGRESS NOTE  Patient Care Team: Susy Frizzle, MD as PCP - General (Family Medicine)  ASSESSMENT & PLAN:  Multiple myeloma in relapse [facility]) Recent myeloma panel showed near complete response to treatment but with mildly elevated light chains We have recently increased the dose of Revlimid and she tolerated that well Thankfully, she has no signs of end organ damage She will continue current prescription Revlimid along with Velcade every other week She will continue calcium with vitamin D supplement and Zometa every 3 months She will continue acyclovir for antimicrobial prophylaxis and aspirin for DVT prophylaxis   Acquired pancytopenia ([place]) She has mild pancytopenia due to treatment She is not symptomatic Observe only We will continue treatment as scheduled  Cancer associated pain She has chronic back pain from her disease I recommend MS Contin twice a day along with oxycodone as needed for breakthrough pain  Essential hypertension She is noted to have intermittent elevated blood pressure along with hypernatremia On further questioning, it appears that the patient might have excessive salt ingestion We discussed the importance of dietary modification.   No orders of the defined types were placed in this encounter.   INTERVAL HISTORY: Please see below for problem oriented charting. She returns with her sisters for further follow-up There were no recent reported fall Her pain is well controlled.  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 Revlimid was stopped in July and resumed in October    04/01/2016 Adverse Reaction    Treatment was placed on hold temporarily due to neutropenia     REVIEW OF SYSTEMS:   Constitutional: Denies fevers, chills or abnormal weight loss Eyes: Denies blurriness of vision Ears, nose, mouth, throat, and face: Denies mucositis or sore throat Respiratory: Denies cough, dyspnea or wheezes Cardiovascular: Denies palpitation, chest discomfort or lower extremity swelling Gastrointestinal:  Denies nausea, heartburn or change in bowel habits Skin: Denies abnormal skin rashes Lymphatics: Denies new lymphadenopathy or easy bruising Neurological:Denies numbness, tingling or new weaknesses Behavioral/Psych: Mood is stable, no new changes  All other systems were reviewed with the patient and are negative.  I have reviewed the past medical history, past surgical history, social history and family history with the patient and they are unchanged from previous note.  ALLERGIES:  is allergic to pravastatin and zocor [simvastatin].  MEDICATIONS:  Current Outpatient Medications  Medication Sig Dispense Refill  . acyclovir (ZOVIRAX) 400 MG tablet Take 1 tablet (400 mg total) by mouth daily. 90 tablet 11  . aspirin EC 81 MG tablet Take 81 mg by mouth daily.    . bortezomib IV (VELCADE) 3.5 MG injection Inject 1.3 mg/m2 into the vein once. Once every other week    . Cholecalciferol (VITAMIN D PO) Take by mouth.    . dexamethasone (DECADRON) 1 MG tablet TAKE 1 TABLET BY MOUTH ONCE DAILY 90  tablet 1  . donepezil (ARICEPT) 10 MG tablet Take 1/2 tablet daily for 2 weeks, then increase to 1 tablet daily 30 tablet 11  . lenalidomide (REVLIMID) 15 MG capsule Take 1 every day by mouth for 21 days, then off 7 days 21  capsule 11  . loperamide (IMODIUM) 2 MG capsule Take 1 capsule (2 mg total) by mouth 4 (four) times daily as needed for diarrhea or loose stools. 12 capsule 0  . morphine (MS CONTIN) 15 MG 12 hr tablet Take 1 tablet (15 mg total) by mouth every 12 (twelve) hours. 60 tablet 0  . oxyCODONE-acetaminophen (ROXICET) 5-325 MG tablet Take 1 tablet by mouth every 6 (six) hours as needed for severe pain. 90 tablet 0   No current facility-administered medications for this visit.     PHYSICAL EXAMINATION: ECOG PERFORMANCE STATUS: 1 - Symptomatic but completely ambulatory  Vitals:   10/26/18 0818  BP: (!) 153/73  Pulse: 63  Resp: 18  Temp: 98.2 F (36.8 C)  SpO2: 100%   Filed Weights   10/26/18 0818  Weight: 140 lb 12.8 oz (63.9 kg)    GENERAL:alert, no distress and comfortable SKIN: skin color, texture, turgor are normal, no rashes or significant lesions EYES: normal, Conjunctiva are pink and non-injected, sclera clear OROPHARYNX:no exudate, no erythema and lips, buccal mucosa, and tongue normal  NECK: supple, thyroid normal size, non-tender, without nodularity LYMPH:  no palpable lymphadenopathy in the cervical, axillary or inguinal LUNGS: clear to auscultation and percussion with normal breathing effort HEART: regular rate & rhythm and no murmurs and no lower extremity edema ABDOMEN:abdomen soft, non-tender and normal bowel sounds Musculoskeletal:no cyanosis of digits and no clubbing  NEURO: alert & oriented x 3 with fluent speech, no focal motor/sensory deficits  LABORATORY DATA:  I have reviewed the data as listed    Component Value Date/Time   NA 147 (H) 10/26/2018 0742   NA 145 11/03/2017 0930   K 3.7 10/26/2018 0742   K 3.6 11/03/2017 0930   CL 110 10/26/2018 0742   CO2 27 10/26/2018 0742   CO2 27 11/03/2017 0930   GLUCOSE 96 10/26/2018 0742   GLUCOSE 112 11/03/2017 0930   BUN 8 10/26/2018 0742   BUN 11 12/08/2017 1551   BUN 13.4 11/03/2017 0930   CREATININE 0.73  10/26/2018 0742   CREATININE 0.71 07/06/2018 0912   CREATININE 0.9 11/03/2017 0930   CALCIUM 8.8 (L) 10/26/2018 0742   CALCIUM 9.4 11/03/2017 0930   PROT 5.9 (L) 10/26/2018 0742   PROT 6.2 11/03/2017 0930   PROT 6.9 11/03/2017 0930   ALBUMIN 3.2 (L) 10/26/2018 0742   ALBUMIN 3.2 (L) 11/03/2017 0930   AST 21 10/26/2018 0742   AST 19 07/06/2018 0912   AST 20 11/03/2017 0930   ALT 16 10/26/2018 0742   ALT 17 07/06/2018 0912   ALT 18 11/03/2017 0930   ALKPHOS 75 10/26/2018 0742   ALKPHOS 84 11/03/2017 0930   BILITOT 0.4 10/26/2018 0742   BILITOT 0.7 07/06/2018 0912   BILITOT 0.64 11/03/2017 0930   GFRNONAA >60 10/26/2018 0742   GFRNONAA >60 07/06/2018 0912   GFRNONAA 64 01/17/2016 0910   GFRAA >60 10/26/2018 0742   GFRAA >60 07/06/2018 0912   GFRAA 74 01/17/2016 0910    No results found for: SPEP, UPEP  Lab Results  Component Value Date   WBC 3.8 (L) 10/26/2018   NEUTROABS 1.6 (L) 10/26/2018   HGB 11.6 (L) 10/26/2018   HCT 35.7 (L) 10/26/2018  MCV 100.0 10/26/2018   PLT 142 (L) 10/26/2018      Chemistry      Component Value Date/Time   NA 147 (H) 10/26/2018 0742   NA 145 11/03/2017 0930   K 3.7 10/26/2018 0742   K 3.6 11/03/2017 0930   CL 110 10/26/2018 0742   CO2 27 10/26/2018 0742   CO2 27 11/03/2017 0930   BUN 8 10/26/2018 0742   BUN 11 12/08/2017 1551   BUN 13.4 11/03/2017 0930   CREATININE 0.73 10/26/2018 0742   CREATININE 0.71 07/06/2018 0912   CREATININE 0.9 11/03/2017 0930      Component Value Date/Time   CALCIUM 8.8 (L) 10/26/2018 0742   CALCIUM 9.4 11/03/2017 0930   ALKPHOS 75 10/26/2018 0742   ALKPHOS 84 11/03/2017 0930   AST 21 10/26/2018 0742   AST 19 07/06/2018 0912   AST 20 11/03/2017 0930   ALT 16 10/26/2018 0742   ALT 17 07/06/2018 0912   ALT 18 11/03/2017 0930   BILITOT 0.4 10/26/2018 0742   BILITOT 0.7 07/06/2018 0912   BILITOT 0.64 11/03/2017 0930      All questions were answered. The patient knows to call the clinic with any  problems, questions or concerns. No barriers to learning was detected.  I spent 15 minutes counseling the patient face to face. The total time spent in the appointment was 20 minutes and more than 50% was on counseling and review of test results  Heath Lark, MD 10/26/2018 8:51 AM

## 2018-10-26 NOTE — Patient Instructions (Signed)
[facility] Discharge Instructions for Patients Receiving Chemotherapy  Today you received the following chemotherapy agents Velcade.  To help prevent nausea and vomiting after your treatment, we encourage you to take your nausea medication as directed.  If you develop nausea and vomiting that is not controlled by your nausea medication, call the clinic.   BELOW ARE SYMPTOMS THAT SHOULD BE REPORTED IMMEDIATELY:  *FEVER GREATER THAN 100.5 F  *CHILLS WITH OR WITHOUT FEVER  NAUSEA AND VOMITING THAT IS NOT CONTROLLED WITH YOUR NAUSEA MEDICATION  *UNUSUAL SHORTNESS OF BREATH  *UNUSUAL BRUISING OR BLEEDING  TENDERNESS IN MOUTH AND THROAT WITH OR WITHOUT PRESENCE OF ULCERS  *URINARY PROBLEMS  *BOWEL PROBLEMS  UNUSUAL RASH Items with * indicate a potential emergency and should be followed up as soon as possible.  Feel free to call the clinic should you have any questions or concerns. The clinic phone number is [phone].  Please show the CHEMO ALERT CARD at check-in to the Emergency Department and triage nurse.   

## 2018-10-27 DIAGNOSIS — C9002 Multiple myeloma in relapse: Secondary | ICD-10-CM | POA: Diagnosis not present

## 2018-10-27 LAB — KAPPA/LAMBDA LIGHT CHAINS
Kappa free light chain: 329.6 mg/L — ABNORMAL HIGH (ref 3.3–19.4)
Kappa, lambda light chain ratio: 45.78 — ABNORMAL HIGH (ref 0.26–1.65)
LAMDA FREE LIGHT CHAINS: 7.2 mg/L (ref 5.7–26.3)

## 2018-10-28 DIAGNOSIS — C9002 Multiple myeloma in relapse: Secondary | ICD-10-CM | POA: Diagnosis not present

## 2018-10-29 DIAGNOSIS — C9002 Multiple myeloma in relapse: Secondary | ICD-10-CM | POA: Diagnosis not present

## 2018-10-30 DIAGNOSIS — C9002 Multiple myeloma in relapse: Secondary | ICD-10-CM | POA: Diagnosis not present

## 2018-10-31 DIAGNOSIS — C9002 Multiple myeloma in relapse: Secondary | ICD-10-CM | POA: Diagnosis not present

## 2018-11-01 DIAGNOSIS — C9002 Multiple myeloma in relapse: Secondary | ICD-10-CM | POA: Diagnosis not present

## 2018-11-01 LAB — MULTIPLE MYELOMA PANEL, SERUM
ALBUMIN SERPL ELPH-MCNC: 3.1 g/dL (ref 2.9–4.4)
ALBUMIN/GLOB SERPL: 1.4 (ref 0.7–1.7)
ALPHA 1: 0.2 g/dL (ref 0.0–0.4)
ALPHA2 GLOB SERPL ELPH-MCNC: 0.7 g/dL (ref 0.4–1.0)
B-Globulin SerPl Elph-Mcnc: 1 g/dL (ref 0.7–1.3)
Gamma Glob SerPl Elph-Mcnc: 0.3 g/dL — ABNORMAL LOW (ref 0.4–1.8)
Globulin, Total: 2.3 g/dL (ref 2.2–3.9)
IGA: 219 mg/dL (ref 64–422)
IGG (IMMUNOGLOBIN G), SERUM: 459 mg/dL — AB (ref 700–1600)
IGM (IMMUNOGLOBULIN M), SRM: 6 mg/dL — AB (ref 26–217)
M Protein SerPl Elph-Mcnc: 0.3 g/dL — ABNORMAL HIGH
TOTAL PROTEIN ELP: 5.4 g/dL — AB (ref 6.0–8.5)

## 2018-11-02 DIAGNOSIS — C9002 Multiple myeloma in relapse: Secondary | ICD-10-CM | POA: Diagnosis not present

## 2018-11-03 DIAGNOSIS — C9002 Multiple myeloma in relapse: Secondary | ICD-10-CM | POA: Diagnosis not present

## 2018-11-04 DIAGNOSIS — C9002 Multiple myeloma in relapse: Secondary | ICD-10-CM | POA: Diagnosis not present

## 2018-11-05 DIAGNOSIS — C9002 Multiple myeloma in relapse: Secondary | ICD-10-CM | POA: Diagnosis not present

## 2018-11-06 DIAGNOSIS — C9002 Multiple myeloma in relapse: Secondary | ICD-10-CM | POA: Diagnosis not present

## 2018-11-07 DIAGNOSIS — C9002 Multiple myeloma in relapse: Secondary | ICD-10-CM | POA: Diagnosis not present

## 2018-11-08 DIAGNOSIS — C9002 Multiple myeloma in relapse: Secondary | ICD-10-CM | POA: Diagnosis not present

## 2018-11-09 DIAGNOSIS — C9002 Multiple myeloma in relapse: Secondary | ICD-10-CM | POA: Diagnosis not present

## 2018-11-10 DIAGNOSIS — C9002 Multiple myeloma in relapse: Secondary | ICD-10-CM | POA: Diagnosis not present

## 2018-11-11 DIAGNOSIS — C9002 Multiple myeloma in relapse: Secondary | ICD-10-CM | POA: Diagnosis not present

## 2018-11-12 DIAGNOSIS — C9002 Multiple myeloma in relapse: Secondary | ICD-10-CM | POA: Diagnosis not present

## 2018-11-13 DIAGNOSIS — C9002 Multiple myeloma in relapse: Secondary | ICD-10-CM | POA: Diagnosis not present

## 2018-11-14 DIAGNOSIS — C9002 Multiple myeloma in relapse: Secondary | ICD-10-CM | POA: Diagnosis not present

## 2018-11-15 ENCOUNTER — Inpatient Hospital Stay: Payer: Medicare HMO

## 2018-11-15 VITALS — BP 113/66 | HR 55 | Temp 98.4°F | Resp 18

## 2018-11-15 DIAGNOSIS — C9002 Multiple myeloma in relapse: Secondary | ICD-10-CM | POA: Diagnosis not present

## 2018-11-15 DIAGNOSIS — C9001 Multiple myeloma in remission: Secondary | ICD-10-CM

## 2018-11-15 DIAGNOSIS — D61818 Other pancytopenia: Secondary | ICD-10-CM | POA: Diagnosis not present

## 2018-11-15 DIAGNOSIS — Z5111 Encounter for antineoplastic chemotherapy: Secondary | ICD-10-CM | POA: Diagnosis not present

## 2018-11-15 DIAGNOSIS — Z79899 Other long term (current) drug therapy: Secondary | ICD-10-CM | POA: Diagnosis not present

## 2018-11-15 DIAGNOSIS — Z7982 Long term (current) use of aspirin: Secondary | ICD-10-CM | POA: Diagnosis not present

## 2018-11-15 DIAGNOSIS — I10 Essential (primary) hypertension: Secondary | ICD-10-CM | POA: Diagnosis not present

## 2018-11-15 DIAGNOSIS — E870 Hyperosmolality and hypernatremia: Secondary | ICD-10-CM | POA: Diagnosis not present

## 2018-11-15 DIAGNOSIS — G893 Neoplasm related pain (acute) (chronic): Secondary | ICD-10-CM | POA: Diagnosis not present

## 2018-11-15 LAB — COMPREHENSIVE METABOLIC PANEL
ALT: 19 U/L (ref 0–44)
AST: 19 U/L (ref 15–41)
Albumin: 3.5 g/dL (ref 3.5–5.0)
Alkaline Phosphatase: 79 U/L (ref 38–126)
Anion gap: 12 (ref 5–15)
BUN: 12 mg/dL (ref 8–23)
CO2: 26 mmol/L (ref 22–32)
Calcium: 8.7 mg/dL — ABNORMAL LOW (ref 8.9–10.3)
Chloride: 108 mmol/L (ref 98–111)
Creatinine, Ser: 0.78 mg/dL (ref 0.44–1.00)
GFR calc Af Amer: >60 mL/min (ref >60)
GFR calc non Af Amer: >60 mL/min (ref >60)
Glucose, Bld: 124 mg/dL — ABNORMAL HIGH (ref 70–99)
Potassium: 3.8 mmol/L (ref 3.5–5.1)
Sodium: 146 mmol/L — ABNORMAL HIGH (ref 135–145)
Total Bilirubin: 0.7 mg/dL (ref 0.3–1.2)
Total Protein: 6.5 g/dL (ref 6.5–8.1)

## 2018-11-15 LAB — CBC WITH DIFFERENTIAL/PLATELET
Abs Immature Granulocytes: 0.03 K/uL (ref 0.00–0.07)
Basophils Absolute: 0 K/uL (ref 0.0–0.1)
Basophils Relative: 0 %
Eosinophils Absolute: 0.1 K/uL (ref 0.0–0.5)
Eosinophils Relative: 2 %
HCT: 38.4 % (ref 36.0–46.0)
Hemoglobin: 12 g/dL (ref 12.0–15.0)
Immature Granulocytes: 1 %
Lymphocytes Relative: 27 %
Lymphs Abs: 1.1 K/uL (ref 0.7–4.0)
MCH: 32.3 pg (ref 26.0–34.0)
MCHC: 31.3 g/dL (ref 30.0–36.0)
MCV: 103.5 fL — ABNORMAL HIGH (ref 80.0–100.0)
Monocytes Absolute: 0.2 K/uL (ref 0.1–1.0)
Monocytes Relative: 6 %
Neutro Abs: 2.6 K/uL (ref 1.7–7.7)
Neutrophils Relative %: 64 %
Platelets: 109 K/uL — ABNORMAL LOW (ref 150–400)
RBC: 3.71 MIL/uL — ABNORMAL LOW (ref 3.87–5.11)
RDW: 16.1 % — ABNORMAL HIGH (ref 11.5–15.5)
WBC: 4 K/uL (ref 4.0–10.5)
nRBC: 0 % (ref 0.0–0.2)

## 2018-11-15 MED ORDER — PROCHLORPERAZINE MALEATE 10 MG PO TABS
10.0000 mg | ORAL_TABLET | Freq: Once | ORAL | Status: AC
  Administered 2018-11-15: 10 mg via ORAL

## 2018-11-15 MED ORDER — BORTEZOMIB CHEMO SQ INJECTION 3.5 MG (2.5MG/ML)
0.9750 mg/m2 | Freq: Once | INTRAMUSCULAR | Status: AC
  Administered 2018-11-15: 1.5 mg via SUBCUTANEOUS
  Filled 2018-11-15: qty 0.6

## 2018-11-15 MED ORDER — PROCHLORPERAZINE MALEATE 10 MG PO TABS
ORAL_TABLET | ORAL | Status: AC
  Filled 2018-11-15: qty 1

## 2018-11-15 MED FILL — DEXAMETHASONE 1 MG TABLET: 1 | 90 days supply | Qty: 90 | Fill #0

## 2018-11-15 NOTE — Patient Instructions (Signed)
[facility] Discharge Instructions for Patients Receiving Chemotherapy  Today you received the following chemotherapy agents Velcade To help prevent nausea and vomiting after your treatment, we encourage you to take your nausea medication as prescribed.   If you develop nausea and vomiting that is not controlled by your nausea medication, call the clinic.   BELOW ARE SYMPTOMS THAT SHOULD BE REPORTED IMMEDIATELY:  *FEVER GREATER THAN 100.5 F  *CHILLS WITH OR WITHOUT FEVER  NAUSEA AND VOMITING THAT IS NOT CONTROLLED WITH YOUR NAUSEA MEDICATION  *UNUSUAL SHORTNESS OF BREATH  *UNUSUAL BRUISING OR BLEEDING  TENDERNESS IN MOUTH AND THROAT WITH OR WITHOUT PRESENCE OF ULCERS  *URINARY PROBLEMS  *BOWEL PROBLEMS  UNUSUAL RASH Items with * indicate a potential emergency and should be followed up as soon as possible.  Feel free to call the clinic should you have any questions or concerns. The clinic phone number is [phone].  Please show the CHEMO ALERT CARD at check-in to the Emergency Department and triage nurse.   

## 2018-11-16 ENCOUNTER — Other Ambulatory Visit: Payer: Self-pay | Admitting: *Deleted

## 2018-11-16 DIAGNOSIS — C9002 Multiple myeloma in relapse: Secondary | ICD-10-CM | POA: Diagnosis not present

## 2018-11-16 MED ORDER — LENALIDOMIDE 15 MG PO CAPS: ORAL_CAPSULE | ORAL | 0 refills | Status: DC

## 2018-11-17 DIAGNOSIS — C9002 Multiple myeloma in relapse: Secondary | ICD-10-CM | POA: Diagnosis not present

## 2018-11-18 ENCOUNTER — Other Ambulatory Visit: Payer: Self-pay | Admitting: Hematology and Oncology

## 2018-11-18 DIAGNOSIS — C9002 Multiple myeloma in relapse: Secondary | ICD-10-CM | POA: Diagnosis not present

## 2018-11-18 MED FILL — DONEPEZIL HCL 10 MG TABLET: 10 | 30 days supply | Qty: 30 | Fill #6

## 2018-11-19 DIAGNOSIS — C9002 Multiple myeloma in relapse: Secondary | ICD-10-CM | POA: Diagnosis not present

## 2018-11-19 MED FILL — LISINOPRIL 20 MG TABLET: 20 | 90 days supply | Qty: 90 | Fill #0

## 2018-11-19 NOTE — Telephone Encounter (Signed)
I will refill 20 mg then

## 2018-11-19 NOTE — Telephone Encounter (Signed)
Can you ask her sister if she is still taking this?

## 2018-11-19 NOTE — Telephone Encounter (Signed)
She is taking half a tablet daily.

## 2018-11-20 DIAGNOSIS — C9002 Multiple myeloma in relapse: Secondary | ICD-10-CM | POA: Diagnosis not present

## 2018-11-21 DIAGNOSIS — C9002 Multiple myeloma in relapse: Secondary | ICD-10-CM | POA: Diagnosis not present

## 2018-11-22 ENCOUNTER — Ambulatory Visit: Payer: Medicare HMO | Admitting: Neurology

## 2018-11-22 DIAGNOSIS — C9002 Multiple myeloma in relapse: Secondary | ICD-10-CM | POA: Diagnosis not present

## 2018-11-23 DIAGNOSIS — C9002 Multiple myeloma in relapse: Secondary | ICD-10-CM | POA: Diagnosis not present

## 2018-11-24 DIAGNOSIS — C9002 Multiple myeloma in relapse: Secondary | ICD-10-CM | POA: Diagnosis not present

## 2018-11-25 DIAGNOSIS — C9002 Multiple myeloma in relapse: Secondary | ICD-10-CM | POA: Diagnosis not present

## 2018-11-26 DIAGNOSIS — C9002 Multiple myeloma in relapse: Secondary | ICD-10-CM | POA: Diagnosis not present

## 2018-11-27 DIAGNOSIS — C9002 Multiple myeloma in relapse: Secondary | ICD-10-CM | POA: Diagnosis not present

## 2018-11-28 DIAGNOSIS — C9002 Multiple myeloma in relapse: Secondary | ICD-10-CM | POA: Diagnosis not present

## 2018-11-29 DIAGNOSIS — C9002 Multiple myeloma in relapse: Secondary | ICD-10-CM | POA: Diagnosis not present

## 2018-11-30 ENCOUNTER — Telehealth: Payer: Self-pay | Admitting: Hematology and Oncology

## 2018-11-30 ENCOUNTER — Inpatient Hospital Stay (HOSPITAL_BASED_OUTPATIENT_CLINIC_OR_DEPARTMENT_OTHER): Payer: Medicare HMO | Admitting: Hematology and Oncology

## 2018-11-30 ENCOUNTER — Encounter: Payer: Self-pay | Admitting: Hematology and Oncology

## 2018-11-30 ENCOUNTER — Inpatient Hospital Stay: Payer: Medicare HMO | Attending: Hematology and Oncology

## 2018-11-30 ENCOUNTER — Inpatient Hospital Stay: Payer: Medicare HMO

## 2018-11-30 DIAGNOSIS — Z79899 Other long term (current) drug therapy: Secondary | ICD-10-CM | POA: Diagnosis not present

## 2018-11-30 DIAGNOSIS — Z7982 Long term (current) use of aspirin: Secondary | ICD-10-CM | POA: Insufficient documentation

## 2018-11-30 DIAGNOSIS — E870 Hyperosmolality and hypernatremia: Secondary | ICD-10-CM | POA: Insufficient documentation

## 2018-11-30 DIAGNOSIS — C9002 Multiple myeloma in relapse: Secondary | ICD-10-CM | POA: Diagnosis not present

## 2018-11-30 DIAGNOSIS — D61818 Other pancytopenia: Secondary | ICD-10-CM | POA: Diagnosis not present

## 2018-11-30 DIAGNOSIS — M549 Dorsalgia, unspecified: Secondary | ICD-10-CM | POA: Diagnosis not present

## 2018-11-30 DIAGNOSIS — Z5111 Encounter for antineoplastic chemotherapy: Secondary | ICD-10-CM | POA: Insufficient documentation

## 2018-11-30 DIAGNOSIS — R634 Abnormal weight loss: Secondary | ICD-10-CM

## 2018-11-30 DIAGNOSIS — Z66 Do not resuscitate: Secondary | ICD-10-CM | POA: Insufficient documentation

## 2018-11-30 DIAGNOSIS — R64 Cachexia: Secondary | ICD-10-CM

## 2018-11-30 DIAGNOSIS — R630 Anorexia: Secondary | ICD-10-CM | POA: Diagnosis not present

## 2018-11-30 DIAGNOSIS — G893 Neoplasm related pain (acute) (chronic): Secondary | ICD-10-CM

## 2018-11-30 DIAGNOSIS — Z9221 Personal history of antineoplastic chemotherapy: Secondary | ICD-10-CM | POA: Insufficient documentation

## 2018-11-30 DIAGNOSIS — F0390 Unspecified dementia without behavioral disturbance: Secondary | ICD-10-CM | POA: Insufficient documentation

## 2018-11-30 DIAGNOSIS — M48061 Spinal stenosis, lumbar region without neurogenic claudication: Secondary | ICD-10-CM

## 2018-11-30 DIAGNOSIS — C9001 Multiple myeloma in remission: Secondary | ICD-10-CM

## 2018-11-30 LAB — CBC WITH DIFFERENTIAL/PLATELET
Abs Immature Granulocytes: 0.04 10*3/uL (ref 0.00–0.07)
Basophils Absolute: 0.1 10*3/uL (ref 0.0–0.1)
Basophils Relative: 2 %
Eosinophils Absolute: 0.1 10*3/uL (ref 0.0–0.5)
Eosinophils Relative: 3 %
HCT: 41.5 % (ref 36.0–46.0)
Hemoglobin: 13.3 g/dL (ref 12.0–15.0)
Immature Granulocytes: 1 %
Lymphocytes Relative: 48 %
Lymphs Abs: 1.5 10*3/uL (ref 0.7–4.0)
MCH: 32.5 pg (ref 26.0–34.0)
MCHC: 32 g/dL (ref 30.0–36.0)
MCV: 101.5 fL — ABNORMAL HIGH (ref 80.0–100.0)
Monocytes Absolute: 0.3 10*3/uL (ref 0.1–1.0)
Monocytes Relative: 9 %
Neutro Abs: 1.2 10*3/uL — ABNORMAL LOW (ref 1.7–7.7)
Neutrophils Relative %: 37 %
Platelets: 134 10*3/uL — ABNORMAL LOW (ref 150–400)
RBC: 4.09 MIL/uL (ref 3.87–5.11)
RDW: 15.9 % — ABNORMAL HIGH (ref 11.5–15.5)
WBC: 3.2 10*3/uL — ABNORMAL LOW (ref 4.0–10.5)
nRBC: 0 % (ref 0.0–0.2)

## 2018-11-30 LAB — COMPREHENSIVE METABOLIC PANEL
ALT: 14 U/L (ref 0–44)
AST: 16 U/L (ref 15–41)
Albumin: 3.6 g/dL (ref 3.5–5.0)
Alkaline Phosphatase: 79 U/L (ref 38–126)
Anion gap: 12 (ref 5–15)
BUN: 13 mg/dL (ref 8–23)
CHLORIDE: 108 mmol/L (ref 98–111)
CO2: 27 mmol/L (ref 22–32)
Calcium: 9.2 mg/dL (ref 8.9–10.3)
Creatinine, Ser: 0.78 mg/dL (ref 0.44–1.00)
GFR calc Af Amer: >60 mL/min (ref >60)
Glucose, Bld: 105 mg/dL — ABNORMAL HIGH (ref 70–99)
Potassium: 3.6 mmol/L (ref 3.5–5.1)
Sodium: 147 mmol/L — ABNORMAL HIGH (ref 135–145)
Total Bilirubin: 0.7 mg/dL (ref 0.3–1.2)
Total Protein: 6.6 g/dL (ref 6.5–8.1)

## 2018-11-30 MED ORDER — OXYCODONE-ACETAMINOPHEN 5-325 MG PO TABS: 1.0000 | ORAL_TABLET | Freq: Four times a day (QID) | ORAL | 0 refills | Status: DC | PRN

## 2018-11-30 MED ORDER — MORPHINE SULFATE ER 15 MG PO TBCR: 15.0000 mg | EXTENDED_RELEASE_TABLET | Freq: Two times a day (BID) | ORAL | 0 refills | Status: DC

## 2018-11-30 MED ORDER — PROCHLORPERAZINE MALEATE 10 MG PO TABS
ORAL_TABLET | ORAL | Status: AC
  Filled 2018-11-30: qty 1

## 2018-11-30 MED ORDER — PROCHLORPERAZINE MALEATE 10 MG PO TABS
10.0000 mg | ORAL_TABLET | Freq: Once | ORAL | Status: AC
  Administered 2018-11-30: 10 mg via ORAL

## 2018-11-30 MED ORDER — BORTEZOMIB CHEMO SQ INJECTION 3.5 MG (2.5MG/ML)
0.9750 mg/m2 | Freq: Once | INTRAMUSCULAR | Status: AC
  Administered 2018-11-30: 1.5 mg via SUBCUTANEOUS
  Filled 2018-11-30: qty 0.6

## 2018-11-30 MED FILL — MORPHINE SULF ER 15 MG TAB: 15 | 30 days supply | Qty: 60 | Fill #0

## 2018-11-30 MED FILL — OXYCODONE-ACETAMINOPHEN 5-3: 5-325 | 22 days supply | Qty: 90 | Fill #0

## 2018-11-30 NOTE — Telephone Encounter (Signed)
Gave avs and calendar ° °

## 2018-11-30 NOTE — Assessment & Plan Note (Signed)
She has mild pancytopenia due to treatment She is not symptomatic Observe only We will continue treatment as scheduled

## 2018-11-30 NOTE — Assessment & Plan Note (Signed)
She has poor oral intake and has lost some weight recently She is also found to have mild hypernatremia secondary to poor oral fluid intake I encouraged her to drink more fluids as tolerated

## 2018-11-30 NOTE — Progress Notes (Signed)
Kimberly Friedman  Patient Care Team: Kimberly Frizzle, MD as PCP - General (Family Medicine)  ASSESSMENT & PLAN:  Multiple myeloma in relapse Kimberly Friedman) Kimberly Friedman recent myeloma panel show significant drastic change of Kimberly Friedman serum light chain with mildly elevated M protein. I suspect could be due to dehydration, especially in view of evidence of severe hypernatremia Repeat myeloma panel today is pending If Kimberly Friedman myeloma panel confirm suspicion with disease progression, I will discuss treatment options with Kimberly Friedman sisters in Kimberly Friedman next visit. In the meantime, we will proceed with chemotherapy as scheduled with dexamethasone, Revlimid and Velcade  Acquired pancytopenia ([place]) She has mild pancytopenia due to treatment She is not symptomatic Observe only We will continue treatment as scheduled  Malignant cachexia ([place]) She has poor oral intake and has lost some weight recently She is also found to have mild hypernatremia secondary to poor oral fluid intake I encouraged Kimberly Friedman to drink more fluids as tolerated  Cancer associated pain She has chronic back pain from Kimberly Friedman disease I recommend MS Contin twice a day along with oxycodone as needed for breakthrough pain I refilled Kimberly Friedman prescription pain medicine today   No orders of the defined types were placed in this encounter.   INTERVAL HISTORY: Please see below for problem oriented charting. She returns for further follow-up Kimberly Friedman sister did not report any recent fall infection Kimberly Friedman bone pain is stable She has lost some weight again She denies nausea No peripheral neuropathy from treatment She bruises easily The patient denies any recent signs or symptoms of bleeding such as spontaneous epistaxis, hematuria or hematochezia.  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 Revlimid was stopped in July and resumed in October    04/01/2016 Adverse Reaction    Treatment was placed on hold temporarily due to neutropenia     REVIEW OF SYSTEMS:   Constitutional: Denies fevers, chills or abnormal weight loss Eyes: Denies blurriness of vision Ears, nose, mouth, throat, and face: Denies mucositis or sore throat Respiratory: Denies cough, dyspnea or wheezes Cardiovascular: Denies palpitation, chest discomfort or lower extremity swelling Gastrointestinal:  Denies nausea, heartburn or change in bowel habits Skin: Denies abnormal skin rashes Lymphatics: Denies new lymphadenopathy Neurological:Denies numbness, tingling or new weaknesses Behavioral/Psych: Mood is stable, no new changes  All other systems were reviewed with the patient and are negative.  I have reviewed the past medical history, past surgical history, social history and family history with the patient and they are unchanged from previous Friedman.  ALLERGIES:  is allergic to pravastatin and zocor [simvastatin].  MEDICATIONS:  Current Outpatient Medications  Medication Sig Dispense Refill  . acyclovir (ZOVIRAX) 400 MG tablet Take 1 tablet (400 mg total) by mouth daily. 90 tablet 11  . aspirin EC 81 MG tablet Take 81 mg by mouth daily.    . bortezomib IV (VELCADE) 3.5 MG injection Inject 1.3 mg/m2 into the vein once. Once every  other week    . Cholecalciferol (VITAMIN D PO) Take by mouth.    . dexamethasone (DECADRON) 1 MG tablet TAKE 1 TABLET BY MOUTH ONCE DAILY 90 tablet 1  . donepezil (ARICEPT) 10 MG tablet Take 1/2 tablet daily for 2 weeks, then  increase to 1 tablet daily 30 tablet 11  . lenalidomide (REVLIMID) 15 MG capsule Take 1 every day by mouth for 21 days, then off 7 days 21 capsule 0  . lisinopril (PRINIVIL,ZESTRIL) 20 MG tablet Take 1 tablet (20 mg total) by mouth daily. 90 tablet 11  . loperamide (IMODIUM) 2 MG capsule Take 1 capsule (2 mg total) by mouth 4 (four) times daily as needed for diarrhea or loose stools. 12 capsule 0  . morphine (MS CONTIN) 15 MG 12 hr tablet Take 1 tablet (15 mg total) by mouth every 12 (twelve) hours. 60 tablet 0  . oxyCODONE-acetaminophen (ROXICET) 5-325 MG tablet Take 1 tablet by mouth every 6 (six) hours as needed for severe pain. 90 tablet 0   No current facility-administered medications for this visit.    Facility-Administered Medications Ordered in Other Visits  Medication Dose Route Frequency Provider Last Rate Last Dose  . bortezomib SQ (VELCADE) chemo injection 1.5 mg  0.975 mg/m2 (Treatment Plan Recorded) Subcutaneous Once Heath Lark, MD        PHYSICAL EXAMINATION: ECOG PERFORMANCE STATUS: 1 - Symptomatic but completely ambulatory  Vitals:   11/30/18 0824  BP: (!) 127/47  Pulse: 64  Resp: 18  Temp: 97.7 F (36.5 C)  SpO2: 99%   Filed Weights   11/30/18 0824  Weight: 134 lb 9.6 oz (61.1 kg)    GENERAL:alert, no distress and comfortable SKIN: skin color, texture, turgor are normal, no rashes or significant lesions.  Noted skin bruising EYES: normal, Conjunctiva are pink and non-injected, sclera clear OROPHARYNX:no exudate, no erythema and lips, buccal mucosa, and tongue normal  NECK: supple, thyroid normal size, non-tender, without nodularity LYMPH:  no palpable lymphadenopathy in the cervical, axillary or inguinal LUNGS: clear to auscultation and percussion with normal breathing effort HEART: regular rate & rhythm and no murmurs and no lower extremity edema ABDOMEN:abdomen soft, non-tender and normal bowel sounds Musculoskeletal:no cyanosis of digits and no clubbing   NEURO: alert & oriented x 3 with fluent speech, no focal motor/sensory deficits  LABORATORY DATA:  I have reviewed the data as listed    Component Value Date/Time   NA 147 (H) 11/30/2018 0736   NA 145 11/03/2017 0930   K 3.6 11/30/2018 0736   K 3.6 11/03/2017 0930   CL 108 11/30/2018 0736   CO2 27 11/30/2018 0736   CO2 27 11/03/2017 0930   GLUCOSE 105 (H) 11/30/2018 0736   GLUCOSE 112 11/03/2017 0930   BUN 13 11/30/2018 0736   BUN 11 12/08/2017 1551   BUN 13.4 11/03/2017 0930   CREATININE 0.78 11/30/2018 0736   CREATININE 0.71 07/06/2018 0912   CREATININE 0.9 11/03/2017 0930   CALCIUM 9.2 11/30/2018 0736   CALCIUM 9.4 11/03/2017 0930   PROT 6.6 11/30/2018 0736   PROT 6.2 11/03/2017 0930   PROT 6.9 11/03/2017 0930   ALBUMIN 3.6 11/30/2018 0736   ALBUMIN 3.2 (L) 11/03/2017 0930   AST 16 11/30/2018 0736   AST 19 07/06/2018 0912   AST 20 11/03/2017 0930   ALT 14 11/30/2018 0736   ALT 17 07/06/2018 0912   ALT 18 11/03/2017 0930   ALKPHOS 79 11/30/2018 0736   ALKPHOS 84 11/03/2017 0930  BILITOT 0.7 11/30/2018 0736   BILITOT 0.7 07/06/2018 0912   BILITOT 0.64 11/03/2017 0930   GFRNONAA >60 11/30/2018 0736   GFRNONAA >60 07/06/2018 0912   GFRNONAA 64 01/17/2016 0910   GFRAA >60 11/30/2018 0736   GFRAA >60 07/06/2018 0912   GFRAA 74 01/17/2016 0910    No results found for: SPEP, UPEP  Lab Results  Component Value Date   WBC 3.2 (L) 11/30/2018   NEUTROABS 1.2 (L) 11/30/2018   HGB 13.3 11/30/2018   HCT 41.5 11/30/2018   MCV 101.5 (H) 11/30/2018   PLT 134 (L) 11/30/2018      Chemistry      Component Value Date/Time   NA 147 (H) 11/30/2018 0736   NA 145 11/03/2017 0930   K 3.6 11/30/2018 0736   K 3.6 11/03/2017 0930   CL 108 11/30/2018 0736   CO2 27 11/30/2018 0736   CO2 27 11/03/2017 0930   BUN 13 11/30/2018 0736   BUN 11 12/08/2017 1551   BUN 13.4 11/03/2017 0930   CREATININE 0.78 11/30/2018 0736   CREATININE 0.71 07/06/2018 0912   CREATININE 0.9  11/03/2017 0930      Component Value Date/Time   CALCIUM 9.2 11/30/2018 0736   CALCIUM 9.4 11/03/2017 0930   ALKPHOS 79 11/30/2018 0736   ALKPHOS 84 11/03/2017 0930   AST 16 11/30/2018 0736   AST 19 07/06/2018 0912   AST 20 11/03/2017 0930   ALT 14 11/30/2018 0736   ALT 17 07/06/2018 0912   ALT 18 11/03/2017 0930   BILITOT 0.7 11/30/2018 0736   BILITOT 0.7 07/06/2018 0912   BILITOT 0.64 11/03/2017 0930      All questions were answered. The patient knows to call the clinic with any problems, questions or concerns. No barriers to learning was detected.  I spent 15 minutes counseling the patient face to face. The total time spent in the appointment was 20 minutes and more than 50% was on counseling and review of test results  Heath Lark, MD 11/30/2018 9:11 AM

## 2018-11-30 NOTE — Patient Instructions (Signed)
[facility] Discharge Instructions for Patients Receiving Chemotherapy  Today you received the following chemotherapy agents Velcade.  To help prevent nausea and vomiting after your treatment, we encourage you to take your nausea medication as directed.  If you develop nausea and vomiting that is not controlled by your nausea medication, call the clinic.   BELOW ARE SYMPTOMS THAT SHOULD BE REPORTED IMMEDIATELY:  *FEVER GREATER THAN 100.5 F  *CHILLS WITH OR WITHOUT FEVER  NAUSEA AND VOMITING THAT IS NOT CONTROLLED WITH YOUR NAUSEA MEDICATION  *UNUSUAL SHORTNESS OF BREATH  *UNUSUAL BRUISING OR BLEEDING  TENDERNESS IN MOUTH AND THROAT WITH OR WITHOUT PRESENCE OF ULCERS  *URINARY PROBLEMS  *BOWEL PROBLEMS  UNUSUAL RASH Items with * indicate a potential emergency and should be followed up as soon as possible.  Feel free to call the clinic should you have any questions or concerns. The clinic phone number is [phone].  Please show the CHEMO ALERT CARD at check-in to the Emergency Department and triage nurse.   

## 2018-11-30 NOTE — Assessment & Plan Note (Signed)
She has chronic back pain from her disease I recommend MS Contin twice a day along with oxycodone as needed for breakthrough pain I refilled her prescription pain medicine today

## 2018-11-30 NOTE — Assessment & Plan Note (Signed)
Her recent myeloma panel show significant drastic change of her serum light chain with mildly elevated M protein. I suspect could be due to dehydration, especially in view of evidence of severe hypernatremia Repeat myeloma panel today is pending If her myeloma panel confirm suspicion with disease progression, I will discuss treatment options with her sisters in her next visit. In the meantime, we will proceed with chemotherapy as scheduled with dexamethasone, Revlimid and Velcade

## 2018-12-01 DIAGNOSIS — C9002 Multiple myeloma in relapse: Secondary | ICD-10-CM | POA: Diagnosis not present

## 2018-12-01 LAB — KAPPA/LAMBDA LIGHT CHAINS
Kappa free light chain: 402.6 mg/L — ABNORMAL HIGH (ref 3.3–19.4)
Kappa, lambda light chain ratio: 70.63 — ABNORMAL HIGH (ref 0.26–1.65)
LAMDA FREE LIGHT CHAINS: 5.7 mg/L (ref 5.7–26.3)

## 2018-12-02 ENCOUNTER — Telehealth: Payer: Self-pay

## 2018-12-02 ENCOUNTER — Telehealth: Payer: Self-pay | Admitting: Hematology and Oncology

## 2018-12-02 DIAGNOSIS — C9002 Multiple myeloma in relapse: Secondary | ICD-10-CM | POA: Diagnosis not present

## 2018-12-02 LAB — MULTIPLE MYELOMA PANEL, SERUM
Albumin SerPl Elph-Mcnc: 3.5 g/dL (ref 2.9–4.4)
Albumin/Glob SerPl: 1.4 (ref 0.7–1.7)
Alpha 1: 0.3 g/dL (ref 0.0–0.4)
Alpha2 Glob SerPl Elph-Mcnc: 0.8 g/dL (ref 0.4–1.0)
B-GLOBULIN SERPL ELPH-MCNC: 1.1 g/dL (ref 0.7–1.3)
Gamma Glob SerPl Elph-Mcnc: 0.4 g/dL (ref 0.4–1.8)
Globulin, Total: 2.6 g/dL (ref 2.2–3.9)
IGG (IMMUNOGLOBIN G), SERUM: 455 mg/dL — AB (ref 700–1600)
IgA: 303 mg/dL (ref 64–422)
IgM (Immunoglobulin M), Srm: 8 mg/dL — ABNORMAL LOW (ref 26–217)
M PROTEIN SERPL ELPH-MCNC: 0.3 g/dL — AB
Total Protein ELP: 6.1 g/dL (ref 6.0–8.5)

## 2018-12-02 NOTE — Telephone Encounter (Signed)
Added apt per 1/16 sch message - pt sister is aware of appt added.

## 2018-12-02 NOTE — Telephone Encounter (Signed)
Called and given below message. She verbalized understanding. 

## 2018-12-02 NOTE — Telephone Encounter (Signed)
-----   Message from Heath Lark, MD sent at 12/02/2018  8:06 AM EST ----- Regarding: abnormal result Pls call her sister. Light chains are worse I have sent scheduling msg to see her on 1/28 before her chemo ----- Message ----- From: Interface, Lab In [place] Sent: 11/30/2018   8:04 AM EST To: Heath Lark, MD

## 2018-12-03 DIAGNOSIS — C9002 Multiple myeloma in relapse: Secondary | ICD-10-CM | POA: Diagnosis not present

## 2018-12-04 DIAGNOSIS — C9002 Multiple myeloma in relapse: Secondary | ICD-10-CM | POA: Diagnosis not present

## 2018-12-05 DIAGNOSIS — C9002 Multiple myeloma in relapse: Secondary | ICD-10-CM | POA: Diagnosis not present

## 2018-12-13 DIAGNOSIS — C9002 Multiple myeloma in relapse: Secondary | ICD-10-CM | POA: Diagnosis not present

## 2018-12-13 MED FILL — DONEPEZIL HCL 10 MG TABLET: 10 | 30 days supply | Qty: 30 | Fill #7

## 2018-12-14 ENCOUNTER — Inpatient Hospital Stay (HOSPITAL_BASED_OUTPATIENT_CLINIC_OR_DEPARTMENT_OTHER): Payer: Medicare HMO | Admitting: Hematology and Oncology

## 2018-12-14 ENCOUNTER — Inpatient Hospital Stay: Payer: Medicare HMO

## 2018-12-14 ENCOUNTER — Encounter: Payer: Self-pay | Admitting: Hematology and Oncology

## 2018-12-14 DIAGNOSIS — R634 Abnormal weight loss: Secondary | ICD-10-CM | POA: Diagnosis not present

## 2018-12-14 DIAGNOSIS — E870 Hyperosmolality and hypernatremia: Secondary | ICD-10-CM | POA: Diagnosis not present

## 2018-12-14 DIAGNOSIS — Z9221 Personal history of antineoplastic chemotherapy: Secondary | ICD-10-CM | POA: Diagnosis not present

## 2018-12-14 DIAGNOSIS — Z66 Do not resuscitate: Secondary | ICD-10-CM

## 2018-12-14 DIAGNOSIS — C9001 Multiple myeloma in remission: Secondary | ICD-10-CM

## 2018-12-14 DIAGNOSIS — G893 Neoplasm related pain (acute) (chronic): Secondary | ICD-10-CM | POA: Diagnosis not present

## 2018-12-14 DIAGNOSIS — C9002 Multiple myeloma in relapse: Secondary | ICD-10-CM

## 2018-12-14 DIAGNOSIS — F0390 Unspecified dementia without behavioral disturbance: Secondary | ICD-10-CM

## 2018-12-14 DIAGNOSIS — Z79899 Other long term (current) drug therapy: Secondary | ICD-10-CM

## 2018-12-14 DIAGNOSIS — R630 Anorexia: Secondary | ICD-10-CM | POA: Diagnosis not present

## 2018-12-14 DIAGNOSIS — Z7982 Long term (current) use of aspirin: Secondary | ICD-10-CM

## 2018-12-14 DIAGNOSIS — D61818 Other pancytopenia: Secondary | ICD-10-CM

## 2018-12-14 DIAGNOSIS — Z7189 Other specified counseling: Secondary | ICD-10-CM

## 2018-12-14 DIAGNOSIS — Z5111 Encounter for antineoplastic chemotherapy: Secondary | ICD-10-CM | POA: Diagnosis not present

## 2018-12-14 DIAGNOSIS — M549 Dorsalgia, unspecified: Secondary | ICD-10-CM | POA: Diagnosis not present

## 2018-12-14 LAB — CBC WITH DIFFERENTIAL/PLATELET
Abs Immature Granulocytes: 0.03 10*3/uL (ref 0.00–0.07)
BASOS ABS: 0.1 10*3/uL (ref 0.0–0.1)
Basophils Relative: 1 %
Eosinophils Absolute: 0.2 10*3/uL (ref 0.0–0.5)
Eosinophils Relative: 5 %
HCT: 40.2 % (ref 36.0–46.0)
Hemoglobin: 12.6 g/dL (ref 12.0–15.0)
Immature Granulocytes: 1 %
Lymphocytes Relative: 47 %
Lymphs Abs: 1.7 10*3/uL (ref 0.7–4.0)
MCH: 32.6 pg (ref 26.0–34.0)
MCHC: 31.3 g/dL (ref 30.0–36.0)
MCV: 104.1 fL — ABNORMAL HIGH (ref 80.0–100.0)
Monocytes Absolute: 0.2 10*3/uL (ref 0.1–1.0)
Monocytes Relative: 7 %
Neutro Abs: 1.4 10*3/uL — ABNORMAL LOW (ref 1.7–7.7)
Neutrophils Relative %: 39 %
PLATELETS: 80 10*3/uL — AB (ref 150–400)
RBC: 3.86 MIL/uL — ABNORMAL LOW (ref 3.87–5.11)
RDW: 15.9 % — AB (ref 11.5–15.5)
WBC: 3.6 10*3/uL — ABNORMAL LOW (ref 4.0–10.5)
nRBC: 0 % (ref 0.0–0.2)

## 2018-12-14 LAB — COMPREHENSIVE METABOLIC PANEL
ALT: 14 U/L (ref 0–44)
AST: 15 U/L (ref 15–41)
Albumin: 3.5 g/dL (ref 3.5–5.0)
Alkaline Phosphatase: 66 U/L (ref 38–126)
Anion gap: 12 (ref 5–15)
BUN: 13 mg/dL (ref 8–23)
CHLORIDE: 113 mmol/L — AB (ref 98–111)
CO2: 22 mmol/L (ref 22–32)
Calcium: 8.6 mg/dL — ABNORMAL LOW (ref 8.9–10.3)
Creatinine, Ser: 0.86 mg/dL (ref 0.44–1.00)
GFR calc Af Amer: >60 mL/min (ref >60)
Glucose, Bld: 90 mg/dL (ref 70–99)
Potassium: 3.5 mmol/L (ref 3.5–5.1)
Sodium: 147 mmol/L — ABNORMAL HIGH (ref 135–145)
Total Bilirubin: 0.6 mg/dL (ref 0.3–1.2)
Total Protein: 6.2 g/dL — ABNORMAL LOW (ref 6.5–8.1)

## 2018-12-14 MED ORDER — POMALIDOMIDE 2 MG PO CAPS: 2.0000 mg | ORAL_CAPSULE | Freq: Every day | ORAL | 11 refills | Status: DC

## 2018-12-14 NOTE — Progress Notes (Signed)
[place] OFFICE PROGRESS NOTE  Patient Care Team: Susy Frizzle, MD as PCP - General (Family Medicine)  ASSESSMENT & PLAN:  Multiple myeloma in relapse [facility]) Unfortunately, the patient has signs of cancer progression With the dementia, she is not competent to make medical decision Her sister, Teressa Senter is her healthcare power of attorney We discussed palliative care only versus further chemotherapy Ultimately, her sister felt that the patient should try different chemotherapy We will discontinue Velcade and Revlimid I will get her prescription of pomalidomide authorized She will increase her baseline dexamethasone to 2 mg daily until she gets a new prescription Given her age and borderline kidney function, I plan to start her on reduced dose pomalidomide at 2 mg daily, to take for 21 days, rest 7 days She will continue aspirin therapy for DVT prophylaxis, along with acyclovir for antimicrobial prophylaxis I will see her back in 2 weeks for further follow-up The role of treatment is strictly palliative in nature  Acquired pancytopenia [facility]) She has mild pancytopenia due to her underlying bone marrow disease and chemotherapy As above, I will plan to switch her treatment to pomalidomide. There is no contraindication to remain on antiplatelet agents or anticoagulants as long as the platelet is greater than 50,000.    Cancer associated pain She has chronic back pain from her disease I recommend MS Contin twice a day along with oxycodone as needed for breakthrough pain  Goals of care, counseling/discussion We had numerous goals of care discussion over the past few years Due to her dementia, her sisters are her dedicated healthcare power of attorney She has advanced directives Her current CODE STATUS is DNR Her sister would like her to continue palliative treatment and is not ready for palliative care/hospice   No orders of the defined types were placed in this  encounter.   INTERVAL HISTORY: Please see below for problem oriented charting. She returns for further therapy with her sisters There were no reported recent infection or falls The patient has progressive weight loss She denies worsening pain There were no reported recent bleeding No recent confusion episodes  SUMMARY OF ONCOLOGIC HISTORY:   Multiple myeloma in relapse ([place])   01/09/2016 Imaging    MRI back showed Interval development of diffuse bone marrow infiltration by innumerable small lesions most consistent with multiple myeloma    01/23/2016 Bone Marrow Biopsy    Accession: [id number] Bone marrow biopsy showed 51% plasma cells    01/23/2016 Imaging    Skeletal survey showed lytic lesions    01/23/2016 Pathology Results    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 Revlimid was stopped in July and resumed in October, stopped due to disease progression    04/01/2016 Adverse Reaction    Treatment was placed on hold temporarily due to neutropenia     REVIEW OF SYSTEMS:   Constitutional: Denies fevers, chills or abnormal weight loss Eyes: Denies blurriness of vision Ears, nose, mouth, throat, and face: Denies mucositis or sore throat Respiratory: Denies cough, dyspnea or wheezes Cardiovascular: Denies palpitation, chest discomfort or lower extremity swelling Gastrointestinal:  Denies nausea, heartburn or change in bowel habits Skin: Denies abnormal skin rashes Lymphatics: Denies new lymphadenopathy or easy bruising Neurological:Denies numbness, tingling or new weaknesses Behavioral/Psych: Mood is stable, no new changes  All other systems were reviewed with the patient and are negative.  I have reviewed the  past medical history, past surgical history, social history and family history with the patient and they are unchanged from previous note.  ALLERGIES:  is allergic to  pravastatin and zocor [simvastatin].  MEDICATIONS:  Current Outpatient Medications  Medication Sig Dispense Refill  . acyclovir (ZOVIRAX) 400 MG tablet Take 1 tablet (400 mg total) by mouth daily. 90 tablet 11  . aspirin EC 81 MG tablet Take 81 mg by mouth daily.    . bortezomib IV (VELCADE) 3.5 MG injection Inject 1.3 mg/m2 into the vein once. Once every other week    . Cholecalciferol (VITAMIN D PO) Take by mouth.    . dexamethasone (DECADRON) 1 MG tablet TAKE 1 TABLET BY MOUTH ONCE DAILY 90 tablet 1  . donepezil (ARICEPT) 10 MG tablet Take 1/2 tablet daily for 2 weeks, then increase to 1 tablet daily 30 tablet 11  . lisinopril (PRINIVIL,ZESTRIL) 20 MG tablet Take 1 tablet (20 mg total) by mouth daily. 90 tablet 11  . loperamide (IMODIUM) 2 MG capsule Take 1 capsule (2 mg total) by mouth 4 (four) times daily as needed for diarrhea or loose stools. 12 capsule 0  . morphine (MS CONTIN) 15 MG 12 hr tablet Take 1 tablet (15 mg total) by mouth every 12 (twelve) hours. 60 tablet 0  . oxyCODONE-acetaminophen (ROXICET) 5-325 MG tablet Take 1 tablet by mouth every 6 (six) hours as needed for severe pain. 90 tablet 0  . pomalidomide (POMALYST) 2 MG capsule Take 1 capsule (2 mg total) by mouth daily. Take with water on days 1-21. Repeat every 28 days. 21 capsule 11   No current facility-administered medications for this visit.     PHYSICAL EXAMINATION: ECOG PERFORMANCE STATUS: 2 - Symptomatic, <50% confined to bed  Vitals:   12/14/18 0833  BP: 112/62  Pulse: 80  Resp: 18  Temp: 97.8 F (36.6 C)  SpO2: 100%   Filed Weights   12/14/18 0833  Weight: 132 lb 9.6 oz (60.1 kg)    GENERAL:alert, no distress and comfortable Musculoskeletal:no cyanosis of digits and no clubbing  NEURO: alert & oriented x 3 with fluent speech, no focal motor/sensory deficits  LABORATORY DATA:  I have reviewed the data as listed    Component Value Date/Time   NA 147 (H) 12/14/2018 0819   NA 145 11/03/2017  0930   K 3.5 12/14/2018 0819   K 3.6 11/03/2017 0930   CL 113 (H) 12/14/2018 0819   CO2 22 12/14/2018 0819   CO2 27 11/03/2017 0930   GLUCOSE 90 12/14/2018 0819   GLUCOSE 112 11/03/2017 0930   BUN 13 12/14/2018 0819   BUN 11 12/08/2017 1551   BUN 13.4 11/03/2017 0930   CREATININE 0.86 12/14/2018 0819   CREATININE 0.71 07/06/2018 0912   CREATININE 0.9 11/03/2017 0930   CALCIUM 8.6 (L) 12/14/2018 0819   CALCIUM 9.4 11/03/2017 0930   PROT 6.2 (L) 12/14/2018 0819   PROT 6.2 11/03/2017 0930   PROT 6.9 11/03/2017 0930   ALBUMIN 3.5 12/14/2018 0819   ALBUMIN 3.2 (L) 11/03/2017 0930   AST 15 12/14/2018 0819   AST 19 07/06/2018 0912   AST 20 11/03/2017 0930   ALT 14 12/14/2018 0819   ALT 17 07/06/2018 0912   ALT 18 11/03/2017 0930   ALKPHOS 66 12/14/2018 0819   ALKPHOS 84 11/03/2017 0930   BILITOT 0.6 12/14/2018 0819   BILITOT 0.7 07/06/2018 0912   BILITOT 0.64 11/03/2017 0930   GFRNONAA >60 12/14/2018 0819   GFRNONAA >60  07/06/2018 0912   GFRNONAA 64 01/17/2016 0910   GFRAA >60 12/14/2018 0819   GFRAA >60 07/06/2018 0912   GFRAA 74 01/17/2016 0910    No results found for: SPEP, UPEP  Lab Results  Component Value Date   WBC 3.6 (L) 12/14/2018   NEUTROABS 1.4 (L) 12/14/2018   HGB 12.6 12/14/2018   HCT 40.2 12/14/2018   MCV 104.1 (H) 12/14/2018   PLT 80 (L) 12/14/2018      Chemistry      Component Value Date/Time   NA 147 (H) 12/14/2018 0819   NA 145 11/03/2017 0930   K 3.5 12/14/2018 0819   K 3.6 11/03/2017 0930   CL 113 (H) 12/14/2018 0819   CO2 22 12/14/2018 0819   CO2 27 11/03/2017 0930   BUN 13 12/14/2018 0819   BUN 11 12/08/2017 1551   BUN 13.4 11/03/2017 0930   CREATININE 0.86 12/14/2018 0819   CREATININE 0.71 07/06/2018 0912   CREATININE 0.9 11/03/2017 0930      Component Value Date/Time   CALCIUM 8.6 (L) 12/14/2018 0819   CALCIUM 9.4 11/03/2017 0930   ALKPHOS 66 12/14/2018 0819   ALKPHOS 84 11/03/2017 0930   AST 15 12/14/2018 0819   AST 19  07/06/2018 0912   AST 20 11/03/2017 0930   ALT 14 12/14/2018 0819   ALT 17 07/06/2018 0912   ALT 18 11/03/2017 0930   BILITOT 0.6 12/14/2018 0819   BILITOT 0.7 07/06/2018 0912   BILITOT 0.64 11/03/2017 0930       All questions were answered. The patient knows to call the clinic with any problems, questions or concerns. No barriers to learning was detected.  I spent 30 minutes counseling the patient face to face. The total time spent in the appointment was 40 minutes and more than 50% was on counseling and review of test results  Heath Lark, MD 12/14/2018 9:32 AM

## 2018-12-14 NOTE — Assessment & Plan Note (Signed)
She has mild pancytopenia due to her underlying bone marrow disease and chemotherapy As above, I will plan to switch her treatment to pomalidomide. There is no contraindication to remain on antiplatelet agents or anticoagulants as long as the platelet is greater than 50,000.

## 2018-12-14 NOTE — Assessment & Plan Note (Signed)
Unfortunately, the patient has signs of cancer progression With the dementia, she is not competent to make medical decision Her sister, Teressa Senter is her healthcare power of attorney We discussed palliative care only versus further chemotherapy Ultimately, her sister felt that the patient should try different chemotherapy We will discontinue Velcade and Revlimid I will get her prescription of pomalidomide authorized She will increase her baseline dexamethasone to 2 mg daily until she gets a new prescription Given her age and borderline kidney function, I plan to start her on reduced dose pomalidomide at 2 mg daily, to take for 21 days, rest 7 days She will continue aspirin therapy for DVT prophylaxis, along with acyclovir for antimicrobial prophylaxis I will see her back in 2 weeks for further follow-up The role of treatment is strictly palliative in nature

## 2018-12-14 NOTE — Assessment & Plan Note (Signed)
We had numerous goals of care discussion over the past few years Due to her dementia, her sisters are her dedicated healthcare power of attorney She has advanced directives Her current CODE STATUS is DNR Her sister would like her to continue palliative treatment and is not ready for palliative care/hospice

## 2018-12-14 NOTE — Assessment & Plan Note (Signed)
She has chronic back pain from her disease I recommend MS Contin twice a day along with oxycodone as needed for breakthrough pain 

## 2018-12-15 ENCOUNTER — Telehealth: Payer: Self-pay | Admitting: Pharmacist

## 2018-12-15 ENCOUNTER — Telehealth: Payer: Self-pay

## 2018-12-15 DIAGNOSIS — C9002 Multiple myeloma in relapse: Secondary | ICD-10-CM

## 2018-12-15 NOTE — Telephone Encounter (Signed)
Oral Oncology Patient Advocate Encounter  Received notification from [facility] that prior authorization for Pomalyst is required.  PA submitted on CoverMyMeds Key [id number] Status is pending  Oral Oncology Clinic will continue to follow.  Kimberly Friedman Patient [place] Phone [phone] Fax [phone]

## 2018-12-15 NOTE — Telephone Encounter (Signed)
Oral Oncology Pharmacist Encounter  Received new prescription for Pomalyst (pomalidomide) for the treatment of multiple myeloma in relapse, while receivng therapy with Velcade and Revlimid, in conjunction with dexamethasone, planned duration until disease progression or unacceptable toxicity.  Labs from 12/14/2018 assessed, OK for treatment. Noted platelet=80k, and ANC=1.4, will be monitored closely while on treatment  Pomalyst is planned to be administered at 2mg by mouth once daily for 21 days on, 7 days off, repeated every 28 days  Current medication list in Epic reviewed, no significant DDIs with Pomalyst identified.  Prescription will be e-scribed to [facility] specialty pharmacy (this is where patient has been receiving Revlimid) once insurance authorization is approved. Pomalyst is a limited distribution medication and is not available to be dispensed at [facility] outpatient pharmacy.  Oral Oncology Clinic will continue to follow for insurance authorization, copayment issues, initial counseling and start date.  Jesse , PharmD, BCPS, BCOP  12/15/2018 2:32 PM Oral Oncology Clinic [phone]  

## 2018-12-16 DIAGNOSIS — C9002 Multiple myeloma in relapse: Secondary | ICD-10-CM | POA: Diagnosis not present

## 2018-12-16 MED ORDER — POMALIDOMIDE 2 MG PO CAPS: 2.0000 mg | ORAL_CAPSULE | Freq: Every day | ORAL | 0 refills | Status: DC

## 2018-12-16 NOTE — Telephone Encounter (Signed)
Oral Chemotherapy Pharmacist Encounter   I spoke with patient's sister, Kimberly Friedman, for overview of: Pomalyst (pomalidomide) for the treatment of multiple myeloma in relapse, while receivng therapy with Velcade and Revlimid, in conjunction with dexamethasone, planned duration until disease progression or unacceptable toxicity.   Counseled patient on administration, dosing, side effects, monitoring, drug-food interactions, safe handling, storage, and disposal.  Patient will take Pomalyst 25m capsules, 1 capsule by mouth once daily, without regard to food, with a full glass of water.  Pomalyst will be given 21 days on, 7 days off, repeat every 28 days.  Patient will remain on dexamethasone 2 mg once daily until seen back in the office on 12/28/2018. Patient will receive new prescription for dexamethasone from Dr. GAlvy Bimlerat that time to reflect once weekly dosing.  Pomalyst start date: 12/27/2018  Adverse effects of Pomalyst include but are not limited to: nausea, constipation, diarrhea, abdominal pain, rash, fatigue, drug fever, peripheral edema, and decreased blood counts.    Reviewed importance of keeping a medication schedule and plan for any missed doses.  Kimberly Friedman voiced understanding and appreciation.   All questions answered. Medication reconciliation performed and medication/allergy list updated.  Patient remains on acyclovir 4032monce daily for VZV prophylaxis. Patient remains on daily aspirin 8163mor VTE prophylaxis.  Patient sister informed that insurance authorization for Pomalyst has been approved. Pomalyst prescription has been E scribed to [name] this is where patient was receiving her Revlimid. Patient sister informed that she will likely be hearing from dispensing pharmacy the early part of next week to schedule for shipment of Pomalyst.  Patient knows to call the office with questions or concerns. Oral Oncology Clinic will continue to follow.  JesJohny DrillingharmD, BCPS, BCOP  12/16/2018   12:22 PM Oral Oncology Clinic [phone]

## 2018-12-17 DIAGNOSIS — C9002 Multiple myeloma in relapse: Secondary | ICD-10-CM | POA: Diagnosis not present

## 2018-12-17 NOTE — Telephone Encounter (Signed)
Oral Oncology Patient Advocate Encounter  Prior Authorization for Pomalyst has been approved.    PA# [id number] Effective dates: 12/15/18 through 06/15/19  Oral Oncology Clinic will continue to follow.   [facility] Patient [place] Phone [phone] Fax [phone]

## 2018-12-18 DIAGNOSIS — C9002 Multiple myeloma in relapse: Secondary | ICD-10-CM | POA: Diagnosis not present

## 2018-12-19 DIAGNOSIS — C9002 Multiple myeloma in relapse: Secondary | ICD-10-CM | POA: Diagnosis not present

## 2018-12-20 DIAGNOSIS — C9002 Multiple myeloma in relapse: Secondary | ICD-10-CM | POA: Diagnosis not present

## 2018-12-21 DIAGNOSIS — C9002 Multiple myeloma in relapse: Secondary | ICD-10-CM | POA: Diagnosis not present

## 2018-12-22 DIAGNOSIS — C9002 Multiple myeloma in relapse: Secondary | ICD-10-CM | POA: Diagnosis not present

## 2018-12-23 DIAGNOSIS — C9002 Multiple myeloma in relapse: Secondary | ICD-10-CM | POA: Diagnosis not present

## 2018-12-24 DIAGNOSIS — C9002 Multiple myeloma in relapse: Secondary | ICD-10-CM | POA: Diagnosis not present

## 2018-12-25 DIAGNOSIS — C9002 Multiple myeloma in relapse: Secondary | ICD-10-CM | POA: Diagnosis not present

## 2018-12-26 DIAGNOSIS — C9002 Multiple myeloma in relapse: Secondary | ICD-10-CM | POA: Diagnosis not present

## 2018-12-27 DIAGNOSIS — C9002 Multiple myeloma in relapse: Secondary | ICD-10-CM | POA: Diagnosis not present

## 2018-12-27 NOTE — Telephone Encounter (Signed)
Oral Oncology Patient Advocate Encounter  Confirmed with [facility] specialty pharmacy that Pomalyst was delivered to the patient on 12/23/18 with a $3.64 copay.  [place] Patient [place] Phone [phone] Fax [phone]

## 2018-12-28 ENCOUNTER — Inpatient Hospital Stay (HOSPITAL_BASED_OUTPATIENT_CLINIC_OR_DEPARTMENT_OTHER): Payer: Medicare HMO | Admitting: Hematology and Oncology

## 2018-12-28 ENCOUNTER — Inpatient Hospital Stay: Payer: Medicare HMO | Attending: Hematology and Oncology

## 2018-12-28 ENCOUNTER — Telehealth: Payer: Self-pay | Admitting: Hematology and Oncology

## 2018-12-28 ENCOUNTER — Ambulatory Visit: Payer: Medicare HMO

## 2018-12-28 ENCOUNTER — Encounter: Payer: Self-pay | Admitting: Hematology and Oncology

## 2018-12-28 DIAGNOSIS — G893 Neoplasm related pain (acute) (chronic): Secondary | ICD-10-CM | POA: Diagnosis not present

## 2018-12-28 DIAGNOSIS — Z79899 Other long term (current) drug therapy: Secondary | ICD-10-CM | POA: Diagnosis not present

## 2018-12-28 DIAGNOSIS — M549 Dorsalgia, unspecified: Secondary | ICD-10-CM | POA: Diagnosis not present

## 2018-12-28 DIAGNOSIS — F5104 Psychophysiologic insomnia: Secondary | ICD-10-CM | POA: Diagnosis not present

## 2018-12-28 DIAGNOSIS — D61818 Other pancytopenia: Secondary | ICD-10-CM | POA: Diagnosis not present

## 2018-12-28 DIAGNOSIS — C9002 Multiple myeloma in relapse: Secondary | ICD-10-CM | POA: Diagnosis not present

## 2018-12-28 DIAGNOSIS — C9001 Multiple myeloma in remission: Secondary | ICD-10-CM

## 2018-12-28 DIAGNOSIS — Z9221 Personal history of antineoplastic chemotherapy: Secondary | ICD-10-CM | POA: Insufficient documentation

## 2018-12-28 DIAGNOSIS — Z7982 Long term (current) use of aspirin: Secondary | ICD-10-CM

## 2018-12-28 DIAGNOSIS — C9000 Multiple myeloma not having achieved remission: Secondary | ICD-10-CM

## 2018-12-28 LAB — CBC WITH DIFFERENTIAL/PLATELET
Abs Immature Granulocytes: 0.05 10*3/uL (ref 0.00–0.07)
Basophils Absolute: 0 10*3/uL (ref 0.0–0.1)
Basophils Relative: 1 %
Eosinophils Absolute: 0 10*3/uL (ref 0.0–0.5)
Eosinophils Relative: 2 %
HCT: 40.4 % (ref 36.0–46.0)
Hemoglobin: 12.8 g/dL (ref 12.0–15.0)
IMMATURE GRANULOCYTES: 2 %
LYMPHS PCT: 45 %
Lymphs Abs: 1.2 10*3/uL (ref 0.7–4.0)
MCH: 32.3 pg (ref 26.0–34.0)
MCHC: 31.7 g/dL (ref 30.0–36.0)
MCV: 102 fL — ABNORMAL HIGH (ref 80.0–100.0)
Monocytes Absolute: 0.3 10*3/uL (ref 0.1–1.0)
Monocytes Relative: 11 %
NRBC: 0 % (ref 0.0–0.2)
Neutro Abs: 1 10*3/uL — ABNORMAL LOW (ref 1.7–7.7)
Neutrophils Relative %: 39 %
Platelets: 117 10*3/uL — ABNORMAL LOW (ref 150–400)
RBC: 3.96 MIL/uL (ref 3.87–5.11)
RDW: 16.1 % — ABNORMAL HIGH (ref 11.5–15.5)
WBC: 2.7 10*3/uL — ABNORMAL LOW (ref 4.0–10.5)

## 2018-12-28 LAB — COMPREHENSIVE METABOLIC PANEL
ALBUMIN: 3.7 g/dL (ref 3.5–5.0)
ALT: 17 U/L (ref 0–44)
AST: 19 U/L (ref 15–41)
Alkaline Phosphatase: 63 U/L (ref 38–126)
Anion gap: 11 (ref 5–15)
BUN: 10 mg/dL (ref 8–23)
CO2: 29 mmol/L (ref 22–32)
Calcium: 9.2 mg/dL (ref 8.9–10.3)
Chloride: 104 mmol/L (ref 98–111)
Creatinine, Ser: 0.8 mg/dL (ref 0.44–1.00)
GFR calc Af Amer: >60 mL/min (ref >60)
GFR calc non Af Amer: >60 mL/min (ref >60)
GLUCOSE: 86 mg/dL (ref 70–99)
Potassium: 4 mmol/L (ref 3.5–5.1)
SODIUM: 144 mmol/L (ref 135–145)
Total Bilirubin: 0.8 mg/dL (ref 0.3–1.2)
Total Protein: 6.3 g/dL — ABNORMAL LOW (ref 6.5–8.1)

## 2018-12-28 MED ORDER — ACYCLOVIR 400 MG PO TABS: 400.0000 mg | ORAL_TABLET | Freq: Every day | ORAL | 11 refills | Status: DC

## 2018-12-28 NOTE — Assessment & Plan Note (Signed)
She has profound pancytopenia due to treatment I recommend holding treatment for 1 week and recheck next week.  She will continue aspirin therapy in the meantime

## 2018-12-28 NOTE — Assessment & Plan Note (Signed)
She has chronic back pain from her disease I recommend MS Contin twice a day along with oxycodone as needed for breakthrough pain 

## 2018-12-28 NOTE — Telephone Encounter (Signed)
Gave avs and calendar ° °

## 2018-12-28 NOTE — Progress Notes (Signed)
[place] OFFICE PROGRESS NOTE  Patient Care Team: Susy Frizzle, MD as PCP - General (Family Medicine)  ASSESSMENT & PLAN:  Multiple myeloma in relapse [facility]) So far, she tolerated treatment well except for severe pancytopenia Due to her age, I recommend holding Pomalyst for week.  I will see her every week to see if she can tolerate reduced dose of Pomalyst well.  She will reduce oral dexamethasone to 1 mg daily. She will continue calcium with vitamin D along with acyclovir for antimicrobial prophylaxis She will continue aspirin for DVT prophylaxis  Acquired pancytopenia ([place]) She has profound pancytopenia due to treatment I recommend holding treatment for 1 week and recheck next week.  She will continue aspirin therapy in the meantime  Cancer associated pain She has chronic back pain from her disease I recommend MS Contin twice a day along with oxycodone as needed for breakthrough pain   No orders of the defined types were placed in this encounter.   INTERVAL HISTORY: Please see below for problem oriented charting. She returns with her 2 sisters for further follow-up She started taking Pomalyst last week There were no reported recent fever or chills No recent infection No recent falls Her chronic pain is stable.  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 Revlimid was stopped in July and resumed in October, stopped due to disease  progression    04/01/2016 Adverse Reaction    Treatment was placed on hold temporarily due to neutropenia    12/25/2018 -  Chemotherapy    The patient had Pomalyst and dexamethasone     REVIEW OF SYSTEMS:   Constitutional: Denies fevers, chills or abnormal weight loss Eyes: Denies blurriness of vision Ears, nose, mouth, throat, and face: Denies mucositis or sore throat Respiratory: Denies cough, dyspnea or wheezes Cardiovascular: Denies palpitation, chest discomfort or lower extremity swelling Gastrointestinal:  Denies nausea, heartburn or change in bowel habits Skin: Denies abnormal skin rashes Lymphatics: Denies new lymphadenopathy or easy bruising Neurological:Denies numbness, tingling or new weaknesses Behavioral/Psych: Mood is stable, no new changes  All other systems were reviewed with the patient and are negative.  I have reviewed the past medical history, past surgical history, social history and family history with the patient and they are unchanged from previous note.  ALLERGIES:  is allergic to pravastatin and zocor [simvastatin].  MEDICATIONS:  Current Outpatient Medications  Medication Sig Dispense Refill  . acyclovir (ZOVIRAX) 400 MG tablet Take 1 tablet (400 mg total) by mouth daily. 90 tablet 11  . aspirin EC 81 MG tablet Take 81 mg by mouth daily.    . Cholecalciferol (VITAMIN D PO) Take by mouth.    . dexamethasone (DECADRON) 1 MG tablet TAKE 1 TABLET BY MOUTH ONCE DAILY 90 tablet 1  . donepezil (ARICEPT) 10 MG tablet Take 1/2 tablet daily for 2 weeks, then increase to 1 tablet daily 30 tablet 11  . lisinopril (PRINIVIL,ZESTRIL) 20  MG tablet Take 1 tablet (20 mg total) by mouth daily. 90 tablet 11  . loperamide (IMODIUM) 2 MG capsule Take 1 capsule (2 mg total) by mouth 4 (four) times daily as needed for diarrhea or loose stools. 12 capsule 0  . morphine (MS CONTIN) 15 MG 12 hr tablet Take 1 tablet (15 mg total) by mouth every 12 (twelve) hours. 60 tablet 0  .  oxyCODONE-acetaminophen (ROXICET) 5-325 MG tablet Take 1 tablet by mouth every 6 (six) hours as needed for severe pain. 90 tablet 0  . pomalidomide (POMALYST) 2 MG capsule Take 1 capsule (2 mg total) by mouth daily. Take with water on days 1-21. Repeat every 28 days. 21 capsule 0   No current facility-administered medications for this visit.     PHYSICAL EXAMINATION: ECOG PERFORMANCE STATUS: 1 - Symptomatic but completely ambulatory  Vitals:   12/28/18 0910  BP: 136/65  Pulse: 70  Resp: 17  Temp: 98.1 F (36.7 C)  SpO2: 97%   Filed Weights   12/28/18 0910  Weight: 131 lb 12.8 oz (59.8 kg)    GENERAL:alert, no distress and comfortable SKIN: skin color, texture, turgor are normal, no rashes or significant lesions EYES: normal, Conjunctiva are pink and non-injected, sclera clear OROPHARYNX:no exudate, no erythema and lips, buccal mucosa, and tongue normal  NECK: supple, thyroid normal size, non-tender, without nodularity LYMPH:  no palpable lymphadenopathy in the cervical, axillary or inguinal LUNGS: clear to auscultation and percussion with normal breathing effort HEART: regular rate & rhythm and no murmurs and no lower extremity edema ABDOMEN:abdomen soft, non-tender and normal bowel sounds Musculoskeletal:no cyanosis of digits and no clubbing  NEURO: alert & oriented x 3 with fluent speech, no focal motor/sensory deficits  LABORATORY DATA:  I have reviewed the data as listed    Component Value Date/Time   NA 144 12/28/2018 0842   NA 145 11/03/2017 0930   K 4.0 12/28/2018 0842   K 3.6 11/03/2017 0930   CL 104 12/28/2018 0842   CO2 29 12/28/2018 0842   CO2 27 11/03/2017 0930   GLUCOSE 86 12/28/2018 0842   GLUCOSE 112 11/03/2017 0930   BUN 10 12/28/2018 0842   BUN 11 12/08/2017 1551   BUN 13.4 11/03/2017 0930   CREATININE 0.80 12/28/2018 0842   CREATININE 0.71 07/06/2018 0912   CREATININE 0.9 11/03/2017 0930   CALCIUM 9.2 12/28/2018 0842   CALCIUM 9.4 11/03/2017  0930   PROT 6.3 (L) 12/28/2018 0842   PROT 6.2 11/03/2017 0930   PROT 6.9 11/03/2017 0930   ALBUMIN 3.7 12/28/2018 0842   ALBUMIN 3.2 (L) 11/03/2017 0930   AST 19 12/28/2018 0842   AST 19 07/06/2018 0912   AST 20 11/03/2017 0930   ALT 17 12/28/2018 0842   ALT 17 07/06/2018 0912   ALT 18 11/03/2017 0930   ALKPHOS 63 12/28/2018 0842   ALKPHOS 84 11/03/2017 0930   BILITOT 0.8 12/28/2018 0842   BILITOT 0.7 07/06/2018 0912   BILITOT 0.64 11/03/2017 0930   GFRNONAA >60 12/28/2018 0842   GFRNONAA >60 07/06/2018 0912   GFRNONAA 64 01/17/2016 0910   GFRAA >60 12/28/2018 0842   GFRAA >60 07/06/2018 0912   GFRAA 74 01/17/2016 0910    No results found for: SPEP, UPEP  Lab Results  Component Value Date   WBC 2.7 (L) 12/28/2018   NEUTROABS 1.0 (L) 12/28/2018   HGB 12.8 12/28/2018   HCT 40.4 12/28/2018   MCV 102.0 (H) 12/28/2018   PLT 117 (L)  12/28/2018      Chemistry      Component Value Date/Time   NA 144 12/28/2018 0842   NA 145 11/03/2017 0930   K 4.0 12/28/2018 0842   K 3.6 11/03/2017 0930   CL 104 12/28/2018 0842   CO2 29 12/28/2018 0842   CO2 27 11/03/2017 0930   BUN 10 12/28/2018 0842   BUN 11 12/08/2017 1551   BUN 13.4 11/03/2017 0930   CREATININE 0.80 12/28/2018 0842   CREATININE 0.71 07/06/2018 0912   CREATININE 0.9 11/03/2017 0930      Component Value Date/Time   CALCIUM 9.2 12/28/2018 0842   CALCIUM 9.4 11/03/2017 0930   ALKPHOS 63 12/28/2018 0842   ALKPHOS 84 11/03/2017 0930   AST 19 12/28/2018 0842   AST 19 07/06/2018 0912   AST 20 11/03/2017 0930   ALT 17 12/28/2018 0842   ALT 17 07/06/2018 0912   ALT 18 11/03/2017 0930   BILITOT 0.8 12/28/2018 0842   BILITOT 0.7 07/06/2018 0912   BILITOT 0.64 11/03/2017 0930       All questions were answered. The patient knows to call the clinic with any problems, questions or concerns. No barriers to learning was detected.  I spent 15 minutes counseling the patient face to face. The total time spent in the  appointment was 20 minutes and more than 50% was on counseling and review of test results  Heath Lark, MD 12/28/2018 3:34 PM

## 2018-12-28 NOTE — Assessment & Plan Note (Signed)
So far, she tolerated treatment well except for severe pancytopenia Due to her age, I recommend holding Pomalyst for week.  I will see her every week to see if she can tolerate reduced dose of Pomalyst well.  She will reduce oral dexamethasone to 1 mg daily. She will continue calcium with vitamin D along with acyclovir for antimicrobial prophylaxis She will continue aspirin for DVT prophylaxis

## 2018-12-29 DIAGNOSIS — C9002 Multiple myeloma in relapse: Secondary | ICD-10-CM | POA: Diagnosis not present

## 2018-12-29 LAB — KAPPA/LAMBDA LIGHT CHAINS
KAPPA, LAMDA LIGHT CHAIN RATIO: 45 — AB (ref 0.26–1.65)
Kappa free light chain: 247.5 mg/L — ABNORMAL HIGH (ref 3.3–19.4)
Lambda free light chains: 5.5 mg/L — ABNORMAL LOW (ref 5.7–26.3)

## 2018-12-30 DIAGNOSIS — C9002 Multiple myeloma in relapse: Secondary | ICD-10-CM | POA: Diagnosis not present

## 2018-12-30 LAB — MULTIPLE MYELOMA PANEL, SERUM
ALBUMIN/GLOB SERPL: 1.6 (ref 0.7–1.7)
Albumin SerPl Elph-Mcnc: 3.6 g/dL (ref 2.9–4.4)
Alpha 1: 0.2 g/dL (ref 0.0–0.4)
Alpha2 Glob SerPl Elph-Mcnc: 0.7 g/dL (ref 0.4–1.0)
B-GLOBULIN SERPL ELPH-MCNC: 1.1 g/dL (ref 0.7–1.3)
Gamma Glob SerPl Elph-Mcnc: 0.3 g/dL — ABNORMAL LOW (ref 0.4–1.8)
Globulin, Total: 2.4 g/dL (ref 2.2–3.9)
IgA: 276 mg/dL (ref 64–422)
IgG (Immunoglobin G), Serum: 450 mg/dL — ABNORMAL LOW (ref 700–1600)
IgM (Immunoglobulin M), Srm: 6 mg/dL — ABNORMAL LOW (ref 26–217)
M Protein SerPl Elph-Mcnc: 0.3 g/dL — ABNORMAL HIGH
Total Protein ELP: 6 g/dL (ref 6.0–8.5)

## 2018-12-31 DIAGNOSIS — C9002 Multiple myeloma in relapse: Secondary | ICD-10-CM | POA: Diagnosis not present

## 2019-01-01 DIAGNOSIS — C9002 Multiple myeloma in relapse: Secondary | ICD-10-CM | POA: Diagnosis not present

## 2019-01-02 DIAGNOSIS — C9002 Multiple myeloma in relapse: Secondary | ICD-10-CM | POA: Diagnosis not present

## 2019-01-03 DIAGNOSIS — C9002 Multiple myeloma in relapse: Secondary | ICD-10-CM | POA: Diagnosis not present

## 2019-01-03 MED FILL — ACYCLOVIR 400 MG TABLET: 400 | 90 days supply | Qty: 90 | Fill #0

## 2019-01-04 ENCOUNTER — Inpatient Hospital Stay (HOSPITAL_BASED_OUTPATIENT_CLINIC_OR_DEPARTMENT_OTHER): Payer: Medicare HMO | Admitting: Hematology and Oncology

## 2019-01-04 ENCOUNTER — Encounter: Payer: Self-pay | Admitting: Hematology and Oncology

## 2019-01-04 ENCOUNTER — Inpatient Hospital Stay: Payer: Medicare HMO

## 2019-01-04 DIAGNOSIS — G4700 Insomnia, unspecified: Secondary | ICD-10-CM

## 2019-01-04 DIAGNOSIS — M549 Dorsalgia, unspecified: Secondary | ICD-10-CM | POA: Diagnosis not present

## 2019-01-04 DIAGNOSIS — F5104 Psychophysiologic insomnia: Secondary | ICD-10-CM | POA: Diagnosis not present

## 2019-01-04 DIAGNOSIS — Z79899 Other long term (current) drug therapy: Secondary | ICD-10-CM

## 2019-01-04 DIAGNOSIS — Z9221 Personal history of antineoplastic chemotherapy: Secondary | ICD-10-CM | POA: Diagnosis not present

## 2019-01-04 DIAGNOSIS — Z7982 Long term (current) use of aspirin: Secondary | ICD-10-CM | POA: Diagnosis not present

## 2019-01-04 DIAGNOSIS — C9001 Multiple myeloma in remission: Secondary | ICD-10-CM

## 2019-01-04 DIAGNOSIS — D61818 Other pancytopenia: Secondary | ICD-10-CM

## 2019-01-04 DIAGNOSIS — C9002 Multiple myeloma in relapse: Secondary | ICD-10-CM | POA: Diagnosis not present

## 2019-01-04 DIAGNOSIS — G893 Neoplasm related pain (acute) (chronic): Secondary | ICD-10-CM

## 2019-01-04 LAB — COMPREHENSIVE METABOLIC PANEL
ALT: 17 U/L (ref 0–44)
AST: 21 U/L (ref 15–41)
Albumin: 3.4 g/dL — ABNORMAL LOW (ref 3.5–5.0)
Alkaline Phosphatase: 59 U/L (ref 38–126)
Anion gap: 9 (ref 5–15)
BUN: 15 mg/dL (ref 8–23)
CO2: 27 mmol/L (ref 22–32)
CREATININE: 0.8 mg/dL (ref 0.44–1.00)
Calcium: 8.6 mg/dL — ABNORMAL LOW (ref 8.9–10.3)
Chloride: 110 mmol/L (ref 98–111)
GFR calc Af Amer: >60 mL/min (ref >60)
GFR calc non Af Amer: >60 mL/min (ref >60)
Glucose, Bld: 70 mg/dL (ref 70–99)
Potassium: 4.2 mmol/L (ref 3.5–5.1)
Sodium: 146 mmol/L — ABNORMAL HIGH (ref 135–145)
Total Bilirubin: 0.5 mg/dL (ref 0.3–1.2)
Total Protein: 5.9 g/dL — ABNORMAL LOW (ref 6.5–8.1)

## 2019-01-04 LAB — CBC WITH DIFFERENTIAL/PLATELET
Abs Immature Granulocytes: 0.01 10*3/uL (ref 0.00–0.07)
Basophils Absolute: 0.1 10*3/uL (ref 0.0–0.1)
Basophils Relative: 1 %
EOS ABS: 0 10*3/uL (ref 0.0–0.5)
EOS PCT: 1 %
HCT: 39 % (ref 36.0–46.0)
Hemoglobin: 12.5 g/dL (ref 12.0–15.0)
Immature Granulocytes: 0 %
Lymphocytes Relative: 50 %
Lymphs Abs: 1.9 10*3/uL (ref 0.7–4.0)
MCH: 32.5 pg (ref 26.0–34.0)
MCHC: 32.1 g/dL (ref 30.0–36.0)
MCV: 101.3 fL — ABNORMAL HIGH (ref 80.0–100.0)
Monocytes Absolute: 0.5 10*3/uL (ref 0.1–1.0)
Monocytes Relative: 13 %
Neutro Abs: 1.3 10*3/uL — ABNORMAL LOW (ref 1.7–7.7)
Neutrophils Relative %: 35 %
Platelets: 123 10*3/uL — ABNORMAL LOW (ref 150–400)
RBC: 3.85 MIL/uL — ABNORMAL LOW (ref 3.87–5.11)
RDW: 15.7 % — AB (ref 11.5–15.5)
WBC: 3.8 10*3/uL — AB (ref 4.0–10.5)
nRBC: 0 % (ref 0.0–0.2)

## 2019-01-04 NOTE — Assessment & Plan Note (Signed)
So far, she tolerated treatment well except for severe pancytopenia Her blood count is recovering well since we held her treatment by 1 week. She will resume Pomalyst today.  I will continue to see her every week for toxicity review due to her frail status She will continue daily dexamethasone 1 mg. She will continue calcium with vitamin D along with acyclovir for antimicrobial prophylaxis She will continue aspirin for DVT prophylaxis

## 2019-01-04 NOTE — Assessment & Plan Note (Signed)
She has chronic back pain from her disease I recommend MS Contin twice a day along with oxycodone as needed for breakthrough pain 

## 2019-01-04 NOTE — Assessment & Plan Note (Signed)
She is frail.  Even with reduced dose of Pomalyst, she has intermittent severe pancytopenia Her blood count has improved since we held her treatment for 1 week. She will resume treatment today and I will see her again next week

## 2019-01-04 NOTE — Assessment & Plan Note (Signed)
She has chronic insomnia. Her sister is wondering whether she would benefit from sedatives Due to history of recurrent falls, I think the risk of side effects outweighs the benefit I recommend conservative management with scented oil or melatonin only in this situation.

## 2019-01-04 NOTE — Progress Notes (Signed)
[place] OFFICE PROGRESS NOTE  Patient Care Team: Susy Frizzle, MD as PCP - General (Family Medicine)  ASSESSMENT & PLAN:  Multiple myeloma in relapse [facility]) So far, she tolerated treatment well except for severe pancytopenia Her blood count is recovering well since we held her treatment by 1 week. She will resume Pomalyst today.  I will continue to see her every week for toxicity review due to her frail status She will continue daily dexamethasone 1 mg. She will continue calcium with vitamin D along with acyclovir for antimicrobial prophylaxis She will continue aspirin for DVT prophylaxis  Acquired pancytopenia ([place]) She is frail.  Even with reduced dose of Pomalyst, she has intermittent severe pancytopenia Her blood count has improved since we held her treatment for 1 week. She will resume treatment today and I will see her again next week  Cancer associated pain She has chronic back pain from her disease I recommend MS Contin twice a day along with oxycodone as needed for breakthrough pain  Insomnia disorder She has chronic insomnia. Her sister is wondering whether she would benefit from sedatives Due to history of recurrent falls, I think the risk of side effects outweighs the benefit I recommend conservative management with scented oil or melatonin only in this situation.   No orders of the defined types were placed in this encounter.   INTERVAL HISTORY: Please see below for problem oriented charting. She returns with HER-2 sisters for further follow-up She has been complaining of back pain recently Her sister also noted some recent insomnia.  No recent fall Denies recent cough, chest pain or shortness of breath The patient denies any recent signs or symptoms of bleeding such as spontaneous epistaxis, hematuria or hematochezia.   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 Revlimid was stopped in July and resumed in October, stopped due to disease progression    04/01/2016 Adverse Reaction    Treatment was placed on hold temporarily due to neutropenia    12/25/2018 -  Chemotherapy    The patient had Pomalyst and dexamethasone     REVIEW OF SYSTEMS:   Constitutional: Denies fevers, chills or abnormal weight loss Eyes: Denies blurriness of vision Ears, nose, mouth, throat, and face: Denies mucositis or sore throat Respiratory: Denies cough, dyspnea or wheezes Cardiovascular: Denies palpitation, chest discomfort or lower extremity swelling Gastrointestinal:  Denies nausea, heartburn or change in bowel habits Skin: Denies abnormal skin rashes Lymphatics: Denies new lymphadenopathy Neurological:Denies numbness, tingling or new weaknesses Behavioral/Psych: Mood is stable, no new changes  All other systems were reviewed with the patient and are negative.  I have reviewed the past medical history, past surgical history, social history and family history with the patient and they are unchanged from previous note.  ALLERGIES:  is allergic to pravastatin and zocor [simvastatin].  MEDICATIONS:  Current Outpatient Medications  Medication Sig Dispense Refill  . acyclovir (ZOVIRAX) 400 MG tablet Take  1 tablet (400 mg total) by mouth daily. 90 tablet 11  . aspirin EC 81 MG tablet Take 81 mg by mouth daily.    . Cholecalciferol (VITAMIN D PO) Take by mouth.    . dexamethasone (DECADRON) 1 MG tablet TAKE 1 TABLET BY MOUTH  ONCE DAILY 90 tablet 1  . donepezil (ARICEPT) 10 MG tablet Take 1/2 tablet daily for 2 weeks, then increase to 1 tablet daily 30 tablet 11  . lisinopril (PRINIVIL,ZESTRIL) 20 MG tablet Take 1 tablet (20 mg total) by mouth daily. 90 tablet 11  . loperamide (IMODIUM) 2 MG capsule Take 1 capsule (2 mg total) by mouth 4 (four) times daily as needed for diarrhea or loose stools. 12 capsule 0  . morphine (MS CONTIN) 15 MG 12 hr tablet Take 1 tablet (15 mg total) by mouth every 12 (twelve) hours. 60 tablet 0  . oxyCODONE-acetaminophen (ROXICET) 5-325 MG tablet Take 1 tablet by mouth every 6 (six) hours as needed for severe pain. 90 tablet 0  . pomalidomide (POMALYST) 2 MG capsule Take 1 capsule (2 mg total) by mouth daily. Take with water on days 1-21. Repeat every 28 days. 21 capsule 0   No current facility-administered medications for this visit.     PHYSICAL EXAMINATION: ECOG PERFORMANCE STATUS: 2 - Symptomatic, <50% confined to bed  Vitals:   01/04/19 0841  BP: (!) 144/108  Pulse: 83  Resp: 18  Temp: 97.9 F (36.6 C)  SpO2: 100%   Filed Weights   01/04/19 0841  Weight: 130 lb 12.8 oz (59.3 kg)    GENERAL:alert, no distress and comfortable.  She has mild cushingoid features SKIN: skin color, texture, turgor are normal, no rashes or significant lesions Musculoskeletal:no cyanosis of digits and no clubbing  NEURO: alert & oriented x 3 with fluent speech, no focal motor/sensory deficits  LABORATORY DATA:  I have reviewed the data as listed    Component Value Date/Time   NA 146 (H) 01/04/2019 0752   NA 145 11/03/2017 0930   K 4.2 01/04/2019 0752   K 3.6 11/03/2017 0930   CL 110 01/04/2019 0752   CO2 27 01/04/2019 0752   CO2 27 11/03/2017 0930   GLUCOSE 70 01/04/2019 0752   GLUCOSE 112 11/03/2017 0930   BUN 15 01/04/2019 0752   BUN 11 12/08/2017 1551   BUN 13.4 11/03/2017 0930   CREATININE 0.80 01/04/2019 0752   CREATININE 0.71 07/06/2018 0912   CREATININE 0.9 11/03/2017  0930   CALCIUM 8.6 (L) 01/04/2019 0752   CALCIUM 9.4 11/03/2017 0930   PROT 5.9 (L) 01/04/2019 0752   PROT 6.2 11/03/2017 0930   PROT 6.9 11/03/2017 0930   ALBUMIN 3.4 (L) 01/04/2019 0752   ALBUMIN 3.2 (L) 11/03/2017 0930   AST 21 01/04/2019 0752   AST 19 07/06/2018 0912   AST 20 11/03/2017 0930   ALT 17 01/04/2019 0752   ALT 17 07/06/2018 0912   ALT 18 11/03/2017 0930   ALKPHOS 59 01/04/2019 0752   ALKPHOS 84 11/03/2017 0930   BILITOT 0.5 01/04/2019 0752   BILITOT 0.7 07/06/2018 0912   BILITOT 0.64 11/03/2017 0930   GFRNONAA >60 01/04/2019 0752   GFRNONAA >60 07/06/2018 0912   GFRNONAA 64 01/17/2016 0910   GFRAA >60 01/04/2019 0752   GFRAA >60 07/06/2018 0912   GFRAA 74 01/17/2016 0910    No results found for: SPEP, UPEP  Lab Results  Component Value Date   WBC 3.8 (L) 01/04/2019   NEUTROABS 1.3 (  L) 01/04/2019   HGB 12.5 01/04/2019   HCT 39.0 01/04/2019   MCV 101.3 (H) 01/04/2019   PLT 123 (L) 01/04/2019      Chemistry      Component Value Date/Time   NA 146 (H) 01/04/2019 0752   NA 145 11/03/2017 0930   K 4.2 01/04/2019 0752   K 3.6 11/03/2017 0930   CL 110 01/04/2019 0752   CO2 27 01/04/2019 0752   CO2 27 11/03/2017 0930   BUN 15 01/04/2019 0752   BUN 11 12/08/2017 1551   BUN 13.4 11/03/2017 0930   CREATININE 0.80 01/04/2019 0752   CREATININE 0.71 07/06/2018 0912   CREATININE 0.9 11/03/2017 0930      Component Value Date/Time   CALCIUM 8.6 (L) 01/04/2019 0752   CALCIUM 9.4 11/03/2017 0930   ALKPHOS 59 01/04/2019 0752   ALKPHOS 84 11/03/2017 0930   AST 21 01/04/2019 0752   AST 19 07/06/2018 0912   AST 20 11/03/2017 0930   ALT 17 01/04/2019 0752   ALT 17 07/06/2018 0912   ALT 18 11/03/2017 0930   BILITOT 0.5 01/04/2019 0752   BILITOT 0.7 07/06/2018 0912   BILITOT 0.64 11/03/2017 0930       All questions were answered. The patient knows to call the clinic with any problems, questions or concerns. No barriers to learning was detected.  I  spent 15 minutes counseling the patient face to face. The total time spent in the appointment was 20 minutes and more than 50% was on counseling and review of test results  Heath Lark, MD 01/04/2019 8:43 AM

## 2019-01-05 DIAGNOSIS — C9002 Multiple myeloma in relapse: Secondary | ICD-10-CM | POA: Diagnosis not present

## 2019-01-06 DIAGNOSIS — C9002 Multiple myeloma in relapse: Secondary | ICD-10-CM | POA: Diagnosis not present

## 2019-01-07 DIAGNOSIS — C9002 Multiple myeloma in relapse: Secondary | ICD-10-CM | POA: Diagnosis not present

## 2019-01-08 DIAGNOSIS — C9002 Multiple myeloma in relapse: Secondary | ICD-10-CM | POA: Diagnosis not present

## 2019-01-09 DIAGNOSIS — C9002 Multiple myeloma in relapse: Secondary | ICD-10-CM | POA: Diagnosis not present

## 2019-01-10 DIAGNOSIS — C9002 Multiple myeloma in relapse: Secondary | ICD-10-CM | POA: Diagnosis not present

## 2019-01-11 ENCOUNTER — Encounter: Payer: Self-pay | Admitting: Hematology and Oncology

## 2019-01-11 ENCOUNTER — Telehealth: Payer: Self-pay | Admitting: Hematology and Oncology

## 2019-01-11 ENCOUNTER — Inpatient Hospital Stay: Payer: Medicare HMO

## 2019-01-11 ENCOUNTER — Inpatient Hospital Stay (HOSPITAL_BASED_OUTPATIENT_CLINIC_OR_DEPARTMENT_OTHER): Payer: Medicare HMO | Admitting: Hematology and Oncology

## 2019-01-11 ENCOUNTER — Ambulatory Visit: Payer: Medicare HMO

## 2019-01-11 ENCOUNTER — Other Ambulatory Visit: Payer: Self-pay

## 2019-01-11 DIAGNOSIS — Z79899 Other long term (current) drug therapy: Secondary | ICD-10-CM | POA: Diagnosis not present

## 2019-01-11 DIAGNOSIS — M549 Dorsalgia, unspecified: Secondary | ICD-10-CM

## 2019-01-11 DIAGNOSIS — Z7982 Long term (current) use of aspirin: Secondary | ICD-10-CM

## 2019-01-11 DIAGNOSIS — Z9221 Personal history of antineoplastic chemotherapy: Secondary | ICD-10-CM

## 2019-01-11 DIAGNOSIS — D61818 Other pancytopenia: Secondary | ICD-10-CM | POA: Diagnosis not present

## 2019-01-11 DIAGNOSIS — G893 Neoplasm related pain (acute) (chronic): Secondary | ICD-10-CM

## 2019-01-11 DIAGNOSIS — F5104 Psychophysiologic insomnia: Secondary | ICD-10-CM | POA: Diagnosis not present

## 2019-01-11 DIAGNOSIS — C9002 Multiple myeloma in relapse: Secondary | ICD-10-CM

## 2019-01-11 DIAGNOSIS — C9001 Multiple myeloma in remission: Secondary | ICD-10-CM

## 2019-01-11 LAB — COMPREHENSIVE METABOLIC PANEL
ALT: 16 U/L (ref 0–44)
AST: 19 U/L (ref 15–41)
Albumin: 3.5 g/dL (ref 3.5–5.0)
Alkaline Phosphatase: 60 U/L (ref 38–126)
Anion gap: 12 (ref 5–15)
BUN: 14 mg/dL (ref 8–23)
CO2: 26 mmol/L (ref 22–32)
Calcium: 8.9 mg/dL (ref 8.9–10.3)
Chloride: 110 mmol/L (ref 98–111)
Creatinine, Ser: 0.75 mg/dL (ref 0.44–1.00)
GFR calc Af Amer: >60 mL/min (ref >60)
GFR calc non Af Amer: >60 mL/min (ref >60)
Glucose, Bld: 94 mg/dL (ref 70–99)
Potassium: 4.7 mmol/L (ref 3.5–5.1)
Sodium: 148 mmol/L — ABNORMAL HIGH (ref 135–145)
Total Bilirubin: 0.4 mg/dL (ref 0.3–1.2)
Total Protein: 6.2 g/dL — ABNORMAL LOW (ref 6.5–8.1)

## 2019-01-11 LAB — CBC WITH DIFFERENTIAL/PLATELET
Abs Immature Granulocytes: 0.01 10*3/uL (ref 0.00–0.07)
Basophils Absolute: 0 10*3/uL (ref 0.0–0.1)
Basophils Relative: 0 %
Eosinophils Absolute: 0.1 10*3/uL (ref 0.0–0.5)
Eosinophils Relative: 4 %
HEMATOCRIT: 39.6 % (ref 36.0–46.0)
Hemoglobin: 12.5 g/dL (ref 12.0–15.0)
Immature Granulocytes: 0 %
LYMPHS ABS: 1.3 10*3/uL (ref 0.7–4.0)
Lymphocytes Relative: 50 %
MCH: 32.5 pg (ref 26.0–34.0)
MCHC: 31.6 g/dL (ref 30.0–36.0)
MCV: 102.9 fL — ABNORMAL HIGH (ref 80.0–100.0)
Monocytes Absolute: 0.1 10*3/uL (ref 0.1–1.0)
Monocytes Relative: 5 %
NEUTROS ABS: 1.1 10*3/uL — AB (ref 1.7–7.7)
Neutrophils Relative %: 41 %
Platelets: 102 10*3/uL — ABNORMAL LOW (ref 150–400)
RBC: 3.85 MIL/uL — ABNORMAL LOW (ref 3.87–5.11)
RDW: 16.2 % — ABNORMAL HIGH (ref 11.5–15.5)
WBC: 2.6 10*3/uL — ABNORMAL LOW (ref 4.0–10.5)
nRBC: 0 % (ref 0.0–0.2)

## 2019-01-11 MED ORDER — POMALIDOMIDE 2 MG PO CAPS: 2.0000 mg | ORAL_CAPSULE | Freq: Every day | ORAL | 0 refills | Status: DC

## 2019-01-11 NOTE — Assessment & Plan Note (Signed)
She is frail.  Even with reduced dose of Pomalyst, she has intermittent severe pancytopenia Her blood count is stable today although she is mildly neutropenic We discussed neutropenic precaution I will see her again next week for further follow-up

## 2019-01-11 NOTE — Progress Notes (Signed)
[place] OFFICE PROGRESS NOTE  Patient Care Team: Susy Frizzle, MD as PCP - General (Family Medicine)  ASSESSMENT & PLAN:  Multiple myeloma in relapse [facility]) So far, she tolerated treatment well except for progressive pancytopenia She will continue Pomalyst today.  I will continue to see her every week for toxicity review due to her frail status She will continue daily dexamethasone 1 mg. She will continue calcium with vitamin D along with acyclovir for antimicrobial prophylaxis She will continue aspirin for DVT prophylaxis  Acquired pancytopenia ([place]) She is frail.  Even with reduced dose of Pomalyst, she has intermittent severe pancytopenia Her blood count is stable today although she is mildly neutropenic We discussed neutropenic precaution I will see her again next week for further follow-up   No orders of the defined types were placed in this encounter.   INTERVAL HISTORY: Please see below for problem oriented charting. She returns with her sisters for further follow-up She feels fine.  No recent infection, fever or chills The patient denies any recent signs or symptoms of bleeding such as spontaneous epistaxis, hematuria or hematochezia. Denies fall.  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 Revlimid was stopped in July and resumed in October, stopped due to disease progression    04/01/2016 Adverse Reaction   Treatment was placed on hold temporarily due to neutropenia    12/25/2018 -  Chemotherapy    The patient had Pomalyst and dexamethasone     REVIEW OF SYSTEMS:   Constitutional: Denies fevers, chills or abnormal weight loss Eyes: Denies blurriness of vision Ears, nose, mouth, throat, and face: Denies mucositis or sore throat Respiratory: Denies cough, dyspnea or wheezes Cardiovascular: Denies palpitation, chest discomfort or lower extremity swelling Gastrointestinal:  Denies nausea, heartburn or change in bowel habits Skin: Denies abnormal skin rashes Lymphatics: Denies new lymphadenopathy or easy bruising Neurological:Denies numbness, tingling or new weaknesses Behavioral/Psych: Mood is stable, no new changes  All other systems were reviewed with the patient and are negative.  I have reviewed the past medical history, past surgical history, social history and family history with the patient and they are unchanged from previous note.  ALLERGIES:  is allergic to pravastatin and zocor [simvastatin].  MEDICATIONS:  Current Outpatient Medications  Medication Sig Dispense Refill  . acyclovir (ZOVIRAX) 400 MG tablet Take 1 tablet (400 mg total) by mouth daily. 90 tablet 11  . aspirin EC 81 MG tablet Take 81 mg by mouth daily.    . Cholecalciferol (VITAMIN D PO) Take by mouth.    . dexamethasone (DECADRON) 1 MG tablet TAKE 1 TABLET BY MOUTH ONCE DAILY 90 tablet 1  . donepezil (ARICEPT) 10 MG tablet Take 1/2 tablet daily for 2 weeks, then increase to 1 tablet daily 30 tablet 11  . lisinopril (PRINIVIL,ZESTRIL) 20 MG tablet Take 1 tablet (20 mg total) by mouth daily. 90 tablet 11  . loperamide (  IMODIUM) 2 MG capsule Take 1 capsule (2 mg total) by mouth 4 (four) times daily as needed for diarrhea or loose stools. 12 capsule 0  . morphine (MS CONTIN) 15 MG 12 hr tablet Take 1 tablet (15 mg total) by mouth every 12 (twelve) hours. 60 tablet 0  . oxyCODONE-acetaminophen (ROXICET) 5-325 MG tablet  Take 1 tablet by mouth every 6 (six) hours as needed for severe pain. 90 tablet 0  . pomalidomide (POMALYST) 2 MG capsule Take 1 capsule (2 mg total) by mouth daily. Take with water on days 1-21. Repeat every 28 days. 21 capsule 0   No current facility-administered medications for this visit.     PHYSICAL EXAMINATION: ECOG PERFORMANCE STATUS: 0 - Asymptomatic  Vitals:   01/11/19 0853  BP: 129/69  Pulse: (!) 51  Resp: 17  Temp: 97.8 F (36.6 C)  SpO2: 98%   Filed Weights   01/11/19 0853  Weight: 130 lb 12.8 oz (59.3 kg)    GENERAL:alert, no distress and comfortable.  She looks mildly cushingoid Musculoskeletal:no cyanosis of digits and no clubbing  NEURO: alert & oriented x 3 with fluent speech, no focal motor/sensory deficits  LABORATORY DATA:  I have reviewed the data as listed    Component Value Date/Time   NA 146 (H) 01/04/2019 0752   NA 145 11/03/2017 0930   K 4.2 01/04/2019 0752   K 3.6 11/03/2017 0930   CL 110 01/04/2019 0752   CO2 27 01/04/2019 0752   CO2 27 11/03/2017 0930   GLUCOSE 70 01/04/2019 0752   GLUCOSE 112 11/03/2017 0930   BUN 15 01/04/2019 0752   BUN 11 12/08/2017 1551   BUN 13.4 11/03/2017 0930   CREATININE 0.80 01/04/2019 0752   CREATININE 0.71 07/06/2018 0912   CREATININE 0.9 11/03/2017 0930   CALCIUM 8.6 (L) 01/04/2019 0752   CALCIUM 9.4 11/03/2017 0930   PROT 5.9 (L) 01/04/2019 0752   PROT 6.2 11/03/2017 0930   PROT 6.9 11/03/2017 0930   ALBUMIN 3.4 (L) 01/04/2019 0752   ALBUMIN 3.2 (L) 11/03/2017 0930   AST 21 01/04/2019 0752   AST 19 07/06/2018 0912   AST 20 11/03/2017 0930   ALT 17 01/04/2019 0752   ALT 17 07/06/2018 0912   ALT 18 11/03/2017 0930   ALKPHOS 59 01/04/2019 0752   ALKPHOS 84 11/03/2017 0930   BILITOT 0.5 01/04/2019 0752   BILITOT 0.7 07/06/2018 0912   BILITOT 0.64 11/03/2017 0930   GFRNONAA >60 01/04/2019 0752   GFRNONAA >60 07/06/2018 0912   GFRNONAA 64 01/17/2016 0910   GFRAA >60 01/04/2019 0752   GFRAA >60  07/06/2018 0912   GFRAA 74 01/17/2016 0910    No results found for: SPEP, UPEP  Lab Results  Component Value Date   WBC 2.6 (L) 01/11/2019   NEUTROABS 1.1 (L) 01/11/2019   HGB 12.5 01/11/2019   HCT 39.6 01/11/2019   MCV 102.9 (H) 01/11/2019   PLT 102 (L) 01/11/2019      Chemistry      Component Value Date/Time   NA 146 (H) 01/04/2019 0752   NA 145 11/03/2017 0930   K 4.2 01/04/2019 0752   K 3.6 11/03/2017 0930   CL 110 01/04/2019 0752   CO2 27 01/04/2019 0752   CO2 27 11/03/2017 0930   BUN 15 01/04/2019 0752   BUN 11 12/08/2017 1551   BUN 13.4 11/03/2017 0930   CREATININE 0.80 01/04/2019 0752   CREATININE 0.71 07/06/2018 0912   CREATININE 0.9 11/03/2017  0930      Component Value Date/Time   CALCIUM 8.6 (L) 01/04/2019 0752   CALCIUM 9.4 11/03/2017 0930   ALKPHOS 59 01/04/2019 0752   ALKPHOS 84 11/03/2017 0930   AST 21 01/04/2019 0752   AST 19 07/06/2018 0912   AST 20 11/03/2017 0930   ALT 17 01/04/2019 0752   ALT 17 07/06/2018 0912   ALT 18 11/03/2017 0930   BILITOT 0.5 01/04/2019 0752   BILITOT 0.7 07/06/2018 0912   BILITOT 0.64 11/03/2017 0930      All questions were answered. The patient knows to call the clinic with any problems, questions or concerns. No barriers to learning was detected.  I spent 10 minutes counseling the patient face to face. The total time spent in the appointment was 15 minutes and more than 50% was on counseling and review of test results  Heath Lark, MD 01/11/2019 8:57 AM

## 2019-01-11 NOTE — Telephone Encounter (Signed)
No los °

## 2019-01-11 NOTE — Assessment & Plan Note (Signed)
So far, she tolerated treatment well except for progressive pancytopenia She will continue Pomalyst today.  I will continue to see her every week for toxicity review due to her frail status She will continue daily dexamethasone 1 mg. She will continue calcium with vitamin D along with acyclovir for antimicrobial prophylaxis She will continue aspirin for DVT prophylaxis

## 2019-01-12 DIAGNOSIS — C9002 Multiple myeloma in relapse: Secondary | ICD-10-CM | POA: Diagnosis not present

## 2019-01-13 DIAGNOSIS — C9002 Multiple myeloma in relapse: Secondary | ICD-10-CM | POA: Diagnosis not present

## 2019-01-14 DIAGNOSIS — C9002 Multiple myeloma in relapse: Secondary | ICD-10-CM | POA: Diagnosis not present

## 2019-01-15 DIAGNOSIS — C9002 Multiple myeloma in relapse: Secondary | ICD-10-CM | POA: Diagnosis not present

## 2019-01-16 DIAGNOSIS — C9002 Multiple myeloma in relapse: Secondary | ICD-10-CM | POA: Diagnosis not present

## 2019-01-17 DIAGNOSIS — C9002 Multiple myeloma in relapse: Secondary | ICD-10-CM | POA: Diagnosis not present

## 2019-01-17 MED FILL — DONEPEZIL HCL 10 MG TABLET: 10 | 30 days supply | Qty: 30 | Fill #8

## 2019-01-18 ENCOUNTER — Telehealth: Payer: Self-pay | Admitting: Hematology and Oncology

## 2019-01-18 ENCOUNTER — Inpatient Hospital Stay (HOSPITAL_BASED_OUTPATIENT_CLINIC_OR_DEPARTMENT_OTHER): Payer: Medicare HMO | Admitting: Hematology and Oncology

## 2019-01-18 ENCOUNTER — Encounter: Payer: Self-pay | Admitting: Hematology and Oncology

## 2019-01-18 ENCOUNTER — Inpatient Hospital Stay: Payer: Medicare HMO | Attending: Hematology and Oncology

## 2019-01-18 DIAGNOSIS — G893 Neoplasm related pain (acute) (chronic): Secondary | ICD-10-CM | POA: Insufficient documentation

## 2019-01-18 DIAGNOSIS — C9002 Multiple myeloma in relapse: Secondary | ICD-10-CM | POA: Insufficient documentation

## 2019-01-18 DIAGNOSIS — D61818 Other pancytopenia: Secondary | ICD-10-CM | POA: Diagnosis not present

## 2019-01-18 DIAGNOSIS — Z7982 Long term (current) use of aspirin: Secondary | ICD-10-CM | POA: Diagnosis not present

## 2019-01-18 DIAGNOSIS — Z9221 Personal history of antineoplastic chemotherapy: Secondary | ICD-10-CM | POA: Diagnosis not present

## 2019-01-18 DIAGNOSIS — I10 Essential (primary) hypertension: Secondary | ICD-10-CM | POA: Insufficient documentation

## 2019-01-18 DIAGNOSIS — Z79899 Other long term (current) drug therapy: Secondary | ICD-10-CM | POA: Diagnosis not present

## 2019-01-18 DIAGNOSIS — C9001 Multiple myeloma in remission: Secondary | ICD-10-CM

## 2019-01-18 DIAGNOSIS — M7989 Other specified soft tissue disorders: Secondary | ICD-10-CM | POA: Diagnosis not present

## 2019-01-18 LAB — COMPREHENSIVE METABOLIC PANEL
ALT: 18 U/L (ref 0–44)
AST: 17 U/L (ref 15–41)
Albumin: 3.4 g/dL — ABNORMAL LOW (ref 3.5–5.0)
Alkaline Phosphatase: 59 U/L (ref 38–126)
Anion gap: 10 (ref 5–15)
BUN: 13 mg/dL (ref 8–23)
CO2: 28 mmol/L (ref 22–32)
CREATININE: 0.81 mg/dL (ref 0.44–1.00)
Calcium: 8.7 mg/dL — ABNORMAL LOW (ref 8.9–10.3)
Chloride: 107 mmol/L (ref 98–111)
GFR calc Af Amer: >60 mL/min (ref >60)
GFR calc non Af Amer: >60 mL/min (ref >60)
Glucose, Bld: 102 mg/dL — ABNORMAL HIGH (ref 70–99)
Potassium: 3.8 mmol/L (ref 3.5–5.1)
Sodium: 145 mmol/L (ref 135–145)
Total Bilirubin: 0.9 mg/dL (ref 0.3–1.2)
Total Protein: 6 g/dL — ABNORMAL LOW (ref 6.5–8.1)

## 2019-01-18 LAB — CBC WITH DIFFERENTIAL/PLATELET
ABS IMMATURE GRANULOCYTES: 0.03 10*3/uL (ref 0.00–0.07)
BASOS PCT: 1 %
Basophils Absolute: 0 10*3/uL (ref 0.0–0.1)
Eosinophils Absolute: 0.1 10*3/uL (ref 0.0–0.5)
Eosinophils Relative: 4 %
HCT: 40 % (ref 36.0–46.0)
Hemoglobin: 12.8 g/dL (ref 12.0–15.0)
Immature Granulocytes: 1 %
Lymphocytes Relative: 43 %
Lymphs Abs: 1.2 10*3/uL (ref 0.7–4.0)
MCH: 33.2 pg (ref 26.0–34.0)
MCHC: 32 g/dL (ref 30.0–36.0)
MCV: 103.6 fL — ABNORMAL HIGH (ref 80.0–100.0)
Monocytes Absolute: 0.2 10*3/uL (ref 0.1–1.0)
Monocytes Relative: 7 %
Neutro Abs: 1.3 10*3/uL — ABNORMAL LOW (ref 1.7–7.7)
Neutrophils Relative %: 44 %
Platelets: 85 10*3/uL — ABNORMAL LOW (ref 150–400)
RBC: 3.86 MIL/uL — ABNORMAL LOW (ref 3.87–5.11)
RDW: 16.2 % — ABNORMAL HIGH (ref 11.5–15.5)
WBC: 2.8 10*3/uL — ABNORMAL LOW (ref 4.0–10.5)
nRBC: 0.7 % — ABNORMAL HIGH (ref 0.0–0.2)

## 2019-01-18 MED ORDER — LISINOPRIL 10 MG PO TABS: 20.0000 mg | ORAL_TABLET | Freq: Every day | ORAL | 11 refills | Status: DC

## 2019-01-18 MED ORDER — POMALIDOMIDE 2 MG PO CAPS: 2.0000 mg | ORAL_CAPSULE | Freq: Every day | ORAL | 0 refills | Status: DC

## 2019-01-18 NOTE — Assessment & Plan Note (Signed)
Her blood pressure is low I recommend reducing lisinopril to 10 mg and reassess in her next visit

## 2019-01-18 NOTE — Assessment & Plan Note (Signed)
She has severe pancytopenia with excessive bruising I recommend stopping treatment and resume next week She does not need transfusion support

## 2019-01-18 NOTE — Telephone Encounter (Signed)
Gave avs and calendar ° °

## 2019-01-18 NOTE — Progress Notes (Signed)
[facility] OFFICE PROGRESS NOTE  Patient Care Team: Susy Frizzle, MD as PCP - General (Family Medicine)  ASSESSMENT & PLAN:  Multiple myeloma in relapse [facility]) So far, she tolerated treatment well except for progressive pancytopenia Due to excessive bruising, I recommend holding her Pomalyst Her sister is instructed to resume the Pomalyst for the patient on 01/26/2019 and I plan to see her within 7 days of treatment My gut feeling is telling me that she would likely only tolerate 1 week on 1 week off of treatment, but my preference would be to give her 2 weeks on 1 week off of treatment She will continue close follow-up with lab appointment every other week for further assessment She will continue daily dexamethasone 1 mg. She will continue calcium with vitamin D along with acyclovir for antimicrobial prophylaxis She will continue aspirin for DVT prophylaxis  Acquired pancytopenia ([place]) She has severe pancytopenia with excessive bruising I recommend stopping treatment and resume next week She does not need transfusion support  Essential hypertension Her blood pressure is low I recommend reducing lisinopril to 10 mg and reassess in her next visit   No orders of the defined types were placed in this encounter.   INTERVAL HISTORY: Please see below for problem oriented charting. She returns with her sister for further follow-up Since last time she was seen here, she developed excessive bruising throughout The patient denies fall There were no recent history of infection She denies worsening pain The patient denies any recent signs or symptoms of bleeding such as spontaneous epistaxis, hematuria or hematochezia.   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 Revlimid was stopped in July and resumed in October, stopped due to disease progression    04/01/2016 Adverse Reaction    Treatment was placed on hold temporarily due to neutropenia    12/25/2018 -  Chemotherapy    The patient had Pomalyst and dexamethasone     REVIEW OF SYSTEMS:   Constitutional: Denies fevers, chills or abnormal weight loss Eyes: Denies blurriness of vision Ears, nose, mouth, throat, and face: Denies mucositis or sore throat Respiratory: Denies cough, dyspnea or wheezes Cardiovascular: Denies palpitation, chest discomfort or lower extremity swelling Gastrointestinal:  Denies nausea, heartburn or change in bowel habits Skin: Denies abnormal skin rashes Lymphatics: Denies new lymphadenopathy Neurological:Denies numbness, tingling or new weaknesses Behavioral/Psych: Mood is stable, no new changes  All other systems were reviewed with the patient and are negative.  I have reviewed the past medical history, past surgical history, social history and family history with the patient and they are unchanged from previous note.  ALLERGIES:  is allergic to pravastatin and zocor [simvastatin].  MEDICATIONS:  Current Outpatient Medications  Medication Sig Dispense Refill  . acyclovir (ZOVIRAX) 400 MG tablet Take 1 tablet (400 mg total) by mouth daily. 90 tablet 11  . aspirin EC 81 MG tablet Take 81 mg by mouth daily.    Marland Kitchen  Cholecalciferol (VITAMIN D PO) Take by mouth.    . dexamethasone (DECADRON) 1 MG tablet TAKE 1 TABLET BY MOUTH ONCE DAILY 90 tablet 1  . donepezil (ARICEPT) 10 MG tablet Take 1/2 tablet daily for 2 weeks, then increase to 1 tablet daily 30 tablet  11  . lisinopril (PRINIVIL,ZESTRIL) 10 MG tablet Take 2 tablets (20 mg total) by mouth daily. 30 tablet 11  . loperamide (IMODIUM) 2 MG capsule Take 1 capsule (2 mg total) by mouth 4 (four) times daily as needed for diarrhea or loose stools. 12 capsule 0  . morphine (MS CONTIN) 15 MG 12 hr tablet Take 1 tablet (15 mg total) by mouth every 12 (twelve) hours. 60 tablet 0  . oxyCODONE-acetaminophen (ROXICET) 5-325 MG tablet Take 1 tablet by mouth every 6 (six) hours as needed for severe pain. 90 tablet 0  . pomalidomide (POMALYST) 2 MG capsule Take 1 capsule (2 mg total) by mouth daily. Take with water on days 1-14. Repeat every 21 days. 14 capsule 0   No current facility-administered medications for this visit.     PHYSICAL EXAMINATION: ECOG PERFORMANCE STATUS: 2 - Symptomatic, <50% confined to bed  Vitals:   01/18/19 1220  BP: 102/61  Pulse: 88  Resp: 18  Temp: (!) 97.4 F (36.3 C)  SpO2: 98%   Filed Weights   01/18/19 1220  Weight: 130 lb (59 kg)    GENERAL:alert, no distress and comfortable.  She looks mildly cushingoid SKIN: Significant bruises are noted Musculoskeletal:no cyanosis of digits and no clubbing  NEURO: alert & oriented x 3 with fluent speech, no focal motor/sensory deficits  LABORATORY DATA:  I have reviewed the data as listed    Component Value Date/Time   NA 145 01/18/2019 1202   NA 145 11/03/2017 0930   K 3.8 01/18/2019 1202   K 3.6 11/03/2017 0930   CL 107 01/18/2019 1202   CO2 28 01/18/2019 1202   CO2 27 11/03/2017 0930   GLUCOSE 102 (H) 01/18/2019 1202   GLUCOSE 112 11/03/2017 0930   BUN 13 01/18/2019 1202   BUN 11 12/08/2017 1551   BUN 13.4 11/03/2017 0930   CREATININE 0.81 01/18/2019 1202   CREATININE 0.71 07/06/2018 0912   CREATININE 0.9 11/03/2017 0930   CALCIUM 8.7 (L) 01/18/2019 1202   CALCIUM 9.4 11/03/2017 0930   PROT 6.0 (L) 01/18/2019 1202   PROT 6.2 11/03/2017 0930   PROT 6.9 11/03/2017 0930   ALBUMIN 3.4 (L) 01/18/2019 1202    ALBUMIN 3.2 (L) 11/03/2017 0930   AST 17 01/18/2019 1202   AST 19 07/06/2018 0912   AST 20 11/03/2017 0930   ALT 18 01/18/2019 1202   ALT 17 07/06/2018 0912   ALT 18 11/03/2017 0930   ALKPHOS 59 01/18/2019 1202   ALKPHOS 84 11/03/2017 0930   BILITOT 0.9 01/18/2019 1202   BILITOT 0.7 07/06/2018 0912   BILITOT 0.64 11/03/2017 0930   GFRNONAA >60 01/18/2019 1202   GFRNONAA >60 07/06/2018 0912   GFRNONAA 64 01/17/2016 0910   GFRAA >60 01/18/2019 1202   GFRAA >60 07/06/2018 0912   GFRAA 74 01/17/2016 0910    No results found for: SPEP, UPEP  Lab Results  Component Value Date   WBC 2.8 (L) 01/18/2019   NEUTROABS 1.3 (L) 01/18/2019   HGB 12.8 01/18/2019   HCT 40.0 01/18/2019   MCV 103.6 (H) 01/18/2019   PLT 85 (L) 01/18/2019      Chemistry      Component Value Date/Time  NA 145 01/18/2019 1202   NA 145 11/03/2017 0930   K 3.8 01/18/2019 1202   K 3.6 11/03/2017 0930   CL 107 01/18/2019 1202   CO2 28 01/18/2019 1202   CO2 27 11/03/2017 0930   BUN 13 01/18/2019 1202   BUN 11 12/08/2017 1551   BUN 13.4 11/03/2017 0930   CREATININE 0.81 01/18/2019 1202   CREATININE 0.71 07/06/2018 0912   CREATININE 0.9 11/03/2017 0930      Component Value Date/Time   CALCIUM 8.7 (L) 01/18/2019 1202   CALCIUM 9.4 11/03/2017 0930   ALKPHOS 59 01/18/2019 1202   ALKPHOS 84 11/03/2017 0930   AST 17 01/18/2019 1202   AST 19 07/06/2018 0912   AST 20 11/03/2017 0930   ALT 18 01/18/2019 1202   ALT 17 07/06/2018 0912   ALT 18 11/03/2017 0930   BILITOT 0.9 01/18/2019 1202   BILITOT 0.7 07/06/2018 0912   BILITOT 0.64 11/03/2017 0930       All questions were answered. The patient knows to call the clinic with any problems, questions or concerns. No barriers to learning was detected.  I spent 20 minutes counseling the patient face to face. The total time spent in the appointment was 30 minutes and more than 50% was on counseling and review of test results  Heath Lark, MD 01/18/2019 1:18  PM

## 2019-01-18 NOTE — Assessment & Plan Note (Signed)
So far, she tolerated treatment well except for progressive pancytopenia Due to excessive bruising, I recommend holding her Pomalyst Her sister is instructed to resume the Pomalyst for the patient on 01/26/2019 and I plan to see her within 7 days of treatment My gut feeling is telling me that she would likely only tolerate 1 week on 1 week off of treatment, but my preference would be to give her 2 weeks on 1 week off of treatment She will continue close follow-up with lab appointment every other week for further assessment She will continue daily dexamethasone 1 mg. She will continue calcium with vitamin D along with acyclovir for antimicrobial prophylaxis She will continue aspirin for DVT prophylaxis

## 2019-01-19 DIAGNOSIS — C9002 Multiple myeloma in relapse: Secondary | ICD-10-CM | POA: Diagnosis not present

## 2019-01-20 DIAGNOSIS — C9002 Multiple myeloma in relapse: Secondary | ICD-10-CM | POA: Diagnosis not present

## 2019-01-21 DIAGNOSIS — C9002 Multiple myeloma in relapse: Secondary | ICD-10-CM | POA: Diagnosis not present

## 2019-01-22 DIAGNOSIS — C9002 Multiple myeloma in relapse: Secondary | ICD-10-CM | POA: Diagnosis not present

## 2019-01-23 DIAGNOSIS — C9002 Multiple myeloma in relapse: Secondary | ICD-10-CM | POA: Diagnosis not present

## 2019-01-24 DIAGNOSIS — C9002 Multiple myeloma in relapse: Secondary | ICD-10-CM | POA: Diagnosis not present

## 2019-01-25 DIAGNOSIS — C9002 Multiple myeloma in relapse: Secondary | ICD-10-CM | POA: Diagnosis not present

## 2019-01-26 ENCOUNTER — Encounter: Payer: Self-pay | Admitting: *Deleted

## 2019-01-26 DIAGNOSIS — C9002 Multiple myeloma in relapse: Secondary | ICD-10-CM | POA: Diagnosis not present

## 2019-01-27 DIAGNOSIS — C9002 Multiple myeloma in relapse: Secondary | ICD-10-CM | POA: Diagnosis not present

## 2019-01-28 DIAGNOSIS — C9002 Multiple myeloma in relapse: Secondary | ICD-10-CM | POA: Diagnosis not present

## 2019-01-29 DIAGNOSIS — C9002 Multiple myeloma in relapse: Secondary | ICD-10-CM | POA: Diagnosis not present

## 2019-01-30 DIAGNOSIS — C9002 Multiple myeloma in relapse: Secondary | ICD-10-CM | POA: Diagnosis not present

## 2019-01-31 DIAGNOSIS — C9002 Multiple myeloma in relapse: Secondary | ICD-10-CM | POA: Diagnosis not present

## 2019-02-01 ENCOUNTER — Encounter: Payer: Self-pay | Admitting: Hematology and Oncology

## 2019-02-01 ENCOUNTER — Inpatient Hospital Stay (HOSPITAL_BASED_OUTPATIENT_CLINIC_OR_DEPARTMENT_OTHER): Payer: Medicare HMO | Admitting: Hematology and Oncology

## 2019-02-01 ENCOUNTER — Other Ambulatory Visit: Payer: Self-pay

## 2019-02-01 ENCOUNTER — Inpatient Hospital Stay: Payer: Medicare HMO

## 2019-02-01 VITALS — BP 144/60 | HR 80 | Temp 97.8°F | Resp 18 | Ht 62.0 in | Wt 135.2 lb

## 2019-02-01 DIAGNOSIS — C9002 Multiple myeloma in relapse: Secondary | ICD-10-CM

## 2019-02-01 DIAGNOSIS — Z7982 Long term (current) use of aspirin: Secondary | ICD-10-CM | POA: Diagnosis not present

## 2019-02-01 DIAGNOSIS — G893 Neoplasm related pain (acute) (chronic): Secondary | ICD-10-CM

## 2019-02-01 DIAGNOSIS — I10 Essential (primary) hypertension: Secondary | ICD-10-CM

## 2019-02-01 DIAGNOSIS — M7989 Other specified soft tissue disorders: Secondary | ICD-10-CM

## 2019-02-01 DIAGNOSIS — D61818 Other pancytopenia: Secondary | ICD-10-CM

## 2019-02-01 DIAGNOSIS — Z9221 Personal history of antineoplastic chemotherapy: Secondary | ICD-10-CM | POA: Diagnosis not present

## 2019-02-01 DIAGNOSIS — C9001 Multiple myeloma in remission: Secondary | ICD-10-CM

## 2019-02-01 DIAGNOSIS — Z79899 Other long term (current) drug therapy: Secondary | ICD-10-CM | POA: Diagnosis not present

## 2019-02-01 LAB — CBC WITH DIFFERENTIAL/PLATELET
Abs Immature Granulocytes: 0.05 10*3/uL (ref 0.00–0.07)
Basophils Absolute: 0 10*3/uL (ref 0.0–0.1)
Basophils Relative: 1 %
Eosinophils Absolute: 0 10*3/uL (ref 0.0–0.5)
Eosinophils Relative: 1 %
HCT: 38.2 % (ref 36.0–46.0)
Hemoglobin: 12.3 g/dL (ref 12.0–15.0)
Immature Granulocytes: 1 %
Lymphocytes Relative: 26 %
Lymphs Abs: 1.1 10*3/uL (ref 0.7–4.0)
MCH: 33.3 pg (ref 26.0–34.0)
MCHC: 32.2 g/dL (ref 30.0–36.0)
MCV: 103.5 fL — ABNORMAL HIGH (ref 80.0–100.0)
MONO ABS: 0.1 10*3/uL (ref 0.1–1.0)
Monocytes Relative: 3 %
Neutro Abs: 2.7 10*3/uL (ref 1.7–7.7)
Neutrophils Relative %: 68 %
Platelets: 152 10*3/uL (ref 150–400)
RBC: 3.69 MIL/uL — ABNORMAL LOW (ref 3.87–5.11)
RDW: 16.6 % — ABNORMAL HIGH (ref 11.5–15.5)
WBC: 4 10*3/uL (ref 4.0–10.5)
nRBC: 0 % (ref 0.0–0.2)

## 2019-02-01 LAB — COMPREHENSIVE METABOLIC PANEL
ALT: 20 U/L (ref 0–44)
AST: 21 U/L (ref 15–41)
Albumin: 3.3 g/dL — ABNORMAL LOW (ref 3.5–5.0)
Alkaline Phosphatase: 79 U/L (ref 38–126)
Anion gap: 14 (ref 5–15)
BUN: 12 mg/dL (ref 8–23)
CO2: 26 mmol/L (ref 22–32)
Calcium: 9.1 mg/dL (ref 8.9–10.3)
Chloride: 105 mmol/L (ref 98–111)
Creatinine, Ser: 0.78 mg/dL (ref 0.44–1.00)
GFR calc Af Amer: >60 mL/min (ref >60)
GFR calc non Af Amer: >60 mL/min (ref >60)
GLUCOSE: 86 mg/dL (ref 70–99)
Potassium: 4.3 mmol/L (ref 3.5–5.1)
Sodium: 145 mmol/L (ref 135–145)
Total Bilirubin: 0.5 mg/dL (ref 0.3–1.2)
Total Protein: 6.3 g/dL — ABNORMAL LOW (ref 6.5–8.1)

## 2019-02-01 MED ORDER — DEXAMETHASONE 0.5 MG PO TABS: 0.5000 mg | ORAL_TABLET | Freq: Every day | ORAL | 3 refills | Status: DC

## 2019-02-01 MED ORDER — MORPHINE SULFATE ER 15 MG PO TBCR: 15.0000 mg | EXTENDED_RELEASE_TABLET | Freq: Two times a day (BID) | ORAL | 0 refills | Status: DC

## 2019-02-01 MED FILL — DEXAMETHASONE 0.5 MG TABLET: 0.5 | 30 days supply | Qty: 30 | Fill #0

## 2019-02-01 MED FILL — MORPHINE SULF ER 15 MG TAB: 15 | 30 days supply | Qty: 60 | Fill #0

## 2019-02-01 NOTE — Assessment & Plan Note (Signed)
She has chronic back pain from her disease I recommend MS Contin twice a day along with oxycodone as needed for breakthrough pain 

## 2019-02-01 NOTE — Assessment & Plan Note (Signed)
Repeat myeloma panel is pending She appears to tolerate last week treatment well She will proceed to finish 14 days of Pomalyst, take 7 days break and see me back at the end of the month before she resume her next cycle of Pomalyst I will modify her treatment to take 2 mg for 14 days, rest 7 days, 4 cycles every 21 days She will continue calcium with vitamin D along with Zometa for bone health She will continue acyclovir for antimicrobial prophylaxis and aspirin for DVT prophylaxis I recommend reducing dexamethasone to 0.5 mg daily

## 2019-02-02 ENCOUNTER — Encounter: Payer: Self-pay | Admitting: Hematology and Oncology

## 2019-02-02 ENCOUNTER — Other Ambulatory Visit: Payer: Self-pay

## 2019-02-02 DIAGNOSIS — C9002 Multiple myeloma in relapse: Secondary | ICD-10-CM | POA: Diagnosis not present

## 2019-02-02 LAB — MULTIPLE MYELOMA PANEL, SERUM
ALBUMIN/GLOB SERPL: 1.5 (ref 0.7–1.7)
Albumin SerPl Elph-Mcnc: 3.5 g/dL (ref 2.9–4.4)
Alpha 1: 0.3 g/dL (ref 0.0–0.4)
Alpha2 Glob SerPl Elph-Mcnc: 0.8 g/dL (ref 0.4–1.0)
B-Globulin SerPl Elph-Mcnc: 1.1 g/dL (ref 0.7–1.3)
GAMMA GLOB SERPL ELPH-MCNC: 0.3 g/dL — AB (ref 0.4–1.8)
Globulin, Total: 2.4 g/dL (ref 2.2–3.9)
IgA: 274 mg/dL (ref 64–422)
IgG (Immunoglobin G), Serum: 382 mg/dL — ABNORMAL LOW (ref 700–1600)
IgM (Immunoglobulin M), Srm: 7 mg/dL — ABNORMAL LOW (ref 26–217)
Total Protein ELP: 5.9 g/dL — ABNORMAL LOW (ref 6.0–8.5)

## 2019-02-02 LAB — KAPPA/LAMBDA LIGHT CHAINS
KAPPA FREE LGHT CHN: 284.1 mg/L — AB (ref 3.3–19.4)
Kappa, lambda light chain ratio: 43.71 — ABNORMAL HIGH (ref 0.26–1.65)
Lambda free light chains: 6.5 mg/L (ref 5.7–26.3)

## 2019-02-02 MED ORDER — POMALIDOMIDE 2 MG PO CAPS: 2.0000 mg | ORAL_CAPSULE | Freq: Every day | ORAL | 0 refills | Status: DC

## 2019-02-02 NOTE — Progress Notes (Signed)
[place] OFFICE PROGRESS NOTE  Patient Care Team: Susy Frizzle, MD as PCP - General (Family Medicine)  ASSESSMENT & PLAN:  Multiple myeloma in relapse [facility]) Repeat myeloma panel is pending She appears to tolerate last week treatment well She will proceed to finish 14 days of Pomalyst, take 7 days break and see me back at the end of the month before she resume her next cycle of Pomalyst I will modify her treatment to take 2 mg for 14 days, rest 7 days, 4 cycles every 21 days She will continue calcium with vitamin D along with Zometa for bone health She will continue acyclovir for antimicrobial prophylaxis and aspirin for DVT prophylaxis I recommend reducing dexamethasone to 0.5 mg daily  Cancer associated pain She has chronic back pain from her disease I recommend MS Contin twice a day along with oxycodone as needed for breakthrough pain  Essential hypertension Her blood pressure is fluctuates I recommend continue on lisinopril at 10 mg and reassess in her next visit   No orders of the defined types were placed in this encounter.   INTERVAL HISTORY: Please see below for problem oriented charting. She returns with her sister for further follow-up There were no reported recent fall Appetite is stable and she has gained some weight Pain is stable She has intermittent leg swelling, improving The patient denies any recent signs or symptoms of bleeding such as spontaneous epistaxis, hematuria or hematochezia. 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 Revlimid was stopped in July and resumed in October, stopped due to disease progression    04/01/2016 Adverse Reaction    Treatment was placed on hold temporarily due to neutropenia    12/25/2018 -  Chemotherapy    The patient had Pomalyst and dexamethasone     REVIEW OF SYSTEMS:   Constitutional: Denies fevers, chills or abnormal weight loss Eyes: Denies blurriness of vision Ears, nose, mouth, throat, and face: Denies mucositis or sore throat Respiratory: Denies cough, dyspnea or wheezes Cardiovascular: Denies palpitation, chest discomfort  Gastrointestinal:  Denies nausea, heartburn or change in bowel habits Skin: Denies abnormal skin rashes Lymphatics: Denies new lymphadenopathy Neurological:Denies numbness, tingling or new weaknesses Behavioral/Psych: Mood is stable, no new changes  All other systems were reviewed with the patient and are negative.  I have reviewed the past medical history, past surgical history, social history and family history with the patient and they are unchanged from previous note.  ALLERGIES:  is allergic to pravastatin and zocor [simvastatin].  MEDICATIONS:  Current Outpatient Medications  Medication Sig Dispense Refill  . acyclovir (ZOVIRAX) 400 MG tablet Take 1 tablet (400 mg total) by mouth daily. 90 tablet 11  . aspirin EC 81 MG tablet Take 81 mg by mouth daily.    . Cholecalciferol (VITAMIN D PO) Take by mouth.    . dexamethasone (DECADRON) 0.5 MG tablet Take 1 tablet (  0.5 mg total) by mouth daily. 30 tablet 3  . donepezil (ARICEPT) 10 MG tablet Take 1/2 tablet daily for 2 weeks, then increase to 1 tablet daily 30 tablet 11  . lisinopril (PRINIVIL,ZESTRIL) 10 MG tablet Take 2 tablets (20 mg total) by mouth daily. 30 tablet 11  . loperamide (IMODIUM) 2 MG capsule Take 1 capsule (2 mg total) by mouth  4 (four) times daily as needed for diarrhea or loose stools. 12 capsule 0  . morphine (MS CONTIN) 15 MG 12 hr tablet Take 1 tablet (15 mg total) by mouth every 12 (twelve) hours. 60 tablet 0  . oxyCODONE-acetaminophen (ROXICET) 5-325 MG tablet Take 1 tablet by mouth every 6 (six) hours as needed for severe pain. 90 tablet 0  . pomalidomide (POMALYST) 2 MG capsule Take 1 capsule (2 mg total) by mouth daily. Take with water on days 1-14. Repeat every 21 days. 14 capsule 0   No current facility-administered medications for this visit.     PHYSICAL EXAMINATION: ECOG PERFORMANCE STATUS: 1 - Symptomatic but completely ambulatory  Vitals:   02/01/19 0857  BP: (!) 144/60  Pulse: 80  Resp: 18  Temp: 97.8 F (36.6 C)  SpO2: 100%   Filed Weights   02/01/19 0857  Weight: 135 lb 3.2 oz (61.3 kg)    GENERAL:alert, no distress and comfortable SKIN: skin color, texture, turgor are normal, no rashes or significant lesions.  Noted skin bruises HEART: mild bilateral lower extremity edema NEURO: alert & oriented x 3 with fluent speech, no focal motor/sensory deficits  LABORATORY DATA:  I have reviewed the data as listed    Component Value Date/Time   NA 145 02/01/2019 0840   NA 145 11/03/2017 0930   K 4.3 02/01/2019 0840   K 3.6 11/03/2017 0930   CL 105 02/01/2019 0840   CO2 26 02/01/2019 0840   CO2 27 11/03/2017 0930   GLUCOSE 86 02/01/2019 0840   GLUCOSE 112 11/03/2017 0930   BUN 12 02/01/2019 0840   BUN 11 12/08/2017 1551   BUN 13.4 11/03/2017 0930   CREATININE 0.78 02/01/2019 0840   CREATININE 0.71 07/06/2018 0912   CREATININE 0.9 11/03/2017 0930   CALCIUM 9.1 02/01/2019 0840   CALCIUM 9.4 11/03/2017 0930   PROT 6.3 (L) 02/01/2019 0840   PROT 6.2 11/03/2017 0930   PROT 6.9 11/03/2017 0930   ALBUMIN 3.3 (L) 02/01/2019 0840   ALBUMIN 3.2 (L) 11/03/2017 0930   AST 21 02/01/2019 0840   AST 19 07/06/2018 0912   AST 20 11/03/2017 0930   ALT 20 02/01/2019 0840   ALT 17 07/06/2018  0912   ALT 18 11/03/2017 0930   ALKPHOS 79 02/01/2019 0840   ALKPHOS 84 11/03/2017 0930   BILITOT 0.5 02/01/2019 0840   BILITOT 0.7 07/06/2018 0912   BILITOT 0.64 11/03/2017 0930   GFRNONAA >60 02/01/2019 0840   GFRNONAA >60 07/06/2018 0912   GFRNONAA 64 01/17/2016 0910   GFRAA >60 02/01/2019 0840   GFRAA >60 07/06/2018 0912   GFRAA 74 01/17/2016 0910    No results found for: SPEP, UPEP  Lab Results  Component Value Date   WBC 4.0 02/01/2019   NEUTROABS 2.7 02/01/2019   HGB 12.3 02/01/2019   HCT 38.2 02/01/2019   MCV 103.5 (H) 02/01/2019   PLT 152 02/01/2019      Chemistry      Component Value Date/Time   NA 145 02/01/2019 0840   NA 145 11/03/2017 0930   K 4.3 02/01/2019  0840   K 3.6 11/03/2017 0930   CL 105 02/01/2019 0840   CO2 26 02/01/2019 0840   CO2 27 11/03/2017 0930   BUN 12 02/01/2019 0840   BUN 11 12/08/2017 1551   BUN 13.4 11/03/2017 0930   CREATININE 0.78 02/01/2019 0840   CREATININE 0.71 07/06/2018 0912   CREATININE 0.9 11/03/2017 0930      Component Value Date/Time   CALCIUM 9.1 02/01/2019 0840   CALCIUM 9.4 11/03/2017 0930   ALKPHOS 79 02/01/2019 0840   ALKPHOS 84 11/03/2017 0930   AST 21 02/01/2019 0840   AST 19 07/06/2018 0912   AST 20 11/03/2017 0930   ALT 20 02/01/2019 0840   ALT 17 07/06/2018 0912   ALT 18 11/03/2017 0930   BILITOT 0.5 02/01/2019 0840   BILITOT 0.7 07/06/2018 0912   BILITOT 0.64 11/03/2017 0930      All questions were answered. The patient knows to call the clinic with any problems, questions or concerns. No barriers to learning was detected.  I spent 15 minutes counseling the patient face to face. The total time spent in the appointment was 20 minutes and more than 50% was on counseling and review of test results  Heath Lark, MD 02/02/2019 8:05 AM

## 2019-02-02 NOTE — Assessment & Plan Note (Signed)
Her blood pressure is fluctuates I recommend continue on lisinopril at 10 mg and reassess in her next visit

## 2019-02-03 DIAGNOSIS — C9002 Multiple myeloma in relapse: Secondary | ICD-10-CM | POA: Diagnosis not present

## 2019-02-04 DIAGNOSIS — C9002 Multiple myeloma in relapse: Secondary | ICD-10-CM | POA: Diagnosis not present

## 2019-02-05 DIAGNOSIS — C9002 Multiple myeloma in relapse: Secondary | ICD-10-CM | POA: Diagnosis not present

## 2019-02-06 DIAGNOSIS — C9002 Multiple myeloma in relapse: Secondary | ICD-10-CM | POA: Diagnosis not present

## 2019-02-15 ENCOUNTER — Encounter: Payer: Self-pay | Admitting: Hematology and Oncology

## 2019-02-15 ENCOUNTER — Inpatient Hospital Stay (HOSPITAL_BASED_OUTPATIENT_CLINIC_OR_DEPARTMENT_OTHER): Payer: Medicare HMO | Admitting: Hematology and Oncology

## 2019-02-15 ENCOUNTER — Inpatient Hospital Stay: Payer: Medicare HMO

## 2019-02-15 ENCOUNTER — Other Ambulatory Visit: Payer: Self-pay

## 2019-02-15 DIAGNOSIS — I10 Essential (primary) hypertension: Secondary | ICD-10-CM | POA: Diagnosis not present

## 2019-02-15 DIAGNOSIS — C9002 Multiple myeloma in relapse: Secondary | ICD-10-CM | POA: Diagnosis not present

## 2019-02-15 DIAGNOSIS — G893 Neoplasm related pain (acute) (chronic): Secondary | ICD-10-CM

## 2019-02-15 DIAGNOSIS — C9001 Multiple myeloma in remission: Secondary | ICD-10-CM

## 2019-02-15 DIAGNOSIS — Z9221 Personal history of antineoplastic chemotherapy: Secondary | ICD-10-CM | POA: Diagnosis not present

## 2019-02-15 DIAGNOSIS — D61818 Other pancytopenia: Secondary | ICD-10-CM | POA: Diagnosis not present

## 2019-02-15 DIAGNOSIS — Z7982 Long term (current) use of aspirin: Secondary | ICD-10-CM | POA: Diagnosis not present

## 2019-02-15 DIAGNOSIS — M7989 Other specified soft tissue disorders: Secondary | ICD-10-CM

## 2019-02-15 DIAGNOSIS — Z79899 Other long term (current) drug therapy: Secondary | ICD-10-CM

## 2019-02-15 LAB — CBC WITH DIFFERENTIAL/PLATELET
Abs Immature Granulocytes: 0.02 10*3/uL (ref 0.00–0.07)
Basophils Absolute: 0.1 10*3/uL (ref 0.0–0.1)
Basophils Relative: 2 %
Eosinophils Absolute: 0.1 10*3/uL (ref 0.0–0.5)
Eosinophils Relative: 1 %
HCT: 39.3 % (ref 36.0–46.0)
Hemoglobin: 12.4 g/dL (ref 12.0–15.0)
IMMATURE GRANULOCYTES: 1 %
Lymphocytes Relative: 46 %
Lymphs Abs: 2 10*3/uL (ref 0.7–4.0)
MCH: 33 pg (ref 26.0–34.0)
MCHC: 31.6 g/dL (ref 30.0–36.0)
MCV: 104.5 fL — AB (ref 80.0–100.0)
MONOS PCT: 12 %
Monocytes Absolute: 0.5 10*3/uL (ref 0.1–1.0)
NEUTROS PCT: 38 %
Neutro Abs: 1.6 10*3/uL — ABNORMAL LOW (ref 1.7–7.7)
Platelets: 138 10*3/uL — ABNORMAL LOW (ref 150–400)
RBC: 3.76 MIL/uL — ABNORMAL LOW (ref 3.87–5.11)
RDW: 16.4 % — ABNORMAL HIGH (ref 11.5–15.5)
WBC: 4.2 10*3/uL (ref 4.0–10.5)
nRBC: 0 % (ref 0.0–0.2)

## 2019-02-15 LAB — COMPREHENSIVE METABOLIC PANEL
ALT: 13 U/L (ref 0–44)
AST: 17 U/L (ref 15–41)
Albumin: 3.3 g/dL — ABNORMAL LOW (ref 3.5–5.0)
Alkaline Phosphatase: 81 U/L (ref 38–126)
Anion gap: 12 (ref 5–15)
BUN: 9 mg/dL (ref 8–23)
CO2: 28 mmol/L (ref 22–32)
Calcium: 9.1 mg/dL (ref 8.9–10.3)
Chloride: 109 mmol/L (ref 98–111)
Creatinine, Ser: 0.78 mg/dL (ref 0.44–1.00)
GFR calc Af Amer: >60 mL/min (ref >60)
GFR calc non Af Amer: >60 mL/min (ref >60)
Glucose, Bld: 91 mg/dL (ref 70–99)
Potassium: 4 mmol/L (ref 3.5–5.1)
Sodium: 149 mmol/L — ABNORMAL HIGH (ref 135–145)
Total Bilirubin: 0.6 mg/dL (ref 0.3–1.2)
Total Protein: 6.4 g/dL — ABNORMAL LOW (ref 6.5–8.1)

## 2019-02-15 MED FILL — DONEPEZIL HCL 10 MG TABLET: 10 | 30 days supply | Qty: 30 | Fill #9

## 2019-02-15 NOTE — Assessment & Plan Note (Signed)
She has chronic back pain from her disease I recommend MS Contin twice a day along with oxycodone as needed for breakthrough pain

## 2019-02-15 NOTE — Assessment & Plan Note (Signed)
Repeat myeloma panel showed no detectable M protein.  Serum light chains are still elevated She appears to tolerate last week treatment well She will proceed to finish 14 days of Pomalyst, take 7 days break and see me back in 3 weeks before she resume her next cycle of Pomalyst I will modify her treatment to take 2 mg for 14 days, rest 7 days, 4 cycles every 21 days She will continue calcium with vitamin D along with Zometa for bone health She will continue acyclovir for antimicrobial prophylaxis and aspirin for DVT prophylaxis I recommend reducing dexamethasone to 0.5 mg daily

## 2019-02-15 NOTE — Progress Notes (Signed)
[facility] OFFICE PROGRESS NOTE  Patient Care Team: Susy Frizzle, MD as PCP - General (Family Medicine)  ASSESSMENT & PLAN:  Multiple myeloma in relapse [facility]) Repeat myeloma panel showed no detectable M protein.  Serum light chains are still elevated She appears to tolerate last week treatment well She will proceed to finish 14 days of Pomalyst, take 7 days break and see me back in 3 weeks before she resume her next cycle of Pomalyst I will modify her treatment to take 2 mg for 14 days, rest 7 days, 4 cycles every 21 days She will continue calcium with vitamin D along with Zometa for bone health She will continue acyclovir for antimicrobial prophylaxis and aspirin for DVT prophylaxis I recommend reducing dexamethasone to 0.5 mg daily  Acquired pancytopenia ([place]) She has intermittent pancytopenia Overall, she is not symptomatic We will proceed with treatment as scheduled  Cancer associated pain She has chronic back pain from her disease I recommend MS Contin twice a day along with oxycodone as needed for breakthrough pain   No orders of the defined types were placed in this encounter.   INTERVAL HISTORY: Please see below for problem oriented charting. She returns for further follow-up Her back pain is about the same She denies recent infection, fever or chills The patient denies any recent signs or symptoms of bleeding such as spontaneous epistaxis, hematuria or hematochezia. 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 Revlimid was stopped in July and resumed in October, stopped due to disease progression    04/01/2016 Adverse Reaction    Treatment was placed on hold temporarily due to neutropenia    12/25/2018 -  Chemotherapy    The patient had Pomalyst and dexamethasone     REVIEW OF SYSTEMS:   Constitutional: Denies fevers, chills or abnormal weight loss Eyes: Denies blurriness of vision Ears, nose, mouth, throat, and face: Denies mucositis or sore throat Respiratory: Denies cough, dyspnea or wheezes Cardiovascular: Denies palpitation, chest discomfort or lower extremity swelling Gastrointestinal:  Denies nausea, heartburn or change in bowel habits Skin: Denies abnormal skin rashes Lymphatics: Denies new lymphadenopathy or easy bruising Neurological:Denies numbness, tingling or new weaknesses Behavioral/Psych: Mood is stable, no new changes  All other systems were reviewed with the patient and are negative.  I have reviewed the past medical history, past surgical history, social history and family history with the patient and they are unchanged from previous note.  ALLERGIES:  is allergic to pravastatin and zocor [simvastatin].  MEDICATIONS:  Current Outpatient Medications  Medication Sig Dispense Refill  . acyclovir (ZOVIRAX) 400 MG tablet Take 1 tablet (400 mg total) by mouth daily. 90 tablet 11  . aspirin EC 81 MG tablet Take 81 mg by mouth daily.    . Cholecalciferol (VITAMIN D PO) Take by mouth.    . dexamethasone (DECADRON) 0.5 MG tablet Take  1 tablet (0.5 mg total) by mouth daily. 30 tablet 3  . donepezil (ARICEPT) 10 MG tablet Take 1/2 tablet daily for 2 weeks, then increase to 1 tablet daily 30 tablet 11  . lisinopril (PRINIVIL,ZESTRIL) 10 MG tablet Take 2 tablets (20 mg total) by mouth daily. 30 tablet 11  . loperamide (IMODIUM) 2 MG capsule Take  1 capsule (2 mg total) by mouth 4 (four) times daily as needed for diarrhea or loose stools. 12 capsule 0  . morphine (MS CONTIN) 15 MG 12 hr tablet Take 1 tablet (15 mg total) by mouth every 12 (twelve) hours. 60 tablet 0  . oxyCODONE-acetaminophen (ROXICET) 5-325 MG tablet Take 1 tablet by mouth every 6 (six) hours as needed for severe pain. 90 tablet 0  . pomalidomide (POMALYST) 2 MG capsule Take 1 capsule (2 mg total) by mouth daily. Take with water on days 1-14. Repeat every 21 days. 14 capsule 0   No current facility-administered medications for this visit.     PHYSICAL EXAMINATION: ECOG PERFORMANCE STATUS: 1 - Symptomatic but completely ambulatory  Vitals:   02/15/19 0828  BP: (!) 153/87  Pulse: 93  Resp: 18  Temp: 98.3 F (36.8 C)  SpO2: 98%   Filed Weights   02/15/19 0828  Weight: 133 lb 3.2 oz (60.4 kg)    GENERAL:alert, no distress and comfortable SKIN: skin color, texture, turgor are normal, no rashes or significant lesions EYES: normal, Conjunctiva are pink and non-injected, sclera clear OROPHARYNX:no exudate, no erythema and lips, buccal mucosa, and tongue normal  NECK: supple, thyroid normal size, non-tender, without nodularity LYMPH:  no palpable lymphadenopathy in the cervical, axillary or inguinal LUNGS: clear to auscultation and percussion with normal breathing effort HEART: regular rate & rhythm and no murmurs and no lower extremity edema ABDOMEN:abdomen soft, non-tender and normal bowel sounds Musculoskeletal:no cyanosis of digits and no clubbing  NEURO: alert & oriented x 3 with fluent speech, no focal motor/sensory deficits  LABORATORY DATA:  I have reviewed the data as listed    Component Value Date/Time   NA 149 (H) 02/15/2019 0812   NA 145 11/03/2017 0930   K 4.0 02/15/2019 0812   K 3.6 11/03/2017 0930   CL 109 02/15/2019 0812   CO2 28 02/15/2019 0812   CO2 27 11/03/2017 0930   GLUCOSE 91 02/15/2019 0812   GLUCOSE 112 11/03/2017 0930   BUN 9  02/15/2019 0812   BUN 11 12/08/2017 1551   BUN 13.4 11/03/2017 0930   CREATININE 0.78 02/15/2019 0812   CREATININE 0.71 07/06/2018 0912   CREATININE 0.9 11/03/2017 0930   CALCIUM 9.1 02/15/2019 0812   CALCIUM 9.4 11/03/2017 0930   PROT 6.4 (L) 02/15/2019 0812   PROT 6.2 11/03/2017 0930   PROT 6.9 11/03/2017 0930   ALBUMIN 3.3 (L) 02/15/2019 0812   ALBUMIN 3.2 (L) 11/03/2017 0930   AST 17 02/15/2019 0812   AST 19 07/06/2018 0912   AST 20 11/03/2017 0930   ALT 13 02/15/2019 0812   ALT 17 07/06/2018 0912   ALT 18 11/03/2017 0930   ALKPHOS 81 02/15/2019 0812   ALKPHOS 84 11/03/2017 0930   BILITOT 0.6 02/15/2019 0812   BILITOT 0.7 07/06/2018 0912   BILITOT 0.64 11/03/2017 0930   GFRNONAA >60 02/15/2019 0812   GFRNONAA >60 07/06/2018 0912   GFRNONAA 64 01/17/2016 0910   GFRAA >60 02/15/2019 0812   GFRAA >60 07/06/2018 0912   GFRAA 74 01/17/2016 0910    No results found for:  SPEP, UPEP  Lab Results  Component Value Date   WBC 4.2 02/15/2019   NEUTROABS 1.6 (L) 02/15/2019   HGB 12.4 02/15/2019   HCT 39.3 02/15/2019   MCV 104.5 (H) 02/15/2019   PLT 138 (L) 02/15/2019      Chemistry      Component Value Date/Time   NA 149 (H) 02/15/2019 0812   NA 145 11/03/2017 0930   K 4.0 02/15/2019 0812   K 3.6 11/03/2017 0930   CL 109 02/15/2019 0812   CO2 28 02/15/2019 0812   CO2 27 11/03/2017 0930   BUN 9 02/15/2019 0812   BUN 11 12/08/2017 1551   BUN 13.4 11/03/2017 0930   CREATININE 0.78 02/15/2019 0812   CREATININE 0.71 07/06/2018 0912   CREATININE 0.9 11/03/2017 0930      Component Value Date/Time   CALCIUM 9.1 02/15/2019 0812   CALCIUM 9.4 11/03/2017 0930   ALKPHOS 81 02/15/2019 0812   ALKPHOS 84 11/03/2017 0930   AST 17 02/15/2019 0812   AST 19 07/06/2018 0912   AST 20 11/03/2017 0930   ALT 13 02/15/2019 0812   ALT 17 07/06/2018 0912   ALT 18 11/03/2017 0930   BILITOT 0.6 02/15/2019 0812   BILITOT 0.7 07/06/2018 0912   BILITOT 0.64 11/03/2017 0930        All questions were answered. The patient knows to call the clinic with any problems, questions or concerns. No barriers to learning was detected.  I spent 15 minutes counseling the patient face to face. The total time spent in the appointment was 20 minutes and more than 50% was on counseling and review of test results  Heath Lark, MD 02/15/2019 9:09 AM

## 2019-02-15 NOTE — Assessment & Plan Note (Signed)
She has intermittent pancytopenia Overall, she is not symptomatic We will proceed with treatment as scheduled

## 2019-03-02 ENCOUNTER — Other Ambulatory Visit: Payer: Self-pay

## 2019-03-02 DIAGNOSIS — C9002 Multiple myeloma in relapse: Secondary | ICD-10-CM

## 2019-03-02 MED ORDER — POMALIDOMIDE 2 MG PO CAPS: 2.0000 mg | ORAL_CAPSULE | Freq: Every day | ORAL | 0 refills | Status: DC

## 2019-03-07 MED FILL — LISINOPRIL 20 MG TABLET: 20 | 90 days supply | Qty: 90 | Fill #1

## 2019-03-07 MED FILL — DEXAMETHASONE 0.5 MG TABLET: 0.5 | 30 days supply | Qty: 30 | Fill #1

## 2019-03-08 ENCOUNTER — Encounter: Payer: Self-pay | Admitting: Hematology and Oncology

## 2019-03-08 ENCOUNTER — Inpatient Hospital Stay: Payer: Medicare HMO | Attending: Hematology and Oncology

## 2019-03-08 ENCOUNTER — Other Ambulatory Visit: Payer: Self-pay

## 2019-03-08 ENCOUNTER — Inpatient Hospital Stay (HOSPITAL_BASED_OUTPATIENT_CLINIC_OR_DEPARTMENT_OTHER): Payer: Medicare HMO | Admitting: Hematology and Oncology

## 2019-03-08 DIAGNOSIS — C9002 Multiple myeloma in relapse: Secondary | ICD-10-CM | POA: Insufficient documentation

## 2019-03-08 DIAGNOSIS — Z79899 Other long term (current) drug therapy: Secondary | ICD-10-CM | POA: Diagnosis not present

## 2019-03-08 DIAGNOSIS — R609 Edema, unspecified: Secondary | ICD-10-CM | POA: Diagnosis not present

## 2019-03-08 DIAGNOSIS — D61818 Other pancytopenia: Secondary | ICD-10-CM

## 2019-03-08 DIAGNOSIS — L03115 Cellulitis of right lower limb: Secondary | ICD-10-CM

## 2019-03-08 DIAGNOSIS — C9001 Multiple myeloma in remission: Secondary | ICD-10-CM

## 2019-03-08 DIAGNOSIS — Z7982 Long term (current) use of aspirin: Secondary | ICD-10-CM

## 2019-03-08 DIAGNOSIS — G893 Neoplasm related pain (acute) (chronic): Secondary | ICD-10-CM | POA: Diagnosis not present

## 2019-03-08 DIAGNOSIS — Z9221 Personal history of antineoplastic chemotherapy: Secondary | ICD-10-CM | POA: Insufficient documentation

## 2019-03-08 DIAGNOSIS — M48061 Spinal stenosis, lumbar region without neurogenic claudication: Secondary | ICD-10-CM

## 2019-03-08 LAB — COMPREHENSIVE METABOLIC PANEL
ALT: 12 U/L (ref 0–44)
AST: 18 U/L (ref 15–41)
Albumin: 3.5 g/dL (ref 3.5–5.0)
Alkaline Phosphatase: 65 U/L (ref 38–126)
Anion gap: 13 (ref 5–15)
BUN: 11 mg/dL (ref 8–23)
CO2: 24 mmol/L (ref 22–32)
Calcium: 8.8 mg/dL — ABNORMAL LOW (ref 8.9–10.3)
Chloride: 108 mmol/L (ref 98–111)
Creatinine, Ser: 0.78 mg/dL (ref 0.44–1.00)
GFR calc Af Amer: >60 mL/min (ref >60)
GFR calc non Af Amer: >60 mL/min (ref >60)
Glucose, Bld: 88 mg/dL (ref 70–99)
Potassium: 4 mmol/L (ref 3.5–5.1)
Sodium: 145 mmol/L (ref 135–145)
Total Bilirubin: 0.4 mg/dL (ref 0.3–1.2)
Total Protein: 6.4 g/dL — ABNORMAL LOW (ref 6.5–8.1)

## 2019-03-08 LAB — CBC WITH DIFFERENTIAL/PLATELET
Abs Immature Granulocytes: 0.01 10*3/uL (ref 0.00–0.07)
Basophils Absolute: 0.1 10*3/uL (ref 0.0–0.1)
Basophils Relative: 2 %
Eosinophils Absolute: 0.1 10*3/uL (ref 0.0–0.5)
Eosinophils Relative: 2 %
HCT: 39 % (ref 36.0–46.0)
Hemoglobin: 12.4 g/dL (ref 12.0–15.0)
Immature Granulocytes: 0 %
Lymphocytes Relative: 49 %
Lymphs Abs: 1.7 10*3/uL (ref 0.7–4.0)
MCH: 32.8 pg (ref 26.0–34.0)
MCHC: 31.8 g/dL (ref 30.0–36.0)
MCV: 103.2 fL — ABNORMAL HIGH (ref 80.0–100.0)
Monocytes Absolute: 0.6 10*3/uL (ref 0.1–1.0)
Monocytes Relative: 17 %
Neutro Abs: 1 10*3/uL — ABNORMAL LOW (ref 1.7–7.7)
Neutrophils Relative %: 30 %
Platelets: 136 10*3/uL — ABNORMAL LOW (ref 150–400)
RBC: 3.78 MIL/uL — ABNORMAL LOW (ref 3.87–5.11)
RDW: 16 % — ABNORMAL HIGH (ref 11.5–15.5)
WBC: 3.5 10*3/uL — ABNORMAL LOW (ref 4.0–10.5)
nRBC: 0 % (ref 0.0–0.2)

## 2019-03-08 MED ORDER — OXYCODONE-ACETAMINOPHEN 5-325 MG PO TABS: 1.0000 | ORAL_TABLET | Freq: Four times a day (QID) | ORAL | 0 refills | Status: DC | PRN

## 2019-03-08 NOTE — Assessment & Plan Note (Signed)
She has chronic back pain from her disease I recommend her to continue on MS Contin twice a day along with oxycodone as needed for breakthrough pain I refilled her prescription of Percocet as requested

## 2019-03-08 NOTE — Progress Notes (Signed)
[facility] PROGRESS NOTE  Patient Care Team: Susy Frizzle, MD as PCP - General (Family Medicine)  ASSESSMENT & PLAN:  Multiple myeloma in relapse [facility]) Repeat myeloma panel showed no detectable M protein.  Serum light chains are still elevated She appears to tolerate last week treatment well She tolerated modified regimen well She will continue her treatment with Pomalyst at 2 mg for 14 days, rest 7 days, each cycle every 21 days She will continue calcium with vitamin D along with Zometa for bone health She will continue acyclovir for antimicrobial prophylaxis and aspirin for DVT prophylaxis She will continue on reduced dose dexamethasone at 0.5 mg daily  Acquired pancytopenia ([place]) She has intermittent pancytopenia Overall, she is not symptomatic We will proceed with treatment as outlined  Cancer associated pain She has chronic back pain from her disease I recommend her to continue on MS Contin twice a day along with oxycodone as needed for breakthrough pain I refilled her prescription of Percocet as requested   No orders of the defined types were placed in this encounter.   INTERVAL HISTORY: Please see below for problem oriented charting. She returns for further follow-up I have also reviewed the plan of care with the sister over the telephone So far, she is tolerating chemotherapy pill well without side effects She has persistent cellulitis/bruising in the right ankle but slowly improving She has intermittent pain in her back but stable No recent falls  No recent reported infection, fever or chills.  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 Revlimid was stopped in July and resumed in October, stopped due to disease progression    04/01/2016 Adverse Reaction    Treatment was placed on hold temporarily due to neutropenia    12/25/2018 -  Chemotherapy    The patient had Pomalyst and dexamethasone     REVIEW OF SYSTEMS:   Constitutional: Denies fevers, chills or abnormal weight loss Eyes: Denies blurriness of vision Ears, nose, mouth, throat, and face: Denies mucositis or sore throat Respiratory: Denies cough, dyspnea or wheezes Cardiovascular: Denies palpitation, chest discomfort or lower extremity swelling Gastrointestinal:  Denies nausea, heartburn or change in bowel habits Skin: Denies abnormal skin rashes Lymphatics: Denies new lymphadenopathy  Neurological:Denies numbness, tingling or new weaknesses Behavioral/Psych: Mood is stable, no new changes  All other systems were reviewed with the patient and are negative.  I have reviewed the past medical history, past surgical history, social history and family history with the patient and they are unchanged from previous note.  ALLERGIES:  is allergic to pravastatin and zocor [simvastatin].  MEDICATIONS:  Current Outpatient Medications  Medication Sig Dispense Refill  . acyclovir (ZOVIRAX) 400 MG tablet Take 1 tablet (400 mg total) by mouth daily. 90 tablet 11  . aspirin EC 81 MG tablet Take 81 mg by mouth daily.    . Cholecalciferol (VITAMIN D PO) Take by mouth.    Marland Kitchen  dexamethasone (DECADRON) 0.5 MG tablet Take 1 tablet (0.5 mg total) by mouth daily. 30 tablet 3  . donepezil (ARICEPT) 10 MG tablet Take 1/2 tablet daily for 2 weeks, then increase to 1 tablet daily 30 tablet 11  . lisinopril (PRINIVIL,ZESTRIL) 10 MG tablet Take 2 tablets (20 mg total) by  mouth daily. 30 tablet 11  . loperamide (IMODIUM) 2 MG capsule Take 1 capsule (2 mg total) by mouth 4 (four) times daily as needed for diarrhea or loose stools. 12 capsule 0  . morphine (MS CONTIN) 15 MG 12 hr tablet Take 1 tablet (15 mg total) by mouth every 12 (twelve) hours. 60 tablet 0  . oxyCODONE-acetaminophen (ROXICET) 5-325 MG tablet Take 1 tablet by mouth every 6 (six) hours as needed for severe pain. 90 tablet 0  . pomalidomide (POMALYST) 2 MG capsule Take 1 capsule (2 mg total) by mouth daily. Take with water on days 1-14. Repeat every 21 days. 14 capsule 0   No current facility-administered medications for this visit.     PHYSICAL EXAMINATION: ECOG PERFORMANCE STATUS: 2 - Symptomatic, <50% confined to bed  Vitals:   03/08/19 0822  BP: (!) 149/73  Pulse: 83  Resp: 17  Temp: 98.1 F (36.7 C)  SpO2: 99%   Filed Weights   03/08/19 0822  Weight: 133 lb 6.4 oz (60.5 kg)    GENERAL:alert, no distress and comfortable SKIN: skin color, texture, turgor are normal, no rashes or significant lesions.  Noted chronic cellulitic changes on her right ankle EYES: normal, Conjunctiva are pink and non-injected, sclera clear OROPHARYNX:no exudate, no erythema and lips, buccal mucosa, and tongue normal  NECK: supple, thyroid normal size, non-tender, without nodularity LYMPH:  no palpable lymphadenopathy in the cervical, axillary or inguinal LUNGS: clear to auscultation and percussion with normal breathing effort HEART: regular rate & rhythm and no murmurs with mild bilateral lower extremity edema ABDOMEN:abdomen soft, non-tender and normal bowel sounds Musculoskeletal:no cyanosis of digits and no clubbing  NEURO: alert & oriented x 3 with fluent speech, no focal motor/sensory deficits  LABORATORY DATA:  I have reviewed the data as listed    Component Value Date/Time   NA 145 03/08/2019 0804   NA 145 11/03/2017 0930   K 4.0 03/08/2019 0804   K 3.6 11/03/2017 0930   CL 108  03/08/2019 0804   CO2 24 03/08/2019 0804   CO2 27 11/03/2017 0930   GLUCOSE 88 03/08/2019 0804   GLUCOSE 112 11/03/2017 0930   BUN 11 03/08/2019 0804   BUN 11 12/08/2017 1551   BUN 13.4 11/03/2017 0930   CREATININE 0.78 03/08/2019 0804   CREATININE 0.71 07/06/2018 0912   CREATININE 0.9 11/03/2017 0930   CALCIUM 8.8 (L) 03/08/2019 0804   CALCIUM 9.4 11/03/2017 0930   PROT 6.4 (L) 03/08/2019 0804   PROT 6.2 11/03/2017 0930   PROT 6.9 11/03/2017 0930   ALBUMIN 3.5 03/08/2019 0804   ALBUMIN 3.2 (L) 11/03/2017 0930   AST 18 03/08/2019 0804   AST 19 07/06/2018 0912   AST 20 11/03/2017 0930   ALT 12 03/08/2019 0804   ALT 17 07/06/2018 0912   ALT 18 11/03/2017 0930   ALKPHOS 65 03/08/2019 0804   ALKPHOS 84 11/03/2017 0930   BILITOT 0.4 03/08/2019 0804   BILITOT 0.7 07/06/2018 0912   BILITOT 0.64 11/03/2017 0930   GFRNONAA >60 03/08/2019 0804   GFRNONAA >60 07/06/2018 0912   GFRNONAA 64 01/17/2016 0910   GFRAA >60 03/08/2019 0804   GFRAA >  60 07/06/2018 0912   GFRAA 74 01/17/2016 0910    No results found for: SPEP, UPEP  Lab Results  Component Value Date   WBC 3.5 (L) 03/08/2019   NEUTROABS 1.0 (L) 03/08/2019   HGB 12.4 03/08/2019   HCT 39.0 03/08/2019   MCV 103.2 (H) 03/08/2019   PLT 136 (L) 03/08/2019      Chemistry      Component Value Date/Time   NA 145 03/08/2019 0804   NA 145 11/03/2017 0930   K 4.0 03/08/2019 0804   K 3.6 11/03/2017 0930   CL 108 03/08/2019 0804   CO2 24 03/08/2019 0804   CO2 27 11/03/2017 0930   BUN 11 03/08/2019 0804   BUN 11 12/08/2017 1551   BUN 13.4 11/03/2017 0930   CREATININE 0.78 03/08/2019 0804   CREATININE 0.71 07/06/2018 0912   CREATININE 0.9 11/03/2017 0930      Component Value Date/Time   CALCIUM 8.8 (L) 03/08/2019 0804   CALCIUM 9.4 11/03/2017 0930   ALKPHOS 65 03/08/2019 0804   ALKPHOS 84 11/03/2017 0930   AST 18 03/08/2019 0804   AST 19 07/06/2018 0912   AST 20 11/03/2017 0930   ALT 12 03/08/2019 0804   ALT 17  07/06/2018 0912   ALT 18 11/03/2017 0930   BILITOT 0.4 03/08/2019 0804   BILITOT 0.7 07/06/2018 0912   BILITOT 0.64 11/03/2017 0930      All questions were answered. The patient knows to call the clinic with any problems, questions or concerns. No barriers to learning was detected.  I spent 15 minutes counseling the patient face to face. The total time spent in the appointment was 20 minutes and more than 50% was on counseling and review of test results  Heath Lark, MD 03/08/2019 9:17 AM

## 2019-03-08 NOTE — Assessment & Plan Note (Signed)
Repeat myeloma panel showed no detectable M protein.  Serum light chains are still elevated She appears to tolerate last week treatment well She tolerated modified regimen well She will continue her treatment with Pomalyst at 2 mg for 14 days, rest 7 days, each cycle every 21 days She will continue calcium with vitamin D along with Zometa for bone health She will continue acyclovir for antimicrobial prophylaxis and aspirin for DVT prophylaxis She will continue on reduced dose dexamethasone at 0.5 mg daily

## 2019-03-08 NOTE — Assessment & Plan Note (Signed)
She has intermittent pancytopenia Overall, she is not symptomatic We will proceed with treatment as outlined

## 2019-03-09 ENCOUNTER — Telehealth: Payer: Self-pay | Admitting: Hematology and Oncology

## 2019-03-09 LAB — MULTIPLE MYELOMA PANEL, SERUM
Albumin SerPl Elph-Mcnc: 3.3 g/dL (ref 2.9–4.4)
Albumin/Glob SerPl: 1.3 (ref 0.7–1.7)
Alpha 1: 0.3 g/dL (ref 0.0–0.4)
Alpha2 Glob SerPl Elph-Mcnc: 0.8 g/dL (ref 0.4–1.0)
B-Globulin SerPl Elph-Mcnc: 1.2 g/dL (ref 0.7–1.3)
Gamma Glob SerPl Elph-Mcnc: 0.3 g/dL — ABNORMAL LOW (ref 0.4–1.8)
Globulin, Total: 2.6 g/dL (ref 2.2–3.9)
IgA: 320 mg/dL (ref 64–422)
IgG (Immunoglobin G), Serum: 385 mg/dL — ABNORMAL LOW (ref 586–1602)
IgM (Immunoglobulin M), Srm: 6 mg/dL — ABNORMAL LOW (ref 26–217)
M Protein SerPl Elph-Mcnc: 0.4 g/dL — ABNORMAL HIGH
Total Protein ELP: 5.9 g/dL — ABNORMAL LOW (ref 6.0–8.5)

## 2019-03-09 NOTE — Telephone Encounter (Signed)
Called regarding schedule °

## 2019-03-11 LAB — KAPPA/LAMBDA LIGHT CHAINS
Kappa free light chain: 565.4 mg/L — ABNORMAL HIGH (ref 3.3–19.4)
Kappa, lambda light chain ratio: 89.75 — ABNORMAL HIGH (ref 0.26–1.65)
Lambda free light chains: 6.3 mg/L (ref 5.7–26.3)

## 2019-03-15 ENCOUNTER — Other Ambulatory Visit: Payer: Self-pay | Admitting: Hematology and Oncology

## 2019-03-15 DIAGNOSIS — M48061 Spinal stenosis, lumbar region without neurogenic claudication: Secondary | ICD-10-CM

## 2019-03-15 NOTE — Telephone Encounter (Signed)
This is ready for pick up. We do not need to reorder.

## 2019-03-15 NOTE — Telephone Encounter (Signed)
ok 

## 2019-03-15 NOTE — Telephone Encounter (Signed)
I sent this prescription to [facility] last week Did the sister pick that up?

## 2019-03-15 NOTE — Telephone Encounter (Signed)
LM for sister to return call.

## 2019-03-16 MED FILL — DONEPEZIL HCL 10 MG TABLET: 10 | 30 days supply | Qty: 30 | Fill #10

## 2019-03-24 ENCOUNTER — Other Ambulatory Visit: Payer: Self-pay | Admitting: *Deleted

## 2019-03-24 DIAGNOSIS — C9002 Multiple myeloma in relapse: Secondary | ICD-10-CM

## 2019-03-24 MED ORDER — POMALIDOMIDE 2 MG PO CAPS: 2.0000 mg | ORAL_CAPSULE | Freq: Every day | ORAL | 0 refills | Status: DC

## 2019-03-28 ENCOUNTER — Other Ambulatory Visit: Payer: Self-pay | Admitting: Hematology and Oncology

## 2019-03-28 DIAGNOSIS — C9002 Multiple myeloma in relapse: Secondary | ICD-10-CM

## 2019-04-07 ENCOUNTER — Other Ambulatory Visit: Payer: Self-pay

## 2019-04-07 ENCOUNTER — Inpatient Hospital Stay: Payer: Medicare HMO | Attending: Hematology and Oncology | Admitting: Hematology and Oncology

## 2019-04-07 ENCOUNTER — Other Ambulatory Visit: Payer: Self-pay | Admitting: Hematology and Oncology

## 2019-04-07 ENCOUNTER — Inpatient Hospital Stay: Payer: Medicare HMO

## 2019-04-07 DIAGNOSIS — C9002 Multiple myeloma in relapse: Secondary | ICD-10-CM

## 2019-04-07 DIAGNOSIS — G893 Neoplasm related pain (acute) (chronic): Secondary | ICD-10-CM | POA: Insufficient documentation

## 2019-04-07 DIAGNOSIS — Z79899 Other long term (current) drug therapy: Secondary | ICD-10-CM | POA: Diagnosis not present

## 2019-04-07 DIAGNOSIS — D61818 Other pancytopenia: Secondary | ICD-10-CM | POA: Diagnosis not present

## 2019-04-07 DIAGNOSIS — M549 Dorsalgia, unspecified: Secondary | ICD-10-CM | POA: Insufficient documentation

## 2019-04-07 DIAGNOSIS — Z7982 Long term (current) use of aspirin: Secondary | ICD-10-CM | POA: Diagnosis not present

## 2019-04-07 DIAGNOSIS — F0390 Unspecified dementia without behavioral disturbance: Secondary | ICD-10-CM | POA: Diagnosis not present

## 2019-04-07 LAB — CBC WITH DIFFERENTIAL/PLATELET
Abs Immature Granulocytes: 0 10*3/uL (ref 0.00–0.07)
Basophils Absolute: 0.1 10*3/uL (ref 0.0–0.1)
Basophils Relative: 2 %
Eosinophils Absolute: 0.1 10*3/uL (ref 0.0–0.5)
Eosinophils Relative: 2 %
HCT: 38.7 % (ref 36.0–46.0)
Hemoglobin: 12.4 g/dL (ref 12.0–15.0)
Immature Granulocytes: 0 %
Lymphocytes Relative: 57 %
Lymphs Abs: 2.1 10*3/uL (ref 0.7–4.0)
MCH: 33.2 pg (ref 26.0–34.0)
MCHC: 32 g/dL (ref 30.0–36.0)
MCV: 103.8 fL — ABNORMAL HIGH (ref 80.0–100.0)
Monocytes Absolute: 0.5 10*3/uL (ref 0.1–1.0)
Monocytes Relative: 15 %
Neutro Abs: 0.9 10*3/uL — ABNORMAL LOW (ref 1.7–7.7)
Neutrophils Relative %: 24 %
Platelets: 152 10*3/uL (ref 150–400)
RBC: 3.73 MIL/uL — ABNORMAL LOW (ref 3.87–5.11)
RDW: 15.8 % — ABNORMAL HIGH (ref 11.5–15.5)
WBC: 3.6 10*3/uL — ABNORMAL LOW (ref 4.0–10.5)
nRBC: 0 % (ref 0.0–0.2)

## 2019-04-07 LAB — COMPREHENSIVE METABOLIC PANEL
ALT: 14 U/L (ref 0–44)
AST: 20 U/L (ref 15–41)
Albumin: 3.5 g/dL (ref 3.5–5.0)
Alkaline Phosphatase: 63 U/L (ref 38–126)
Anion gap: 10 (ref 5–15)
BUN: 18 mg/dL (ref 8–23)
CO2: 28 mmol/L (ref 22–32)
Calcium: 9 mg/dL (ref 8.9–10.3)
Chloride: 108 mmol/L (ref 98–111)
Creatinine, Ser: 0.72 mg/dL (ref 0.44–1.00)
GFR calc Af Amer: >60 mL/min (ref >60)
GFR calc non Af Amer: >60 mL/min (ref >60)
Glucose, Bld: 87 mg/dL (ref 70–99)
Potassium: 3.5 mmol/L (ref 3.5–5.1)
Sodium: 146 mmol/L — ABNORMAL HIGH (ref 135–145)
Total Bilirubin: 0.4 mg/dL (ref 0.3–1.2)
Total Protein: 6.4 g/dL — ABNORMAL LOW (ref 6.5–8.1)

## 2019-04-07 MED ORDER — MORPHINE SULFATE ER 15 MG PO TBCR: 15.0000 mg | EXTENDED_RELEASE_TABLET | Freq: Two times a day (BID) | ORAL | 0 refills | Status: DC

## 2019-04-07 MED FILL — DEXAMETHASONE 0.5 MG TABLET: 0.5 | 30 days supply | Qty: 30 | Fill #2

## 2019-04-07 MED FILL — MORPHINE SULF ER 15 MG TAB: 15 | 30 days supply | Qty: 60 | Fill #0

## 2019-04-08 ENCOUNTER — Encounter: Payer: Self-pay | Admitting: Hematology and Oncology

## 2019-04-08 LAB — MULTIPLE MYELOMA PANEL, SERUM
Albumin SerPl Elph-Mcnc: 3.4 g/dL (ref 2.9–4.4)
Albumin/Glob SerPl: 1.4 (ref 0.7–1.7)
Alpha 1: 0.2 g/dL (ref 0.0–0.4)
Alpha2 Glob SerPl Elph-Mcnc: 0.7 g/dL (ref 0.4–1.0)
B-Globulin SerPl Elph-Mcnc: 1.2 g/dL (ref 0.7–1.3)
Gamma Glob SerPl Elph-Mcnc: 0.3 g/dL — ABNORMAL LOW (ref 0.4–1.8)
Globulin, Total: 2.5 g/dL (ref 2.2–3.9)
IgA: 380 mg/dL (ref 64–422)
IgG (Immunoglobin G), Serum: 382 mg/dL — ABNORMAL LOW (ref 586–1602)
IgM (Immunoglobulin M), Srm: 6 mg/dL — ABNORMAL LOW (ref 26–217)
M Protein SerPl Elph-Mcnc: 0.3 g/dL — ABNORMAL HIGH
Total Protein ELP: 5.9 g/dL — ABNORMAL LOW (ref 6.0–8.5)

## 2019-04-08 LAB — KAPPA/LAMBDA LIGHT CHAINS
Kappa free light chain: 387.1 mg/L — ABNORMAL HIGH (ref 3.3–19.4)
Kappa, lambda light chain ratio: 129.03 — ABNORMAL HIGH (ref 0.26–1.65)
Lambda free light chains: 3 mg/L — ABNORMAL LOW (ref 5.7–26.3)

## 2019-04-08 NOTE — Assessment & Plan Note (Signed)
She has dementia without behavioral disturbances and is currently on treatment The decision making will fall into her sisters' decisions

## 2019-04-08 NOTE — Assessment & Plan Note (Signed)
I have verified over the phone that the patient has been taking her pomalidomide correctly Her most recent myeloma panel showed significant changes I had numerous discussion with her sister over the telephone in the past about treatment options and consideration for palliative care I will call her again next week for further follow-up once I have full myeloma panel results available

## 2019-04-08 NOTE — Assessment & Plan Note (Signed)
She has intermittent pancytopenia due to treatment She is currently not symptomatic Observe only

## 2019-04-08 NOTE — Assessment & Plan Note (Signed)
She has persistent, chronic back pain She will continue prescription pain medicine

## 2019-04-08 NOTE — Progress Notes (Signed)
[place] OFFICE PROGRESS NOTE  Patient Care Team: Susy Frizzle, MD as PCP - General (Family Medicine)  ASSESSMENT & PLAN:  Multiple myeloma in relapse [facility]) I have verified over the phone that the patient has been taking her pomalidomide correctly Her most recent myeloma panel showed significant changes I had numerous discussion with her sister over the telephone in the past about treatment options and consideration for palliative care I will call her again next week for further follow-up once I have full myeloma panel results available  Acquired pancytopenia [facility]) She has intermittent pancytopenia due to treatment She is currently not symptomatic Observe only  Dementia She has dementia without behavioral disturbances and is currently on treatment The decision making will fall into her sisters' decisions  Cancer associated pain She has persistent, chronic back pain She will continue prescription pain medicine   No orders of the defined types were placed in this encounter.   INTERVAL HISTORY: Please see below for problem oriented charting. She returns for further follow-up Her sister is also available to collaborate her history over the telephone She continues to have chronic, stable back pain No recent falls Denies recent infection, fever or chills  SUMMARY OF ONCOLOGIC HISTORY:   Multiple myeloma in relapse ([place])   01/09/2016 Imaging    MRI back showed Interval development of diffuse bone marrow infiltration by innumerable small lesions most consistent with multiple myeloma    01/23/2016 Bone Marrow Biopsy    Accession: [id number] Bone marrow biopsy showed 51% plasma cells    01/23/2016 Imaging    Skeletal survey showed lytic lesions    01/23/2016 Pathology Results    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 Revlimid was stopped in July and resumed in  October, stopped due to disease progression    04/01/2016 Adverse Reaction    Treatment was placed on hold temporarily due to neutropenia    12/25/2018 -  Chemotherapy    The patient had Pomalyst and dexamethasone     REVIEW OF SYSTEMS:   Constitutional: Denies fevers, chills or abnormal weight loss Eyes: Denies blurriness of vision Ears, nose, mouth, throat, and face: Denies mucositis or sore throat Respiratory: Denies cough, dyspnea or wheezes Cardiovascular: Denies palpitation, chest discomfort or lower extremity swelling Gastrointestinal:  Denies nausea, heartburn or change in bowel habits Skin: Denies abnormal skin rashes Lymphatics: Denies new lymphadenopathy or easy bruising Neurological:Denies numbness, tingling or new weaknesses Behavioral/Psych: Mood is stable, no new changes  All other systems were reviewed with the patient and are negative.  I have reviewed the past medical history, past surgical history, social history and family history with the patient and they are unchanged from previous note.  ALLERGIES:  is allergic to pravastatin and zocor [simvastatin].  MEDICATIONS:  Current Outpatient Medications  Medication Sig Dispense Refill  . acyclovir (ZOVIRAX) 400 MG tablet Take 1 tablet (400 mg total) by mouth daily. 90 tablet 11  . aspirin EC 81 MG tablet Take 81 mg by mouth daily.    . Cholecalciferol (VITAMIN D PO) Take by mouth.    . dexamethasone (DECADRON) 0.5 MG tablet Take 1 tablet (0.5 mg total) by mouth daily. 30 tablet 3  . donepezil (ARICEPT) 10 MG tablet Take 1/2 tablet daily for 2 weeks, then increase to 1 tablet daily 30 tablet 11  . lisinopril (PRINIVIL,ZESTRIL) 10 MG tablet Take 2 tablets (20  mg total) by mouth daily. 30 tablet 11  . loperamide (IMODIUM) 2 MG capsule Take 1 capsule (2 mg total) by mouth 4 (four) times daily as needed for diarrhea or loose stools. 12 capsule 0  . morphine (MS CONTIN) 15 MG 12 hr tablet Take 1 tablet (15 mg total) by  mouth every 12 (twelve) hours. 60 tablet 0  . oxyCODONE-acetaminophen (ROXICET) 5-325 MG tablet Take 1 tablet by mouth every 6 (six) hours as needed for severe pain. 90 tablet 0  . pomalidomide (POMALYST) 2 MG capsule Take 1 capsule (2 mg total) by mouth daily. Take with water on days 1-14. Repeat every 21 days. 14 capsule 0   No current facility-administered medications for this visit.     PHYSICAL EXAMINATION: ECOG PERFORMANCE STATUS: 1 - Symptomatic but completely ambulatory  Vitals:   04/07/19 1009  BP: (!) 143/88  Pulse: 91  Resp: 17  Temp: 98.2 F (36.8 C)  SpO2: 99%   Filed Weights   04/07/19 1009  Weight: 135 lb 12.8 oz (61.6 kg)    GENERAL:alert, no distress and comfortable SKIN: skin color, texture, turgor are normal, no rashes or significant lesions EYES: normal, Conjunctiva are pink and non-injected, sclera clear OROPHARYNX:no exudate, no erythema and lips, buccal mucosa, and tongue normal  NECK: supple, thyroid normal size, non-tender, without nodularity LYMPH:  no palpable lymphadenopathy in the cervical, axillary or inguinal LUNGS: clear to auscultation and percussion with normal breathing effort HEART: regular rate & rhythm and no murmurs and no lower extremity edema ABDOMEN:abdomen soft, non-tender and normal bowel sounds Musculoskeletal:no cyanosis of digits and no clubbing  NEURO: alert with fluent speech, no focal motor/sensory deficits  LABORATORY DATA:  I have reviewed the data as listed    Component Value Date/Time   NA 146 (H) 04/07/2019 0940   NA 145 11/03/2017 0930   K 3.5 04/07/2019 0940   K 3.6 11/03/2017 0930   CL 108 04/07/2019 0940   CO2 28 04/07/2019 0940   CO2 27 11/03/2017 0930   GLUCOSE 87 04/07/2019 0940   GLUCOSE 112 11/03/2017 0930   BUN 18 04/07/2019 0940   BUN 11 12/08/2017 1551   BUN 13.4 11/03/2017 0930   CREATININE 0.72 04/07/2019 0940   CREATININE 0.71 07/06/2018 0912   CREATININE 0.9 11/03/2017 0930   CALCIUM 9.0  04/07/2019 0940   CALCIUM 9.4 11/03/2017 0930   PROT 6.4 (L) 04/07/2019 0940   PROT 6.2 11/03/2017 0930   PROT 6.9 11/03/2017 0930   ALBUMIN 3.5 04/07/2019 0940   ALBUMIN 3.2 (L) 11/03/2017 0930   AST 20 04/07/2019 0940   AST 19 07/06/2018 0912   AST 20 11/03/2017 0930   ALT 14 04/07/2019 0940   ALT 17 07/06/2018 0912   ALT 18 11/03/2017 0930   ALKPHOS 63 04/07/2019 0940   ALKPHOS 84 11/03/2017 0930   BILITOT 0.4 04/07/2019 0940   BILITOT 0.7 07/06/2018 0912   BILITOT 0.64 11/03/2017 0930   GFRNONAA >60 04/07/2019 0940   GFRNONAA >60 07/06/2018 0912   GFRNONAA 64 01/17/2016 0910   GFRAA >60 04/07/2019 0940   GFRAA >60 07/06/2018 0912   GFRAA 74 01/17/2016 0910    No results found for: SPEP, UPEP  Lab Results  Component Value Date   WBC 3.6 (L) 04/07/2019   NEUTROABS 0.9 (L) 04/07/2019   HGB 12.4 04/07/2019   HCT 38.7 04/07/2019   MCV 103.8 (H) 04/07/2019   PLT 152 04/07/2019      Chemistry  Component Value Date/Time   NA 146 (H) 04/07/2019 0940   NA 145 11/03/2017 0930   K 3.5 04/07/2019 0940   K 3.6 11/03/2017 0930   CL 108 04/07/2019 0940   CO2 28 04/07/2019 0940   CO2 27 11/03/2017 0930   BUN 18 04/07/2019 0940   BUN 11 12/08/2017 1551   BUN 13.4 11/03/2017 0930   CREATININE 0.72 04/07/2019 0940   CREATININE 0.71 07/06/2018 0912   CREATININE 0.9 11/03/2017 0930      Component Value Date/Time   CALCIUM 9.0 04/07/2019 0940   CALCIUM 9.4 11/03/2017 0930   ALKPHOS 63 04/07/2019 0940   ALKPHOS 84 11/03/2017 0930   AST 20 04/07/2019 0940   AST 19 07/06/2018 0912   AST 20 11/03/2017 0930   ALT 14 04/07/2019 0940   ALT 17 07/06/2018 0912   ALT 18 11/03/2017 0930   BILITOT 0.4 04/07/2019 0940   BILITOT 0.7 07/06/2018 0912   BILITOT 0.64 11/03/2017 0930      All questions were answered. The patient knows to call the clinic with any problems, questions or concerns. No barriers to learning was detected.  I spent 15 minutes counseling the patient  face to face. The total time spent in the appointment was 20 minutes and more than 50% was on counseling and review of test results  Heath Lark, MD 04/08/2019 9:28 AM

## 2019-04-12 ENCOUNTER — Other Ambulatory Visit: Payer: Self-pay

## 2019-04-12 DIAGNOSIS — C9002 Multiple myeloma in relapse: Secondary | ICD-10-CM

## 2019-04-12 MED ORDER — POMALIDOMIDE 2 MG PO CAPS: 2.0000 mg | ORAL_CAPSULE | Freq: Every day | ORAL | 0 refills | Status: DC

## 2019-04-13 ENCOUNTER — Telehealth: Payer: Self-pay

## 2019-04-13 ENCOUNTER — Telehealth: Payer: Self-pay | Admitting: Hematology and Oncology

## 2019-04-13 ENCOUNTER — Other Ambulatory Visit: Payer: Self-pay | Admitting: Hematology and Oncology

## 2019-04-13 DIAGNOSIS — C9002 Multiple myeloma in relapse: Secondary | ICD-10-CM

## 2019-04-13 NOTE — Telephone Encounter (Signed)
-----   Message from Heath Lark, MD sent at 04/13/2019  8:26 AM EDT ----- Regarding: refer to [facility],  Call sister Suszanne Conners for referral Stop Pomalyst

## 2019-04-13 NOTE — Telephone Encounter (Signed)
I spoke with her sister and reviewed recent myeloma panel She has disease progression Due to her dementia and weakness, I recommend we discontinue treatment and focus on palliative care and hospice I will get her set up to be referred for home-based palliative care/hospice for now I will continue her pain management

## 2019-04-13 NOTE — Telephone Encounter (Signed)
[facility] Palliative care with referral. Kimberly Friedman verbalized understanding. Given Kimberly Friedman contact numbers.

## 2019-04-15 ENCOUNTER — Telehealth: Payer: Self-pay | Admitting: Hematology and Oncology

## 2019-04-15 NOTE — Telephone Encounter (Signed)
I spoke with her sister, Suszanne Conners over the telephone She has not discussed the plan of care with other family members She has not set up appointment to meet with palliative care/hospice We discussed the natural process of dying We discussed the rationale of stopping chemotherapy We discussed the goal is to preserve dignity of life in a compassionate fashion which I think can be provided through palliative care/hospice rather than aggressive treatment and intervention Her sister is concerned about letting go means when not treating her as aggressively as possible We discussed future follow-up For now, she does not have return appointment to see me back If her sister declined palliative care, she will need long-term follow-up with her primary care physician For now, I encouraged her to make contact with [facility] (she was contacted and have the number to call back) to discuss services that is available and then decide what she wants to do next.

## 2019-04-18 ENCOUNTER — Encounter: Payer: Self-pay | Admitting: Hematology and Oncology

## 2019-04-18 DIAGNOSIS — C9002 Multiple myeloma in relapse: Secondary | ICD-10-CM | POA: Diagnosis not present

## 2019-04-19 DIAGNOSIS — C9002 Multiple myeloma in relapse: Secondary | ICD-10-CM | POA: Diagnosis not present

## 2019-04-19 MED FILL — DONEPEZIL HCL 10 MG TABLET: 10 | 30 days supply | Qty: 30 | Fill #11

## 2019-04-19 MED FILL — ACYCLOVIR 400 MG TABLET: 400 | 90 days supply | Qty: 90 | Fill #1

## 2019-04-20 ENCOUNTER — Telehealth: Payer: Self-pay | Admitting: *Deleted

## 2019-04-20 ENCOUNTER — Telehealth: Payer: Self-pay | Admitting: Neurology

## 2019-04-20 DIAGNOSIS — C9002 Multiple myeloma in relapse: Secondary | ICD-10-CM | POA: Diagnosis not present

## 2019-04-20 NOTE — Telephone Encounter (Signed)
Talked to her sister she said that the fall was sat. And that the weekend was bad each day she was getting better, now pt is back at her base line, pt sister is on the way to go see Kimberly Friedman  Now and will call us back tomorrow to let us know how she is doing.  Pt sister stated that she had fallen sat morning has burse above her eye and her shoulder, pt sister stated that she does not want to take her to the ER if she does not have to, she was informed that if pt is not at her normal base line she needs to take her to the ER for an evaluation of the fall, pt sister stated that pt does not remember falling, Pt sister agreed that if pt started acting different than base line she would take her to the ER

## 2019-04-20 NOTE — Telephone Encounter (Signed)
Can you pls check and see if patient is acutely changed, needs ER eval, head CT. Thanks

## 2019-04-20 NOTE — Telephone Encounter (Signed)
[facility] and Palliative care called to notify this office that she has been enrolled. She will have weekly visits for follow up. Patient is need of a wheeled walker and a 3'n'1 bedside commode. The nurse is asking if this prescription can be faxed to [facility].

## 2019-04-20 NOTE — Telephone Encounter (Signed)
Patient's sister called regarding Kimberly Friedman needing an appointment. She fell and hit her head and now she is talking very strange. She left a message needing an appointment. She is scheduled 06/17/19. Does she need to be seen sooner? Please Advise. Thanks

## 2019-04-20 NOTE — Telephone Encounter (Signed)
Talked to her sister she said that the fall was sat. And that the weekend was bad each day she was getting better, now pt is back at her base line, pt sister is on the way to go see Mrs. Odie  Now and will call us back tomorrow to let us know how she is doing.  Pt sister stated that she had fallen sat morning has burse above her eye and her shoulder, pt sister stated that she does not want to take her to the ER if she does not have to, she was informed that if pt is not at her normal base line she needs to take her to the ER for an evaluation of the fall, pt sister stated that pt does not remember falling, Pt sister agreed that if pt started acting different than base line she would take her to the ER

## 2019-04-21 DIAGNOSIS — C9002 Multiple myeloma in relapse: Secondary | ICD-10-CM | POA: Diagnosis not present

## 2019-04-21 NOTE — Telephone Encounter (Signed)
Can they take a verbal order or do we need to write out on a paper script

## 2019-04-22 DIAGNOSIS — C9002 Multiple myeloma in relapse: Secondary | ICD-10-CM | POA: Diagnosis not present

## 2019-04-22 NOTE — Telephone Encounter (Signed)
Script faxed to [facility]

## 2019-04-23 DIAGNOSIS — C9002 Multiple myeloma in relapse: Secondary | ICD-10-CM | POA: Diagnosis not present

## 2019-04-24 DIAGNOSIS — C9002 Multiple myeloma in relapse: Secondary | ICD-10-CM | POA: Diagnosis not present

## 2019-04-25 DIAGNOSIS — C9002 Multiple myeloma in relapse: Secondary | ICD-10-CM | POA: Diagnosis not present

## 2019-04-25 DIAGNOSIS — F0390 Unspecified dementia without behavioral disturbance: Secondary | ICD-10-CM | POA: Diagnosis not present

## 2019-04-26 DIAGNOSIS — C9002 Multiple myeloma in relapse: Secondary | ICD-10-CM | POA: Diagnosis not present

## 2019-04-26 NOTE — Telephone Encounter (Signed)
Yes keep July appointment, thanks

## 2019-04-26 NOTE — Telephone Encounter (Signed)
Spoke with pt sister, Mrs. Raftery does not remember the fall, pt has not been feeling good, N/V, pt sister stated that she is not eating much she says that she is hungry but when they give her food she plays with it. Pt sister stated that she knows everyone, sister did say that she got a potty chair and walker to have at home,

## 2019-04-26 NOTE — Telephone Encounter (Signed)
Please let sister know to have patient evaluated by her PCP for the nausea/vomiting, feeling unwell. Thanks

## 2019-04-26 NOTE — Telephone Encounter (Signed)
Pls follow-up with sister, thanks!

## 2019-04-26 NOTE — Telephone Encounter (Signed)
Spoke with pt sister Mrs Kimberly Friedman is now on palative care, her CA treatments are no longer working, she is with [facility] she has a Marine scientist that comes to the house, nurse will be out tomorrow, pt sister asking if they need to keep July appointment,

## 2019-04-27 ENCOUNTER — Telehealth: Payer: Self-pay

## 2019-04-27 DIAGNOSIS — C9002 Multiple myeloma in relapse: Secondary | ICD-10-CM | POA: Diagnosis not present

## 2019-04-27 NOTE — Telephone Encounter (Signed)
Nurse with [facility] called and left a message. She saw Kimberly Friedman today in the home. She is having alot of possible sputum in throat/ congestion with spitting up. Possibly scid reflux. Should she start on something for acid reflux? She already takes Claritin for allergies.  She also has been having hallucinations for 2 weeks and seeing bugs.

## 2019-04-28 DIAGNOSIS — C9002 Multiple myeloma in relapse: Secondary | ICD-10-CM | POA: Diagnosis not present

## 2019-04-28 NOTE — Telephone Encounter (Signed)
Called and given below message to Linus Orn, nurse with [facility]. She verbalized understanding. Social worker was in the home yesterday also. She will talk with family.

## 2019-04-28 NOTE — Telephone Encounter (Signed)
Tums or Maalox are ok She should probably move to residential facility with confusion I am not sure if she is safe by herself Does palliative care service as social worker?

## 2019-04-29 DIAGNOSIS — C9002 Multiple myeloma in relapse: Secondary | ICD-10-CM | POA: Diagnosis not present

## 2019-04-30 DIAGNOSIS — C9002 Multiple myeloma in relapse: Secondary | ICD-10-CM | POA: Diagnosis not present

## 2019-05-01 DIAGNOSIS — C9002 Multiple myeloma in relapse: Secondary | ICD-10-CM | POA: Diagnosis not present

## 2019-05-10 MED FILL — DEXAMETHASONE 0.5 MG TABLET: 0.5 | 30 days supply | Qty: 30 | Fill #3

## 2019-05-16 DIAGNOSIS — C9002 Multiple myeloma in relapse: Secondary | ICD-10-CM | POA: Diagnosis not present

## 2019-05-17 DIAGNOSIS — C9002 Multiple myeloma in relapse: Secondary | ICD-10-CM | POA: Diagnosis not present

## 2019-05-18 DIAGNOSIS — C9002 Multiple myeloma in relapse: Secondary | ICD-10-CM | POA: Diagnosis not present

## 2019-05-19 DIAGNOSIS — C9002 Multiple myeloma in relapse: Secondary | ICD-10-CM | POA: Diagnosis not present

## 2019-05-20 DIAGNOSIS — C9002 Multiple myeloma in relapse: Secondary | ICD-10-CM | POA: Diagnosis not present

## 2019-05-21 DIAGNOSIS — C9002 Multiple myeloma in relapse: Secondary | ICD-10-CM | POA: Diagnosis not present

## 2019-05-22 DIAGNOSIS — C9002 Multiple myeloma in relapse: Secondary | ICD-10-CM | POA: Diagnosis not present

## 2019-05-23 DIAGNOSIS — C9002 Multiple myeloma in relapse: Secondary | ICD-10-CM | POA: Diagnosis not present

## 2019-05-24 DIAGNOSIS — C9002 Multiple myeloma in relapse: Secondary | ICD-10-CM | POA: Diagnosis not present

## 2019-05-25 DIAGNOSIS — C9002 Multiple myeloma in relapse: Secondary | ICD-10-CM | POA: Diagnosis not present

## 2019-05-26 DIAGNOSIS — C9002 Multiple myeloma in relapse: Secondary | ICD-10-CM | POA: Diagnosis not present

## 2019-05-27 DIAGNOSIS — C9002 Multiple myeloma in relapse: Secondary | ICD-10-CM | POA: Diagnosis not present

## 2019-05-28 DIAGNOSIS — C9002 Multiple myeloma in relapse: Secondary | ICD-10-CM | POA: Diagnosis not present

## 2019-05-29 DIAGNOSIS — C9002 Multiple myeloma in relapse: Secondary | ICD-10-CM | POA: Diagnosis not present

## 2019-05-30 ENCOUNTER — Other Ambulatory Visit: Payer: Self-pay

## 2019-05-30 ENCOUNTER — Telehealth: Payer: Self-pay | Admitting: Neurology

## 2019-05-30 MED ORDER — DONEPEZIL HCL 10 MG PO TABS: ORAL_TABLET | ORAL | 3 refills | Status: DC

## 2019-05-30 NOTE — Telephone Encounter (Signed)
Pt is scheduled with you 06/17/19 for a telephone visit. Are you ok with refilling Aricept? Per last OV note pt is on Palliative care for Mult. Myeloma. Pt has not been seen in just over a year.

## 2019-05-30 NOTE — Telephone Encounter (Signed)
Aricept sent to [facility]. Pts sister informed.

## 2019-05-30 NOTE — Telephone Encounter (Signed)
Pt sister states patient is out of the Aricept  She did nto know if Dr Delice Lesch still wanted her on that if so please call in to the [facility] out patient pharmacy

## 2019-05-30 NOTE — Telephone Encounter (Signed)
Ok to refill Aricept, thanks

## 2019-05-31 MED FILL — DONEPEZIL HCL 10 MG TABLET: 10 | 37 days supply | Qty: 30 | Fill #0

## 2019-06-07 ENCOUNTER — Other Ambulatory Visit: Payer: Self-pay | Admitting: Hematology and Oncology

## 2019-06-07 ENCOUNTER — Telehealth: Payer: Self-pay

## 2019-06-07 DIAGNOSIS — M48061 Spinal stenosis, lumbar region without neurogenic claudication: Secondary | ICD-10-CM

## 2019-06-07 MED FILL — LISINOPRIL 20 MG TABLET: 20 | 90 days supply | Qty: 90 | Fill #2

## 2019-06-07 MED FILL — MORPHINE SULF ER 15 MG TAB: 15 | 30 days supply | Qty: 60 | Fill #0

## 2019-06-07 MED FILL — DEXAMETHASONE 0.5 MG TABLET: 0.5 | 30 days supply | Qty: 30 | Fill #0

## 2019-06-07 MED FILL — OXYCODONE-ACETAMINOPHEN 5-3: 5-325 | 22 days supply | Qty: 90 | Fill #0

## 2019-06-07 NOTE — Telephone Encounter (Signed)
Spoke with pt's sister, Teressa Senter.  She states pt is doing well and "actually seems to feel a little bit better".   Teressa Senter feels that the current pain med regimen is controlling pt's pain adequately. Advised her to call for any further needs.

## 2019-06-07 NOTE — Telephone Encounter (Signed)
-----   Message from Heath Lark, MD sent at 06/07/2019  2:32 PM EDT ----- Regarding: can you call sister She was enrolled in hospice recently I received multiple refill requests How is she doing? Is the pain medication adequate to control her pain right now?

## 2019-06-07 NOTE — Telephone Encounter (Signed)
I authorized all refills to [facility] and signed them

## 2019-06-17 ENCOUNTER — Encounter

## 2019-06-17 ENCOUNTER — Encounter: Payer: Self-pay | Admitting: Neurology

## 2019-06-17 ENCOUNTER — Other Ambulatory Visit: Payer: Self-pay

## 2019-06-17 ENCOUNTER — Telehealth (INDEPENDENT_AMBULATORY_CARE_PROVIDER_SITE_OTHER): Payer: Medicare HMO | Admitting: Neurology

## 2019-06-17 VITALS — Ht 62.0 in | Wt 126.0 lb

## 2019-06-17 DIAGNOSIS — F03B0 Unspecified dementia, moderate, without behavioral disturbance, psychotic disturbance, mood disturbance, and anxiety: Secondary | ICD-10-CM

## 2019-06-17 DIAGNOSIS — F0390 Unspecified dementia without behavioral disturbance: Secondary | ICD-10-CM | POA: Diagnosis not present

## 2019-06-17 MED ORDER — DONEPEZIL HCL 10 MG PO TABS: ORAL_TABLET | ORAL | 3 refills | Status: AC
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
## 2019-07-05 MED FILL — DEXAMETHASONE 0.5 MG TABLET: 0.5 | 30 days supply | Qty: 30 | Fill #1

## 2019-07-05 MED FILL — DONEPEZIL HCL 10 MG TABLET: 10 | 37 days supply | Qty: 30 | Fill #1

## 2019-07-17 MED FILL — ACYCLOVIR 400 MG TABLET: 400 | 90 days supply | Qty: 90 | Fill #2

## 2019-08-04 MED FILL — DEXAMETHASONE 0.5 MG TABLET: 0.5 | 30 days supply | Qty: 30 | Fill #2

## 2019-08-05 ENCOUNTER — Other Ambulatory Visit: Payer: Self-pay | Admitting: Hematology and Oncology

## 2019-08-05 ENCOUNTER — Telehealth: Payer: Self-pay | Admitting: *Deleted

## 2019-08-05 DIAGNOSIS — M48061 Spinal stenosis, lumbar region without neurogenic claudication: Secondary | ICD-10-CM

## 2019-08-05 MED ORDER — OXYCODONE-ACETAMINOPHEN 5-325 MG PO TABS: ORAL_TABLET | ORAL | 0 refills | Status: DC

## 2019-08-05 MED ORDER — MORPHINE SULFATE ER 15 MG PO TBCR: EXTENDED_RELEASE_TABLET | ORAL | 0 refills | Status: DC

## 2019-08-05 MED FILL — DONEPEZIL HCL 10 MG TABLET: 10 | 37 days supply | Qty: 30 | Fill #2

## 2019-08-05 MED FILL — OXYCODONE-ACETAMINOPHEN 5-3: 5-325 | 23 days supply | Qty: 90 | Fill #0

## 2019-08-05 MED FILL — MORPHINE SULF ER 15 MG TAB: 15 | 30 days supply | Qty: 60 | Fill #0

## 2019-08-05 NOTE — Telephone Encounter (Signed)
done

## 2019-08-05 NOTE — Telephone Encounter (Signed)
Patient needs a refill of pain medication sent to [facility].

## 2019-09-04 MED FILL — DEXAMETHASONE 0.5 MG TABLET: 0.5 | 30 days supply | Qty: 30 | Fill #3

## 2019-09-04 MED FILL — LISINOPRIL 20 MG TABLET: 20 | 90 days supply | Qty: 90 | Fill #3

## 2019-09-05 MED FILL — DONEPEZIL HCL 10 MG TABLET: 10 | 37 days supply | Qty: 30 | Fill #3

## 2019-10-04 ENCOUNTER — Telehealth: Payer: Self-pay

## 2019-10-04 NOTE — Telephone Encounter (Signed)
Sister called to give update. Ms. Buhman lost some weight & got down to 123 lbs and now is back up to 127 lbs. She having a little more pain when she gets up and down. Still likes to try to do things for herself. Dementia is getting worse. Hospice is still coming out and are a great help.  Requesting refills sent to [facility] outpatient pharmacy. Needs Morphine, Oxycodone and Dexamethasone.

## 2019-10-05 ENCOUNTER — Other Ambulatory Visit: Payer: Self-pay | Admitting: Hematology and Oncology

## 2019-10-05 DIAGNOSIS — M48061 Spinal stenosis, lumbar region without neurogenic claudication: Secondary | ICD-10-CM

## 2019-10-05 MED ORDER — OXYCODONE-ACETAMINOPHEN 5-325 MG PO TABS: ORAL_TABLET | ORAL | 0 refills | Status: AC

## 2019-10-05 MED ORDER — MORPHINE SULFATE ER 15 MG PO TBCR: EXTENDED_RELEASE_TABLET | ORAL | 0 refills | Status: DC

## 2019-10-05 MED ORDER — DEXAMETHASONE 0.5 MG PO TABS: 0.5000 mg | ORAL_TABLET | Freq: Every day | ORAL | 3 refills | Status: AC

## 2019-10-05 MED FILL — DONEPEZIL HCL 10 MG TABLET: 10 | 90 days supply | Qty: 90 | Fill #0

## 2019-10-05 MED FILL — MORPHINE SULF ER 15 MG TAB: 15 | 30 days supply | Qty: 60 | Fill #0

## 2019-10-05 MED FILL — DEXAMETHASONE 0.5 MG TABLET: 0.5 | 30 days supply | Qty: 30 | Fill #0

## 2019-10-05 MED FILL — OXYCODONE-ACETAMINOPHEN 5-3: 5-325 | 23 days supply | Qty: 90 | Fill #0

## 2019-10-05 NOTE — Telephone Encounter (Signed)
Called and given below message. She verbalized understanding. 

## 2019-10-05 NOTE — Telephone Encounter (Signed)
Prescriptions sent to [facility] Thanks for the update

## 2019-10-08 MED FILL — ACYCLOVIR 400 MG TABLET: 400 | 90 days supply | Qty: 90 | Fill #3

## 2019-11-04 MED FILL — DEXAMETHASONE 0.5 MG TABLET: 0.5 | 30 days supply | Qty: 30 | Fill #1

## 2019-11-29 ENCOUNTER — Telehealth: Payer: Self-pay

## 2019-11-29 NOTE — Telephone Encounter (Signed)
Kimberly Friedman, nurse with [facility] called and left a message. She is calling to give a update. Kimberly Friedman is having mover difficulty getting up and down from a sitting position. She has pain, discomfort and weakness getting up and down. When she gets up and down, or moves around she has creaking and grinding noises that comes from her hips, back and legs.  Kimberly Friedman is asking is it possible to order a x-ray? [name] sister is asking.

## 2019-11-30 NOTE — Telephone Encounter (Signed)
I do not believe X ray will change management If she wants it she can get her primary doctor to order it

## 2019-11-30 NOTE — Telephone Encounter (Signed)
Called and left below message on voicemail. Ask Linus Orn to call the office if needed.

## 2019-12-01 ENCOUNTER — Other Ambulatory Visit: Payer: Self-pay | Admitting: Hematology and Oncology

## 2019-12-01 MED FILL — DEXAMETHASONE 0.5 MG TABLET: 0.5 | 30 days supply | Qty: 30 | Fill #2

## 2019-12-05 ENCOUNTER — Other Ambulatory Visit: Payer: Self-pay | Admitting: Hematology and Oncology

## 2019-12-05 ENCOUNTER — Telehealth: Payer: Self-pay | Admitting: *Deleted

## 2019-12-05 NOTE — Telephone Encounter (Signed)
Telephone call from patient needing refill of Morphine. Please send to [facility].

## 2019-12-06 ENCOUNTER — Other Ambulatory Visit: Payer: Self-pay | Admitting: Hematology and Oncology

## 2019-12-06 MED ORDER — MORPHINE SULFATE ER 15 MG PO TBCR: EXTENDED_RELEASE_TABLET | ORAL | 0 refills | Status: AC

## 2019-12-06 MED FILL — MORPHINE SULF ER 15 MG TAB: 15 | 30 days supply | Qty: 60 | Fill #0

## 2019-12-06 NOTE — Telephone Encounter (Signed)
Done Also, I have DC lisinopril and acyclovir off her med list to stop automatic refills request She does not need them anymore

## 2019-12-08 ENCOUNTER — Ambulatory Visit: Payer: Medicare HMO | Attending: Internal Medicine

## 2019-12-08 DIAGNOSIS — Z23 Encounter for immunization: Secondary | ICD-10-CM | POA: Insufficient documentation

## 2019-12-08 NOTE — Progress Notes (Signed)
   Covid-19 Vaccination Clinic  Name:  Kimberly Friedman    MRN: [id number] DOB: 12/07/1935  12/08/2019  [name] was observed post Covid-19 immunization for 15 minutes without incidence. She was provided with Vaccine Information Sheet and instruction to access the V-Safe system.   [name] was instructed to call 911 with any severe reactions post vaccine: Marland Kitchen Difficulty breathing  . Swelling of your face and throat  . A fast heartbeat  . A bad rash all over your body  . Dizziness and weakness    Immunizations Administered    Name Date Dose VIS Date Route   Pfizer COVID-19 Vaccine 12/08/2019  3:26 PM 0.3 mL 10/28/2019 Intramuscular   Manufacturer: [place]   Lot: [id number]   [place]: [id number]

## 2019-12-11 IMAGING — MR MR HEAD WO/W CM
13 series · 48 of 48 positions shown · IV contrast (13 ml multihance)
Comparison: None.

CLINICAL DATA: Memory loss dementia.  Multiple myeloma

EXAM:
MRI HEAD WITHOUT AND WITH CONTRAST
TECHNIQUE: Multiplanar, multiecho pulse sequences of the brain and surrounding
structures were obtained without and with intravenous contrast.
CONTRAST:  13 mL MultiHance IV

[Series 2: T1 · sagittal · 5.0mm · 0.45mm/px · 1 of 21 slices shown]
[im 1/21]
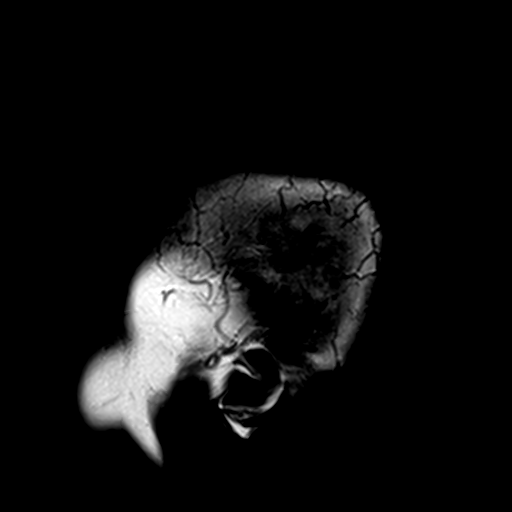

[Series 3: DWI · axial · 3.0mm · 1.80mm/px · z∈[-71,+68]mm · 7 of 100 slices shown (1 of 4)]
[im 1/100]
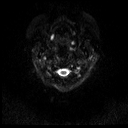
[im 17/100]
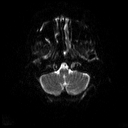
[im 34/100]
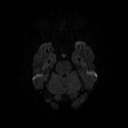
[im 50/100]
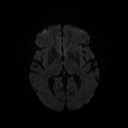
[im 67/100]
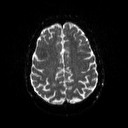
[im 83/100]
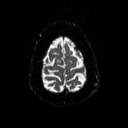
[im 100/100]
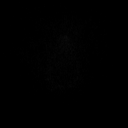

[Series 4: DWI · axial · 3.0mm · 1.80mm/px · z∈[-71,+68]mm · 3 of 43 slices shown (2 of 4)]
[im 1/43]
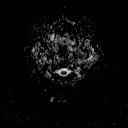
[im 22/43]
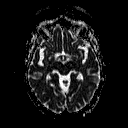
[im 43/43]
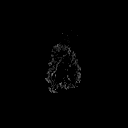

[Series 5: DWI · coronal · 5.0mm · 1.80mm/px · 5 of 67 slices shown (3 of 4)]
[im 1/67]
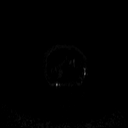
[im 17/67]
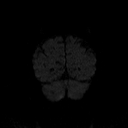
[im 34/67]
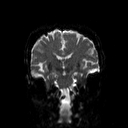
[im 50/67]
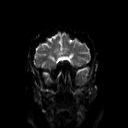
[im 67/67]
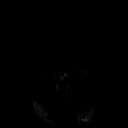

[Series 6: DWI · coronal · 5.0mm · 1.80mm/px · 2 of 34 slices shown (4 of 4)]
[im 1/34]
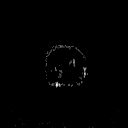
[im 34/34]
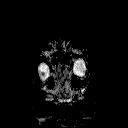

[Series 7: T2 · axial · 5.0mm · 0.51mm/px · 1 of 22 slices shown (1 of 2)]
[im 1/22]
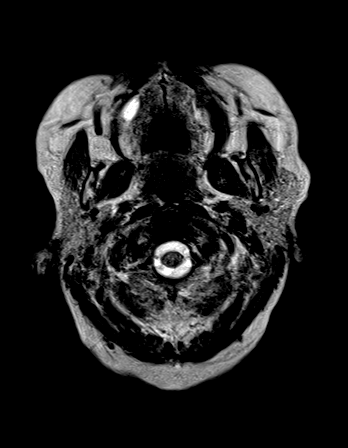

[Series 8: FLAIR · axial · 3.0mm · 0.45mm/px · z∈[-65,+63]mm · 2 of 30 slices shown]
[im 1/30]
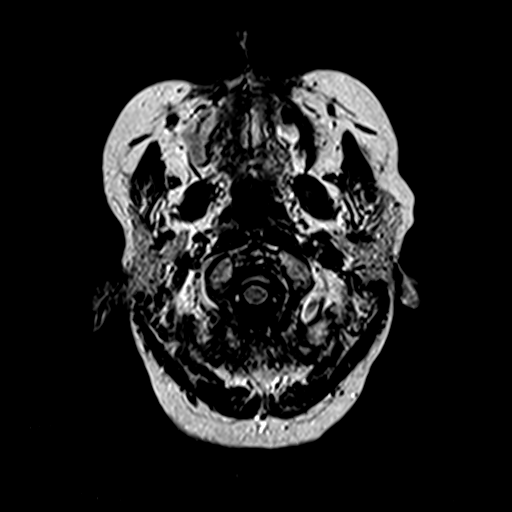
[im 30/30]
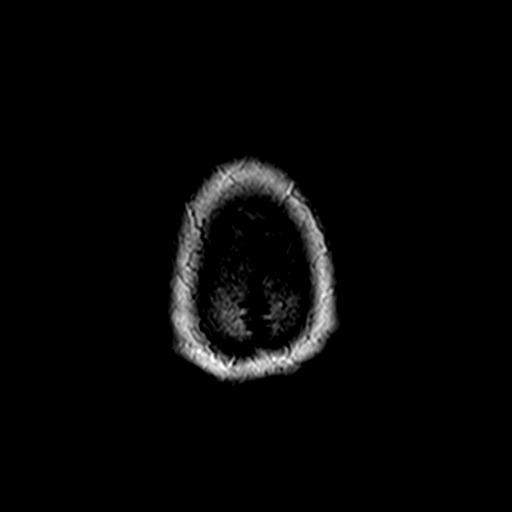

[Series 10: swi_images · axial · 5.0mm · 0.90mm/px · z∈[-70,+68]mm · 2 of 30 slices shown]
[im 1/30]
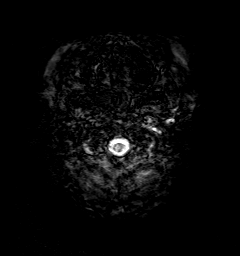
[im 30/30]
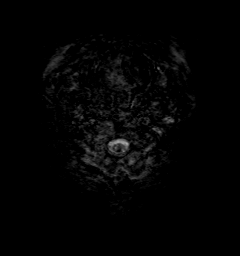

[Series 11: t1_mpr_tra · axial · 1.0mm · 0.75mm/px · z∈[-67,+69]mm · 10 of 144 slices shown (1 of 2)]
[im 1/144]
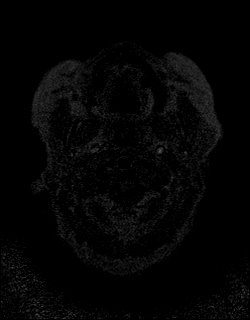
[im 16/144]
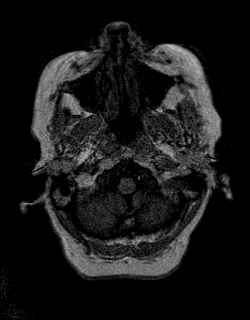
[im 32/144]
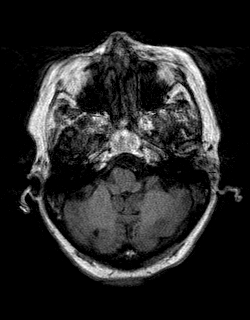
[im 48/144]
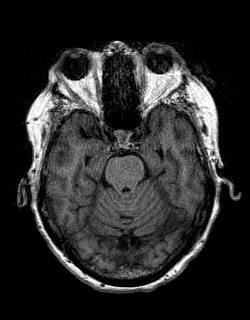
[im 64/144]
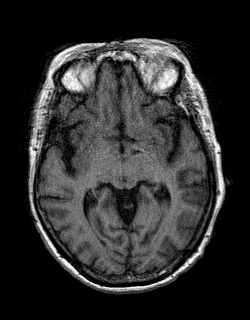
[im 80/144]
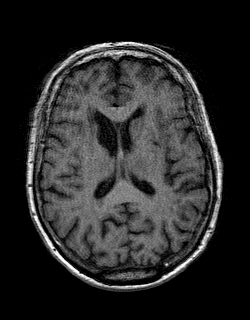
[im 96/144]
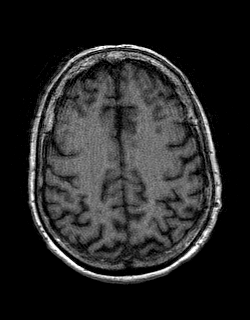
[im 112/144]
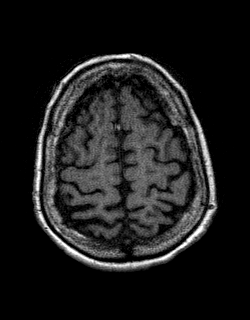
[im 128/144]
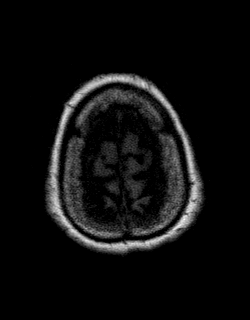
[im 144/144]
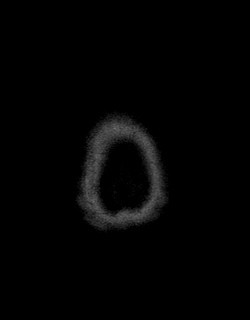

[Series 12: T2 · coronal · 5.0mm · 0.45mm/px · 2 of 25 slices shown (2 of 2)]
[im 1/25]
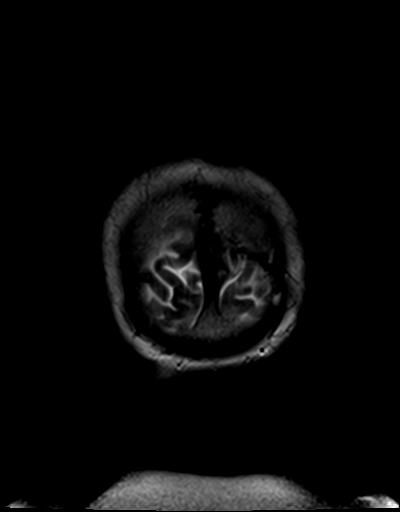
[im 25/25]
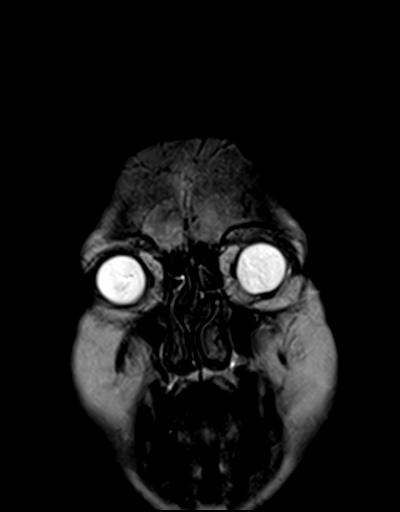

[Series 13: t1_mpr_tra · axial · 1.0mm · 0.75mm/px · z∈[-67,+69]mm · 10 of 144 slices shown (2 of 2)]
[im 1/144]
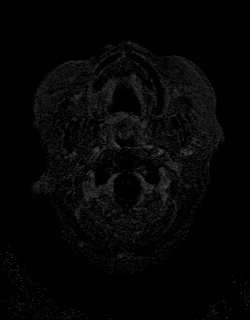
[im 16/144]
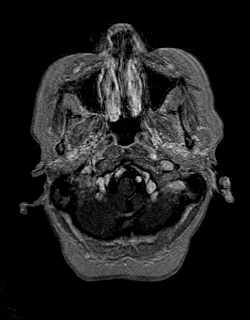
[im 32/144]
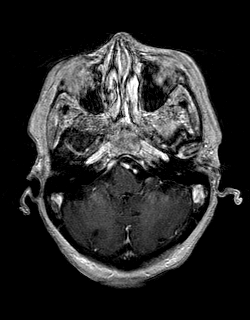
[im 48/144]
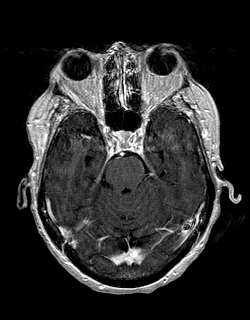
[im 64/144]
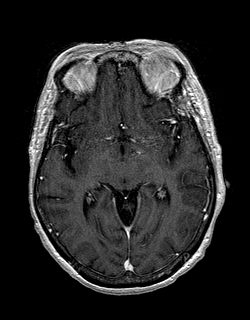
[im 80/144]
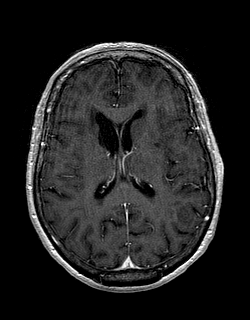
[im 96/144]
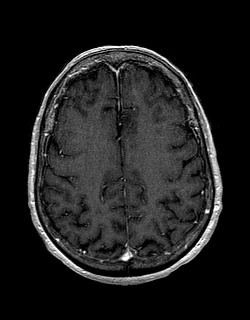
[im 112/144]
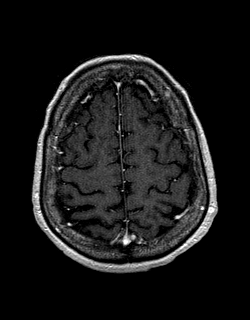
[im 128/144]
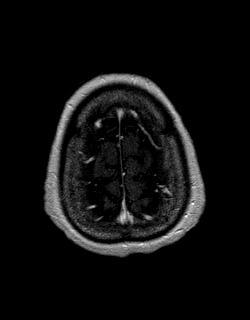
[im 144/144]
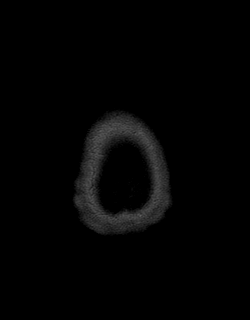

[Series 14: post cor · coronal · 5.0mm · 0.45mm/px · 2 of 25 slices shown]
[im 1/25]
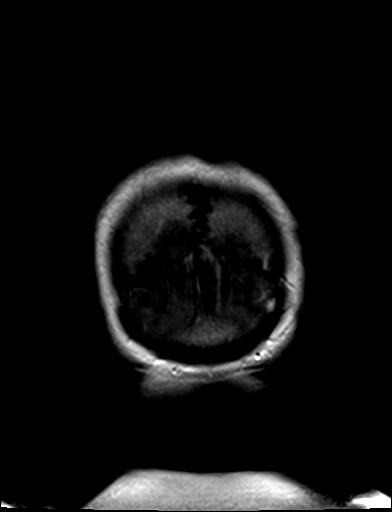
[im 25/25]
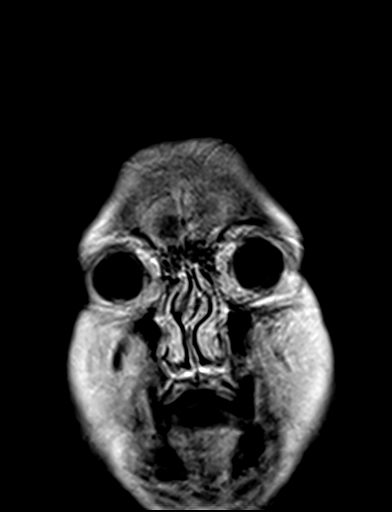

[Series 15: post sag (optional · sagittal · 5.0mm · 0.45mm/px · 1 of 21 slices shown]
[im 1/21]
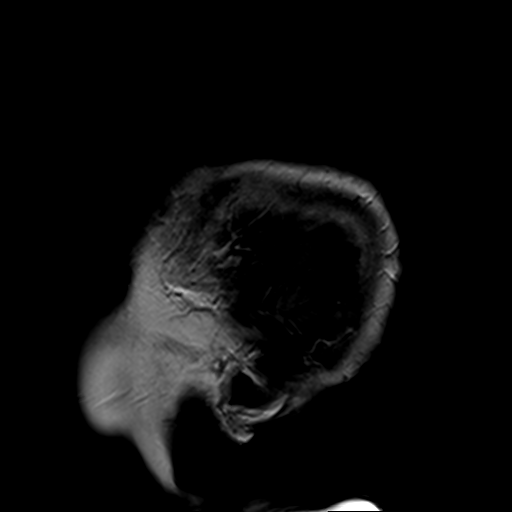

[48 of 48 positions shown; findings below may reference images not displayed]

FINDINGS: Brain: Image quality degraded by mild motion.

Ventricle size and cerebral volume normal for age. Negative for
acute infarct. Chronic ischemic changes in the white matter and
right internal capsule anteriorly. Negative for mass. Chronic
hemorrhage right internal capsule with associated volume loss and
infarction. Normal enhancement postcontrast administration.

Vascular: Normal arterial flow voids.

Skull and upper cervical spine: No worrisome skull lesions.

Sinuses/Orbits: Mucosal edema paranasal sinuses.  Normal orbit

Other: None
IMPRESSION: Cerebral volume normal for age. Negative for hydrocephalus. Chronic
hemorrhage right basal ganglia. No acute abnormality.

## 2019-12-28 ENCOUNTER — Ambulatory Visit: Payer: Medicare HMO | Admitting: Neurology

## 2019-12-29 ENCOUNTER — Ambulatory Visit: Payer: Medicare HMO | Attending: Internal Medicine

## 2019-12-29 DIAGNOSIS — Z23 Encounter for immunization: Secondary | ICD-10-CM | POA: Insufficient documentation

## 2019-12-29 NOTE — Progress Notes (Signed)
   Covid-19 Vaccination Clinic  Name:  Kimberly Friedman    MRN: [id number] DOB: 1935/12/30  12/29/2019  [name] was observed post Covid-19 immunization for 15 minutes without incidence. She was provided with Vaccine Information Sheet and instruction to access the V-Safe system.   [name] was instructed to call 911 with any severe reactions post vaccine: Marland Kitchen Difficulty breathing  . Swelling of your face and throat  . A fast heartbeat  . A bad rash all over your body  . Dizziness and weakness    Immunizations Administered    Name Date Dose VIS Date Route   Pfizer COVID-19 Vaccine 12/29/2019  4:20 PM 0.3 mL 10/28/2019 Intramuscular   Manufacturer: [place]   Lot: [id number]   [place]: [id number]

## 2020-02-16 DEATH — deceased
